# Patient Record
Sex: Male | Born: 1950
Health system: Southern US, Community
[De-identification: ages and names within clinical notes are randomized; demographics above are authoritative.]

## PROBLEM LIST (undated history)

## (undated) ENCOUNTER — Inpatient Hospital Stay: Payer: Self-pay | Admitting: Internal Medicine

## (undated) DIAGNOSIS — C61 Malignant neoplasm of prostate: Secondary | ICD-10-CM

## (undated) DIAGNOSIS — K5730 Diverticulosis of large intestine without perforation or abscess without bleeding: Secondary | ICD-10-CM

## (undated) DIAGNOSIS — C801 Malignant (primary) neoplasm, unspecified: Secondary | ICD-10-CM

## (undated) DIAGNOSIS — Z8719 Personal history of other diseases of the digestive system: Secondary | ICD-10-CM

## (undated) DIAGNOSIS — R600 Localized edema: Secondary | ICD-10-CM

## (undated) DIAGNOSIS — R1310 Dysphagia, unspecified: Secondary | ICD-10-CM

## (undated) DIAGNOSIS — Z87442 Personal history of urinary calculi: Secondary | ICD-10-CM

## (undated) DIAGNOSIS — R0600 Dyspnea, unspecified: Secondary | ICD-10-CM

## (undated) DIAGNOSIS — K829 Disease of gallbladder, unspecified: Secondary | ICD-10-CM

## (undated) DIAGNOSIS — Z86010 Personal history of colonic polyps: Secondary | ICD-10-CM

## (undated) DIAGNOSIS — G4730 Sleep apnea, unspecified: Secondary | ICD-10-CM

## (undated) DIAGNOSIS — K219 Gastro-esophageal reflux disease without esophagitis: Secondary | ICD-10-CM

## (undated) HISTORY — DX: Personal history of urinary calculi: Z87.442

## (undated) HISTORY — DX: Gastro-esophageal reflux disease without esophagitis: K21.9

## (undated) HISTORY — DX: Personal history of other diseases of the digestive system: Z87.19

## (undated) HISTORY — DX: Localized edema: R60.0

## (undated) HISTORY — DX: Malignant (primary) neoplasm, unspecified: C80.1

## (undated) HISTORY — DX: Diverticulosis of large intestine without perforation or abscess without bleeding: K57.30

## (undated) HISTORY — DX: Disease of gallbladder, unspecified: K82.9

## (undated) HISTORY — DX: Malignant neoplasm of prostate: C61

## (undated) HISTORY — DX: Sleep apnea, unspecified: G47.30

## (undated) HISTORY — DX: Dysphagia, unspecified: R13.10

## (undated) HISTORY — DX: Personal history of colonic polyps: Z86.010

---

## 1985-09-28 HISTORY — PX: APPENDECTOMY: SHX54

## 1985-09-28 HISTORY — PX: CHOLECYSTECTOMY: SHX55

## 2002-10-27 ENCOUNTER — Emergency Department (HOSPITAL_COMMUNITY): Admission: EM | Admit: 2002-10-27 | Discharge: 2002-10-27 | Payer: Self-pay | Admitting: Emergency Medicine

## 2002-10-27 ENCOUNTER — Encounter: Payer: Self-pay | Admitting: Emergency Medicine

## 2002-10-29 ENCOUNTER — Emergency Department (HOSPITAL_COMMUNITY): Admission: EM | Admit: 2002-10-29 | Discharge: 2002-10-29 | Payer: Self-pay | Admitting: Emergency Medicine

## 2002-11-03 ENCOUNTER — Emergency Department (HOSPITAL_COMMUNITY): Admission: EM | Admit: 2002-11-03 | Discharge: 2002-11-03 | Payer: Self-pay | Admitting: Emergency Medicine

## 2002-11-03 ENCOUNTER — Encounter: Payer: Self-pay | Admitting: Emergency Medicine

## 2004-07-28 ENCOUNTER — Ambulatory Visit (HOSPITAL_COMMUNITY): Admission: RE | Admit: 2004-07-28 | Discharge: 2004-07-28 | Payer: Self-pay | Admitting: Internal Medicine

## 2007-08-31 ENCOUNTER — Ambulatory Visit: Payer: Self-pay | Admitting: Internal Medicine

## 2007-08-31 DIAGNOSIS — Z860100 Personal history of colon polyps, unspecified: Secondary | ICD-10-CM | POA: Insufficient documentation

## 2007-08-31 DIAGNOSIS — K5730 Diverticulosis of large intestine without perforation or abscess without bleeding: Secondary | ICD-10-CM | POA: Insufficient documentation

## 2007-08-31 DIAGNOSIS — Z87442 Personal history of urinary calculi: Secondary | ICD-10-CM

## 2007-08-31 DIAGNOSIS — E7800 Pure hypercholesterolemia, unspecified: Secondary | ICD-10-CM | POA: Insufficient documentation

## 2007-08-31 DIAGNOSIS — D126 Benign neoplasm of colon, unspecified: Secondary | ICD-10-CM | POA: Insufficient documentation

## 2007-08-31 DIAGNOSIS — Z8719 Personal history of other diseases of the digestive system: Secondary | ICD-10-CM | POA: Insufficient documentation

## 2007-08-31 DIAGNOSIS — E785 Hyperlipidemia, unspecified: Secondary | ICD-10-CM

## 2007-08-31 DIAGNOSIS — G43909 Migraine, unspecified, not intractable, without status migrainosus: Secondary | ICD-10-CM

## 2007-08-31 HISTORY — DX: Personal history of urinary calculi: Z87.442

## 2007-08-31 HISTORY — DX: Personal history of other diseases of the digestive system: Z87.19

## 2007-08-31 HISTORY — DX: Personal history of colon polyps, unspecified: Z86.0100

## 2007-08-31 HISTORY — DX: Diverticulosis of large intestine without perforation or abscess without bleeding: K57.30

## 2007-08-31 HISTORY — DX: Personal history of colonic polyps: Z86.010

## 2008-11-08 ENCOUNTER — Telehealth: Payer: Self-pay | Admitting: Internal Medicine

## 2008-11-20 ENCOUNTER — Ambulatory Visit: Payer: Self-pay | Admitting: Family Medicine

## 2009-03-26 ENCOUNTER — Telehealth: Payer: Self-pay | Admitting: Family Medicine

## 2009-06-20 ENCOUNTER — Encounter (INDEPENDENT_AMBULATORY_CARE_PROVIDER_SITE_OTHER): Payer: Self-pay | Admitting: *Deleted

## 2010-02-18 ENCOUNTER — Telehealth (INDEPENDENT_AMBULATORY_CARE_PROVIDER_SITE_OTHER): Payer: Self-pay | Admitting: *Deleted

## 2010-10-28 NOTE — Progress Notes (Signed)
Summary: Schedule recall colonoscopy  Phone Note Outgoing Call Call back at cell # [phone]   Call placed by: Christie Nottingham CMA Duncan Dull),  Feb 18, 2010 11:13 AM Call placed to: Patient Summary of Call: left message for pt  to call back and schedule recall colonoscopy. Initial call taken by: Christie Nottingham CMA Duncan Dull),  Feb 18, 2010 11:13 AM  Follow-up for Phone Call        left message for pt  to call back  Follow-up by: Christie Nottingham CMA Duncan Dull),  February 26, 2010 10:40 AM

## 2012-04-12 ENCOUNTER — Encounter: Payer: Self-pay | Admitting: Internal Medicine

## 2016-09-28 LAB — HM COLONOSCOPY

## 2018-08-17 ENCOUNTER — Encounter (INDEPENDENT_AMBULATORY_CARE_PROVIDER_SITE_OTHER): Payer: Self-pay

## 2018-08-17 ENCOUNTER — Encounter: Payer: Self-pay | Admitting: Primary Care

## 2018-08-17 ENCOUNTER — Ambulatory Visit (INDEPENDENT_AMBULATORY_CARE_PROVIDER_SITE_OTHER): Payer: PPO | Admitting: Primary Care

## 2018-08-17 VITALS — BP 136/86 | HR 73 | Temp 98.2°F | Ht 59.25 in | Wt 202.2 lb

## 2018-08-17 DIAGNOSIS — E785 Hyperlipidemia, unspecified: Secondary | ICD-10-CM | POA: Diagnosis not present

## 2018-08-17 DIAGNOSIS — Z125 Encounter for screening for malignant neoplasm of prostate: Secondary | ICD-10-CM

## 2018-08-17 DIAGNOSIS — Z23 Encounter for immunization: Secondary | ICD-10-CM | POA: Diagnosis not present

## 2018-08-17 DIAGNOSIS — G43909 Migraine, unspecified, not intractable, without status migrainosus: Secondary | ICD-10-CM

## 2018-08-17 DIAGNOSIS — Z1159 Encounter for screening for other viral diseases: Secondary | ICD-10-CM

## 2018-08-17 DIAGNOSIS — Z0000 Encounter for general adult medical examination without abnormal findings: Secondary | ICD-10-CM | POA: Insufficient documentation

## 2018-08-17 LAB — LIPID PANEL
Cholesterol: 238 mg/dL — ABNORMAL HIGH (ref 0–200)
HDL: 38.9 mg/dL — ABNORMAL LOW (ref >39.00)
LDL Cholesterol: 159 mg/dL — ABNORMAL HIGH (ref 0–99)
NonHDL: 199.01
Total CHOL/HDL Ratio: 6
Triglycerides: 199 mg/dL — ABNORMAL HIGH (ref 0.0–149.0)
VLDL: 39.8 mg/dL (ref 0.0–40.0)

## 2018-08-17 LAB — COMPREHENSIVE METABOLIC PANEL
ALT: 38 U/L (ref 0–53)
AST: 20 U/L (ref 0–37)
Albumin: 4.4 g/dL (ref 3.5–5.2)
Alkaline Phosphatase: 59 U/L (ref 39–117)
BUN: 19 mg/dL (ref 6–23)
CO2: 31 mEq/L (ref 19–32)
Calcium: 9.6 mg/dL (ref 8.4–10.5)
Chloride: 102 mEq/L (ref 96–112)
Creatinine, Ser: 0.92 mg/dL (ref 0.40–1.50)
GFR: 87.76 mL/min (ref >60.00)
Glucose, Bld: 93 mg/dL (ref 70–99)
Potassium: 4.4 mEq/L (ref 3.5–5.1)
Sodium: 141 mEq/L (ref 135–145)
Total Bilirubin: 0.3 mg/dL (ref 0.2–1.2)
Total Protein: 7.1 g/dL (ref 6.0–8.3)

## 2018-08-17 LAB — TSH: TSH: 2.17 u[IU]/mL (ref 0.35–4.50)

## 2018-08-17 LAB — PSA: PSA: 11.54 ng/mL — ABNORMAL HIGH (ref 0.10–4.00)

## 2018-08-17 MED ORDER — ZOSTER VAC RECOMB ADJUVANTED 50 MCG/0.5ML IM SUSR: 0.5000 mL | Freq: Once | INTRAMUSCULAR | 1 refills | Status: AC

## 2018-08-17 MED ORDER — TETANUS-DIPHTH-ACELL PERTUSSIS 5-2-15.5 LF-MCG/0.5 IM SUSP: 0.5000 mL | Freq: Once | INTRAMUSCULAR | 0 refills | Status: AC

## 2018-08-17 NOTE — Addendum Note (Signed)
Addended by: Jacqualin Combes on: 08/17/2018 01:01 PM   Modules accepted: Orders

## 2018-08-17 NOTE — Patient Instructions (Signed)
Stop by the lab prior to leaving today. I will notify you of your results once received.   Start exercising. You should be getting 150 minutes of moderate intensity exercise weekly.  You were provided with influenza and pneumonia vaccinations today.   Take the prescription for the tetanus and shingles vaccinations to your pharmacy in one month.  It was a pleasure to meet you today! Please don't hesitate to call or message me with any questions. Welcome to [facility]!   Preventive Care 23 Years and Older, Male Preventive care refers to lifestyle choices and visits with your health care provider that can promote health and wellness. What does preventive care include?  A yearly physical exam. This is also called an annual well check.  Dental exams once or twice a year.  Routine eye exams. Ask your health care provider how often you should have your eyes checked.  Personal lifestyle choices, including: ? Daily care of your teeth and gums. ? Regular physical activity. ? Eating a healthy diet. ? Avoiding tobacco and drug use. ? Limiting alcohol use. ? Practicing safe sex. ? Taking low doses of aspirin every day. ? Taking vitamin and mineral supplements as recommended by your health care provider. What happens during an annual well check? The services and screenings done by your health care provider during your annual well check will depend on your age, overall health, lifestyle risk factors, and family history of disease. Counseling Your health care provider may ask you questions about your:  Alcohol use.  Tobacco use.  Drug use.  Emotional well-being.  Home and relationship well-being.  Sexual activity.  Eating habits.  History of falls.  Memory and ability to understand (cognition).  Work and work Statistician.  Screening You may have the following tests or measurements:  Height, weight, and BMI.  Blood pressure.  Lipid and cholesterol levels. These may be  checked every 5 years, or more frequently if you are over 65 years old.  Skin check.  Lung cancer screening. You may have this screening every year starting at age 65 if you have a 30-pack-year history of smoking and currently smoke or have quit within the past 15 years.  Fecal occult blood test (FOBT) of the stool. You may have this test every year starting at age 65.  Flexible sigmoidoscopy or colonoscopy. You may have a sigmoidoscopy every 5 years or a colonoscopy every 10 years starting at age 65.  Prostate cancer screening. Recommendations will vary depending on your family history and other risks.  Hepatitis C blood test.  Hepatitis B blood test.  Sexually transmitted disease (STD) testing.  Diabetes screening. This is done by checking your blood sugar (glucose) after you have not eaten for a while (fasting). You may have this done every 1-3 years.  Abdominal aortic aneurysm (AAA) screening. You may need this if you are a current or former smoker.  Osteoporosis. You may be screened starting at age 65 if you are at high risk.  Talk with your health care provider about your test results, treatment options, and if necessary, the need for more tests. Vaccines Your health care provider may recommend certain vaccines, such as:  Influenza vaccine. This is recommended every year.  Tetanus, diphtheria, and acellular pertussis (Tdap, Td) vaccine. You may need a Td booster every 10 years.  Varicella vaccine. You may need this if you have not been vaccinated.  Zoster vaccine. You may need this after age 65.  Measles, mumps, and rubella (MMR) vaccine.  You may need at least one dose of MMR if you were born in 1957 or later. You may also need a second dose.  Pneumococcal 13-valent conjugate (PCV13) vaccine. One dose is recommended after age 65.  Pneumococcal polysaccharide (PPSV23) vaccine. One dose is recommended after age 65.  Meningococcal vaccine. You may need this if you have  certain conditions.  Hepatitis A vaccine. You may need this if you have certain conditions or if you travel or work in places where you may be exposed to hepatitis A.  Hepatitis B vaccine. You may need this if you have certain conditions or if you travel or work in places where you may be exposed to hepatitis B.  Haemophilus influenzae type b (Hib) vaccine. You may need this if you have certain risk factors.  Talk to your health care provider about which screenings and vaccines you need and how often you need them. This information is not intended to replace advice given to you by your health care provider. Make sure you discuss any questions you have with your health care provider. Document Released: 10/11/2015 Document Revised: 06/03/2016 Document Reviewed: 07/16/2015 Elsevier Interactive Patient Education  [employer].

## 2018-08-17 NOTE — Assessment & Plan Note (Signed)
Tdap and Shingles vaccination due, Rx printed. Pneumovax provided today. Influenza vaccination provided today. PSA pending. Colonoscopy UTD. Recommended to start regular exercise, work on diet, drink water.  Exam unremarkable.  Labs pending. Follow up in 1 year for CPE.

## 2018-08-17 NOTE — Assessment & Plan Note (Signed)
Lipid panel pending today. Encouraged regular exercise, increase water intake, improve diet.

## 2018-08-17 NOTE — Assessment & Plan Note (Signed)
Intermittent, improved overall. Using Sumatriptan once every 2-3 months on average. Continue to monitor.

## 2018-08-17 NOTE — Progress Notes (Signed)
Subjective:    Patient ID: Preston Thomas, male    DOB: [birth year], 67 y.o.   MRN: [id number]  HPI  Preston Thomas is a 67 year old male who presents today to establish care and for complete physical. He's not seen a provider in years.   1) Migraines/Headaches: Diagnosed in teenage years. Currently managed on sumatriptan 100 mg for which he takes PRN. Migraines occur once every 2 months and are typically located to the left occipital and temporal regions. He will experience photophobia and phonophobia.   Immunizations: -Tetanus: Completed in 2004. -Influenza: Due today -Pneumonia: Never completed -Shingles: Never completed  Diet: He endorses a fair diet.  Breakfast: Egg/egg whites, sausage Lunch: Ensure Max Protein drinks x 2 Dinner: Meat, vegetables, little starch  Snacks: None Desserts: None Beverages: Diet soda, no water  Exercise: He is not exercising due to work.  Eye exam: Completed in 2019 Dental exam: No recent exam  Colonoscopy: Completed in 2018,  PSA: Due Hep C Screen: Due   Review of Systems  Constitutional: Negative for unexpected weight change.  HENT: Negative for rhinorrhea.   Respiratory: Negative for cough and shortness of breath.   Cardiovascular: Negative for chest pain.  Gastrointestinal: Negative for constipation and diarrhea.  Genitourinary: Negative for difficulty urinating.  Musculoskeletal: Positive for arthralgias.  Skin: Negative for rash.  Allergic/Immunologic: Negative for environmental allergies.  Neurological: Negative for dizziness, numbness and headaches.       Occasional migraines. Intermittent difficulty with memory.   Psychiatric/Behavioral: The patient is not nervous/anxious.        No past medical history on file.   Social History   Socioeconomic History  . Marital status: Married    Spouse name: Not on file  . Number of children: Not on file  . Years of education: Not on file  . Highest education level: Not on file    Occupational History  . Not on file  Social Needs  . Financial resource strain: Not on file  . Food insecurity:    Worry: Not on file    Inability: Not on file  . Transportation needs:    Medical: Not on file    Non-medical: Not on file  Tobacco Use  . Smoking status: Never Smoker  . Smokeless tobacco: Never Used  Substance and Sexual Activity  . Alcohol use: Not Currently  . Drug use: Not on file  . Sexual activity: Not on file  Lifestyle  . Physical activity:    Days per week: Not on file    Minutes per session: Not on file  . Stress: Not on file  Relationships  . Social connections:    Talks on phone: Not on file    Gets together: Not on file    Attends religious service: Not on file    Active member of club or organization: Not on file    Attends meetings of clubs or organizations: Not on file    Relationship status: Not on file  . Intimate partner violence:    Fear of current or ex partner: Not on file    Emotionally abused: Not on file    Physically abused: Not on file    Forced sexual activity: Not on file  Other Topics Concern  . Not on file  Social History Narrative  . Not on file     No family history on file.  Allergies  Allergen Reactions  . Penicillins     Current Outpatient Medications on File  Prior to Visit  Medication Sig Dispense Refill  . SUMAtriptan (IMITREX) 100 MG tablet Take 50 mg by mouth every 2 (two) hours as needed for migraine. May repeat in 2 hours if headache persists or recurs.     No current facility-administered medications on file prior to visit.     BP 136/86   Pulse 73   Temp 98.2 F (36.8 C) (Oral)   Ht 4' 11.25" (1.505 m)   Wt 202 lb 4 oz (91.7 kg)   SpO2 94%   BMI 40.51 kg/m    Objective:   Physical Exam  Constitutional: He is oriented to person, place, and time. He appears well-nourished.  HENT:  Mouth/Throat: No oropharyngeal exudate.  Eyes: Pupils are equal, round, and reactive to light. EOM are normal.   Neck: Neck supple. No thyromegaly present.  Cardiovascular: Normal rate and regular rhythm.  Respiratory: Effort normal and breath sounds normal.  GI: Soft. Bowel sounds are normal. There is no tenderness.  Musculoskeletal: Normal range of motion.  Neurological: He is alert and oriented to person, place, and time.  Skin: Skin is warm and dry.  Psychiatric: He has a normal mood and affect.           Assessment & Plan:

## 2018-08-18 LAB — HEPATITIS C ANTIBODY
Hepatitis C Ab: NONREACTIVE
SIGNAL TO CUT-OFF: 0.02 (ref <1.00)

## 2018-08-22 ENCOUNTER — Other Ambulatory Visit: Payer: Self-pay | Admitting: Primary Care

## 2018-08-22 DIAGNOSIS — E785 Hyperlipidemia, unspecified: Secondary | ICD-10-CM

## 2018-08-22 DIAGNOSIS — R972 Elevated prostate specific antigen [PSA]: Secondary | ICD-10-CM

## 2018-08-30 DIAGNOSIS — L400 Psoriasis vulgaris: Secondary | ICD-10-CM | POA: Diagnosis not present

## 2018-08-30 DIAGNOSIS — D2372 Other benign neoplasm of skin of left lower limb, including hip: Secondary | ICD-10-CM | POA: Diagnosis not present

## 2018-09-13 ENCOUNTER — Encounter: Payer: Self-pay | Admitting: Primary Care

## 2018-09-13 ENCOUNTER — Ambulatory Visit (INDEPENDENT_AMBULATORY_CARE_PROVIDER_SITE_OTHER): Payer: PPO | Admitting: Primary Care

## 2018-09-13 VITALS — BP 136/84 | HR 76 | Temp 98.6°F | Ht 59.25 in | Wt 206.5 lb

## 2018-09-13 DIAGNOSIS — K219 Gastro-esophageal reflux disease without esophagitis: Secondary | ICD-10-CM | POA: Diagnosis not present

## 2018-09-13 DIAGNOSIS — G43909 Migraine, unspecified, not intractable, without status migrainosus: Secondary | ICD-10-CM | POA: Diagnosis not present

## 2018-09-13 MED ORDER — SUMATRIPTAN SUCCINATE 100 MG PO TABS: 50.0000 mg | ORAL_TABLET | ORAL | 0 refills | Status: DC | PRN

## 2018-09-13 MED ORDER — SUMATRIPTAN SUCCINATE 100 MG PO TABS: ORAL_TABLET | ORAL | 0 refills | Status: DC

## 2018-09-13 NOTE — Patient Instructions (Signed)
Stop esomeprazole (Nexium), start omeprazole (Prilosec) 20 mg. Take once daily.  Please update me in 2 weeks.   It was a pleasure to see you today!   Food Choices for Gastroesophageal Reflux Disease, Adult When you have gastroesophageal reflux disease (GERD), the foods you eat and your eating habits are very important. Choosing the right foods can help ease your discomfort. What guidelines do I need to follow?  Choose fruits, vegetables, whole grains, and low-fat dairy products.  Choose low-fat meat, fish, and poultry.  Limit fats such as oils, salad dressings, butter, nuts, and avocado.  Keep a food diary. This helps you identify foods that cause symptoms.  Avoid foods that cause symptoms. These may be different for everyone.  Eat small meals often instead of 3 large meals a day.  Eat your meals slowly, in a place where you are relaxed.  Limit fried foods.  Cook foods using methods other than frying.  Avoid drinking alcohol.  Avoid drinking large amounts of liquids with your meals.  Avoid bending over or lying down until 2-3 hours after eating. What foods are not recommended? These are some foods and drinks that may make your symptoms worse: Vegetables Tomatoes. Tomato juice. Tomato and spaghetti sauce. Chili peppers. Onion and garlic. Horseradish. Fruits Oranges, grapefruit, and lemon (fruit and juice). Meats High-fat meats, fish, and poultry. This includes hot dogs, ribs, ham, sausage, salami, and bacon. Dairy Whole milk and chocolate milk. Sour cream. Cream. Butter. Ice cream. Cream cheese. Drinks Coffee and tea. Bubbly (carbonated) drinks or energy drinks. Condiments Hot sauce. Barbecue sauce. Sweets/Desserts Chocolate and cocoa. Donuts. Peppermint and spearmint. Fats and Oils High-fat foods. This includes [place] fries and potato chips. Other Vinegar. Strong spices. This includes black pepper, white pepper, red pepper, cayenne, curry powder, cloves, ginger,  and chili powder. The items listed above may not be a complete list of foods and drinks to avoid. Contact your dietitian for more information. This information is not intended to replace advice given to you by your health care provider. Make sure you discuss any questions you have with your health care provider. Document Released: 03/15/2012 Document Revised: 02/20/2016 Document Reviewed: 07/19/2013 Elsevier Interactive Patient Education  2017 [employer].

## 2018-09-13 NOTE — Assessment & Plan Note (Signed)
Refill provided for sumatriptan per patient request.

## 2018-09-13 NOTE — Progress Notes (Signed)
Subjective:    Patient ID: Preston Thomas, male    DOB: [birth year], 67 y.o.   MRN: [id number]  HPI  [name] is a 67 year old male who presents today with a chief complaint of feeling queasy.   He describes his symptoms as "sour, queasy", abdominal bloating, metallic taste in his mouth that began two weeks ago. His symptoms will occur 2-3 hours after every meal. He denies nausea, vomiting, diarrhea, fevers, rectal bleeding. Symptoms began 2-3 weeks ago.  Nearly two years ago he had to go to the hospital due to persistent vomiting as his "gag reflux" was triggered. He underwent endoscopy and was told that he had GERD. He underwent esophageal stretching at that time and started on omeprazole. Symptoms improved significantly.   He's been taking omeprazole 20 mg daily for the last two years. About 3-4 weeks ago his wife switched to Nexium 20 mg. He denies changes to his diet, increased stress.   Review of Systems  Constitutional: Negative for fever.  Respiratory: Negative for cough and shortness of breath.   Cardiovascular: Negative for chest pain.  Gastrointestinal: Negative for blood in stool, constipation, diarrhea and nausea.       Abdominal bloating, metallic taste in mouth  Neurological: Negative for dizziness.       Past Medical History:  Diagnosis Date  . COLONIC POLYPS, HX OF 08/31/2007   Qualifier: Diagnosis of  By: Silvio Pate MD, [facility]   . DIVERTICULITIS, HX OF 08/31/2007   Qualifier: Diagnosis of  By: Joesph Fillers CMA (AAMA), Ronny Bacon    . DIVERTICULOSIS, COLON 08/31/2007   Qualifier: Diagnosis of  By: Silvio Pate MD, [facility]   . RENAL CALCULUS, HX OF 08/31/2007   Qualifier: Diagnosis of  By: Joesph Fillers CMA (AAMA), Natasha       Social History   Socioeconomic History  . Marital status: Married    Spouse name: Not on file  . Number of children: Not on file  . Years of education: Not on file  . Highest education level: Not on file  Occupational History  . Not  on file  Social Needs  . Financial resource strain: Not on file  . Food insecurity:    Worry: Not on file    Inability: Not on file  . Transportation needs:    Medical: Not on file    Non-medical: Not on file  Tobacco Use  . Smoking status: Never Smoker  . Smokeless tobacco: Never Used  Substance and Sexual Activity  . Alcohol use: Not Currently  . Drug use: Not on file  . Sexual activity: Not on file  Lifestyle  . Physical activity:    Days per week: Not on file    Minutes per session: Not on file  . Stress: Not on file  Relationships  . Social connections:    Talks on phone: Not on file    Gets together: Not on file    Attends religious service: Not on file    Active member of club or organization: Not on file    Attends meetings of clubs or organizations: Not on file    Relationship status: Not on file  . Intimate partner violence:    Fear of current or ex partner: Not on file    Emotionally abused: Not on file    Physically abused: Not on file    Forced sexual activity: Not on file  Other Topics Concern  . Not on file  Social History Narrative  .  Not on file    Past Surgical History:  Procedure Laterality Date  . APPENDECTOMY  1987  . CHOLECYSTECTOMY  1987    Family History  Problem Relation Age of Onset  . Early death Father   . Kidney disease Father     Allergies  Allergen Reactions  . Penicillins     Current Outpatient Medications on File Prior to Visit  Medication Sig Dispense Refill  . SUMAtriptan (IMITREX) 100 MG tablet Take 50 mg by mouth every 2 (two) hours as needed for migraine. May repeat in 2 hours if headache persists or recurs.     No current facility-administered medications on file prior to visit.     BP 136/84   Pulse 76   Temp 98.6 F (37 C) (Oral)   Ht 4' 11.25" (1.505 m)   Wt 206 lb 8 oz (93.7 kg)   SpO2 96%   BMI 41.36 kg/m    Objective:   Physical Exam  Constitutional: He appears well-nourished.  Neck: Neck  supple.  Cardiovascular: Normal rate and regular rhythm.  Respiratory: Effort normal and breath sounds normal.  GI: Soft. Bowel sounds are normal. There is no abdominal tenderness.  Skin: Skin is warm and dry.           Assessment & Plan:

## 2018-09-13 NOTE — Assessment & Plan Note (Signed)
Symptoms today represent GERD. No evidence for ACS or infectious cause. Suspect his symptoms were aggravated given the switch from omeprazole to Nexium. Will have him switch back to omeprazole and update in 2 weeks. No alarm signs.

## 2018-09-16 ENCOUNTER — Ambulatory Visit: Payer: PPO | Admitting: Primary Care

## 2018-09-19 ENCOUNTER — Ambulatory Visit: Payer: Self-pay | Admitting: Urology

## 2018-10-10 ENCOUNTER — Ambulatory Visit: Payer: PPO | Admitting: Urology

## 2018-10-10 ENCOUNTER — Encounter: Payer: Self-pay | Admitting: Urology

## 2018-10-10 VITALS — BP 138/81 | HR 87 | Ht 59.0 in | Wt 209.2 lb

## 2018-10-10 DIAGNOSIS — R972 Elevated prostate specific antigen [PSA]: Secondary | ICD-10-CM

## 2018-10-10 MED ORDER — TAMSULOSIN HCL 0.4 MG PO CAPS: 0.4000 mg | ORAL_CAPSULE | Freq: Every day | ORAL | 0 refills | Status: DC

## 2018-10-10 NOTE — Progress Notes (Signed)
10/10/2018 4:05 PM   Preston Thomas Aug 16, 1951 [id number]  Referring provider: Pleas [name], NP [place], [place] [address]  Chief Complaint  Patient presents with  . Establish Care  . Elevated PSA    HPI: 68 year old male seen at the request of Alma Friendly for evaluation of an elevated PSA.  A PSA drawn on 08/17/2018 was elevated at 11.54.  There are no previous PSA results for comparison.  The patient states he had a PSA approximately 4-5 years ago which was 4.  He has no bothersome lower urinary tract symptoms.  IPSS was 2/1.  Denies dysuria, gross hematuria or flank/abdominal/pelvic/scrotal pain.  He denies previous history of urologic problems or family history of prostate cancer.   PMH: Past Medical History:  Diagnosis Date  . COLONIC POLYPS, HX OF 08/31/2007   Qualifier: Diagnosis of  By: Silvio Pate MD, [facility]   . DIVERTICULITIS, HX OF 08/31/2007   Qualifier: Diagnosis of  By: Joesph Fillers CMA (AAMA), Ronny Bacon    . DIVERTICULOSIS, COLON 08/31/2007   Qualifier: Diagnosis of  By: Silvio Pate MD, [facility]   . GERD (gastroesophageal reflux disease)   . RENAL CALCULUS, HX OF 08/31/2007   Qualifier: Diagnosis of  By: Joesph Fillers CMA (AAMA), Natasha      Surgical History: Past Surgical History:  Procedure Laterality Date  . APPENDECTOMY  1987  . CHOLECYSTECTOMY  1987    Home Medications:  Allergies as of 10/10/2018      Reactions   Penicillins       Medication List       Accurate as of October 10, 2018  4:05 PM. Always use your most recent med list.        PRILOSEC 20 MG capsule Generic drug:  omeprazole Take 20 mg by mouth daily.   SUMAtriptan 100 MG tablet Commonly known as:  IMITREX Take 1 tablet by mouth at migraine onset. May repeat in 2 hours if headache persists or recurs. Do not exceed 100 mg in 24 hours.       Allergies:  Allergies  Allergen Reactions  . Penicillins     Family History: Family History  Problem  Relation Age of Onset  . Early death Father   . Kidney disease Father     Social History:  reports that he quit smoking about 34 years ago. His smoking use included cigarettes. He quit after 10.00 years of use. He has never used smokeless tobacco. He reports previous alcohol use. He reports that he does not use drugs.  ROS: UROLOGY Frequent Urination?: No Hard to postpone urination?: No Burning/pain with urination?: No Get up at night to urinate?: No Leakage of urine?: No Urine stream starts and stops?: No Trouble starting stream?: No Do you have to strain to urinate?: No Blood in urine?: No Urinary tract infection?: No Sexually transmitted disease?: No Injury to kidneys or bladder?: No Painful intercourse?: No Weak stream?: No Erection problems?: No Penile pain?: No  Gastrointestinal Nausea?: No Vomiting?: No Indigestion/heartburn?: Yes Diarrhea?: No Constipation?: No  Constitutional Fever: No Night sweats?: No Weight loss?: No Fatigue?: No  Skin Skin rash/lesions?: No Itching?: No  Eyes Blurred vision?: No Double vision?: No  Ears/Nose/Throat Sore throat?: No Sinus problems?: No  Hematologic/Lymphatic Swollen glands?: No Easy bruising?: No  Cardiovascular Leg swelling?: No Chest pain?: No  Respiratory Cough?: No Shortness of breath?: No  Endocrine Excessive thirst?: No  Musculoskeletal Back pain?: No Joint pain?: No  Neurological Headaches?: No  Dizziness?: No  Psychologic Depression?: No Anxiety?: No  Physical Exam: BP 138/81 (BP Location: Left Arm, Patient Position: Sitting, Cuff Size: Large)   Pulse 87   Ht 4\' 11"  (1.499 m)   Wt 209 lb 3.2 oz (94.9 kg)   BMI 42.25 kg/m   Constitutional:  Alert and oriented, No acute distress. HEENT: [place] AT, moist mucus membranes.  Trachea midline, no masses. Cardiovascular: No clubbing, cyanosis, or edema. Respiratory: Normal respiratory effort, no increased work of breathing. GI: Abdomen is  soft, nontender, nondistended, no abdominal masses GU: No CVA tenderness.  Penis circumcised without lesions.  Testes descended bilaterally without masses or tenderness, cord/epididymis palpably normal bilaterally.  Prostate 30 g, smooth without nodules. Lymph: No cervical or inguinal lymphadenopathy. Skin: No rashes, bruises or suspicious lesions. Neurologic: Grossly intact, no focal deficits, moving all 4 extremities. Psychiatric: Normal mood and affect.   Assessment & Plan:   68 year old male with an elevated PSA.  Although PSA is a prostate cancer screening test he was informed that cancer is not the most common cause of an elevated PSA. Other potential causes including BPH and inflammation were discussed. He was informed that the only way to adequately diagnose prostate cancer would be a transrectal ultrasound and biopsy of the prostate. The procedure was discussed including potential risks of bleeding and infection/sepsis. He was also informed that a negative biopsy does not conclusively rule out the possibility that prostate cancer may be present and that continued monitoring is required. The use of newer adjunctive blood tests including PHI and 4kScore were discussed. The use of multiparametric prostate MRI was also discussed however is not typically used for initial evaluation of an elevated PSA. Continued periodic surveillance was also discussed.  I have initially recommended a 30-day alpha-blocker course with a repeat PSA and urinalysis in 1 month.  If PSA is persistently elevated would recommend scheduling TRUS/biopsy of the prostate.   Abbie Sons, [place] [address] [place], [place] [address] [phone]

## 2018-11-10 ENCOUNTER — Other Ambulatory Visit: Payer: PPO

## 2018-11-11 ENCOUNTER — Other Ambulatory Visit: Payer: PPO

## 2018-11-11 DIAGNOSIS — R972 Elevated prostate specific antigen [PSA]: Secondary | ICD-10-CM

## 2018-11-11 LAB — URINALYSIS, COMPLETE
Bilirubin, UA: NEGATIVE
Glucose, UA: NEGATIVE
Ketones, UA: NEGATIVE
Leukocytes, UA: NEGATIVE
Nitrite, UA: NEGATIVE
Protein, UA: NEGATIVE
RBC, UA: NEGATIVE
Specific Gravity, UA: 1.025 (ref 1.005–1.030)
Urobilinogen, Ur: 0.2 mg/dL (ref 0.2–1.0)
pH, UA: 5 (ref 5.0–7.5)

## 2018-11-12 LAB — PSA: Prostate Specific Ag, Serum: 12.7 ng/mL — ABNORMAL HIGH (ref 0.0–4.0)

## 2018-11-14 ENCOUNTER — Telehealth: Payer: Self-pay

## 2018-11-14 NOTE — Telephone Encounter (Signed)
-----   Message from Abbie Sons, MD sent at 11/14/2018  8:45 AM EST ----- Repeat PSA remains elevated at 12.7.  Recommend scheduling prostate biopsy.

## 2018-11-14 NOTE — Telephone Encounter (Signed)
Mychart sent.

## 2018-11-15 NOTE — Telephone Encounter (Signed)
Spoke with patient and apps were made  [facility] over instructions with the patient over the phone and sent via My chart   Sharyn Lull

## 2018-11-25 ENCOUNTER — Encounter: Payer: Self-pay | Admitting: Urology

## 2018-11-25 ENCOUNTER — Other Ambulatory Visit: Payer: Self-pay | Admitting: Urology

## 2018-11-25 ENCOUNTER — Ambulatory Visit: Payer: PPO | Admitting: Urology

## 2018-11-25 VITALS — BP 148/81 | HR 92 | Ht 59.0 in | Wt 209.0 lb

## 2018-11-25 DIAGNOSIS — R972 Elevated prostate specific antigen [PSA]: Secondary | ICD-10-CM | POA: Diagnosis not present

## 2018-11-25 DIAGNOSIS — C61 Malignant neoplasm of prostate: Secondary | ICD-10-CM | POA: Diagnosis not present

## 2018-11-25 MED ORDER — GENTAMICIN SULFATE 40 MG/ML IJ SOLN: 80.0000 mg | Freq: Once | INTRAMUSCULAR | Status: DC

## 2018-11-25 MED ORDER — LEVOFLOXACIN 500 MG PO TABS: 500.0000 mg | ORAL_TABLET | Freq: Once | ORAL | Status: DC

## 2018-11-25 NOTE — Patient Instructions (Signed)
  Transrectal Prostate Biopsy Patient Education and Post Procedure Instructions    -Definition A prostate biopsy is the removal of a small amount of tissue from the prostate gland. The tissue is examined to determine whether there is cancer.  -Reasons for Procedure A prostate biopsy is usually done after an abnormal finding by: Digital rectal exam Prostate specific antigen (PSA) blood test A prostate biopsy is the only way to find out if cancer cells are present.  -Possible Complications Problems from the procedure are rare, but all procedures have some risk including: Infection Bruising or lengthy bleeding from the rectum, or in urine or semen Difficulty urinating Reactions to anesthesia Factors that may increase the risk of complications include: Smoking History of bleeding disorders or easy bruising Use of any medications, over-the-counter medications, or herbal supplements Sensitivity or allergy to latex, medications, or anesthesia.  -Prior to Procedure Talk to your doctor about your medications. Blood thinning medications including aspirin should be stopped 1 week prior to procedure. If prescribed by your cardiologist we may need approval before stopping medications. Use a Fleets enema 2 hours before the procedure. Can be purchased at your pharmacy. Antibiotics will be administered in the clinic prior to procedure.  Please make sure you eat a light meal prior to coming in for your appointment. This can help prevent lightheadedness during the procedure and upset stomach from antibiotics. Please bring someone with you to the procedure to drive you home.  -Anesthesia Transrectal biopsy: Local anesthesia--Just the area that is being operated on is numbed using an injectable anesthetic.  -Description of the Procedure Transrectal biopsy--Your doctor will insert a small ultrasound device into the rectum. This device will produce sound waves to create an image of the prostate.  These images will help guide placement of the needle. Your doctor will then insert the needle through the wall of the rectum and into the prostate gland. The procedure should take approximately 15-30 minutes.  -Will It Hurt? You may have discomfort and soreness at the biopsy site. Pain and discomfort after the procedure can be managed with medications.  -Postoperative Care When you return home after the procedure, do the following to help ensure a smooth recovery: Stay hydrated. Drink plenty of fluids for the next few days. Avoid difficult physical activity the day and evening of the procedure. Keep in mind that you may see blood in your urine, stool, or semen for several days. Resume any medications that were stopped when you are advised to do so.  After the sample is taken, it will be sent to a pathologist for examination under a microscope. This doctor will analyze the sample for cancer. You will be scheduled for an appointment to discuss results. If cancer is present, your doctor will work with you to develop a treatment plan.   -Call Your Doctor or Seek Immediate Medical Attention It is important to monitor your recovery. Alert your doctor to any problems. If any of the following occur, call your doctor or go to the emergency room: Fever 100.5 or greater within 1 week post procedure go directly to ER Call the office for: Blood in the urine more than 1 week or in semen for more than 6 weeks post-biopsy Pain that you cannot control with the medications you have been given Pain, burning, urgency, or frequency of urination Cough, shortness of breath, or chest pain- if severe go to ER Heavy rectal bleeding or bleeding that lasts more than 1 week after the biopsy If you   have any questions or concerns please contact our office at ([facility] [address], [place], [place] [address] [phone]      Transrectal Ultrasound-Guided  Prostate Biopsy, Care After This sheet gives you information about how to care for yourself after your procedure. Your doctor may also give you more specific instructions. If you have problems or questions, contact your doctor. What can I expect after the procedure? After the procedure, it is common to have:  Pain and discomfort in your butt, especially while sitting.  Pink-colored pee (urine), due to small amounts of blood in the pee.  Burning while peeing (urinating).  Blood in your poop (stool).  Bleeding from your butt.  Blood in your semen. Follow these instructions at home: Medicines  Take over-the-counter and prescription medicines only as told by your doctor.  If you were prescribed antibiotic medicine, take it as told by your doctor. Do not stop taking the antibiotic even if you start to feel better. Activity   Do not drive for 24 hours if you were given a medicine to help you relax (sedative) during your procedure.  Return to your normal activities as told by your doctor. Ask your doctor what activities are safe for you.  Ask your doctor when it is okay for you to have sex.  Do not lift anything that is heavier than 10 lb (4.5 kg), or the limit that you are told, until your doctor says that it is safe. General instructions   Drink enough water to keep your pee pale yellow.  Watch your pee, poop, and semen for new bleeding or bleeding that gets worse.  Keep all follow-up visits as told by your doctor. This is important. Contact a doctor if you:  Have blood clots in your pee or poop.  Notice that your pee smells bad or unusual.  Have very bad belly pain.  Have trouble peeing.  Notice that your lower belly feels firm.  Have blood in your pee for more than 2 weeks after the procedure.  Have blood in your semen for more than 2 months after the procedure.  Have problems getting an erection.  Feel sick to your stomach (nauseous).  Throw up  (vomit).  Have new or worse bleeding in your pee, poop, or semen. Get help right away if you:  Have a fever or chills.  Have bright red pee.  Have very bad pain that does not get better with medicine.  Cannot pee. Summary  After this procedure, it is common to have pain and discomfort around your butt, especially while sitting.  You may have blood in your pee and poop.  It is common to have blood in your semen for 1-2 months.  If you were prescribed antibiotic medicine, take it as told by your doctor. Do not stop taking the antibiotic even if you start to feel better.  Get help right away if you have a fever or chills. This information is not intended to replace advice given to you by your health care provider. Make sure you discuss any questions you have with your health care provider. Document Released: 07/13/2017 Document Revised: 07/13/2017 Document Reviewed: 07/13/2017 Elsevier Interactive Patient Education  2019 [employer].

## 2018-11-25 NOTE — Progress Notes (Signed)
Prostate Biopsy Procedure   Informed consent was obtained after discussing risks/benefits of the procedure.  A time out was performed to ensure correct patient identity.  Pre-Procedure: - Last PSA Level:  Lab Results  Component Value Date   PSA 11.54 (H) 08/17/2018  Repeat PSA 2/14 was 12.7  - Gentamicin given prophylactically - Levaquin 500 mg administered PO -Transrectal Ultrasound performed revealing a 28 gm prostate -No significant hypoechoic or median lobe noted  Procedure: - Prostate block performed using 10 cc 1% lidocaine and biopsies taken from sextant areas, a total of 12 under ultrasound guidance.  Post-Procedure: - Patient tolerated the procedure well - He was counseled to seek immediate medical attention if experiences any severe pain, significant bleeding, or fevers - Return in one week to discuss biopsy results   Preston Giovanni, MD

## 2018-11-30 ENCOUNTER — Other Ambulatory Visit: Payer: Self-pay | Admitting: Urology

## 2018-11-30 LAB — PATHOLOGY REPORT

## 2018-12-06 ENCOUNTER — Telehealth: Payer: Self-pay | Admitting: Urology

## 2018-12-06 DIAGNOSIS — C61 Malignant neoplasm of prostate: Secondary | ICD-10-CM

## 2018-12-06 NOTE — Telephone Encounter (Signed)
[name] was contacted with his prostate biopsy results.  He had no post biopsy complaints.  He had multiple course 10/12+ for adenocarcinoma ranging from Gleason 3+3 to Gleason 4+5.  He has high risk disease.  I recommended staging evaluation to include CT abdomen/pelvis and bone scan.  Will schedule follow-up appointment for results and discussion of treatment options.  I have entered orders for bone scan and CT.  Please schedule follow-up appointment to discuss results and treatment options.

## 2018-12-09 ENCOUNTER — Ambulatory Visit: Payer: PPO | Admitting: Urology

## 2018-12-14 ENCOUNTER — Telehealth: Payer: Self-pay | Admitting: Urology

## 2018-12-15 NOTE — Telephone Encounter (Signed)
Unfortunately I do not know that we would be able to get it done any earlier.  I can go ahead and put in a referral to radiation oncology to get him plugged in there if you would like.

## 2018-12-15 NOTE — Telephone Encounter (Signed)
Pt called to make you aware that his imagining was pushed out til 4/24.  He wants to make sure this is O.K. to wait.  Pt is very nervous.

## 2018-12-16 ENCOUNTER — Ambulatory Visit: Payer: PPO

## 2018-12-16 ENCOUNTER — Other Ambulatory Visit: Payer: PPO

## 2018-12-16 NOTE — Telephone Encounter (Signed)
Spoke to patient and he is ok to wait.

## 2019-01-03 ENCOUNTER — Telehealth: Payer: Self-pay | Admitting: Urology

## 2019-01-03 NOTE — Telephone Encounter (Signed)
I can do a virtual visit

## 2019-01-03 NOTE — Telephone Encounter (Signed)
OK I spoke with him and he is good with that and will be available for the virtual app   Sharyn Lull

## 2019-01-03 NOTE — Telephone Encounter (Signed)
He has been scheduled for his bone and CT scan for 01-20-19 his results app is 01-24-19 unless you decided to call him. Just let me know.   Sharyn Lull

## 2019-01-20 ENCOUNTER — Encounter
Admission: RE | Admit: 2019-01-20 | Discharge: 2019-01-20 | Disposition: A | Discharge status: Home / Self Care | Payer: PPO | Source: Ambulatory Visit | Attending: Urology | Admitting: Urology

## 2019-01-20 ENCOUNTER — Other Ambulatory Visit: Payer: Self-pay

## 2019-01-20 ENCOUNTER — Ambulatory Visit
Admission: RE | Admit: 2019-01-20 | Discharge: 2019-01-20 | Disposition: A | Discharge status: Home / Self Care | Payer: PPO | Source: Ambulatory Visit | Attending: Urology | Admitting: Urology

## 2019-01-20 ENCOUNTER — Inpatient Hospital Stay: Admission: RE | Admit: 2019-01-20 | Payer: PPO | Source: Ambulatory Visit

## 2019-01-20 DIAGNOSIS — C61 Malignant neoplasm of prostate: Secondary | ICD-10-CM

## 2019-01-20 LAB — POCT I-STAT CREATININE: Creatinine, Ser: 1.1 mg/dL (ref 0.61–1.24)

## 2019-01-20 MED ORDER — IOHEXOL 300 MG/ML  SOLN
100.0000 mL | Freq: Once | INTRAMUSCULAR | Status: AC | PRN
  Administered 2019-01-20: 100 mL via INTRAVENOUS

## 2019-01-20 MED ORDER — TECHNETIUM TC 99M MEDRONATE IV KIT
20.0000 | PACK | Freq: Once | INTRAVENOUS | Status: AC | PRN
  Administered 2019-01-20: 09:00:00 23.09 via INTRAVENOUS

## 2019-01-24 ENCOUNTER — Ambulatory Visit: Payer: PPO | Admitting: Urology

## 2019-01-25 ENCOUNTER — Other Ambulatory Visit: Payer: Self-pay

## 2019-01-25 ENCOUNTER — Telehealth (INDEPENDENT_AMBULATORY_CARE_PROVIDER_SITE_OTHER): Payer: PPO | Admitting: Urology

## 2019-01-25 DIAGNOSIS — C61 Malignant neoplasm of prostate: Secondary | ICD-10-CM | POA: Diagnosis not present

## 2019-01-25 NOTE — Progress Notes (Signed)
Virtual Visit via Video Note  I connected with Preston Thomas on 01/25/19 at 10:30 AM EDT by a video enabled telemedicine application and verified that I am speaking with the correct person using two identifiers.   I discussed the limitations of evaluation and management by telemedicine and the availability of in person appointments. The patient expressed understanding and agreed to proceed.  History of Present Illness: 68 year old male recently diagnosed with high risk prostate cancer who recently completed a metastatic evaluation.  A telehealth visit is performed today secondary to COVID-19 pandemic.  He has high-volume T1c high risk prostate cancer.  PSA was 12.7; prostate volume 28 g  Pathology: 10/12 cores positive for adenocarcinoma ranging Gleason 3+3 to Gleason 4+5.  CT of the abdomen and pelvis and bone scan were performed on 01/20/2019.  CT showed no findings suspicious for extracapsular extension and no pelvic adenopathy.  Bone scan shows no evidence of bony metastatic disease.   Observations/Objective: Alert, in no acute distress  Assessment and Plan: 68 year old male with high-volume/high risk prostate cancer.  CT and bone scan showed no area suspicious for metastatic disease.  Based on [facility] nomograms the probability of extracapsular extension is 88.4%  Radical prostatectomy was discussed as a curative treatment however he was informed of the high risk of extracapsular extension making the chance of radical prostatectomy less likely to be curative.  Radiation therapy plus ADT was also discussed both external beam and external beam plus brachytherapy.  He is more interested in pursuing radiation therapy and a referral to radiation oncology was placed.  Follow Up Instructions: Referral radiation oncology   I discussed the assessment and treatment plan with the patient. The patient was provided an opportunity to ask questions and all were answered. The patient agreed  with the plan and demonstrated an understanding of the instructions.   The patient was advised to call back or seek an in-person evaluation if the symptoms worsen or if the condition fails to improve as anticipated.  I provided 15 minutes of non-face-to-face time during this encounter.   Abbie Sons, MD

## 2019-02-02 ENCOUNTER — Other Ambulatory Visit: Payer: Self-pay

## 2019-02-02 ENCOUNTER — Ambulatory Visit
Admission: RE | Admit: 2019-02-02 | Discharge: 2019-02-02 | Disposition: A | Discharge status: Home / Self Care | Payer: PPO | Source: Ambulatory Visit | Attending: Radiation Oncology | Admitting: Radiation Oncology

## 2019-02-02 ENCOUNTER — Encounter: Payer: Self-pay | Admitting: Radiation Oncology

## 2019-02-02 VITALS — BP 145/90 | HR 86 | Temp 98.5°F | Resp 18 | Wt 217.3 lb

## 2019-02-02 DIAGNOSIS — Z86010 Personal history of colonic polyps: Secondary | ICD-10-CM | POA: Insufficient documentation

## 2019-02-02 DIAGNOSIS — K219 Gastro-esophageal reflux disease without esophagitis: Secondary | ICD-10-CM | POA: Insufficient documentation

## 2019-02-02 DIAGNOSIS — Z87891 Personal history of nicotine dependence: Secondary | ICD-10-CM | POA: Diagnosis not present

## 2019-02-02 DIAGNOSIS — C61 Malignant neoplasm of prostate: Secondary | ICD-10-CM | POA: Insufficient documentation

## 2019-02-02 DIAGNOSIS — Z8719 Personal history of other diseases of the digestive system: Secondary | ICD-10-CM | POA: Diagnosis not present

## 2019-02-02 DIAGNOSIS — Z87442 Personal history of urinary calculi: Secondary | ICD-10-CM | POA: Insufficient documentation

## 2019-02-02 NOTE — Consult Note (Signed)
NEW PATIENT EVALUATION  Name: Preston Thomas  MRN: [id number]  Date:   02/02/2019     DOB: 1950-12-27   This 68 y.o. male patient presents to the clinic for initial evaluation of stage IIb (T1 cN0 M0) Gleason 8 (3+5) adenocarcinoma the prostate presenting with a PSA of 12.7.  REFERRING PHYSICIAN: Pleas [name], NP  CHIEF COMPLAINT:  Chief Complaint  Patient presents with  . Prostate Cancer    Ref stoioff Prostate    DIAGNOSIS: The encounter diagnosis was Malignant neoplasm of prostate ([place]).   PREVIOUS INVESTIGATIONS:  CT scans and bone scan reviewed Pathology report reviewed Clinical notes reviewed  HPI: Patient is a 68 year old male who presented with a jump in his PSA to 12.7.  This prompted transrectal ultrasound-guided biopsy showing 10 of 12 cores positive for adenocarcinoma.  This was a mixture of Gleason 6 (3+3) Gleason 8 (3+5) and Gleason 9 (4+5).  There was perineural invasion involved.  Patient underwent a bone scan showing no evidence to suggest metastatic disease.  CT scan of the abdomen pelvis showed no lymphadenopathy or findings suggestive of metastatic disease.  There was a hyperenhancing right prostatic peripheral zone nodule compatible with primary malignancy.  Patient is fairly asymptomatic specifically denies nocturia urgency or frequency.  He has been seen by urology and recommendation for radiation therapy was made.  He is seen today for radiation oncology opinion.  PLANNED TREATMENT REGIMEN: Androgen deprivation therapy plus external beam treatment to prostate and pelvic nodes plus I-125 interstitial implant for boost  PAST MEDICAL HISTORY:  has a past medical history of COLONIC POLYPS, HX OF (08/31/2007), DIVERTICULITIS, HX OF (08/31/2007), DIVERTICULOSIS, COLON (08/31/2007), GERD (gastroesophageal reflux disease), and RENAL CALCULUS, HX OF (08/31/2007).    PAST SURGICAL HISTORY:  Past Surgical History:  Procedure Laterality Date  . APPENDECTOMY   1987  . CHOLECYSTECTOMY  1987    FAMILY HISTORY: family history includes Early death in his father; Kidney disease in his father.  SOCIAL HISTORY:  reports that he quit smoking about 34 years ago. His smoking use included cigarettes. He quit after 10.00 years of use. He has never used smokeless tobacco. He reports previous alcohol use. He reports that he does not use drugs.  ALLERGIES: Penicillins  MEDICATIONS:  Current Outpatient Medications  Medication Sig Dispense Refill  . omeprazole (PRILOSEC) 20 MG capsule Take 20 mg by mouth daily.    . SUMAtriptan (IMITREX) 100 MG tablet Take 1 tablet by mouth at migraine onset. May repeat in 2 hours if headache persists or recurs. Do not exceed 100 mg in 24 hours. 10 tablet 0   Current Facility-Administered Medications  Medication Dose Route Frequency Provider Last Rate Last Dose  . gentamicin (GARAMYCIN) injection 80 mg  80 mg Intramuscular Once Stoioff, Scott C, MD      . levofloxacin (LEVAQUIN) tablet 500 mg  500 mg Oral Once Stoioff, Ronda Fairly, MD        ECOG PERFORMANCE STATUS:  0 - Asymptomatic  REVIEW OF SYSTEMS: Patient denies any weight loss, fatigue, weakness, fever, chills or night sweats. Patient denies any loss of vision, blurred vision. Patient denies any ringing  of the ears or hearing loss. No irregular heartbeat. Patient denies heart murmur or history of fainting. Patient denies any chest pain or pain radiating to her upper extremities. Patient denies any shortness of breath, difficulty breathing at night, cough or hemoptysis. Patient denies any swelling in the lower legs. Patient denies any nausea vomiting, vomiting of  blood, or coffee ground material in the vomitus. Patient denies any stomach pain. Patient states has had normal bowel movements no significant constipation or diarrhea. Patient denies any dysuria, hematuria or significant nocturia. Patient denies any problems walking, swelling in the joints or loss of balance. Patient  denies any skin changes, loss of hair or loss of weight. Patient denies any excessive worrying or anxiety or significant depression. Patient denies any problems with insomnia. Patient denies excessive thirst, polyuria, polydipsia. Patient denies any swollen glands, patient denies easy bruising or easy bleeding. Patient denies any recent infections, allergies or URI. Patient "s visual fields have not changed significantly in recent time.   PHYSICAL EXAM: BP (!) 145/90 (BP Location: Left Arm, Patient Position: Sitting)   Pulse 86   Temp 98.5 F (36.9 C) (Tympanic)   Resp 18   Wt 217 lb 4.2 oz (98.5 kg)   BMI 43.88 kg/m  Well-developed well-nourished patient in NAD. HEENT reveals PERLA, EOMI, discs not visualized.  Oral cavity is clear. No oral mucosal lesions are identified. Neck is clear without evidence of cervical or supraclavicular adenopathy. Lungs are clear to A&P. Cardiac examination is essentially unremarkable with regular rate and rhythm without murmur rub or thrill. Abdomen is benign with no organomegaly or masses noted. Motor sensory and DTR levels are equal and symmetric in the upper and lower extremities. Cranial nerves II through XII are grossly intact. Proprioception is intact. No peripheral adenopathy or edema is identified. No motor or sensory levels are noted. Crude visual fields are within normal range.  LABORATORY DATA: Pathology reports reviewed    RADIOLOGY RESULTS: CT scans and bone scans reviewed and compatible with above-stated findings   IMPRESSION: Stage IIb adenocarcinoma the prostate in 68 year old male  PLAN: At this time I have recommended both androgen deprivation therapy as well as external beam radiation therapy to his prostate and pelvic nodes.  Would treat his prostate and pelvic nodes to 5000 cGy using IMRT treatment planning and delivery.  I would then boost his prostate with an I-125 interstitial implant.  Based on the COVID-19 pan epidemic if in 2 months we  are still not doing seed implantation would boost with external beam treatment.  Risks and benefits of treatment occluding increased lower urinary tract symptoms diarrhea fatigue alteration of blood counts all were discussed in detail with the patient.  I have asked for Dr. Dene Gentry office to start Lupron therapy.  I have personally set up and ordered CT simulation for next week.  Patient comprehends my treatment plan well.  I would like to take this opportunity to thank you for allowing me to participate in the care of your patient.Noreene Filbert, MD

## 2019-02-07 ENCOUNTER — Other Ambulatory Visit: Payer: Self-pay

## 2019-02-08 ENCOUNTER — Ambulatory Visit
Admission: RE | Admit: 2019-02-08 | Discharge: 2019-02-08 | Disposition: A | Discharge status: Home / Self Care | Payer: PPO | Source: Ambulatory Visit | Attending: Radiation Oncology | Admitting: Radiation Oncology

## 2019-02-08 ENCOUNTER — Other Ambulatory Visit: Payer: Self-pay

## 2019-02-08 DIAGNOSIS — K219 Gastro-esophageal reflux disease without esophagitis: Secondary | ICD-10-CM | POA: Diagnosis not present

## 2019-02-08 DIAGNOSIS — Z510 Encounter for antineoplastic radiation therapy: Secondary | ICD-10-CM | POA: Insufficient documentation

## 2019-02-08 DIAGNOSIS — C61 Malignant neoplasm of prostate: Secondary | ICD-10-CM | POA: Insufficient documentation

## 2019-02-08 DIAGNOSIS — Z87891 Personal history of nicotine dependence: Secondary | ICD-10-CM | POA: Diagnosis not present

## 2019-02-09 DIAGNOSIS — C61 Malignant neoplasm of prostate: Secondary | ICD-10-CM | POA: Diagnosis not present

## 2019-02-09 DIAGNOSIS — Z510 Encounter for antineoplastic radiation therapy: Secondary | ICD-10-CM | POA: Diagnosis not present

## 2019-02-14 ENCOUNTER — Telehealth: Payer: Self-pay | Admitting: Urology

## 2019-02-14 NOTE — Telephone Encounter (Signed)
-----   Message from Daiva Huge, RN sent at 02/06/2019  9:25 AM EDT ----- Regarding: RE: Lupron Thank you! ----- Message ----- From: Benard Halsted Sent: 02/06/2019   9:01 AM EDT To: Daiva Huge, RN Subject: RE: Lupron                                     I will work on the PA and call the patient ----- Message ----- From: Daiva Huge, RN Sent: 02/02/2019   9:50 AM EDT To: Lin Givens, RN Subject: Lupron                                         Preston Thomas is going to need begin Lupron injections.  Could you please get that set up with your office.    Thanks,   [facility]

## 2019-02-14 NOTE — Telephone Encounter (Signed)
LM FOR PT TO CB TO CONFIRM APP  NO PA REQUIRED FOR LUPRON

## 2019-02-17 ENCOUNTER — Other Ambulatory Visit: Payer: Self-pay | Admitting: *Deleted

## 2019-02-17 DIAGNOSIS — C61 Malignant neoplasm of prostate: Secondary | ICD-10-CM

## 2019-02-21 ENCOUNTER — Ambulatory Visit: Payer: PPO

## 2019-02-21 ENCOUNTER — Other Ambulatory Visit: Payer: PPO

## 2019-02-22 ENCOUNTER — Ambulatory Visit: Payer: PPO

## 2019-02-22 ENCOUNTER — Ambulatory Visit
Admission: RE | Admit: 2019-02-22 | Discharge: 2019-02-22 | Disposition: A | Discharge status: Home / Self Care | Payer: PPO | Source: Ambulatory Visit | Attending: Radiation Oncology | Admitting: Radiation Oncology

## 2019-02-22 ENCOUNTER — Other Ambulatory Visit: Payer: Self-pay

## 2019-02-23 ENCOUNTER — Ambulatory Visit
Admission: RE | Admit: 2019-02-23 | Discharge: 2019-02-23 | Disposition: A | Discharge status: Home / Self Care | Payer: PPO | Source: Ambulatory Visit | Attending: Radiation Oncology | Admitting: Radiation Oncology

## 2019-02-23 ENCOUNTER — Other Ambulatory Visit: Payer: Self-pay

## 2019-02-23 DIAGNOSIS — C61 Malignant neoplasm of prostate: Secondary | ICD-10-CM | POA: Diagnosis not present

## 2019-02-23 DIAGNOSIS — Z510 Encounter for antineoplastic radiation therapy: Secondary | ICD-10-CM | POA: Diagnosis not present

## 2019-02-24 ENCOUNTER — Ambulatory Visit
Admission: RE | Admit: 2019-02-24 | Discharge: 2019-02-24 | Disposition: A | Discharge status: Home / Self Care | Payer: PPO | Source: Ambulatory Visit | Attending: Radiation Oncology | Admitting: Radiation Oncology

## 2019-02-24 ENCOUNTER — Other Ambulatory Visit: Payer: Self-pay

## 2019-02-24 DIAGNOSIS — C61 Malignant neoplasm of prostate: Secondary | ICD-10-CM | POA: Diagnosis not present

## 2019-02-24 DIAGNOSIS — Z510 Encounter for antineoplastic radiation therapy: Secondary | ICD-10-CM | POA: Diagnosis not present

## 2019-02-27 ENCOUNTER — Ambulatory Visit: Payer: PPO | Admitting: Urology

## 2019-02-27 ENCOUNTER — Other Ambulatory Visit: Payer: Self-pay

## 2019-02-27 ENCOUNTER — Encounter: Payer: Self-pay | Admitting: Urology

## 2019-02-27 ENCOUNTER — Ambulatory Visit
Admission: RE | Admit: 2019-02-27 | Discharge: 2019-02-27 | Disposition: A | Discharge status: Home / Self Care | Payer: PPO | Source: Ambulatory Visit | Attending: Radiation Oncology | Admitting: Radiation Oncology

## 2019-02-27 VITALS — BP 143/79 | HR 88 | Ht 59.25 in | Wt 206.0 lb

## 2019-02-27 DIAGNOSIS — Z87891 Personal history of nicotine dependence: Secondary | ICD-10-CM | POA: Diagnosis not present

## 2019-02-27 DIAGNOSIS — Z510 Encounter for antineoplastic radiation therapy: Secondary | ICD-10-CM | POA: Insufficient documentation

## 2019-02-27 DIAGNOSIS — C61 Malignant neoplasm of prostate: Secondary | ICD-10-CM

## 2019-02-27 DIAGNOSIS — K219 Gastro-esophageal reflux disease without esophagitis: Secondary | ICD-10-CM | POA: Diagnosis not present

## 2019-02-27 MED ORDER — LEUPROLIDE ACETATE (6 MONTH) 45 MG IM KIT
45.0000 mg | PACK | Freq: Once | INTRAMUSCULAR | Status: AC
  Administered 2019-02-27: 45 mg via INTRAMUSCULAR

## 2019-02-27 NOTE — Patient Instructions (Signed)
Please begin taking Vitamin D 600iu daily as well as Calcium 1200iu daily while on Lupron therapy    Leuprolide injection What is this medicine? LEUPROLIDE (loo PROE lide) is a man-made hormone. It is used to treat the symptoms of prostate cancer. This medicine may also be used to treat children with early onset of puberty. It may be used for other hormonal conditions. This medicine may be used for other purposes; ask your health care provider or pharmacist if you have questions. COMMON BRAND NAME(S): Lupron What should I tell my health care provider before I take this medicine? They need to know if you have any of these conditions: -diabetes -heart disease or previous heart attack -high blood pressure -high cholesterol -pain or difficulty passing urine -spinal cord metastasis -stroke -tobacco smoker -an unusual or allergic reaction to leuprolide, benzyl alcohol, other medicines, foods, dyes, or preservatives -pregnant or trying to get pregnant -breast-feeding How should I use this medicine? This medicine is for injection under the skin or into a muscle. You will be taught how to prepare and give this medicine. Use exactly as directed. Take your medicine at regular intervals. Do not take your medicine more often than directed. It is important that you put your used needles and syringes in a special sharps container. Do not put them in a trash can. If you do not have a sharps container, call your pharmacist or healthcare provider to get one. A special MedGuide will be given to you by the pharmacist with each prescription and refill. Be sure to read this information carefully each time. Talk to your pediatrician regarding the use of this medicine in children. While this medicine may be prescribed for children as young as 8 years for selected conditions, precautions do apply. Overdosage: If you think you have taken too much of this medicine contact a poison control center or emergency room at  once. NOTE: This medicine is only for you. Do not share this medicine with others. What if I miss a dose? If you miss a dose, take it as soon as you can. If it is almost time for your next dose, take only that dose. Do not take double or extra doses. What may interact with this medicine? Do not take this medicine with any of the following medications: -chasteberry This medicine may also interact with the following medications: -herbal or dietary supplements, like black cohosh or DHEA -male hormones, like estrogens or progestins and birth control pills, patches, rings, or injections -male hormones, like testosterone This list may not describe all possible interactions. Give your health care provider a list of all the medicines, herbs, non-prescription drugs, or dietary supplements you use. Also tell them if you smoke, drink alcohol, or use illegal drugs. Some items may interact with your medicine. What should I watch for while using this medicine? Visit your doctor or health care professional for regular checks on your progress. During the first week, your symptoms may get worse, but then will improve as you continue your treatment. You may get hot flashes, increased bone pain, increased difficulty passing urine, or an aggravation of nerve symptoms. Discuss these effects with your doctor or health care professional, some of them may improve with continued use of this medicine. Male patients may experience a menstrual cycle or spotting during the first 2 months of therapy with this medicine. If this continues, contact your doctor or health care professional. What side effects may I notice from receiving this medicine? Side effects that you  should report to your doctor or health care professional as soon as possible: -allergic reactions like skin rash, itching or hives, swelling of the face, lips, or tongue -breathing problems -chest pain -depression or memory disorders -pain in your legs or  groin -pain at site where injected -severe headache -swelling of the feet and legs -visual changes -vomiting Side effects that usually do not require medical attention (report to your doctor or health care professional if they continue or are bothersome): -breast swelling or tenderness -decrease in sex drive or performance -diarrhea -hot flashes -loss of appetite -muscle, joint, or bone pains -nausea -redness or irritation at site where injected -skin problems or acne This list may not describe all possible side effects. Call your doctor for medical advice about side effects. You may report side effects to FDA at 1-800-FDA-1088. Where should I keep my medicine? Keep out of the reach of children. Store below 25 degrees C (77 degrees F). Do not freeze. Protect from light. Do not use if it is not clear or if there are particles present. Throw away any unused medicine after the expiration date. NOTE: This sheet is a summary. It may not cover all possible information. If you have questions about this medicine, talk to your doctor, pharmacist, or health care provider.  2019 Elsevier/Gold Standard (2016-05-04 10:54:35)

## 2019-02-27 NOTE — Progress Notes (Signed)
02/27/2019 10:32 AM   Preston Thomas Dec 01, 1950 [id number]  Referring provider: Pleas [name], NP [address] [place], [place] [address]  Chief Complaint  Patient presents with   Prostate Cancer   Lupron    HPI: 68 year old male with T1c high risk prostate cancer.  He has seen Dr. Baruch Gouty and has started radiation.  ADT was also recommended and he presents today for his first Lupron injection.   PMH: Past Medical History:  Diagnosis Date   COLONIC POLYPS, HX OF 08/31/2007   Qualifier: Diagnosis of  By: Silvio Pate MD, [facility]    DIVERTICULITIS, HX OF 08/31/2007   Qualifier: Diagnosis of  By: Joesph Fillers CMA (AAMA), Natasha     DIVERTICULOSIS, COLON 08/31/2007   Qualifier: Diagnosis of  By: Silvio Pate MD, [facility]    GERD (gastroesophageal reflux disease)    RENAL CALCULUS, HX OF 08/31/2007   Qualifier: Diagnosis of  By: Joesph Fillers CMA (AAMA), Natasha      Surgical History: Past Surgical History:  Procedure Laterality Date   [place] Medications:  Allergies as of 02/27/2019      Reactions   Penicillins       Medication List       Accurate as of February 27, 2019 10:32 AM. If you have any questions, ask your nurse or doctor.        PriLOSEC 20 MG capsule Generic drug:  omeprazole Take 20 mg by mouth daily.   SUMAtriptan 100 MG tablet Commonly known as:  IMITREX Take 1 tablet by mouth at migraine onset. May repeat in 2 hours if headache persists or recurs. Do not exceed 100 mg in 24 hours.       Allergies:  Allergies  Allergen Reactions   Penicillins     Family History: Family History  Problem Relation Age of Onset   Early death Father    Kidney disease Father     Social History:  reports that he quit smoking about 34 years ago. His smoking use included cigarettes. He quit after 10.00 years of use. He has never used smokeless tobacco. He reports previous alcohol use. He reports that he  does not use drugs.  ROS: UROLOGY Frequent Urination?: No Hard to postpone urination?: No Burning/pain with urination?: No Get up at night to urinate?: No Leakage of urine?: No Urine stream starts and stops?: No Trouble starting stream?: No Do you have to strain to urinate?: No Blood in urine?: No Urinary tract infection?: No Sexually transmitted disease?: No Injury to kidneys or bladder?: No Painful intercourse?: No Weak stream?: No Erection problems?: No Penile pain?: No  Gastrointestinal Nausea?: No Vomiting?: No Indigestion/heartburn?: No Diarrhea?: No Constipation?: No  Constitutional Fever: No Night sweats?: No Weight loss?: No Fatigue?: No  Skin Skin rash/lesions?: No Itching?: No  Eyes Blurred vision?: No Double vision?: No  Ears/Nose/Throat Sore throat?: No Sinus problems?: No  Hematologic/Lymphatic Swollen glands?: No Easy bruising?: No  Cardiovascular Leg swelling?: No Chest pain?: No  Respiratory Cough?: No Shortness of breath?: No  Endocrine Excessive thirst?: No  Musculoskeletal Back pain?: No Joint pain?: No  Neurological Headaches?: No Dizziness?: No  Psychologic Depression?: No Anxiety?: No  Physical Exam: BP (!) 143/79 (BP Location: Left Arm, Patient Position: Sitting, Cuff Size: Normal)    Pulse 88    Ht 4' 11.25" (1.505 m)    Wt 206 lb (93.4 kg)    BMI 41.26 kg/m  Constitutional:  Alert and oriented, No acute distress.   Assessment & Plan:    1. Prostate cancer [facility]) We discussed the rationale of ADT in combination with radiation therapy.  The most common side effect of hot flashes was discussed.  Potential side effects of tiredness, fatigue, decreased libido and ED were also discussed.  He has elected to proceed.   Return in about 6 months (around 08/29/2019) for Lupron .  Abbie Sons, [place] [address] [place], [place] [address] [phone]

## 2019-02-27 NOTE — Progress Notes (Signed)
Lupron IM Injection   Due to Prostate Cancer patient is present today for a Lupron Injection.  Medication: Lupron 6 month Dose: 45 mg  Location: right upper outer buttocks Lot: [id number] Exp: 02/27/2021  Patient tolerated well, no complications were noted  Performed by: Gordy Clement, CMA (AAMA)  Follow up: 6 mos as scheduled

## 2019-02-28 ENCOUNTER — Other Ambulatory Visit: Payer: Self-pay

## 2019-02-28 ENCOUNTER — Ambulatory Visit
Admission: RE | Admit: 2019-02-28 | Discharge: 2019-02-28 | Disposition: A | Discharge status: Home / Self Care | Payer: PPO | Source: Ambulatory Visit | Attending: Radiation Oncology | Admitting: Radiation Oncology

## 2019-02-28 DIAGNOSIS — C61 Malignant neoplasm of prostate: Secondary | ICD-10-CM | POA: Diagnosis not present

## 2019-02-28 DIAGNOSIS — Z510 Encounter for antineoplastic radiation therapy: Secondary | ICD-10-CM | POA: Diagnosis not present

## 2019-03-01 ENCOUNTER — Other Ambulatory Visit: Payer: Self-pay

## 2019-03-01 ENCOUNTER — Ambulatory Visit
Admission: RE | Admit: 2019-03-01 | Discharge: 2019-03-01 | Disposition: A | Discharge status: Home / Self Care | Payer: PPO | Source: Ambulatory Visit | Attending: Radiation Oncology | Admitting: Radiation Oncology

## 2019-03-01 ENCOUNTER — Telehealth: Payer: Self-pay | Admitting: Urology

## 2019-03-01 DIAGNOSIS — Z510 Encounter for antineoplastic radiation therapy: Secondary | ICD-10-CM | POA: Diagnosis not present

## 2019-03-01 DIAGNOSIS — C61 Malignant neoplasm of prostate: Secondary | ICD-10-CM | POA: Diagnosis not present

## 2019-03-01 NOTE — Telephone Encounter (Signed)
PA for Lupron [id number] Good for 6 patient had 1st one on 02-27-19 02-27-19 thru 05-28-19 [facility]

## 2019-03-02 ENCOUNTER — Ambulatory Visit: Payer: PPO

## 2019-03-03 ENCOUNTER — Other Ambulatory Visit: Payer: Self-pay

## 2019-03-03 ENCOUNTER — Ambulatory Visit
Admission: RE | Admit: 2019-03-03 | Discharge: 2019-03-03 | Disposition: A | Discharge status: Home / Self Care | Payer: PPO | Source: Ambulatory Visit | Attending: Radiation Oncology | Admitting: Radiation Oncology

## 2019-03-03 DIAGNOSIS — C61 Malignant neoplasm of prostate: Secondary | ICD-10-CM | POA: Diagnosis not present

## 2019-03-03 DIAGNOSIS — Z510 Encounter for antineoplastic radiation therapy: Secondary | ICD-10-CM | POA: Diagnosis not present

## 2019-03-06 ENCOUNTER — Ambulatory Visit
Admission: RE | Admit: 2019-03-06 | Discharge: 2019-03-06 | Disposition: A | Discharge status: Home / Self Care | Payer: PPO | Source: Ambulatory Visit | Attending: Radiation Oncology | Admitting: Radiation Oncology

## 2019-03-06 ENCOUNTER — Other Ambulatory Visit: Payer: Self-pay

## 2019-03-06 DIAGNOSIS — C61 Malignant neoplasm of prostate: Secondary | ICD-10-CM | POA: Diagnosis not present

## 2019-03-06 DIAGNOSIS — Z510 Encounter for antineoplastic radiation therapy: Secondary | ICD-10-CM | POA: Diagnosis not present

## 2019-03-07 ENCOUNTER — Other Ambulatory Visit: Payer: Self-pay

## 2019-03-07 ENCOUNTER — Inpatient Hospital Stay: Payer: PPO | Attending: Radiation Oncology

## 2019-03-07 ENCOUNTER — Ambulatory Visit
Admission: RE | Admit: 2019-03-07 | Discharge: 2019-03-07 | Disposition: A | Discharge status: Home / Self Care | Payer: PPO | Source: Ambulatory Visit | Attending: Radiation Oncology | Admitting: Radiation Oncology

## 2019-03-07 DIAGNOSIS — C61 Malignant neoplasm of prostate: Secondary | ICD-10-CM | POA: Insufficient documentation

## 2019-03-07 DIAGNOSIS — Z510 Encounter for antineoplastic radiation therapy: Secondary | ICD-10-CM | POA: Diagnosis not present

## 2019-03-07 LAB — CBC
HCT: 46.1 % (ref 39.0–52.0)
Hemoglobin: 14.8 g/dL (ref 13.0–17.0)
MCH: 29.8 pg (ref 26.0–34.0)
MCHC: 32.1 g/dL (ref 30.0–36.0)
MCV: 92.8 fL (ref 80.0–100.0)
Platelets: 276 10*3/uL (ref 150–400)
RBC: 4.97 MIL/uL (ref 4.22–5.81)
RDW: 13.2 % (ref 11.5–15.5)
WBC: 6.3 10*3/uL (ref 4.0–10.5)
nRBC: 0 % (ref 0.0–0.2)

## 2019-03-08 ENCOUNTER — Ambulatory Visit
Admission: RE | Admit: 2019-03-08 | Discharge: 2019-03-08 | Disposition: A | Discharge status: Home / Self Care | Payer: PPO | Source: Ambulatory Visit | Attending: Radiation Oncology | Admitting: Radiation Oncology

## 2019-03-08 ENCOUNTER — Ambulatory Visit: Payer: PPO

## 2019-03-08 ENCOUNTER — Other Ambulatory Visit: Payer: Self-pay

## 2019-03-08 DIAGNOSIS — Z510 Encounter for antineoplastic radiation therapy: Secondary | ICD-10-CM | POA: Diagnosis not present

## 2019-03-08 DIAGNOSIS — C61 Malignant neoplasm of prostate: Secondary | ICD-10-CM | POA: Diagnosis not present

## 2019-03-09 ENCOUNTER — Ambulatory Visit
Admission: RE | Admit: 2019-03-09 | Discharge: 2019-03-09 | Disposition: A | Discharge status: Home / Self Care | Payer: PPO | Source: Ambulatory Visit | Attending: Radiation Oncology | Admitting: Radiation Oncology

## 2019-03-09 ENCOUNTER — Other Ambulatory Visit: Payer: Self-pay

## 2019-03-09 DIAGNOSIS — Z510 Encounter for antineoplastic radiation therapy: Secondary | ICD-10-CM | POA: Diagnosis not present

## 2019-03-09 DIAGNOSIS — C61 Malignant neoplasm of prostate: Secondary | ICD-10-CM | POA: Diagnosis not present

## 2019-03-10 ENCOUNTER — Ambulatory Visit
Admission: RE | Admit: 2019-03-10 | Discharge: 2019-03-10 | Disposition: A | Discharge status: Home / Self Care | Payer: PPO | Source: Ambulatory Visit | Attending: Radiation Oncology | Admitting: Radiation Oncology

## 2019-03-10 ENCOUNTER — Other Ambulatory Visit: Payer: Self-pay

## 2019-03-10 DIAGNOSIS — C61 Malignant neoplasm of prostate: Secondary | ICD-10-CM | POA: Diagnosis not present

## 2019-03-10 DIAGNOSIS — Z510 Encounter for antineoplastic radiation therapy: Secondary | ICD-10-CM | POA: Diagnosis not present

## 2019-03-13 ENCOUNTER — Ambulatory Visit
Admission: RE | Admit: 2019-03-13 | Discharge: 2019-03-13 | Disposition: A | Discharge status: Home / Self Care | Payer: PPO | Source: Ambulatory Visit | Attending: Radiation Oncology | Admitting: Radiation Oncology

## 2019-03-13 ENCOUNTER — Other Ambulatory Visit: Payer: Self-pay

## 2019-03-13 DIAGNOSIS — Z510 Encounter for antineoplastic radiation therapy: Secondary | ICD-10-CM | POA: Diagnosis not present

## 2019-03-13 DIAGNOSIS — C61 Malignant neoplasm of prostate: Secondary | ICD-10-CM | POA: Diagnosis not present

## 2019-03-14 ENCOUNTER — Other Ambulatory Visit: Payer: Self-pay

## 2019-03-14 ENCOUNTER — Ambulatory Visit
Admission: RE | Admit: 2019-03-14 | Discharge: 2019-03-14 | Disposition: A | Discharge status: Home / Self Care | Payer: PPO | Source: Ambulatory Visit | Attending: Radiation Oncology | Admitting: Radiation Oncology

## 2019-03-14 DIAGNOSIS — Z510 Encounter for antineoplastic radiation therapy: Secondary | ICD-10-CM | POA: Diagnosis not present

## 2019-03-14 DIAGNOSIS — C61 Malignant neoplasm of prostate: Secondary | ICD-10-CM | POA: Diagnosis not present

## 2019-03-15 ENCOUNTER — Other Ambulatory Visit: Payer: Self-pay

## 2019-03-15 ENCOUNTER — Ambulatory Visit
Admission: RE | Admit: 2019-03-15 | Discharge: 2019-03-15 | Disposition: A | Discharge status: Home / Self Care | Payer: PPO | Source: Ambulatory Visit | Attending: Radiation Oncology | Admitting: Radiation Oncology

## 2019-03-15 DIAGNOSIS — Z510 Encounter for antineoplastic radiation therapy: Secondary | ICD-10-CM | POA: Diagnosis not present

## 2019-03-15 DIAGNOSIS — C61 Malignant neoplasm of prostate: Secondary | ICD-10-CM | POA: Diagnosis not present

## 2019-03-16 ENCOUNTER — Ambulatory Visit
Admission: RE | Admit: 2019-03-16 | Discharge: 2019-03-16 | Disposition: A | Discharge status: Home / Self Care | Payer: PPO | Source: Ambulatory Visit | Attending: Radiation Oncology | Admitting: Radiation Oncology

## 2019-03-16 ENCOUNTER — Other Ambulatory Visit: Payer: Self-pay

## 2019-03-16 DIAGNOSIS — Z510 Encounter for antineoplastic radiation therapy: Secondary | ICD-10-CM | POA: Diagnosis not present

## 2019-03-16 DIAGNOSIS — C61 Malignant neoplasm of prostate: Secondary | ICD-10-CM | POA: Diagnosis not present

## 2019-03-17 ENCOUNTER — Ambulatory Visit
Admission: RE | Admit: 2019-03-17 | Discharge: 2019-03-17 | Disposition: A | Discharge status: Home / Self Care | Payer: PPO | Source: Ambulatory Visit | Attending: Radiation Oncology | Admitting: Radiation Oncology

## 2019-03-17 ENCOUNTER — Other Ambulatory Visit: Payer: Self-pay

## 2019-03-17 DIAGNOSIS — C61 Malignant neoplasm of prostate: Secondary | ICD-10-CM | POA: Diagnosis not present

## 2019-03-17 DIAGNOSIS — Z510 Encounter for antineoplastic radiation therapy: Secondary | ICD-10-CM | POA: Diagnosis not present

## 2019-03-20 ENCOUNTER — Ambulatory Visit
Admission: RE | Admit: 2019-03-20 | Discharge: 2019-03-20 | Disposition: A | Discharge status: Home / Self Care | Payer: PPO | Source: Ambulatory Visit | Attending: Radiation Oncology | Admitting: Radiation Oncology

## 2019-03-20 ENCOUNTER — Other Ambulatory Visit: Payer: Self-pay

## 2019-03-20 DIAGNOSIS — C61 Malignant neoplasm of prostate: Secondary | ICD-10-CM | POA: Diagnosis not present

## 2019-03-20 DIAGNOSIS — Z510 Encounter for antineoplastic radiation therapy: Secondary | ICD-10-CM | POA: Diagnosis not present

## 2019-03-21 ENCOUNTER — Inpatient Hospital Stay: Payer: PPO

## 2019-03-21 ENCOUNTER — Other Ambulatory Visit: Payer: Self-pay

## 2019-03-21 ENCOUNTER — Ambulatory Visit
Admission: RE | Admit: 2019-03-21 | Discharge: 2019-03-21 | Disposition: A | Discharge status: Home / Self Care | Payer: PPO | Source: Ambulatory Visit | Attending: Radiation Oncology | Admitting: Radiation Oncology

## 2019-03-21 DIAGNOSIS — C61 Malignant neoplasm of prostate: Secondary | ICD-10-CM

## 2019-03-21 DIAGNOSIS — Z510 Encounter for antineoplastic radiation therapy: Secondary | ICD-10-CM | POA: Diagnosis not present

## 2019-03-21 LAB — CBC
HCT: 45.8 % (ref 39.0–52.0)
Hemoglobin: 14.9 g/dL (ref 13.0–17.0)
MCH: 30.1 pg (ref 26.0–34.0)
MCHC: 32.5 g/dL (ref 30.0–36.0)
MCV: 92.5 fL (ref 80.0–100.0)
Platelets: 206 10*3/uL (ref 150–400)
RBC: 4.95 MIL/uL (ref 4.22–5.81)
RDW: 13.5 % (ref 11.5–15.5)
WBC: 6.1 10*3/uL (ref 4.0–10.5)
nRBC: 0 % (ref 0.0–0.2)

## 2019-03-22 ENCOUNTER — Other Ambulatory Visit: Payer: Self-pay

## 2019-03-22 ENCOUNTER — Ambulatory Visit
Admission: RE | Admit: 2019-03-22 | Discharge: 2019-03-22 | Disposition: A | Discharge status: Home / Self Care | Payer: PPO | Source: Ambulatory Visit | Attending: Radiation Oncology | Admitting: Radiation Oncology

## 2019-03-22 DIAGNOSIS — Z510 Encounter for antineoplastic radiation therapy: Secondary | ICD-10-CM | POA: Diagnosis not present

## 2019-03-22 DIAGNOSIS — C61 Malignant neoplasm of prostate: Secondary | ICD-10-CM | POA: Diagnosis not present

## 2019-03-23 ENCOUNTER — Ambulatory Visit
Admission: RE | Admit: 2019-03-23 | Discharge: 2019-03-23 | Disposition: A | Discharge status: Home / Self Care | Payer: PPO | Source: Ambulatory Visit | Attending: Radiation Oncology | Admitting: Radiation Oncology

## 2019-03-23 ENCOUNTER — Other Ambulatory Visit: Payer: Self-pay | Admitting: Radiology

## 2019-03-23 ENCOUNTER — Other Ambulatory Visit: Payer: Self-pay

## 2019-03-23 DIAGNOSIS — C61 Malignant neoplasm of prostate: Secondary | ICD-10-CM

## 2019-03-23 DIAGNOSIS — Z510 Encounter for antineoplastic radiation therapy: Secondary | ICD-10-CM | POA: Diagnosis not present

## 2019-03-24 ENCOUNTER — Ambulatory Visit
Admission: RE | Admit: 2019-03-24 | Discharge: 2019-03-24 | Disposition: A | Discharge status: Home / Self Care | Payer: PPO | Source: Ambulatory Visit | Attending: Radiation Oncology | Admitting: Radiation Oncology

## 2019-03-24 ENCOUNTER — Other Ambulatory Visit: Payer: Self-pay

## 2019-03-24 DIAGNOSIS — Z510 Encounter for antineoplastic radiation therapy: Secondary | ICD-10-CM | POA: Diagnosis not present

## 2019-03-24 DIAGNOSIS — C61 Malignant neoplasm of prostate: Secondary | ICD-10-CM | POA: Diagnosis not present

## 2019-03-27 ENCOUNTER — Ambulatory Visit
Admission: RE | Admit: 2019-03-27 | Discharge: 2019-03-27 | Disposition: A | Discharge status: Home / Self Care | Payer: PPO | Source: Ambulatory Visit | Attending: Radiation Oncology | Admitting: Radiation Oncology

## 2019-03-27 ENCOUNTER — Other Ambulatory Visit: Payer: Self-pay

## 2019-03-27 DIAGNOSIS — C61 Malignant neoplasm of prostate: Secondary | ICD-10-CM | POA: Diagnosis not present

## 2019-03-27 DIAGNOSIS — Z510 Encounter for antineoplastic radiation therapy: Secondary | ICD-10-CM | POA: Diagnosis not present

## 2019-03-28 ENCOUNTER — Ambulatory Visit
Admission: RE | Admit: 2019-03-28 | Discharge: 2019-03-28 | Disposition: A | Discharge status: Home / Self Care | Payer: PPO | Source: Ambulatory Visit | Attending: Radiation Oncology | Admitting: Radiation Oncology

## 2019-03-28 ENCOUNTER — Other Ambulatory Visit: Payer: Self-pay

## 2019-03-28 DIAGNOSIS — C61 Malignant neoplasm of prostate: Secondary | ICD-10-CM | POA: Diagnosis not present

## 2019-03-28 DIAGNOSIS — Z510 Encounter for antineoplastic radiation therapy: Secondary | ICD-10-CM | POA: Diagnosis not present

## 2019-03-29 ENCOUNTER — Ambulatory Visit
Admission: RE | Admit: 2019-03-29 | Discharge: 2019-03-29 | Disposition: A | Discharge status: Home / Self Care | Payer: PPO | Source: Ambulatory Visit | Attending: Radiation Oncology | Admitting: Radiation Oncology

## 2019-03-29 ENCOUNTER — Other Ambulatory Visit: Payer: Self-pay

## 2019-03-29 ENCOUNTER — Ambulatory Visit: Payer: PPO

## 2019-03-29 DIAGNOSIS — Z87891 Personal history of nicotine dependence: Secondary | ICD-10-CM | POA: Insufficient documentation

## 2019-03-29 DIAGNOSIS — Z510 Encounter for antineoplastic radiation therapy: Secondary | ICD-10-CM | POA: Diagnosis not present

## 2019-03-29 DIAGNOSIS — K219 Gastro-esophageal reflux disease without esophagitis: Secondary | ICD-10-CM | POA: Diagnosis not present

## 2019-03-29 DIAGNOSIS — C61 Malignant neoplasm of prostate: Secondary | ICD-10-CM | POA: Insufficient documentation

## 2019-03-30 ENCOUNTER — Other Ambulatory Visit: Payer: Self-pay

## 2019-03-30 ENCOUNTER — Ambulatory Visit
Admission: RE | Admit: 2019-03-30 | Discharge: 2019-03-30 | Disposition: A | Discharge status: Home / Self Care | Payer: PPO | Source: Ambulatory Visit | Attending: Radiation Oncology | Admitting: Radiation Oncology

## 2019-03-30 DIAGNOSIS — Z510 Encounter for antineoplastic radiation therapy: Secondary | ICD-10-CM | POA: Diagnosis not present

## 2019-03-30 DIAGNOSIS — C61 Malignant neoplasm of prostate: Secondary | ICD-10-CM | POA: Diagnosis not present

## 2019-04-10 ENCOUNTER — Telehealth: Payer: Self-pay

## 2019-04-10 NOTE — Telephone Encounter (Signed)
Pt just finished radiation for prostate cancer. Last night runny nose started running heavily, prod cough with  Clear phlegm; this morning at 9:30 temp was 101 ; takinig Tylenol and 3:45 pm temp 99. Loss of energy this morning. Pt feels better this aternoon than did this mornng. pt has a lot of drainage at back of throat; No chills,S/T,SOB,muscle pain, diarrhea ,H/A or loss of taste/smell; no travel and no known exposure to covid. Pt scheduled virtual visit with Dr Einar Pheasant on 04/11/19 at 9:20. pts father in law lives with pt who is 24 and went to a dinner or reunion and had a cough for short period but no known covid exposure to pt or father in law. Pt wants to know if should be concerned after finishing radiation with temp of 101 this morning. Pt will discuss with Dr Einar Pheasant  At virtual appt. FYI to Dr Einar Pheasant.

## 2019-04-11 ENCOUNTER — Telehealth: Payer: Self-pay | Admitting: *Deleted

## 2019-04-11 ENCOUNTER — Ambulatory Visit (INDEPENDENT_AMBULATORY_CARE_PROVIDER_SITE_OTHER): Payer: PPO | Admitting: Family Medicine

## 2019-04-11 ENCOUNTER — Other Ambulatory Visit: Payer: PPO

## 2019-04-11 ENCOUNTER — Encounter: Payer: Self-pay | Admitting: Family Medicine

## 2019-04-11 VITALS — Temp 98.0°F | Wt 210.0 lb

## 2019-04-11 DIAGNOSIS — Z20822 Contact with and (suspected) exposure to covid-19: Secondary | ICD-10-CM

## 2019-04-11 DIAGNOSIS — R05 Cough: Secondary | ICD-10-CM

## 2019-04-11 DIAGNOSIS — Z8546 Personal history of malignant neoplasm of prostate: Secondary | ICD-10-CM | POA: Insufficient documentation

## 2019-04-11 DIAGNOSIS — C61 Malignant neoplasm of prostate: Secondary | ICD-10-CM

## 2019-04-11 DIAGNOSIS — Z923 Personal history of irradiation: Secondary | ICD-10-CM | POA: Insufficient documentation

## 2019-04-11 DIAGNOSIS — R6889 Other general symptoms and signs: Secondary | ICD-10-CM | POA: Diagnosis not present

## 2019-04-11 HISTORY — DX: Personal history of irradiation: Z92.3

## 2019-04-11 NOTE — Telephone Encounter (Signed)
Patient had appointment with PCP via phone today and was instructed to contact our office that he has fever, cough and runny nose. When checking chart the note from PCP :  Other Visit Diagnoses    Suspected Covid-19 Virus Infection    -  Primary   Discussed OTC treatment for viral illnessGiven symptoms possible covid-19 and would recommend being tested ER precautions given Advised staying out of work and isolating until results have come back Referral to [facility] for testing made  No follow-ups on file.  Lesleigh Noe, MD     Assessment & Plan Note by Lesleigh Noe, MD at 04/11/2019 9:32 AM Author: Lesleigh Noe, MD Author Type: Physician Filed: 04/11/2019 9:33 AM  Note Status: Written Cosign: Cosign Not Required Encounter Date: 04/11/2019  Problem: Hx of radiation therapy  Editor: Lesleigh Noe, MD (Physician)

## 2019-04-11 NOTE — Assessment & Plan Note (Signed)
Fever after 5 weeks of radiation therapy. Will test for Covid given cough, but also advised reaching out to radiation provider to see if additional work-up would be warranted.

## 2019-04-11 NOTE — Telephone Encounter (Signed)
See virtual visit note from today

## 2019-04-11 NOTE — Telephone Encounter (Signed)
Spoke with patient, scheduled him for COVID 19 test at [facility] building today at 3pm.  Testing protocol reviewed.

## 2019-04-11 NOTE — Progress Notes (Signed)
I connected with Preston Thomas on 04/11/19 at  9:20 AM EDT by video and verified that I am speaking with the correct person using two identifiers.   I discussed the limitations, risks, security and privacy concerns of performing an evaluation and management service by video and the availability of in person appointments. I also discussed with the patient that there may be a patient responsible charge related to this service. The patient expressed understanding and agreed to proceed.  Patient location: Home Provider Location: [place] Participants: Lesleigh Noe and Preston Thomas   Subjective:     Preston Thomas is a 68 y.o. male presenting for Cough (runny nose, productive cough-clear phlgem, drainage, temp yesterday 101. Symptoms started suddenly.)     Cough This is a new problem. The current episode started in the past 7 days. The cough is productive of sputum. Associated symptoms include a fever (101), nasal congestion, postnasal drip and rhinorrhea. Pertinent negatives include no chest pain, ear pain, headaches, myalgias, sore throat or shortness of breath. Nothing aggravates the symptoms. Treatments tried: tylenol. The treatment provided mild relief. recent radiation treatment   Started to feel a little better Monday afternoon. Taking natural over the counter medication for cough.  Has been strict with covid precautions - avoiding crowds indoors completely Lives with father-in-law who attended an outdoor birthday party and returned with a cough   Review of Systems  Constitutional: Positive for fatigue and fever (101).  HENT: Positive for postnasal drip and rhinorrhea. Negative for ear pain and sore throat.   Respiratory: Positive for cough. Negative for shortness of breath.   Cardiovascular: Negative for chest pain.  Musculoskeletal: Negative for myalgias.  Neurological: Negative for headaches.     Social History   Tobacco  Use  Smoking Status Former Smoker  . Years: 10.00  . Types: Cigarettes  . Quit date: 09/28/1984  . Years since quitting: 34.5  Smokeless Tobacco Never Used        Objective:   BP Readings from Last 3 Encounters:  02/27/19 (!) 143/79  02/02/19 (!) 145/90  11/25/18 (!) 148/81   Wt Readings from Last 3 Encounters:  04/11/19 210 lb (95.3 kg)  02/27/19 206 lb (93.4 kg)  02/02/19 217 lb 4.2 oz (98.5 kg)    Temp 98 F (36.7 C) Comment: per patient  Wt 210 lb (95.3 kg) Comment: per patient  BMI 42.06 kg/m    Physical Exam Constitutional:      Appearance: Normal appearance. He is not ill-appearing.  HENT:     Head: Normocephalic and atraumatic.     Right Ear: External ear normal.     Left Ear: External ear normal.  Eyes:     Conjunctiva/sclera: Conjunctivae normal.  Pulmonary:     Effort: Pulmonary effort is normal. No respiratory distress.  Neurological:     Mental Status: He is alert. Mental status is at baseline.  Psychiatric:        Mood and Affect: Mood normal.        Behavior: Behavior normal.        Thought Content: Thought content normal.        Judgment: Judgment normal.            Assessment & Plan:   Problem List Items Addressed This Visit      Genitourinary   Prostate cancer ([place])     Other   Hx of radiation therapy    Fever  after 5 weeks of radiation therapy. Will test for Covid given cough, but also advised reaching out to radiation provider to see if additional work-up would be warranted.        Other Visit Diagnoses    Suspected Covid-19 Virus Infection    -  Primary     Discussed OTC treatment for viral illness Given symptoms possible covid-19 and would recommend being tested ER precautions given Advised staying out of work and isolating until results have come back Referral to [facility] for testing made     No follow-ups on file.  Lesleigh Noe, MD

## 2019-04-11 NOTE — Telephone Encounter (Signed)
-----   Message from Lesleigh Noe, MD sent at 04/11/2019  9:28 AM EDT ----- Please test for covid  Recent radiation treatment Cough + fever

## 2019-04-12 ENCOUNTER — Ambulatory Visit: Payer: PPO | Admitting: Radiation Oncology

## 2019-04-13 ENCOUNTER — Telehealth: Payer: Self-pay

## 2019-04-13 NOTE — Telephone Encounter (Signed)
Left message for patient to call back  

## 2019-04-13 NOTE — Telephone Encounter (Signed)
Patient advised and verbalized understanding 

## 2019-04-13 NOTE — Telephone Encounter (Signed)
Spoke with patient. Patient states he is doing better. Cough is gone, no headaches, no congestion, temp yesterday was 99 but this morning was 97.8. He does have some runny nose and low energy still. Patient was tested for COVID on 04/11/2019 and awaiting results.  Patient states that he is scheduled for Prostate Volume study on 04/19/2019 with Dr. Bernardo Heater. He has pre admission drive up screening tomorrow at [facility]. He is was not sure if he should still go for that tomorrow since his COVID result is not back or what to do. Should he consult with Dr .Elenor Quinones office about this?

## 2019-04-13 NOTE — Telephone Encounter (Signed)
Left message for patient to call back. Want to follow up on how he is feeling since his virtual visit with Dr. Einar Pheasant on 04/11/2019

## 2019-04-13 NOTE — Telephone Encounter (Signed)
Would advise calling Dr. Elenor Quinones office to see what they would like to do. As he already has a test pending it may not be worth going, but would defer to their office policy.

## 2019-04-14 ENCOUNTER — Other Ambulatory Visit
Admission: RE | Admit: 2019-04-14 | Discharge: 2019-04-14 | Disposition: A | Discharge status: Home / Self Care | Payer: PPO | Source: Ambulatory Visit | Attending: Urology | Admitting: Urology

## 2019-04-15 LAB — NOVEL CORONAVIRUS, NAA: SARS-CoV-2, NAA: DETECTED — AB

## 2019-04-18 ENCOUNTER — Encounter: Payer: Self-pay | Admitting: Emergency Medicine

## 2019-04-18 ENCOUNTER — Telehealth: Payer: Self-pay

## 2019-04-18 ENCOUNTER — Emergency Department: Payer: PPO

## 2019-04-18 ENCOUNTER — Ambulatory Visit: Payer: PPO | Admitting: Family Medicine

## 2019-04-18 ENCOUNTER — Other Ambulatory Visit: Payer: Self-pay

## 2019-04-18 ENCOUNTER — Emergency Department
Admission: EM | Admit: 2019-04-18 | Discharge: 2019-04-18 | Disposition: A | Discharge status: Home / Self Care | Payer: PPO | Source: Home / Self Care | Attending: Emergency Medicine | Admitting: Emergency Medicine

## 2019-04-18 DIAGNOSIS — K219 Gastro-esophageal reflux disease without esophagitis: Secondary | ICD-10-CM | POA: Diagnosis not present

## 2019-04-18 DIAGNOSIS — R05 Cough: Secondary | ICD-10-CM | POA: Diagnosis not present

## 2019-04-18 DIAGNOSIS — G43909 Migraine, unspecified, not intractable, without status migrainosus: Secondary | ICD-10-CM | POA: Diagnosis not present

## 2019-04-18 DIAGNOSIS — Z923 Personal history of irradiation: Secondary | ICD-10-CM | POA: Diagnosis not present

## 2019-04-18 DIAGNOSIS — Z79899 Other long term (current) drug therapy: Secondary | ICD-10-CM | POA: Diagnosis not present

## 2019-04-18 DIAGNOSIS — U071 COVID-19: Secondary | ICD-10-CM | POA: Insufficient documentation

## 2019-04-18 DIAGNOSIS — Z87891 Personal history of nicotine dependence: Secondary | ICD-10-CM | POA: Insufficient documentation

## 2019-04-18 DIAGNOSIS — R000 Tachycardia, unspecified: Secondary | ICD-10-CM | POA: Diagnosis not present

## 2019-04-18 DIAGNOSIS — E785 Hyperlipidemia, unspecified: Secondary | ICD-10-CM | POA: Diagnosis not present

## 2019-04-18 DIAGNOSIS — Z880 Allergy status to penicillin: Secondary | ICD-10-CM | POA: Diagnosis not present

## 2019-04-18 DIAGNOSIS — J1289 Other viral pneumonia: Secondary | ICD-10-CM | POA: Diagnosis not present

## 2019-04-18 DIAGNOSIS — R0602 Shortness of breath: Secondary | ICD-10-CM | POA: Diagnosis not present

## 2019-04-18 DIAGNOSIS — Z8546 Personal history of malignant neoplasm of prostate: Secondary | ICD-10-CM | POA: Insufficient documentation

## 2019-04-18 DIAGNOSIS — R0902 Hypoxemia: Secondary | ICD-10-CM | POA: Diagnosis not present

## 2019-04-18 MED ORDER — GUAIFENESIN ER 600 MG PO TB12: 600.0000 mg | ORAL_TABLET | Freq: Two times a day (BID) | ORAL | 0 refills | Status: AC

## 2019-04-18 NOTE — ED Triage Notes (Signed)
Says he was sent here by pcp after evisit because they want him to be examined and they cannot do this in the office because he was positive for covid.  Says he is not worse, but not getting better.

## 2019-04-18 NOTE — Telephone Encounter (Signed)
[place] Night - Client Nonclinical Telephone Record AccessNurse Client [facility] Night - Client Client Site [place] Physician Waunita Schooner- MD Contact Type Call Who Is Calling Patient / Member / Family / Caregiver Caller Name [place] Phone Number [phone] Patient Name Preston Thomas Patient DOB [birth year] Call Type Message Only Information Provided Reason for Call Request to Schedule Office Appointment Initial Comment Caller has tested positive for Covid, has congestion and is very weak and not getting any better. Declined triage. Additional Comment Call Closed By: Silvano Rusk Transaction Date/Time: 04/18/2019 7:06:21 AM (ET)

## 2019-04-18 NOTE — Telephone Encounter (Signed)
Thank you :)

## 2019-04-18 NOTE — Telephone Encounter (Signed)
Pt notified as instructed and pt said he would go to [facility] walk in. Pt appreciative and pt will call ahead and let [facility] know he has positive covid test before going to clinic. Appt with Dr Einar Pheasant cancelled.FYI to Dr Einar Pheasant and Gentry Fitz NP.

## 2019-04-18 NOTE — Telephone Encounter (Signed)
Per Dr. Einar Pheasant patient needs to have in person visit since he is concerned and not doing better, patient needs to proceed to urgent care since we can not do in person visit for his symptoms.

## 2019-04-18 NOTE — Telephone Encounter (Signed)
Pt called back andk [facility] could not see pt due to + covid. I called [facility] ED traige and spoke with [facility] and made her aware pt was coming over for eval. Celeste voiced understanding and said that was fine for pt to come over. Pt will wear mask as well. FYI to Dr Einar Pheasant and Gentry Fitz NP.

## 2019-04-18 NOTE — ED Provider Notes (Signed)
[facility] Emergency Department Provider Note  Time seen: 10:58 AM  I have reviewed the triage vital signs and the nursing notes.   HISTORY  Chief Complaint Cough and Fever  HPI Preston Thomas is a 68 y.o. male with a past medical history of prostate cancer status post radiation, gastric reflux, hyperlipidemia, presents to the emergency department for continued cough and fever.  According to the patient he was diagnosed with coronavirus recently, this is his eighth day of symptoms.  Patient had an ED visit by his doctor today he was complaining of continued fatigue continued low-grade fevers with occasional cough.  Patient denies any significant shortness of breath.  Patient states his doctor wanted him to go to the ER to be evaluated in person.  Currently the patient appears well, sitting in a chair, speaking in full and complete sentences.  No acute distress.   Past Medical History:  Diagnosis Date  . COLONIC POLYPS, HX OF 08/31/2007   Qualifier: Diagnosis of  By: Silvio Pate MD, [facility]   . DIVERTICULITIS, HX OF 08/31/2007   Qualifier: Diagnosis of  By: Joesph Fillers CMA (AAMA), Ronny Bacon    . DIVERTICULOSIS, COLON 08/31/2007   Qualifier: Diagnosis of  By: Silvio Pate MD, [facility]   . GERD (gastroesophageal reflux disease)   . RENAL CALCULUS, HX OF 08/31/2007   Qualifier: Diagnosis of  By: Joesph Fillers CMA (AAMA), Natasha      Patient Active Problem List   Diagnosis Date Noted  . Prostate cancer ([place]) 04/11/2019  . Hx of radiation therapy 04/11/2019  . GERD (gastroesophageal reflux disease) 09/13/2018  . Preventative health care 08/17/2018  . Hyperlipidemia 08/31/2007  . Migraines 08/31/2007    Past Surgical History:  Procedure Laterality Date  . APPENDECTOMY  1987  . CHOLECYSTECTOMY  1987    Prior to Admission medications   Medication Sig Start Date End Date Taking? Authorizing Provider  acetaminophen (TYLENOL) 500 MG tablet Take 1,000 mg by mouth every  8 (eight) hours as needed for moderate pain.    [provider]  Cholecalciferol (DIALYVITE VITAMIN D 5000) 125 MCG (5000 UT) capsule Take 5,000 Units by mouth daily.    [provider]  fluocinonide (LIDEX) 0.05 % external solution Apply 1 application topically daily as needed (eczema).    [provider]  loratadine (CLARITIN) 10 MG tablet Take 10 mg by mouth daily as needed for allergies.    [provider]  omeprazole (PRILOSEC) 20 MG capsule Take 20 mg by mouth daily.    [provider]  SUMAtriptan (IMITREX) 100 MG tablet Take 1 tablet by mouth at migraine onset. May repeat in 2 hours if headache persists or recurs. Do not exceed 100 mg in 24 hours. 09/13/18   Pleas [name], NP    Allergies  Allergen Reactions  . Penicillins     Passed out Did it involve swelling of the face/tongue/throat, SOB, or low BP? Yes Did it involve sudden or severe rash/hives, skin peeling, or any reaction on the inside of your mouth or nose? No Did you need to seek medical attention at a hospital or doctor's office? No When did it last happen?30 + years If all above answers are "NO", may proceed with cephalosporin use.     Family History  Problem Relation Age of Onset  . Early death Father   . Kidney disease Father     Social History Social History   Tobacco Use  . Smoking status: Former Smoker  Years: 10.00    Types: Cigarettes    Quit date: 09/28/1984    Years since quitting: 34.5  . Smokeless tobacco: Never Used  Substance Use Topics  . Alcohol use: Not Currently  . Drug use: Never    Review of Systems Constitutional: Low-grade fevers ENT: Positive for recent congestion/sore throat Cardiovascular: Negative for chest pain. Respiratory: Negative for shortness of breath.  Positive for mild cough Gastrointestinal: Negative for abdominal pain Musculoskeletal: Negative for musculoskeletal complaints Neurological: Negative for  headache All other ROS negative  ____________________________________________   PHYSICAL EXAM:  VITAL SIGNS: ED Triage Vitals  Enc Vitals Group     BP 04/18/19 1024 121/79     Pulse Rate 04/18/19 1024 (!) 102     Resp 04/18/19 1024 18     Temp 04/18/19 1024 98.7 F (37.1 C)     Temp src --      SpO2 04/18/19 1024 92 %     Weight 04/18/19 1031 210 lb (95.3 kg)     Height --      Head Circumference --      Peak Flow --      Pain Score 04/18/19 1024 0     Pain Loc --      Pain Edu? --      Excl. in [place]? --    Constitutional: Alert and oriented. Well appearing and in no distress. Eyes: Normal exam ENT      Head: Normocephalic and atraumatic.      Mouth/Throat: Mucous membranes are moist. Cardiovascular: Normal rate, regular rhythm.  Respiratory: Normal respiratory effort without tachypnea nor retractions. Breath sounds are clear Gastrointestinal: Soft and nontender. No distention.   Musculoskeletal: Nontender with normal range of motion in all extremities Neurologic:  Normal speech and language. No gross focal neurologic deficits  Skin:  Skin is warm, dry and intact.  Psychiatric: Mood and affect are normal.   ____________________________________________    RADIOLOGY  Bibasilar atelectasis  ____________________________________________   INITIAL IMPRESSION / ASSESSMENT AND PLAN / ED COURSE  Pertinent labs & imaging results that were available during my care of the patient were reviewed by me and considered in my medical decision making (see chart for details).   Patient presents to the emergency department for continued low-grade fever occasional cough continued fatigue patient is on day 8 of symptoms recently diagnosed with coronavirus.  Overall the patient appears well, currently satting 93 to 97% on room air, stays around 95% throughout my evaluation.  Patient's chest x-ray appears clear.  Patient is speaking in full and complete sentences, no acute distress.   Denies any chest pain.  Denies any noticeable shortness of breath per patient.  As the patient does not require oxygen and otherwise appears well I believe the patient is safe for discharge with continued supportive care at home.  Patient agreeable to plan of care.  We will place the patient on guaifenesin.  X-ray appears well.  Patient appears well.  We will discharge home with PCP follow-up.  Preston Thomas was evaluated in Emergency Department on 04/18/2019 for the symptoms described in the history of present illness. He was evaluated in the context of the global COVID-19 pandemic, which necessitated consideration that the patient might be at risk for infection with the SARS-CoV-2 virus that causes COVID-19. Institutional protocols and algorithms that pertain to the evaluation of patients at risk for COVID-19 are in a state of rapid change based on information released by regulatory bodies including the  CDC and federal and [employer]. These policies and algorithms were followed during the patient's care in the ED.  ____________________________________________   FINAL CLINICAL IMPRESSION(S) / ED DIAGNOSES  COVID-19   Harvest Dark, MD 04/18/19 1133

## 2019-04-18 NOTE — Telephone Encounter (Signed)
I spoke with pt; the prod cough has thick grey phlegm; pt can eat and drink; no taste and no appetite; pt is very weak. Temp averages 100. Pt request virtual visit; pt can function but pt is not getting any better. Pt had a friend who died from covid and pt is concerned about himself. Pt scheduled virtual visit today at 9:40 with Dr Einar Pheasant.

## 2019-04-19 ENCOUNTER — Ambulatory Visit: Payer: PPO

## 2019-04-19 ENCOUNTER — Other Ambulatory Visit: Payer: Self-pay

## 2019-04-19 ENCOUNTER — Encounter: Payer: Self-pay | Admitting: Emergency Medicine

## 2019-04-19 ENCOUNTER — Inpatient Hospital Stay
Admission: EM | Admit: 2019-04-19 | Discharge: 2019-04-20 | DRG: 177 | Disposition: A | Discharge status: Other Acute Inpatient Hospital | Payer: PPO | Attending: Internal Medicine | Admitting: Internal Medicine

## 2019-04-19 ENCOUNTER — Ambulatory Visit: Payer: PPO | Admitting: Radiation Oncology

## 2019-04-19 ENCOUNTER — Emergency Department: Payer: PPO

## 2019-04-19 DIAGNOSIS — J9601 Acute respiratory failure with hypoxia: Secondary | ICD-10-CM

## 2019-04-19 DIAGNOSIS — Z79899 Other long term (current) drug therapy: Secondary | ICD-10-CM

## 2019-04-19 DIAGNOSIS — Z87891 Personal history of nicotine dependence: Secondary | ICD-10-CM | POA: Diagnosis not present

## 2019-04-19 DIAGNOSIS — K219 Gastro-esophageal reflux disease without esophagitis: Secondary | ICD-10-CM | POA: Diagnosis present

## 2019-04-19 DIAGNOSIS — Z8546 Personal history of malignant neoplasm of prostate: Secondary | ICD-10-CM | POA: Diagnosis not present

## 2019-04-19 DIAGNOSIS — R0902 Hypoxemia: Secondary | ICD-10-CM | POA: Diagnosis present

## 2019-04-19 DIAGNOSIS — E785 Hyperlipidemia, unspecified: Secondary | ICD-10-CM | POA: Diagnosis present

## 2019-04-19 DIAGNOSIS — U071 COVID-19: Principal | ICD-10-CM | POA: Diagnosis present

## 2019-04-19 DIAGNOSIS — J1282 Pneumonia due to coronavirus disease 2019: Secondary | ICD-10-CM | POA: Diagnosis present

## 2019-04-19 DIAGNOSIS — J1289 Other viral pneumonia: Secondary | ICD-10-CM | POA: Diagnosis present

## 2019-04-19 DIAGNOSIS — Z880 Allergy status to penicillin: Secondary | ICD-10-CM | POA: Diagnosis not present

## 2019-04-19 DIAGNOSIS — G43909 Migraine, unspecified, not intractable, without status migrainosus: Secondary | ICD-10-CM | POA: Diagnosis present

## 2019-04-19 DIAGNOSIS — Z923 Personal history of irradiation: Secondary | ICD-10-CM | POA: Diagnosis not present

## 2019-04-19 DIAGNOSIS — R0602 Shortness of breath: Secondary | ICD-10-CM

## 2019-04-19 HISTORY — DX: COVID-19: U07.1

## 2019-04-19 LAB — CBC WITH DIFFERENTIAL/PLATELET
Abs Immature Granulocytes: 0.25 10*3/uL — ABNORMAL HIGH (ref 0.00–0.07)
Basophils Absolute: 0 10*3/uL (ref 0.0–0.1)
Basophils Relative: 0 %
Eosinophils Absolute: 0 10*3/uL (ref 0.0–0.5)
Eosinophils Relative: 0 %
HCT: 46.2 % (ref 39.0–52.0)
Hemoglobin: 14.9 g/dL (ref 13.0–17.0)
Immature Granulocytes: 2 %
Lymphocytes Relative: 6 %
Lymphs Abs: 0.6 10*3/uL — ABNORMAL LOW (ref 0.7–4.0)
MCH: 30 pg (ref 26.0–34.0)
MCHC: 32.3 g/dL (ref 30.0–36.0)
MCV: 93.1 fL (ref 80.0–100.0)
Monocytes Absolute: 0.8 10*3/uL (ref 0.1–1.0)
Monocytes Relative: 7 %
Neutro Abs: 8.6 10*3/uL — ABNORMAL HIGH (ref 1.7–7.7)
Neutrophils Relative %: 85 %
Platelets: 331 10*3/uL (ref 150–400)
RBC: 4.96 MIL/uL (ref 4.22–5.81)
RDW: 14.4 % (ref 11.5–15.5)
WBC: 10.2 10*3/uL (ref 4.0–10.5)
nRBC: 0 % (ref 0.0–0.2)

## 2019-04-19 LAB — PROTIME-INR
INR: 1 (ref 0.8–1.2)
Prothrombin Time: 13.1 seconds (ref 11.4–15.2)

## 2019-04-19 LAB — COMPREHENSIVE METABOLIC PANEL
ALT: 59 U/L — ABNORMAL HIGH (ref 0–44)
AST: 37 U/L (ref 15–41)
Albumin: 3.6 g/dL (ref 3.5–5.0)
Alkaline Phosphatase: 78 U/L (ref 38–126)
Anion gap: 12 (ref 5–15)
BUN: 17 mg/dL (ref 8–23)
CO2: 24 mmol/L (ref 22–32)
Calcium: 8.7 mg/dL — ABNORMAL LOW (ref 8.9–10.3)
Chloride: 105 mmol/L (ref 98–111)
Creatinine, Ser: 1.02 mg/dL (ref 0.61–1.24)
GFR calc Af Amer: >60 mL/min (ref >60)
GFR calc non Af Amer: >60 mL/min (ref >60)
Glucose, Bld: 113 mg/dL — ABNORMAL HIGH (ref 70–99)
Potassium: 3.8 mmol/L (ref 3.5–5.1)
Sodium: 141 mmol/L (ref 135–145)
Total Bilirubin: 0.7 mg/dL (ref 0.3–1.2)
Total Protein: 8.1 g/dL (ref 6.5–8.1)

## 2019-04-19 LAB — LACTIC ACID, PLASMA: Lactic Acid, Venous: 1.5 mmol/L (ref 0.5–1.9)

## 2019-04-19 LAB — PROCALCITONIN: Procalcitonin: 0.37 ng/mL

## 2019-04-19 MED ORDER — SODIUM CHLORIDE 0.9 % IV BOLUS
1000.0000 mL | Freq: Once | INTRAVENOUS | Status: AC
  Administered 2019-04-19: 1000 mL via INTRAVENOUS

## 2019-04-19 MED ORDER — DEXAMETHASONE SODIUM PHOSPHATE 10 MG/ML IJ SOLN
10.0000 mg | Freq: Once | INTRAMUSCULAR | Status: AC
  Administered 2019-04-19: 10 mg via INTRAVENOUS
  Filled 2019-04-19: qty 1

## 2019-04-19 MED ORDER — ACETAMINOPHEN 500 MG PO TABS
1000.0000 mg | ORAL_TABLET | Freq: Once | ORAL | Status: AC
  Administered 2019-04-19: 1000 mg via ORAL
  Filled 2019-04-19: qty 2

## 2019-04-19 NOTE — ED Provider Notes (Signed)
[facility] Emergency Department Provider Note   ____________________________________________   I have reviewed the triage vital signs and the nursing notes.   HISTORY  Chief Complaint Shortness of Breath   History limited by: Not Limited   HPI Preston Thomas is a 68 y.o. male who presents to the emergency department today because of concern for shortness of breath. Patient is known covid positive. States that after being seen yesterday his shortness of breath got worse. He was not able to sleep because of the shortness of breath. Also having weakness. Also having fevers. Denies history of lung disease but does have distant history of cigarette use.    Records reviewed. Per medical record review patient has a history of COVID positive, ER visit yesterday.   Past Medical History:  Diagnosis Date  . COLONIC POLYPS, HX OF 08/31/2007   Qualifier: Diagnosis of  By: Silvio Pate MD, Baird Cancer   . DIVERTICULITIS, HX OF 08/31/2007   Qualifier: Diagnosis of  By: Joesph Fillers CMA (AAMA), Ronny Bacon    . DIVERTICULOSIS, COLON 08/31/2007   Qualifier: Diagnosis of  By: Silvio Pate MD, Baird Cancer   . GERD (gastroesophageal reflux disease)   . RENAL CALCULUS, HX OF 08/31/2007   Qualifier: Diagnosis of  By: Joesph Fillers CMA (AAMA), Natasha      Patient Active Problem List   Diagnosis Date Noted  . Prostate cancer ([place]) 04/11/2019  . Hx of radiation therapy 04/11/2019  . GERD (gastroesophageal reflux disease) 09/13/2018  . Preventative health care 08/17/2018  . Hyperlipidemia 08/31/2007  . Migraines 08/31/2007    Past Surgical History:  Procedure Laterality Date  . APPENDECTOMY  1987  . CHOLECYSTECTOMY  1987    Prior to Admission medications   Medication Sig Start Date End Date Taking? Authorizing Provider  acetaminophen (TYLENOL) 500 MG tablet Take 1,000 mg by mouth every 8 (eight) hours as needed for moderate pain.    [provider]  Cholecalciferol  (DIALYVITE VITAMIN D 5000) 125 MCG (5000 UT) capsule Take 5,000 Units by mouth daily.    [provider]  guaiFENesin (MUCINEX) 600 MG 12 hr tablet Take 1 tablet (600 mg total) by mouth 2 (two) times daily for 10 days. 04/18/19 04/28/19  Harvest Dark, MD  loratadine (CLARITIN) 10 MG tablet Take 10 mg by mouth daily as needed for allergies.    [provider]  omeprazole (PRILOSEC) 20 MG capsule Take 20 mg by mouth daily.    [provider]  SUMAtriptan (IMITREX) 100 MG tablet Take 1 tablet by mouth at migraine onset. May repeat in 2 hours if headache persists or recurs. Do not exceed 100 mg in 24 hours. 09/13/18   Pleas [name], NP  vitamin C (ASCORBIC ACID) 250 MG tablet Take 250 mg by mouth daily.    [provider]    Allergies Penicillins  Family History  Problem Relation Age of Onset  . Early death Father   . Kidney disease Father     Social History Social History   Tobacco Use  . Smoking status: Former Smoker    Years: 10.00    Types: Cigarettes    Quit date: 09/28/1984    Years since quitting: 34.5  . Smokeless tobacco: Never Used  Substance Use Topics  . Alcohol use: Not Currently  . Drug use: Never    Review of Systems Constitutional: Positive for fevers.  Eyes: No visual changes. ENT: No sore throat. Cardiovascular: Denies chest pain. Respiratory: Positive for shortness of  breath. Gastrointestinal: No abdominal pain.  No nausea, no vomiting.  No diarrhea.   Genitourinary: Negative for dysuria. Musculoskeletal: Negative for back pain. Skin: Negative for rash. Neurological: Negative for headaches, focal weakness or numbness.  ____________________________________________   PHYSICAL EXAM:  VITAL SIGNS: ED Triage Vitals  Enc Vitals Group     BP 04/19/19 1721 (!) 156/89     Pulse Rate 04/19/19 1721 (!) 129     Resp 04/19/19 1721 (!) 26     Temp 04/19/19 1717 (!) 103.1 F (39.5 C)     Temp Source 04/19/19 1717 Oral      SpO2 04/19/19 1721 (!) 89 %     Weight 04/19/19 1722 205 lb (93 kg)     Height 04/19/19 1722 4\' 11"  (1.499 m)     Head Circumference --      Peak Flow --      Pain Score 04/19/19 1721 6   Constitutional: Alert and oriented.  Eyes: Conjunctivae are normal.  ENT      Head: Normocephalic and atraumatic.      Nose: No congestion/rhinnorhea.      Mouth/Throat: Mucous membranes are moist.      Neck: No stridor. Hematological/Lymphatic/Immunilogical: No cervical lymphadenopathy. Cardiovascular: Tachycardia, regular rhythm.  No murmurs, rubs, or gallops.  Respiratory: Tachypnea. Slightly increased effort of breathing. No crackles, rhonchi or rales appreciated.  Gastrointestinal: Soft and non tender. No rebound. No guarding.  Genitourinary: Deferred Musculoskeletal: Normal range of motion in all extremities. No lower extremity edema. Neurologic:  Normal speech and language. No gross focal neurologic deficits are appreciated.  Skin:  Skin is warm, dry and intact. No rash noted. Psychiatric: Mood and affect are normal. Speech and behavior are normal. Patient exhibits appropriate insight and judgment.  ____________________________________________    LABS (pertinent positives/negatives)  CBC wbc 10.2, hgb 14.9, plt 331 CMP wnl except glu 113, ca 8.7, alt 59 Lactic 1.5 ____________________________________________   EKG  I, Nance Pear, attending physician, personally viewed and interpreted this EKG  EKG Time: 1800 Rate: 122 Rhythm: sinus tachycardia Axis: left axis deviation Intervals: qtc 453 QRS: narrow, q waves v1 ST changes: no st elevation Impression: abnormal ekg  ____________________________________________    RADIOLOGY  CXR Scattered atelectasis, developing opacity   ____________________________________________   PROCEDURES  Procedures  ____________________________________________   INITIAL IMPRESSION / ASSESSMENT AND PLAN / ED COURSE  Pertinent  labs & imaging results that were available during my care of the patient were reviewed by me and considered in my medical decision making (see chart for details).   Patient presented to the emergency department with worsening shortness of breath in the setting of recent COVID positive test. Patient was hypoxic here to the mid/high 80s. Patient did well on [place] oxygen. Discussed with patient need for admission.   ____________________________________________   FINAL CLINICAL IMPRESSION(S) / ED DIAGNOSES  Final diagnoses:  Hypoxia  Shortness of breath  COVID-19     Note: This dictation was prepared with Dragon dictation. Any transcriptional errors that result from this process are unintentional     Nance Pear, MD 04/20/19 1520

## 2019-04-19 NOTE — ED Notes (Signed)
Per Dr. Archie Balboa pt taken off of O2 at this time.  Pt with O2 sats 89-90% on RA

## 2019-04-19 NOTE — ED Notes (Signed)
PT placed back on 2L  at this time.

## 2019-04-19 NOTE — ED Notes (Signed)
Warm blanket and beverage given to pt.

## 2019-04-19 NOTE — ED Notes (Addendum)
ED TO INPATIENT HANDOFF REPORT  ED Nurse Name and Phone #: Karena Addison [phone]  S Name/Age/Gender Preston Thomas 68 y.o. male Room/Bed: ED05A/ED05A  Code Status   Code Status: Not on file  Home/SNF/Other Home Patient oriented to: self, place, time and situation Is this baseline? Yes   Triage Complete: Triage complete  Chief Complaint fever/chest tightness  Triage Note Pt tested postive for Covid this past Tuesday with increasing SOB.   Allergies Allergies  Allergen Reactions  . Penicillins     Passed out Did it involve swelling of the face/tongue/throat, SOB, or low BP? Yes Did it involve sudden or severe rash/hives, skin peeling, or any reaction on the inside of your mouth or nose? No Did you need to seek medical attention at a hospital or doctor's office? No When did it last happen?30 + years If all above answers are "NO", may proceed with cephalosporin use.     Level of Care/Admitting Diagnosis ED Disposition    ED Disposition Condition Comment   Admit  The patient appears reasonably stabilized for admission considering the current resources, flow, and capabilities available in the ED at this time, and I doubt any other [facility] requiring further screening and/or treatment in the ED prior to admission is  present.       B Medical/Surgery History Past Medical History:  Diagnosis Date  . COLONIC POLYPS, HX OF 08/31/2007   Qualifier: Diagnosis of  By: Silvio Pate MD, Baird Cancer   . DIVERTICULITIS, HX OF 08/31/2007   Qualifier: Diagnosis of  By: Joesph Fillers CMA (AAMA), Ronny Bacon    . DIVERTICULOSIS, COLON 08/31/2007   Qualifier: Diagnosis of  By: Silvio Pate MD, Baird Cancer   . GERD (gastroesophageal reflux disease)   . RENAL CALCULUS, HX OF 08/31/2007   Qualifier: Diagnosis of  By: Joesph Fillers CMA (AAMA), Ronny Bacon     Past Surgical History:  Procedure Laterality Date  . APPENDECTOMY  1987  . CHOLECYSTECTOMY  1987     A IV Location/Drains/Wounds Patient  Lines/Drains/Airways Status   Active Line/Drains/Airways    Name:   Placement date:   Placement time:   Site:   Days:   Peripheral IV 04/19/19 Right Antecubital   04/19/19    1743    Antecubital   less than 1   Peripheral IV 04/19/19 Left Antecubital   04/19/19    1744    Antecubital   less than 1          Intake/Output Last 24 hours No intake or output data in the 24 hours ending 04/19/19 2050  Labs/Imaging Results for orders placed or performed during the hospital encounter of 04/19/19 (from the past 48 hour(s))  Comprehensive metabolic panel     Status: Abnormal   Collection Time: 04/19/19  5:46 PM  Result Value Ref Range   Sodium 141 135 - 145 mmol/L   Potassium 3.8 3.5 - 5.1 mmol/L   Chloride 105 98 - 111 mmol/L   CO2 24 22 - 32 mmol/L   Glucose, Bld 113 (H) 70 - 99 mg/dL   BUN 17 8 - 23 mg/dL   Creatinine, Ser 1.02 0.61 - 1.24 mg/dL   Calcium 8.7 (L) 8.9 - 10.3 mg/dL   Total Protein 8.1 6.5 - 8.1 g/dL   Albumin 3.6 3.5 - 5.0 g/dL   AST 37 15 - 41 U/L   ALT 59 (H) 0 - 44 U/L   Alkaline Phosphatase 78 38 - 126 U/L   Total Bilirubin 0.7 0.3 - 1.2  mg/dL   GFR calc non Af Amer >60 >60 mL/min   GFR calc Af Amer >60 >60 mL/min   Anion gap 12 5 - 15    Comment: Performed at [facility], [address] [place], [place] [address]  Lactic acid, plasma     Status: None   Collection Time: 04/19/19  5:46 PM  Result Value Ref Range   Lactic Acid, Venous 1.5 0.5 - 1.9 mmol/L    Comment: Performed at [facility], [address] [place], [place] [address]  CBC with Differential     Status: Abnormal   Collection Time: 04/19/19  5:46 PM  Result Value Ref Range   WBC 10.2 4.0 - 10.5 K/uL   RBC 4.96 4.22 - 5.81 MIL/uL   Hemoglobin 14.9 13.0 - 17.0 g/dL   HCT 46.2 39.0 - 52.0 %   MCV 93.1 80.0 - 100.0 fL   MCH 30.0 26.0 - 34.0 pg   MCHC 32.3 30.0 - 36.0 g/dL   RDW 14.4 11.5 - 15.5 %   Platelets 331 150 - 400 K/uL   nRBC 0.0 0.0 - 0.2 %   Neutrophils  Relative % 85 %   Neutro Abs 8.6 (H) 1.7 - 7.7 K/uL   Lymphocytes Relative 6 %   Lymphs Abs 0.6 (L) 0.7 - 4.0 K/uL   Monocytes Relative 7 %   Monocytes Absolute 0.8 0.1 - 1.0 K/uL   Eosinophils Relative 0 %   Eosinophils Absolute 0.0 0.0 - 0.5 K/uL   Basophils Relative 0 %   Basophils Absolute 0.0 0.0 - 0.1 K/uL   Immature Granulocytes 2 %   Abs Immature Granulocytes 0.25 (H) 0.00 - 0.07 K/uL    Comment: Performed at [facility], [address] [place], [place] [address]  Protime-INR     Status: None   Collection Time: 04/19/19  5:46 PM  Result Value Ref Range   Prothrombin Time 13.1 11.4 - 15.2 seconds   INR 1.0 0.8 - 1.2    Comment: (NOTE) INR goal varies based on device and disease states. Performed at [facility], [address] [place], [place] [address]    Dg Chest Portable 1 View  Result Date: 04/19/2019 CLINICAL DATA:  Continued cough and fever EXAM: PORTABLE CHEST 1 VIEW COMPARISON:  Portable exam 1732 hours compared to 04/18/2019 FINDINGS: Enlargement of cardiac silhouette. Mediastinal contours and pulmonary vascularity normal. Subsegmental atelectasis mid to lower lungs bilaterally. Increasing subtle opacity in RIGHT upper lobe suspicious for developing infiltrate. No pleural effusion or pneumothorax. Bones unremarkable. IMPRESSION: Scattered subsegmental atelectasis with question developing RIGHT upper lobe pneumonia. Electronically Signed   By: Lavonia Dana M.D.   On: 04/19/2019 18:01   Dg Chest Portable 1 View  Result Date: 04/18/2019 CLINICAL DATA:  Shortness of breath, cough.  COVID-19 positive. EXAM: PORTABLE CHEST 1 VIEW COMPARISON:  None. FINDINGS: Low lung volumes. Bibasilar atelectasis. Heart is mildly enlarged. No visible effusions or acute bony abnormality. IMPRESSION: Low lung volumes with bibasilar atelectasis. Mild cardiomegaly. Electronically Signed   By: Rolm Baptise M.D.   On: 04/18/2019 10:52    Pending Labs Unresulted Labs (From  admission, onward)    Start     Ordered   04/19/19 1737  Lactic acid, plasma  Now then every 2 hours,   STAT     04/19/19 1737   04/19/19 1737  Culture, blood (Routine x 2)  BLOOD CULTURE X 2,   STAT     04/19/19  1737   04/19/19 1737  Urinalysis, Complete w Microscopic  ONCE - STAT,   STAT     04/19/19 1737          Vitals/Pain Today's Vitals   04/19/19 1800 04/19/19 1813 04/19/19 1925 04/19/19 2000  BP: (!) 162/104 (!) 162/94 128/79 122/73  Pulse: (!) 122 (!) 125 (!) 109 (!) 105  Resp: (!) 23 (!) 22 19 (!) 25  Temp:      TempSrc:      SpO2: 95% (!) 88% 96% 96%  Weight:      Height:      PainSc:        Isolation Precautions No active isolations  Medications Medications  acetaminophen (TYLENOL) tablet 1,000 mg (1,000 mg Oral Given 04/19/19 1808)  sodium chloride 0.9 % bolus 1,000 mL (1,000 mLs Intravenous New Bag/Given 04/19/19 1809)  dexamethasone (DECADRON) injection 10 mg (10 mg Intravenous Given 04/19/19 1926)    Mobility walks Low fall risk   Focused Assessments    R Recommendations: See Admitting Provider Note  Report given to:

## 2019-04-20 ENCOUNTER — Inpatient Hospital Stay (HOSPITAL_COMMUNITY)
Admission: AD | Admit: 2019-04-20 | Discharge: 2019-04-26 | DRG: 177 | Disposition: A | Discharge status: Home / Self Care | Payer: PPO | Source: Other Acute Inpatient Hospital | Attending: Family Medicine | Admitting: Family Medicine

## 2019-04-20 ENCOUNTER — Encounter (HOSPITAL_COMMUNITY): Payer: Self-pay | Admitting: *Deleted

## 2019-04-20 DIAGNOSIS — G43909 Migraine, unspecified, not intractable, without status migrainosus: Secondary | ICD-10-CM | POA: Diagnosis not present

## 2019-04-20 DIAGNOSIS — E785 Hyperlipidemia, unspecified: Secondary | ICD-10-CM | POA: Diagnosis not present

## 2019-04-20 DIAGNOSIS — K219 Gastro-esophageal reflux disease without esophagitis: Secondary | ICD-10-CM | POA: Diagnosis present

## 2019-04-20 DIAGNOSIS — Z79899 Other long term (current) drug therapy: Secondary | ICD-10-CM | POA: Diagnosis not present

## 2019-04-20 DIAGNOSIS — Z8719 Personal history of other diseases of the digestive system: Secondary | ICD-10-CM | POA: Diagnosis not present

## 2019-04-20 DIAGNOSIS — Z87891 Personal history of nicotine dependence: Secondary | ICD-10-CM

## 2019-04-20 DIAGNOSIS — J1282 Pneumonia due to coronavirus disease 2019: Secondary | ICD-10-CM

## 2019-04-20 DIAGNOSIS — R030 Elevated blood-pressure reading, without diagnosis of hypertension: Secondary | ICD-10-CM | POA: Diagnosis present

## 2019-04-20 DIAGNOSIS — J9601 Acute respiratory failure with hypoxia: Secondary | ICD-10-CM | POA: Diagnosis present

## 2019-04-20 DIAGNOSIS — Z9981 Dependence on supplemental oxygen: Secondary | ICD-10-CM

## 2019-04-20 DIAGNOSIS — Z8546 Personal history of malignant neoplasm of prostate: Secondary | ICD-10-CM | POA: Diagnosis present

## 2019-04-20 DIAGNOSIS — E669 Obesity, unspecified: Secondary | ICD-10-CM | POA: Diagnosis not present

## 2019-04-20 DIAGNOSIS — E876 Hypokalemia: Secondary | ICD-10-CM | POA: Diagnosis not present

## 2019-04-20 DIAGNOSIS — U071 COVID-19: Secondary | ICD-10-CM | POA: Diagnosis not present

## 2019-04-20 DIAGNOSIS — C61 Malignant neoplasm of prostate: Secondary | ICD-10-CM | POA: Diagnosis not present

## 2019-04-20 DIAGNOSIS — J1289 Other viral pneumonia: Secondary | ICD-10-CM | POA: Diagnosis not present

## 2019-04-20 DIAGNOSIS — M7989 Other specified soft tissue disorders: Secondary | ICD-10-CM | POA: Diagnosis present

## 2019-04-20 DIAGNOSIS — R748 Abnormal levels of other serum enzymes: Secondary | ICD-10-CM | POA: Diagnosis not present

## 2019-04-20 DIAGNOSIS — Z923 Personal history of irradiation: Secondary | ICD-10-CM

## 2019-04-20 DIAGNOSIS — Z880 Allergy status to penicillin: Secondary | ICD-10-CM | POA: Diagnosis not present

## 2019-04-20 DIAGNOSIS — G4700 Insomnia, unspecified: Secondary | ICD-10-CM | POA: Diagnosis not present

## 2019-04-20 DIAGNOSIS — Z841 Family history of disorders of kidney and ureter: Secondary | ICD-10-CM

## 2019-04-20 DIAGNOSIS — Z6841 Body Mass Index (BMI) 40.0 and over, adult: Secondary | ICD-10-CM

## 2019-04-20 LAB — CBC WITH DIFFERENTIAL/PLATELET
Abs Immature Granulocytes: 0.35 10*3/uL — ABNORMAL HIGH (ref 0.00–0.07)
Basophils Absolute: 0 10*3/uL (ref 0.0–0.1)
Basophils Relative: 0 %
Eosinophils Absolute: 0 10*3/uL (ref 0.0–0.5)
Eosinophils Relative: 0 %
HCT: 44.3 % (ref 39.0–52.0)
Hemoglobin: 13.9 g/dL (ref 13.0–17.0)
Immature Granulocytes: 4 %
Lymphocytes Relative: 5 %
Lymphs Abs: 0.4 10*3/uL — ABNORMAL LOW (ref 0.7–4.0)
MCH: 29.7 pg (ref 26.0–34.0)
MCHC: 31.4 g/dL (ref 30.0–36.0)
MCV: 94.7 fL (ref 80.0–100.0)
Monocytes Absolute: 0.3 10*3/uL (ref 0.1–1.0)
Monocytes Relative: 3 %
Neutro Abs: 7.6 10*3/uL (ref 1.7–7.7)
Neutrophils Relative %: 88 %
Platelets: 328 10*3/uL (ref 150–400)
RBC: 4.68 MIL/uL (ref 4.22–5.81)
RDW: 14.4 % (ref 11.5–15.5)
WBC: 8.7 10*3/uL (ref 4.0–10.5)
nRBC: 0 % (ref 0.0–0.2)

## 2019-04-20 LAB — COMPREHENSIVE METABOLIC PANEL
ALT: 45 U/L — ABNORMAL HIGH (ref 0–44)
AST: 27 U/L (ref 15–41)
Albumin: 2.9 g/dL — ABNORMAL LOW (ref 3.5–5.0)
Alkaline Phosphatase: 66 U/L (ref 38–126)
Anion gap: 9 (ref 5–15)
BUN: 18 mg/dL (ref 8–23)
CO2: 28 mmol/L (ref 22–32)
Calcium: 8.2 mg/dL — ABNORMAL LOW (ref 8.9–10.3)
Chloride: 107 mmol/L (ref 98–111)
Creatinine, Ser: 0.84 mg/dL (ref 0.61–1.24)
GFR calc Af Amer: >60 mL/min (ref >60)
GFR calc non Af Amer: >60 mL/min (ref >60)
Glucose, Bld: 159 mg/dL — ABNORMAL HIGH (ref 70–99)
Potassium: 4.1 mmol/L (ref 3.5–5.1)
Sodium: 144 mmol/L (ref 135–145)
Total Bilirubin: 0.5 mg/dL (ref 0.3–1.2)
Total Protein: 7.2 g/dL (ref 6.5–8.1)

## 2019-04-20 LAB — ABO/RH: ABO/RH(D): A POS

## 2019-04-20 LAB — FERRITIN: Ferritin: 1069 ng/mL — ABNORMAL HIGH (ref 24–336)

## 2019-04-20 LAB — C-REACTIVE PROTEIN: CRP: 25.1 mg/dL — ABNORMAL HIGH (ref <1.0)

## 2019-04-20 LAB — D-DIMER, QUANTITATIVE: D-Dimer, Quant: 0.46 ug/mL-FEU (ref 0.00–0.50)

## 2019-04-20 MED ORDER — GUAIFENESIN-DM 100-10 MG/5ML PO SYRP
5.0000 mL | ORAL_SOLUTION | ORAL | Status: DC | PRN
  Filled 2019-04-20: qty 10

## 2019-04-20 MED ORDER — TRAMADOL HCL 50 MG PO TABS: 50.0000 mg | ORAL_TABLET | Freq: Four times a day (QID) | ORAL | Status: DC | PRN

## 2019-04-20 MED ORDER — ENOXAPARIN SODIUM 60 MG/0.6ML ~~LOC~~ SOLN
0.5000 mg/kg | Freq: Two times a day (BID) | SUBCUTANEOUS | Status: DC
  Administered 2019-04-20: 45 mg via SUBCUTANEOUS
  Filled 2019-04-20: qty 0.6

## 2019-04-20 MED ORDER — ONDANSETRON HCL 4 MG/2ML IJ SOLN: 4.0000 mg | Freq: Four times a day (QID) | INTRAMUSCULAR | Status: DC | PRN

## 2019-04-20 MED ORDER — ENOXAPARIN SODIUM 60 MG/0.6ML ~~LOC~~ SOLN
0.5000 mg/kg | SUBCUTANEOUS | Status: DC
  Administered 2019-04-21 – 2019-04-26 (×6): 45 mg via SUBCUTANEOUS
  Filled 2019-04-20 (×7): qty 0.6

## 2019-04-20 MED ORDER — SODIUM CHLORIDE 0.9 % IV SOLN
100.0000 mg | INTRAVENOUS | Status: DC
  Administered 2019-04-21: 100 mg via INTRAVENOUS
  Filled 2019-04-20 (×2): qty 20

## 2019-04-20 MED ORDER — SODIUM CHLORIDE 0.9 % IV SOLN
250.0000 mL | INTRAVENOUS | Status: DC | PRN
  Administered 2019-04-20: 04:00:00 250 mL via INTRAVENOUS

## 2019-04-20 MED ORDER — LEVOFLOXACIN IN D5W 750 MG/150ML IV SOLN: 750.0000 mg | INTRAVENOUS | Status: DC

## 2019-04-20 MED ORDER — DEXAMETHASONE SODIUM PHOSPHATE 10 MG/ML IJ SOLN: 10.0000 mg | INTRAMUSCULAR | Status: DC

## 2019-04-20 MED ORDER — ONDANSETRON HCL 4 MG PO TABS: 4.0000 mg | ORAL_TABLET | Freq: Four times a day (QID) | ORAL | Status: DC | PRN

## 2019-04-20 MED ORDER — SENNOSIDES-DOCUSATE SODIUM 8.6-50 MG PO TABS: 1.0000 | ORAL_TABLET | Freq: Every evening | ORAL | Status: DC | PRN

## 2019-04-20 MED ORDER — ACETAMINOPHEN 325 MG PO TABS
650.0000 mg | ORAL_TABLET | Freq: Four times a day (QID) | ORAL | Status: DC | PRN
  Administered 2019-04-21: 650 mg via ORAL
  Filled 2019-04-20: qty 2

## 2019-04-20 MED ORDER — SODIUM CHLORIDE 0.9% FLUSH
3.0000 mL | Freq: Two times a day (BID) | INTRAVENOUS | Status: DC
  Administered 2019-04-20 – 2019-04-26 (×10): 3 mL via INTRAVENOUS

## 2019-04-20 MED ORDER — SODIUM CHLORIDE 0.9% FLUSH: 3.0000 mL | INTRAVENOUS | Status: DC | PRN

## 2019-04-20 MED ORDER — DEXAMETHASONE 6 MG PO TABS
6.0000 mg | ORAL_TABLET | Freq: Every day | ORAL | Status: DC
  Administered 2019-04-20 – 2019-04-25 (×6): 6 mg via ORAL
  Filled 2019-04-20 (×6): qty 1

## 2019-04-20 MED ORDER — SODIUM CHLORIDE 0.9 % IV SOLN
200.0000 mg | Freq: Once | INTRAVENOUS | Status: AC
  Administered 2019-04-20: 200 mg via INTRAVENOUS
  Filled 2019-04-20: qty 40

## 2019-04-20 MED ORDER — IPRATROPIUM-ALBUTEROL 20-100 MCG/ACT IN AERS
1.0000 | INHALATION_SPRAY | Freq: Four times a day (QID) | RESPIRATORY_TRACT | Status: DC | PRN
  Filled 2019-04-20: qty 4

## 2019-04-20 NOTE — Assessment & Plan Note (Signed)
Stable

## 2019-04-20 NOTE — Consult Note (Addendum)
[facility] [place] NAME: Preston Thomas    MR#:  [id number]  DATE OF BIRTH:  Mar 02, 1951  DATE OF ADMISSION:  04/19/2019  PRIMARY CARE PHYSICIAN: Pleas [name], NP   REQUESTING/REFERRING PHYSICIAN: Archie Balboa, MD  CHIEF COMPLAINT:   Chief Complaint  Patient presents with  . Shortness of Breath    HISTORY OF PRESENT ILLNESS:  Preston Thomas  is a 68 y.o. male who presents with chief complaint as above.  Patient presents to the ED with known COVID-19 positive status.  He states that over the past several days he has been trying to quarantine and waiting to feel better.  He states that he simply was not feeling better.  And over the past few days began to feel worse.  He came to the ED for evaluation, and was found here to have possible superimposed pneumonia.  Hospitalist were called for consideration for admission, as [facility] originally reported being full.  Subsequently, [facility] facility stated they had a bed for him.  PAST MEDICAL HISTORY:   Past Medical History:  Diagnosis Date  . COLONIC POLYPS, HX OF 08/31/2007   Qualifier: Diagnosis of  By: Silvio Pate MD, [facility] Cancer   . DIVERTICULITIS, HX OF 08/31/2007   Qualifier: Diagnosis of  By: Joesph Fillers CMA (AAMA), Ronny Bacon    . DIVERTICULOSIS, COLON 08/31/2007   Qualifier: Diagnosis of  By: Silvio Pate MD, Baird Cancer   . GERD (gastroesophageal reflux disease)   . RENAL CALCULUS, HX OF 08/31/2007   Qualifier: Diagnosis of  By: Joesph Fillers CMA (AAMA), Ronny Bacon       PAST SURGICAL HISTORY:   Past Surgical History:  Procedure Laterality Date  . APPENDECTOMY  1987  . CHOLECYSTECTOMY  1987     SOCIAL HISTORY:   Social History   Tobacco Use  . Smoking status: Former Smoker    Years: 10.00    Types: Cigarettes    Quit date: 09/28/1984    Years since quitting: 34.5  . Smokeless tobacco: Never Used  Substance Use Topics  . Alcohol use: Not Currently     FAMILY HISTORY:    Family History  Problem Relation Age of Onset  . Early death Father   . Kidney disease Father      DRUG ALLERGIES:   Allergies  Allergen Reactions  . Penicillins     Passed out Did it involve swelling of the face/tongue/throat, SOB, or low BP? Yes Did it involve sudden or severe rash/hives, skin peeling, or any reaction on the inside of your mouth or nose? No Did you need to seek medical attention at a hospital or doctor's office? No When did it last happen?30 + years If all above answers are "NO", may proceed with cephalosporin use.     MEDICATIONS AT HOME:   Prior to Admission medications   Medication Sig Start Date End Date Taking? Authorizing Provider  acetaminophen (TYLENOL) 500 MG tablet Take 1,000 mg by mouth every 8 (eight) hours as needed for moderate pain.   Yes [provider]  Cholecalciferol (DIALYVITE VITAMIN D 5000) 125 MCG (5000 UT) capsule Take 5,000 Units by mouth daily.   Yes [provider]  guaiFENesin (MUCINEX) 600 MG 12 hr tablet Take 1 tablet (600 mg total) by mouth 2 (two) times daily for 10 days. 04/18/19 04/28/19 Yes Paduchowski, Lennette Bihari, MD  omeprazole (PRILOSEC) 20 MG capsule Take 20 mg by mouth daily.   Yes [provider]  vitamin C (  ASCORBIC ACID) 250 MG tablet Take 250 mg by mouth daily.   Yes [provider]  loratadine (CLARITIN) 10 MG tablet Take 10 mg by mouth daily as needed for allergies.    [provider]  SUMAtriptan (IMITREX) 100 MG tablet Take 1 tablet by mouth at migraine onset. May repeat in 2 hours if headache persists or recurs. Do not exceed 100 mg in 24 hours. 09/13/18   Pleas [name], NP    REVIEW OF SYSTEMS:  Review of Systems  Constitutional: Positive for fever and malaise/fatigue. Negative for chills and weight loss.  HENT: Negative for ear pain, hearing loss and tinnitus.   Eyes: Negative for blurred vision, double vision, pain and redness.  Respiratory: Positive for  shortness of breath. Negative for cough and hemoptysis.   Cardiovascular: Negative for chest pain, palpitations, orthopnea and leg swelling.  Gastrointestinal: Negative for abdominal pain, constipation, diarrhea, nausea and vomiting.  Genitourinary: Negative for dysuria, frequency and hematuria.  Musculoskeletal: Negative for back pain, joint pain and neck pain.  Skin:       No acne, rash, or lesions  Neurological: Negative for dizziness, tremors, focal weakness and weakness.  Endo/Heme/Allergies: Negative for polydipsia. Does not bruise/bleed easily.  Psychiatric/Behavioral: Negative for depression. The patient is not nervous/anxious and does not have insomnia.      VITAL SIGNS:   Vitals:   04/19/19 1800 04/19/19 1813 04/19/19 1925 04/19/19 2000  BP: (!) 162/104 (!) 162/94 128/79 122/73  Pulse: (!) 122 (!) 125 (!) 109 (!) 105  Resp: (!) 23 (!) 22 19 (!) 25  Temp:      TempSrc:      SpO2: 95% (!) 88% 96% 96%  Weight:      Height:       Wt Readings from Last 3 Encounters:  04/19/19 93 kg  04/18/19 95.3 kg  04/11/19 95.3 kg    PHYSICAL EXAMINATION:  Physical Exam  Vitals reviewed. Constitutional: He is oriented to person, place, and time. He appears well-developed and well-nourished. No distress.  HENT:  Head: Normocephalic and atraumatic.  Mouth/Throat: Oropharynx is clear and moist.  Eyes: Pupils are equal, round, and reactive to light. Conjunctivae and EOM are normal. No scleral icterus.  Neck: Normal range of motion. Neck supple. No JVD present. No thyromegaly present.  Cardiovascular: Regular rhythm and intact distal pulses. Exam reveals no gallop and no friction rub.  No murmur heard. Tachycardic  Respiratory: Effort normal. No respiratory distress. He has no wheezes. He has no rales.  Coarse breath sounds  GI: Soft. Bowel sounds are normal. He exhibits no distension. There is no abdominal tenderness.  Musculoskeletal: Normal range of motion.        General: No  edema.     Comments: No arthritis, no gout  Lymphadenopathy:    He has no cervical adenopathy.  Neurological: He is alert and oriented to person, place, and time. No cranial nerve deficit.  No dysarthria, no aphasia  Skin: Skin is warm and dry. No rash noted. No erythema.  Psychiatric: He has a normal mood and affect. His behavior is normal. Judgment and thought content normal.    LABORATORY PANEL:   CBC Recent Labs  Lab 04/19/19 1746  WBC 10.2  HGB 14.9  HCT 46.2  PLT 331   ------------------------------------------------------------------------------------------------------------------  Chemistries  Recent Labs  Lab 04/19/19 1746  NA 141  K 3.8  CL 105  CO2 24  GLUCOSE 113*  BUN 17  CREATININE 1.02  CALCIUM  8.7*  AST 37  ALT 59*  ALKPHOS 78  BILITOT 0.7   ------------------------------------------------------------------------------------------------------------------  Cardiac Enzymes No results for input(s): TROPONINI in the last 168 hours. ------------------------------------------------------------------------------------------------------------------  RADIOLOGY:  Dg Chest Portable 1 View  Result Date: 04/19/2019 CLINICAL DATA:  Continued cough and fever EXAM: PORTABLE CHEST 1 VIEW COMPARISON:  Portable exam 1732 hours compared to 04/18/2019 FINDINGS: Enlargement of cardiac silhouette. Mediastinal contours and pulmonary vascularity normal. Subsegmental atelectasis mid to lower lungs bilaterally. Increasing subtle opacity in RIGHT upper lobe suspicious for developing infiltrate. No pleural effusion or pneumothorax. Bones unremarkable. IMPRESSION: Scattered subsegmental atelectasis with question developing RIGHT upper lobe pneumonia. Electronically Signed   By: Lavonia Dana M.D.   On: 04/19/2019 18:01   Dg Chest Portable 1 View  Result Date: 04/18/2019 CLINICAL DATA:  Shortness of breath, cough.  COVID-19 positive. EXAM: PORTABLE CHEST 1 VIEW COMPARISON:  None.  FINDINGS: Low lung volumes. Bibasilar atelectasis. Heart is mildly enlarged. No visible effusions or acute bony abnormality. IMPRESSION: Low lung volumes with bibasilar atelectasis. Mild cardiomegaly. Electronically Signed   By: Rolm Baptise M.D.   On: 04/18/2019 10:52    EKG:   Orders placed or performed in visit on 04/19/19  . EKG 12-Lead    IMPRESSION AND PLAN:  Principal Problem:   Pneumonia due to COVID-19 virus -supportive treatment, O2 status is good on supplemental oxygen via nasal cannula.  Given likely superimposed bacterial pneumonia, with a moderately elevated procalcitonin, IV antibiotics were also initiated Active Problems:   Hyperlipidemia -home dose antilipid   GERD (gastroesophageal reflux disease) -home dose PPI  Patient being transferred to [facility] facility in [place]  Chart review performed and case discussed with ED provider. Labs, imaging and/or ECG reviewed by provider and discussed with patient/family. Management plans discussed with the patient and/or family.  COVID-19 status: Tested positive     DVT PROPHYLAXIS: SubQ lovenox   GI PROPHYLAXIS:  PPI   CODE STATUS: Full  TOTAL TIME TAKING CARE OF THIS PATIENT: 35 minutes.   This patient was evaluated in the context of the global COVID-19 pandemic, which necessitated consideration that the patient might be at risk for infection with the SARS-CoV-2 virus that causes COVID-19. Institutional protocols and algorithms that pertain to the evaluation of patients at risk for COVID-19 are in a state of rapid change based on information released by regulatory bodies including the CDC and federal and state organizations. These policies and algorithms were followed to the best of this provider's knowledge to date during the patient's care at this facility.  Ethlyn Daniels 04/19/2019, 9:11 PM  [facility]  Office  [phone]  CC: Primary care physician; Pleas [name], NP  Note:  This  document was prepared using Dragon voice recognition software and may include unintentional dictation errors.

## 2019-04-20 NOTE — Progress Notes (Signed)
Patient seen and examined this morning, admitted earlier today by Dr. Bridgett Larsson.  H&P reviewed, and agree with assessment and plan.  68 year old male with history of prostate cancer who just finished radiation therapy about 3 weeks ago and on hormonal therapy, GERD, obesity, admitted today 7/23 for hypoxic respiratory failure in the setting of COVID-19.  He presented to the ED on 7/20, was diagnosed with coronavirus however he was not hypoxic and sent home.  He progressively got worse and came back to the hospital and was admitted.  Chest x-ray showed developing right upper lobe pneumonia.   Acute hypoxic respiratory failure in the setting of COVID-19, viral pneumonia -Keep O2 sats above 88%, prone as able, patient started on steroids along with Remdesivir, continue -Monitor inflammatory markers  Preston Thomas M. Cruzita Lederer, MD, PhD [facility]  Contact via  [email or web address]  [place] P: [phone] F: [phone]

## 2019-04-20 NOTE — Progress Notes (Signed)
Pharmacy Brief Note   O:  ALT: 59 CXR: Increasing subtle opacity in RIGHT upper lobe suspicious for developing infiltrate. SpO2: 94% on nasal cannula. 88% on room air previously    A/P:  Patient meets requirements for remdesivir therapy.  Will start  remdesivir 200 mg IV x 1  followed by 100 mg IV daily x 4 days.  Monitor ALT  Royetta Asal, PharmD, BCPS 04/20/2019 1:40 AM

## 2019-04-20 NOTE — Progress Notes (Signed)
Pt arrived approx 0130 via [facility] from [place] came in to see pt , orders noted. Phone cal placed to pts wife- I updated her & he spoke with her.

## 2019-04-20 NOTE — Progress Notes (Signed)
Spoke with patient wife via phone, update given and questions answered.  Wife to bring cellphone, Games developer and boxers for patient.  1130: items mentioned above received from wife and placed in patient room.

## 2019-04-20 NOTE — ED Notes (Signed)
EMTALA and Medical Necessity documentation reviewed at this time and found to be compete per policy.

## 2019-04-20 NOTE — Assessment & Plan Note (Signed)
Chronic. 

## 2019-04-20 NOTE — Subjective & Objective (Addendum)
CC: SOB HPI: 68 yo WM with hx of prostate cancer s/p XRT completed 2-3 weeks ago, GERD, hyperlipidemia, presents as transfer from [facility] ED. Pt diagnosed with Covid-19 on April 11, 2019.  At that time, pt had been complaining to PCP of runny nose, productive cough and fever of 101.  At that time, he had denied any changes in taste or smell.  Over the next 7 days, symptoms did not resolve.  Pt contacted his PCP again and he was directed to [facility] ER for evaluation on 04-18-2019.  WBC noted to be 10.2 with lymphopenia. Normal BUN/SCr.  Pt had normal O2 sats on RA and he was discharged to home.  On 04-19-2019, pt's SOB became worse. pt had home pulse oximeter. Pt noted RA sats of 78% at home.  Pt noted fever of 103. Pt felt very weak and fatigued.  pt went back to [facility] ER for evaluation. This time, he was noted to be hypoxic with RA sats 88%. Pt started on 2 L/min O2. CXR showed developing RUL pneumonia.  Pt states he had niece that became ill after attending church youth camp.  Pt's father-in-law was in contact with pt's niece.  Father-in-law was also ill but only had a mild cough.  Pt's wife is asymptomatic at this time.    [place] contacted for transfer.  In ER, pt was given 10 mg IV Decadron and 1000 ml NS bolus. Pt states he feels better after receiving IV steroids.

## 2019-04-20 NOTE — Assessment & Plan Note (Signed)
Continue IV Decadron 1 mg/kg/day. His first dose was given on 04-19-2019 at [facility].  Will initiate remdesivir.  Check inflammatory markers. Hold on abx for now.

## 2019-04-20 NOTE — H&P (Signed)
History and Physical    Preston Thomas VXB:[id number] DOB: 10/04/1950 DOA: 04/20/2019  PCP: Pleas [name], NP   Patient coming from: [facility] ER  I have personally briefly reviewed patient's old medical records in [place]  CC: SOB HPI: 68 yo WM with hx of prostate cancer s/p XRT completed 2-3 weeks ago, GERD, hyperlipidemia, presents as transfer from [facility] ED. Pt diagnosed with Covid-19 on April 11, 2019.  At that time, pt had been complaining to PCP of runny nose, productive cough and fever of 101.  At that time, he had denied any changes in taste or smell.  Over the next 7 days, symptoms did not resolve.  Pt contacted his PCP again and he was directed to [facility] ER for evaluation on 04-18-2019.  WBC noted to be 10.2 with lymphopenia. Normal BUN/SCr.  Pt had normal O2 sats on RA and he was discharged to home.  On 04-19-2019, pt's SOB became worse. pt had home pulse oximeter. Pt noted RA sats of 78% at home.  Pt noted fever of 103. Pt felt very weak and fatigued.  pt went back to [facility] ER for evaluation. This time, he was noted to be hypoxic with RA sats 88%. Pt started on 2 L/min O2. CXR showed developing RUL pneumonia.  Pt states he had niece that became ill after attending church youth camp.  Pt's father-in-law was in contact with pt's niece.  Father-in-law was also ill but only had a mild cough.  Pt's wife is asymptomatic at this time.    [place] contacted for transfer.  In ER, pt was given 10 mg IV Decadron and 1000 ml NS bolus. Pt states he feels better after receiving IV steroids.    Review of Systems:  Review of Systems  Constitutional: Positive for chills, diaphoresis, fever and malaise/fatigue.  HENT:       Loss of taste and smell  Eyes: Negative.   Respiratory: Positive for cough, sputum production and shortness of breath.   Cardiovascular: Negative.   Gastrointestinal: Negative.   Genitourinary: Negative.   Musculoskeletal: Positive for myalgias.  Skin: Negative.    Neurological: Negative.   Endo/Heme/Allergies: Negative.   Psychiatric/Behavioral: Negative.   All other systems reviewed and are negative.   Past Medical History:  Diagnosis Date  . COLONIC POLYPS, HX OF 08/31/2007   Qualifier: Diagnosis of  By: Silvio Pate MD, [facility]   . DIVERTICULITIS, HX OF 08/31/2007   Qualifier: Diagnosis of  By: Joesph Fillers CMA (AAMA), Ronny Bacon    . DIVERTICULOSIS, COLON 08/31/2007   Qualifier: Diagnosis of  By: Silvio Pate MD, [facility]   . GERD (gastroesophageal reflux disease)   . RENAL CALCULUS, HX OF 08/31/2007   Qualifier: Diagnosis of  By: Joesph Fillers CMA (AAMA), Ronny Bacon      Past Surgical History:  Procedure Laterality Date  . APPENDECTOMY  1987  . CHOLECYSTECTOMY  1987     reports that he quit smoking about 34 years ago. His smoking use included cigarettes. He quit after 10.00 years of use. He has never used smokeless tobacco. He reports previous alcohol use. He reports that he does not use drugs.  Allergies  Allergen Reactions  . Penicillins     Passed out Did it involve swelling of the face/tongue/throat, SOB, or low BP? Yes Did it involve sudden or severe rash/hives, skin peeling, or any reaction on the inside of your mouth or nose? No Did you need to seek medical attention at a hospital or doctor's office?  No When did it last happen?30 + years If all above answers are "NO", may proceed with cephalosporin use.     Family History  Problem Relation Age of Onset  . Early death Father   . Kidney disease Father     Prior to Admission medications   Medication Sig Start Date End Date Taking? Authorizing Provider  acetaminophen (TYLENOL) 500 MG tablet Take 1,000 mg by mouth every 8 (eight) hours as needed for moderate pain.    [provider]  Cholecalciferol (DIALYVITE VITAMIN D 5000) 125 MCG (5000 UT) capsule Take 5,000 Units by mouth daily.    [provider]  guaiFENesin (MUCINEX) 600 MG 12 hr tablet Take 1 tablet (600 mg  total) by mouth 2 (two) times daily for 10 days. 04/18/19 04/28/19  Harvest Dark, MD  loratadine (CLARITIN) 10 MG tablet Take 10 mg by mouth daily as needed for allergies.    [provider]  omeprazole (PRILOSEC) 20 MG capsule Take 20 mg by mouth daily.    [provider]  SUMAtriptan (IMITREX) 100 MG tablet Take 1 tablet by mouth at migraine onset. May repeat in 2 hours if headache persists or recurs. Do not exceed 100 mg in 24 hours. 09/13/18   Pleas [name], NP  vitamin C (ASCORBIC ACID) 250 MG tablet Take 250 mg by mouth daily.    [provider]    Physical Exam: Vitals:   04/20/19 0127  BP: 113/80  Pulse: 88  Resp: 19  Temp: 98.9 F (37.2 C)  TempSrc: Oral  SpO2: 94%    Physical Exam  Nursing note and vitals reviewed. Constitutional: He is oriented to person, place, and time. He appears well-nourished. No distress.  HENT:  Head: Normocephalic and atraumatic.  Nose: Nose normal.  Eyes: Right eye exhibits no discharge. Left eye exhibits no discharge. No scleral icterus.  Neck: No JVD present.  Cardiovascular: Normal rate and regular rhythm.  Respiratory: No respiratory distress. He has no wheezes. He has rales in the right upper field, the right middle field, the right lower field, the left upper field, the left middle field and the left lower field.  GI: Soft. Bowel sounds are normal. He exhibits no distension. There is no abdominal tenderness. There is no rebound.  Musculoskeletal:        General: No deformity or edema.  Neurological: He is alert and oriented to person, place, and time.  Skin: Skin is warm and dry. No rash noted. He is not diaphoretic.  Psychiatric: He has a normal mood and affect. His behavior is normal. Judgment and thought content normal.     Labs on Admission: I have personally reviewed following labs and imaging studies  CBC: Recent Labs  Lab 04/19/19 1746  WBC 10.2  NEUTROABS 8.6*  HGB 14.9  HCT 46.2  MCV  93.1  PLT 601   Basic Metabolic Panel: Recent Labs  Lab 04/19/19 1746  NA 141  K 3.8  CL 105  CO2 24  GLUCOSE 113*  BUN 17  CREATININE 1.02  CALCIUM 8.7*   GFR: Estimated Creatinine Clearance: 67.2 mL/min (by C-G formula based on SCr of 1.02 mg/dL). Liver Function Tests: Recent Labs  Lab 04/19/19 1746  AST 37  ALT 59*  ALKPHOS 78  BILITOT 0.7  PROT 8.1  ALBUMIN 3.6   No results for input(s): LIPASE, AMYLASE in the last 168 hours. No results for input(s): AMMONIA in the last 168 hours. Coagulation Profile: Recent Labs  Lab  04/19/19 1746  INR 1.0   Cardiac Enzymes: No results for input(s): CKTOTAL, CKMB, CKMBINDEX, TROPONINI in the last 168 hours. BNP (last 3 results) No results for input(s): PROBNP in the last 8760 hours. HbA1C: No results for input(s): HGBA1C in the last 72 hours. CBG: No results for input(s): GLUCAP in the last 168 hours. Lipid Profile: No results for input(s): CHOL, HDL, LDLCALC, TRIG, CHOLHDL, LDLDIRECT in the last 72 hours. Thyroid Function Tests: No results for input(s): TSH, T4TOTAL, FREET4, T3FREE, THYROIDAB in the last 72 hours. Anemia Panel: No results for input(s): VITAMINB12, FOLATE, FERRITIN, TIBC, IRON, RETICCTPCT in the last 72 hours. Urine analysis:    Component Value Date/Time   APPEARANCEUR Clear 11/11/2018 1005   GLUCOSEU Negative 11/11/2018 1005   BILIRUBINUR Negative 11/11/2018 1005   PROTEINUR Negative 11/11/2018 1005   NITRITE Negative 11/11/2018 1005   LEUKOCYTESUR Negative 11/11/2018 1005    COVID-19 Labs  No results for input(s): DDIMER, FERRITIN, LDH, CRP in the last 72 hours.  Lab Results  Component Value Date   SARSCOV2NAA Detected (A) 04/11/2019     Radiological Exams on Admission: I have personally reviewed images Dg Chest Portable 1 View  Result Date: 04/19/2019 CLINICAL DATA:  Continued cough and fever EXAM: PORTABLE CHEST 1 VIEW COMPARISON:  Portable exam 1732 hours compared to 04/18/2019  FINDINGS: Enlargement of cardiac silhouette. Mediastinal contours and pulmonary vascularity normal. Subsegmental atelectasis mid to lower lungs bilaterally. Increasing subtle opacity in RIGHT upper lobe suspicious for developing infiltrate. No pleural effusion or pneumothorax. Bones unremarkable. IMPRESSION: Scattered subsegmental atelectasis with question developing RIGHT upper lobe pneumonia. Electronically Signed   By: Lavonia Dana M.D.   On: 04/19/2019 18:01   Dg Chest Portable 1 View  Result Date: 04/18/2019 CLINICAL DATA:  Shortness of breath, cough.  COVID-19 positive. EXAM: PORTABLE CHEST 1 VIEW COMPARISON:  None. FINDINGS: Low lung volumes. Bibasilar atelectasis. Heart is mildly enlarged. No visible effusions or acute bony abnormality. IMPRESSION: Low lung volumes with bibasilar atelectasis. Mild cardiomegaly. Electronically Signed   By: Rolm Baptise M.D.   On: 04/18/2019 10:52    EKG: I have personally reviewed EKG:  Sinus tachycardia  Assessment/Plan Principal Problem:   Acute respiratory failure with hypoxia (HCC) Active Problems:   Pneumonia due to COVID-19 virus   Hyperlipidemia   GERD (gastroesophageal reflux disease)   Prostate cancer (HCC)   Hx of radiation therapy    Acute respiratory failure with hypoxia ([place]) Admit to progressive care bed. Continue with supplemental O2 to keep O2 sats >88%. Respiratory therapy evaluation.  Pneumonia due to COVID-19 virus Continue IV Decadron 1 mg/kg/day. His first dose was given on 04-19-2019 at [facility].  Will initiate remdesivir.  Check inflammatory markers. Hold on abx for now.  Hyperlipidemia Chronic.  GERD (gastroesophageal reflux disease) Continue PPI.  Prostate cancer ([place]) Stable.  Hx of radiation therapy Stable.  DVT prophylaxis: Lovenox Code Status: Full Code Family Communication: RN has called pt's wife  Disposition Plan: plan for DC to home once hypoxia resolves and he completes Remdesivir.  Consults called: none   Admission status: Inpatient, Step Down Unit  Kristopher Oppenheim, DO [facility] 04/20/2019, 1:59 AM

## 2019-04-20 NOTE — Assessment & Plan Note (Signed)
Admit to progressive care bed. Continue with supplemental O2 to keep O2 sats >88%. Respiratory therapy evaluation.

## 2019-04-20 NOTE — Assessment & Plan Note (Signed)
Continue PPI ?

## 2019-04-20 NOTE — Progress Notes (Signed)
Pt has been sitting in the recliner now for several hours, ( I extended the foot rest) and pt wishes to stay here until ready to sleep. Pt is on Facetime with his wife. She verbalized no ?s or concerns.

## 2019-04-21 DIAGNOSIS — Z923 Personal history of irradiation: Secondary | ICD-10-CM

## 2019-04-21 LAB — CBC WITH DIFFERENTIAL/PLATELET
Abs Immature Granulocytes: 0.51 10*3/uL — ABNORMAL HIGH (ref 0.00–0.07)
Basophils Absolute: 0 10*3/uL (ref 0.0–0.1)
Basophils Relative: 0 %
Eosinophils Absolute: 0 10*3/uL (ref 0.0–0.5)
Eosinophils Relative: 0 %
HCT: 42.8 % (ref 39.0–52.0)
Hemoglobin: 13.7 g/dL (ref 13.0–17.0)
Immature Granulocytes: 5 %
Lymphocytes Relative: 4 %
Lymphs Abs: 0.4 10*3/uL — ABNORMAL LOW (ref 0.7–4.0)
MCH: 30.4 pg (ref 26.0–34.0)
MCHC: 32 g/dL (ref 30.0–36.0)
MCV: 94.9 fL (ref 80.0–100.0)
Monocytes Absolute: 0.6 10*3/uL (ref 0.1–1.0)
Monocytes Relative: 6 %
Neutro Abs: 8.4 10*3/uL — ABNORMAL HIGH (ref 1.7–7.7)
Neutrophils Relative %: 85 %
Platelets: 381 10*3/uL (ref 150–400)
RBC: 4.51 MIL/uL (ref 4.22–5.81)
RDW: 14.5 % (ref 11.5–15.5)
WBC: 9.9 10*3/uL (ref 4.0–10.5)
nRBC: 0.2 % (ref 0.0–0.2)

## 2019-04-21 LAB — D-DIMER, QUANTITATIVE: D-Dimer, Quant: 0.49 ug/mL-FEU (ref 0.00–0.50)

## 2019-04-21 LAB — COMPREHENSIVE METABOLIC PANEL
ALT: 48 U/L — ABNORMAL HIGH (ref 0–44)
AST: 31 U/L (ref 15–41)
Albumin: 3 g/dL — ABNORMAL LOW (ref 3.5–5.0)
Alkaline Phosphatase: 60 U/L (ref 38–126)
Anion gap: 12 (ref 5–15)
BUN: 26 mg/dL — ABNORMAL HIGH (ref 8–23)
CO2: 25 mmol/L (ref 22–32)
Calcium: 8.5 mg/dL — ABNORMAL LOW (ref 8.9–10.3)
Chloride: 105 mmol/L (ref 98–111)
Creatinine, Ser: 0.88 mg/dL (ref 0.61–1.24)
GFR calc Af Amer: >60 mL/min (ref >60)
GFR calc non Af Amer: >60 mL/min (ref >60)
Glucose, Bld: 143 mg/dL — ABNORMAL HIGH (ref 70–99)
Potassium: 3.9 mmol/L (ref 3.5–5.1)
Sodium: 142 mmol/L (ref 135–145)
Total Bilirubin: 0.5 mg/dL (ref 0.3–1.2)
Total Protein: 7 g/dL (ref 6.5–8.1)

## 2019-04-21 LAB — FERRITIN: Ferritin: 1005 ng/mL — ABNORMAL HIGH (ref 24–336)

## 2019-04-21 LAB — C-REACTIVE PROTEIN: CRP: 14.4 mg/dL — ABNORMAL HIGH (ref <1.0)

## 2019-04-21 MED ORDER — PANTOPRAZOLE SODIUM 40 MG PO TBEC
40.0000 mg | DELAYED_RELEASE_TABLET | Freq: Every day | ORAL | Status: DC
  Administered 2019-04-21 – 2019-04-26 (×7): 40 mg via ORAL
  Filled 2019-04-21 (×6): qty 1

## 2019-04-21 MED ORDER — VITAMIN C 500 MG PO TABS
250.0000 mg | ORAL_TABLET | Freq: Every day | ORAL | Status: DC
  Administered 2019-04-21 – 2019-04-26 (×6): 250 mg via ORAL
  Filled 2019-04-21 (×6): qty 1

## 2019-04-21 MED ORDER — VITAMIN D 25 MCG (1000 UNIT) PO TABS
5000.0000 [IU] | ORAL_TABLET | Freq: Every day | ORAL | Status: DC
  Administered 2019-04-21 – 2019-04-26 (×6): 5000 [IU] via ORAL
  Filled 2019-04-21 (×6): qty 5

## 2019-04-21 NOTE — Progress Notes (Signed)
PROGRESS NOTE  Preston Thomas QZR:[id number] DOB: Jan 08, 1951 DOA: 04/20/2019 PCP: Pleas [name], NP   LOS: 1 day   Brief Narrative / Interim history: 68 year old male with history of prostate cancer who just finished radiation therapy about 3 weeks ago and on hormonal therapy, GERD, obesity, admitted today 7/23 for hypoxic respiratory failure in the setting of COVID-19.  He presented to the ED on 7/20, was diagnosed with coronavirus however he was not hypoxic and sent home.  He progressively got worse and came back to the hospital and was admitted.  Chest x-ray showed developing right upper lobe pneumonia.  Subjective: Feels better, appreciates his respiratory status is improved.  Denies any chest pain, no abdominal pain, no nausea or vomiting.  Assessment & Plan: Principal Problem:   Acute respiratory failure with hypoxia (HCC) Active Problems:   Hyperlipidemia   GERD (gastroesophageal reflux disease)   Prostate cancer (HCC)   Hx of radiation therapy   Pneumonia due to COVID-19 virus   Principal Problem Acute Hypoxic Respiratory Failure due to Covid-19 Viral Illness / viral pneumonia -Continue supplemental oxygen to maintain O2 sats above 88%, currently on 2 L nasal cannula and stable since yesterday -Continue to encourage protein as able -Monitor inflammatory markers, still elevated but improving compared to yesterday -Patient was started on Remdesivir and steroids on 7/23, continue   COVID-19 Labs  Recent Labs    04/20/19 0725 04/21/19 0145  DDIMER 0.46 0.49  FERRITIN 1,069* 1,005*  CRP 25.1* 14.4*    Lab Results  Component Value Date   SARSCOV2NAA Detected (A) 04/11/2019    Active Problems Prostate cancer -Status post radiation therapy 3 weeks ago, on hormonal therapy as well.  Outpatient follow-up  GERD -Continue PPI  Obesity -Would benefit from weight loss  History of migraine headaches -Stable  Scheduled Meds: . dexamethasone  6 mg  Oral Daily  . enoxaparin (LOVENOX) injection  0.5 mg/kg Subcutaneous Q24H  . sodium chloride flush  3 mL Intravenous Q12H   Continuous Infusions: . sodium chloride 250 mL (04/20/19 0404)  . remdesivir 100 mg in NS 250 mL 100 mg (04/21/19 0823)   PRN Meds:.sodium chloride, acetaminophen, guaiFENesin-dextromethorphan, Ipratropium-Albuterol, ondansetron **OR** ondansetron (ZOFRAN) IV, senna-docusate, sodium chloride flush, traMADol  DVT prophylaxis: Lovenox Code Status: Full code Family Communication: d/w patient  Disposition Plan: home when ready   Consultants:   None   Procedures:   None   Antimicrobials:  None    Objective: Vitals:   04/21/19 0024 04/21/19 0433 04/21/19 0742 04/21/19 0852  BP:  121/83 121/76   Pulse:   93   Resp:  20 16   Temp: 98.1 F (36.7 C) 98.1 F (36.7 C) 98.2 F (36.8 C)   TempSrc: Oral Oral Oral   SpO2:   94% (!) 85%  Weight:      Height:        Intake/Output Summary (Last 24 hours) at 04/21/2019 1051 Last data filed at 04/21/2019 1022 Gross per 24 hour  Intake 627.11 ml  Output 525 ml  Net 102.11 ml   Filed Weights   04/20/19 1315  Weight: 93 kg    Examination:  Constitutional: NAD Eyes: PERRL, lids and conjunctivae normal ENMT: Mucous membranes are moist.  Respiratory: Diminished at the bases, faint rhonchi, no crackles, no wheezing Cardiovascular: Regular rate and rhythm, no murmurs / rubs / gallops. Trace edema Abdomen: no tenderness. Bowel sounds positive.  Musculoskeletal: no clubbing / cyanosis.  Skin: no rashes Neurologic: CN 2-12  grossly intact. Strength 5/5 in all 4.  Psychiatric: Normal judgment and insight. Alert and oriented x 3. Normal mood.    Data Reviewed: I have independently reviewed following labs and imaging studies   CBC: Recent Labs  Lab 04/19/19 1746 04/20/19 0725 04/21/19 0145  WBC 10.2 8.7 9.9  NEUTROABS 8.6* 7.6 8.4*  HGB 14.9 13.9 13.7  HCT 46.2 44.3 42.8  MCV 93.1 94.7 94.9  PLT 331  328 007   Basic Metabolic Panel: Recent Labs  Lab 04/19/19 1746 04/20/19 0725 04/21/19 0145  NA 141 144 142  K 3.8 4.1 3.9  CL 105 107 105  CO2 24 28 25   GLUCOSE 113* 159* 143*  BUN 17 18 26*  CREATININE 1.02 0.84 0.88  CALCIUM 8.7* 8.2* 8.5*   GFR: Estimated Creatinine Clearance: 77.9 mL/min (by C-G formula based on SCr of 0.88 mg/dL). Liver Function Tests: Recent Labs  Lab 04/19/19 1746 04/20/19 0725 04/21/19 0145  AST 37 27 31  ALT 59* 45* 48*  ALKPHOS 78 66 60  BILITOT 0.7 0.5 0.5  PROT 8.1 7.2 7.0  ALBUMIN 3.6 2.9* 3.0*   No results for input(s): LIPASE, AMYLASE in the last 168 hours. No results for input(s): AMMONIA in the last 168 hours. Coagulation Profile: Recent Labs  Lab 04/19/19 1746  INR 1.0   Cardiac Enzymes: No results for input(s): CKTOTAL, CKMB, CKMBINDEX, TROPONINI in the last 168 hours. BNP (last 3 results) No results for input(s): PROBNP in the last 8760 hours. HbA1C: No results for input(s): HGBA1C in the last 72 hours. CBG: No results for input(s): GLUCAP in the last 168 hours. Lipid Profile: No results for input(s): CHOL, HDL, LDLCALC, TRIG, CHOLHDL, LDLDIRECT in the last 72 hours. Thyroid Function Tests: No results for input(s): TSH, T4TOTAL, FREET4, T3FREE, THYROIDAB in the last 72 hours. Anemia Panel: Recent Labs    04/20/19 0725 04/21/19 0145  FERRITIN 1,069* 1,005*   Urine analysis:    Component Value Date/Time   APPEARANCEUR Clear 11/11/2018 1005   GLUCOSEU Negative 11/11/2018 1005   BILIRUBINUR Negative 11/11/2018 1005   PROTEINUR Negative 11/11/2018 1005   NITRITE Negative 11/11/2018 1005   LEUKOCYTESUR Negative 11/11/2018 1005   Sepsis Labs: Invalid input(s): PROCALCITONIN, LACTICIDVEN  Recent Results (from the past 240 hour(s))  Novel Coronavirus, NAA ([facility])     Status: Abnormal   Collection Time: 04/11/19 12:40 PM  Result Value Ref Range Status   SARS-CoV-2, NAA Detected (A) Not Detected Final     Comment: Testing was performed using the cobas(R) SARS-CoV-2 test. This test was developed and its performance characteristics determined by [employer]. This test has not been FDA cleared or approved. This test has been authorized by FDA under an Emergency Use Authorization (EUA). This test is only authorized for the duration of time the declaration that circumstances exist justifying the authorization of the emergency use of in vitro diagnostic tests for detection of SARS-CoV-2 virus and/or diagnosis of COVID-19 infection under section 564(b)(1) of the Act, 21 U.S.C. 622QJF-3(L)(4), unless the authorization is terminated or revoked sooner. When diagnostic testing is negative, the possibility of a false negative result should be considered in the context of a patient's recent exposures and the presence of clinical signs and symptoms consistent with COVID-19. An individual without symptoms of COVID-19 and who is not shedding SARS-CoV-2 virus would expect to have a negati ve (not detected) result in this assay.   Culture, blood (Routine x 2)     Status: None (Preliminary result)  Collection Time: 04/19/19  5:46 PM   Specimen: BLOOD  Result Value Ref Range Status   Specimen Description BLOOD RIGHT ANTECUBITAL  Final   Special Requests   Final    BOTTLES DRAWN AEROBIC AND ANAEROBIC Blood Culture adequate volume   Culture   Final    NO GROWTH 2 DAYS Performed at [facility], [address] [place], [place] [address]    Report Status PENDING  Incomplete  Culture, blood (Routine x 2)     Status: None (Preliminary result)   Collection Time: 04/19/19  6:05 PM   Specimen: BLOOD  Result Value Ref Range Status   Specimen Description BLOOD LEFT ANTECUBITAL  Final   Special Requests   Final    BOTTLES DRAWN AEROBIC AND ANAEROBIC Blood Culture adequate volume   Culture   Final    NO GROWTH 2 DAYS Performed at [facility], [address], [place], [place]  [address]    Report Status PENDING  Incomplete      Radiology Studies: Dg Chest Portable 1 View  Result Date: 04/19/2019 CLINICAL DATA:  Continued cough and fever EXAM: PORTABLE CHEST 1 VIEW COMPARISON:  Portable exam 1732 hours compared to 04/18/2019 FINDINGS: Enlargement of cardiac silhouette. Mediastinal contours and pulmonary vascularity normal. Subsegmental atelectasis mid to lower lungs bilaterally. Increasing subtle opacity in RIGHT upper lobe suspicious for developing infiltrate. No pleural effusion or pneumothorax. Bones unremarkable. IMPRESSION: Scattered subsegmental atelectasis with question developing RIGHT upper lobe pneumonia. Electronically Signed   By: Lavonia Dana M.D.   On: 04/19/2019 18:01    Marzetta Board, MD, PhD [facility]  Contact via  [email or web address]  [place] P: [phone] F: [phone]

## 2019-04-22 LAB — COMPREHENSIVE METABOLIC PANEL
ALT: 241 U/L — ABNORMAL HIGH (ref 0–44)
AST: 156 U/L — ABNORMAL HIGH (ref 15–41)
Albumin: 3 g/dL — ABNORMAL LOW (ref 3.5–5.0)
Alkaline Phosphatase: 74 U/L (ref 38–126)
Anion gap: 11 (ref 5–15)
BUN: 28 mg/dL — ABNORMAL HIGH (ref 8–23)
CO2: 27 mmol/L (ref 22–32)
Calcium: 8.7 mg/dL — ABNORMAL LOW (ref 8.9–10.3)
Chloride: 106 mmol/L (ref 98–111)
Creatinine, Ser: 0.94 mg/dL (ref 0.61–1.24)
GFR calc Af Amer: >60 mL/min (ref >60)
GFR calc non Af Amer: >60 mL/min (ref >60)
Glucose, Bld: 136 mg/dL — ABNORMAL HIGH (ref 70–99)
Potassium: 4.1 mmol/L (ref 3.5–5.1)
Sodium: 144 mmol/L (ref 135–145)
Total Bilirubin: 0.4 mg/dL (ref 0.3–1.2)
Total Protein: 6.9 g/dL (ref 6.5–8.1)

## 2019-04-22 LAB — CBC
HCT: 42.6 % (ref 39.0–52.0)
Hemoglobin: 13.4 g/dL (ref 13.0–17.0)
MCH: 29.6 pg (ref 26.0–34.0)
MCHC: 31.5 g/dL (ref 30.0–36.0)
MCV: 94.2 fL (ref 80.0–100.0)
Platelets: 441 10*3/uL — ABNORMAL HIGH (ref 150–400)
RBC: 4.52 MIL/uL (ref 4.22–5.81)
RDW: 14.2 % (ref 11.5–15.5)
WBC: 8.7 10*3/uL (ref 4.0–10.5)
nRBC: 0 % (ref 0.0–0.2)

## 2019-04-22 LAB — C-REACTIVE PROTEIN: CRP: 6.8 mg/dL — ABNORMAL HIGH (ref <1.0)

## 2019-04-22 LAB — D-DIMER, QUANTITATIVE: D-Dimer, Quant: 0.42 ug/mL-FEU (ref 0.00–0.50)

## 2019-04-22 LAB — LACTATE DEHYDROGENASE: LDH: 379 U/L — ABNORMAL HIGH (ref 98–192)

## 2019-04-22 LAB — FERRITIN: Ferritin: 1684 ng/mL — ABNORMAL HIGH (ref 24–336)

## 2019-04-22 MED ORDER — SENNOSIDES-DOCUSATE SODIUM 8.6-50 MG PO TABS
2.0000 | ORAL_TABLET | Freq: Two times a day (BID) | ORAL | Status: DC
  Administered 2019-04-22 – 2019-04-26 (×4): 2 via ORAL
  Filled 2019-04-22 (×7): qty 2

## 2019-04-22 MED ORDER — FUROSEMIDE 10 MG/ML IJ SOLN
20.0000 mg | Freq: Once | INTRAMUSCULAR | Status: AC
  Administered 2019-04-22: 20 mg via INTRAVENOUS
  Filled 2019-04-22: qty 2

## 2019-04-22 MED ORDER — AMLODIPINE BESYLATE 5 MG PO TABS: 5.0000 mg | ORAL_TABLET | Freq: Every day | ORAL | Status: DC

## 2019-04-22 NOTE — Progress Notes (Signed)
PROGRESS NOTE  Doctor Sheahan III HKV:[id number] DOB: [birth year] DOA: 04/20/2019 PCP: Pleas [name], NP   LOS: 2 days   Brief Narrative / Interim history: 68 year old male with history of prostate cancer who just finished radiation therapy about 3 weeks ago and on hormonal therapy, GERD, obesity, admitted today 7/23 for hypoxic respiratory failure in the setting of COVID-19.  He presented to the ED on 7/20, was diagnosed with coronavirus however he was not hypoxic and sent home.  He progressively got worse and came back to the hospital and was admitted.  Chest x-ray showed developing right upper lobe pneumonia.  Subjective: Feels better, breathing is better.  He denies any significant shortness of breath at rest but when he ambulates he gets winded and needs a minute or 2 to recover.  He denies any chest pain, no abdominal pain, no nausea or vomiting.  Assessment & Plan: Principal Problem:   Acute respiratory failure with hypoxia (HCC) Active Problems:   Hyperlipidemia   GERD (gastroesophageal reflux disease)   Prostate cancer (HCC)   Hx of radiation therapy   Pneumonia due to COVID-19 virus   Principal Problem Acute Hypoxic Respiratory Failure due to Covid-19 Viral Illness / viral pneumonia -Continue supplemental oxygen to maintain O2 sats above 88%, currently on 1 L.  Continue to encourage prone as able -Monitor inflammatory markers, CRP improving however ferritin increasing today -He was started on Remdesivir on 7/23, received 2 doses however given LFT elevation today this will be placed on hold.  I have discussed with pharmacy -Continue steroids   COVID-19 Labs  Recent Labs    04/20/19 0725 04/21/19 0145 04/22/19 0145  DDIMER 0.46 0.49 0.42  FERRITIN 1,069* 1,005* 1,684*  LDH  --   --  379*  CRP 25.1* 14.4* 6.8*    Lab Results  Component Value Date   SARSCOV2NAA Detected (A) 04/11/2019    Active Problems Mild bilateral lower extremity swelling  -Patient reports increasing swelling in his legs following radiation treatment for his prostate cancer.  We will give Lasix today  Elevated blood pressure -Patient without history of hypertension, on no medications at home.  Blood pressure slowly creeping up in the last couple of days, given lower extremity swelling will give Lasix x1 and monitor BP.    Elevated liver enzymes -Likely in the setting of viral illness/Remdesivir treatment.  Hold Remdesivir today after discussing with pharmacy  Prostate cancer -Status post radiation therapy 3 weeks ago, on hormonal therapy as well.  Outpatient follow-up  GERD -Continue PPI  Obesity -Would benefit from weight loss  History of migraine headaches -Stable  Scheduled Meds: . cholecalciferol  5,000 Units Oral Daily  . dexamethasone  6 mg Oral Daily  . enoxaparin (LOVENOX) injection  0.5 mg/kg Subcutaneous Q24H  . pantoprazole  40 mg Oral Daily  . senna-docusate  2 tablet Oral BID  . sodium chloride flush  3 mL Intravenous Q12H  . vitamin C  250 mg Oral Daily   Continuous Infusions: . sodium chloride 250 mL (04/20/19 0404)   PRN Meds:.sodium chloride, acetaminophen, guaiFENesin-dextromethorphan, Ipratropium-Albuterol, ondansetron **OR** ondansetron (ZOFRAN) IV, sodium chloride flush, traMADol  DVT prophylaxis: Lovenox Code Status: Full code Family Communication: d/w patient  Disposition Plan: home when ready   Consultants:   None   Procedures:   None   Antimicrobials:  None    Objective: Vitals:   04/21/19 2122 04/21/19 2123 04/22/19 0809 04/22/19 0830  BP: (!) 156/91  (!) 164/100   Pulse: 79  81 91   Resp:    17  Temp:    98 F (36.7 C)  TempSrc:    Oral  SpO2: 93% 95% 90%   Weight:      Height:        Intake/Output Summary (Last 24 hours) at 04/22/2019 1122 Last data filed at 04/22/2019 0839 Gross per 24 hour  Intake -  Output 325 ml  Net -325 ml   Filed Weights   04/20/19 1315  Weight: 93 kg     Examination:  Constitutional: No distress, sitting edge of the bed, appears comfortable Eyes: No scleral icterus ENMT: Moist mucous membranes Respiratory: Diminished at the bases, faint rhonchi, no crackles or wheezing heard.  Normal respiratory effort Cardiovascular: Regular rate and rhythm, no murmurs appreciated.  Trace edema Abdomen: Soft, nontender, nondistended, bowel sounds positive Musculoskeletal: no clubbing / cyanosis.  Skin: No new rashes Neurologic: Nonfocal, equal strength, ambulatory Psychiatric: Normal judgment and insight. Alert and oriented x 3. Normal mood.    Data Reviewed: I have independently reviewed following labs and imaging studies   CBC: Recent Labs  Lab 04/19/19 1746 04/20/19 0725 04/21/19 0145 04/22/19 0145  WBC 10.2 8.7 9.9 8.7  NEUTROABS 8.6* 7.6 8.4*  --   HGB 14.9 13.9 13.7 13.4  HCT 46.2 44.3 42.8 42.6  MCV 93.1 94.7 94.9 94.2  PLT 331 328 381 235*   Basic Metabolic Panel: Recent Labs  Lab 04/19/19 1746 04/20/19 0725 04/21/19 0145 04/22/19 0145  NA 141 144 142 144  K 3.8 4.1 3.9 4.1  CL 105 107 105 106  CO2 24 28 25 27   GLUCOSE 113* 159* 143* 136*  BUN 17 18 26* 28*  CREATININE 1.02 0.84 0.88 0.94  CALCIUM 8.7* 8.2* 8.5* 8.7*   GFR: Estimated Creatinine Clearance: 72.9 mL/min (by C-G formula based on SCr of 0.94 mg/dL). Liver Function Tests: Recent Labs  Lab 04/19/19 1746 04/20/19 0725 04/21/19 0145 04/22/19 0145  AST 37 27 31 156*  ALT 59* 45* 48* 241*  ALKPHOS 78 66 60 74  BILITOT 0.7 0.5 0.5 0.4  PROT 8.1 7.2 7.0 6.9  ALBUMIN 3.6 2.9* 3.0* 3.0*   No results for input(s): LIPASE, AMYLASE in the last 168 hours. No results for input(s): AMMONIA in the last 168 hours. Coagulation Profile: Recent Labs  Lab 04/19/19 1746  INR 1.0   Cardiac Enzymes: No results for input(s): CKTOTAL, CKMB, CKMBINDEX, TROPONINI in the last 168 hours. BNP (last 3 results) No results for input(s): PROBNP in the last 8760 hours.  HbA1C: No results for input(s): HGBA1C in the last 72 hours. CBG: No results for input(s): GLUCAP in the last 168 hours. Lipid Profile: No results for input(s): CHOL, HDL, LDLCALC, TRIG, CHOLHDL, LDLDIRECT in the last 72 hours. Thyroid Function Tests: No results for input(s): TSH, T4TOTAL, FREET4, T3FREE, THYROIDAB in the last 72 hours. Anemia Panel: Recent Labs    04/21/19 0145 04/22/19 0145  FERRITIN 1,005* 1,684*   Urine analysis:    Component Value Date/Time   APPEARANCEUR Clear 11/11/2018 1005   GLUCOSEU Negative 11/11/2018 1005   BILIRUBINUR Negative 11/11/2018 1005   PROTEINUR Negative 11/11/2018 1005   NITRITE Negative 11/11/2018 1005   LEUKOCYTESUR Negative 11/11/2018 1005   Sepsis Labs: Invalid input(s): PROCALCITONIN, LACTICIDVEN  Recent Results (from the past 240 hour(s))  Culture, blood (Routine x 2)     Status: None (Preliminary result)   Collection Time: 04/19/19  5:46 PM   Specimen: BLOOD  Result Value Ref  Range Status   Specimen Description BLOOD RIGHT ANTECUBITAL  Final   Special Requests   Final    BOTTLES DRAWN AEROBIC AND ANAEROBIC Blood Culture adequate volume   Culture   Final    NO GROWTH 3 DAYS Performed at [facility], [address], [place], [place] [address]    Report Status PENDING  Incomplete  Culture, blood (Routine x 2)     Status: None (Preliminary result)   Collection Time: 04/19/19  6:05 PM   Specimen: BLOOD  Result Value Ref Range Status   Specimen Description BLOOD LEFT ANTECUBITAL  Final   Special Requests   Final    BOTTLES DRAWN AEROBIC AND ANAEROBIC Blood Culture adequate volume   Culture   Final    NO GROWTH 3 DAYS Performed at [facility], [address], [place], [place] [address]    Report Status PENDING  Incomplete     Radiology Studies: No results found.  Marzetta Board, MD, PhD [facility]  Contact via  [email or web address]  [place] P: [phone] F: [phone]

## 2019-04-23 LAB — CBC
HCT: 43.2 % (ref 39.0–52.0)
Hemoglobin: 13.6 g/dL (ref 13.0–17.0)
MCH: 29.2 pg (ref 26.0–34.0)
MCHC: 31.5 g/dL (ref 30.0–36.0)
MCV: 92.9 fL (ref 80.0–100.0)
Platelets: 488 10*3/uL — ABNORMAL HIGH (ref 150–400)
RBC: 4.65 MIL/uL (ref 4.22–5.81)
RDW: 14 % (ref 11.5–15.5)
WBC: 10.1 10*3/uL (ref 4.0–10.5)
nRBC: 0.2 % (ref 0.0–0.2)

## 2019-04-23 LAB — C-REACTIVE PROTEIN: CRP: 3 mg/dL — ABNORMAL HIGH (ref <1.0)

## 2019-04-23 LAB — COMPREHENSIVE METABOLIC PANEL
ALT: 345 U/L — ABNORMAL HIGH (ref 0–44)
AST: 121 U/L — ABNORMAL HIGH (ref 15–41)
Albumin: 2.9 g/dL — ABNORMAL LOW (ref 3.5–5.0)
Alkaline Phosphatase: 73 U/L (ref 38–126)
Anion gap: 12 (ref 5–15)
BUN: 30 mg/dL — ABNORMAL HIGH (ref 8–23)
CO2: 26 mmol/L (ref 22–32)
Calcium: 8.5 mg/dL — ABNORMAL LOW (ref 8.9–10.3)
Chloride: 105 mmol/L (ref 98–111)
Creatinine, Ser: 0.91 mg/dL (ref 0.61–1.24)
GFR calc Af Amer: >60 mL/min (ref >60)
GFR calc non Af Amer: >60 mL/min (ref >60)
Glucose, Bld: 135 mg/dL — ABNORMAL HIGH (ref 70–99)
Potassium: 3.7 mmol/L (ref 3.5–5.1)
Sodium: 143 mmol/L (ref 135–145)
Total Bilirubin: 0.3 mg/dL (ref 0.3–1.2)
Total Protein: 6.6 g/dL (ref 6.5–8.1)

## 2019-04-23 LAB — D-DIMER, QUANTITATIVE: D-Dimer, Quant: 0.44 ug/mL-FEU (ref 0.00–0.50)

## 2019-04-23 LAB — LACTATE DEHYDROGENASE: LDH: 318 U/L — ABNORMAL HIGH (ref 98–192)

## 2019-04-23 LAB — FERRITIN: Ferritin: 1370 ng/mL — ABNORMAL HIGH (ref 24–336)

## 2019-04-23 LAB — MAGNESIUM: Magnesium: 2 mg/dL (ref 1.7–2.4)

## 2019-04-23 NOTE — Progress Notes (Signed)
PROGRESS NOTE  Preston Thomas  ZCH:[id number] DOB: 12-18-1950 DOA: 04/20/2019 PCP: Pleas [name], NP  Brief Narrative: Preston Thomas is a 68 y.o. male with a history of prostate CA s/p recent XRT and hormone therapy, obesity and GERD who was admitted 7/23 for hypoxic respiratory failure due to covid-19 infection which had been diagnosed on 7/20. He reports symptoms beginning on 7/13. Remdesivir given though LFTs increased, so it was stopped. Steroids continue and oxygen is being weaned.  Assessment & Plan: Principal Problem:   Acute respiratory failure with hypoxia (HCC) Active Problems:   Hyperlipidemia   GERD (gastroesophageal reflux disease)   Prostate cancer (HCC)   Hx of radiation therapy   Pneumonia due to COVID-19 virus  Acute hypoxic respiratory failure due to covid-19 pneumonia:  - Continue IS/proning and wean oxygen as tolerated - Trend inflammatory markers. Improving. - Continue steroids - Monitor LFTs, consider restarting remdesivir if ALT becomes <5x ULN (has received 2 doses)  LFT elevation: Due to viral illness and possibly remdesivir - Continue trending.  Elevated BP: No formal Dx HTN - Continue to monitor and treat as indicated  Symmetric leg swelling, acute on chronic, improving.  - Given lasix x1, will monitor.  Obesity:  - Weight loss ultimately recommended  GERD:  - Continue PPI  Prostate CA: s/p XRT.  - Continue outpatient management.  DVT prophylaxis: Lovenox Code Status: Full Family Communication: Patient to relay POC to family Disposition Plan: Home once improved.  Consultants:   None  Procedures:   None  Antimicrobials:  Remdesivir   Subjective: Shortness of breath is much improved from admission, able to walk around with moderate SOB. No chest pain, leg swelling improved.   Objective: Vitals:   04/23/19 0322 04/23/19 0500 04/23/19 0924 04/23/19 1200  BP:  105/64 (!) 141/94   Pulse:   96   Resp:  20     Temp: 97.9 F (36.6 C) 99 F (37.2 C)  97.6 F (36.4 C)  TempSrc: Oral Oral  Axillary  SpO2:   94%   Weight:      Height:        Intake/Output Summary (Last 24 hours) at 04/23/2019 1558 Last data filed at 04/23/2019 0900 Gross per 24 hour  Intake -  Output 150 ml  Net -150 ml   Filed Weights   04/20/19 1315  Weight: 93 kg    Gen: 68 y.o. male in no distress  Pulm: Non-labored breathing, tachypneic at rest. Crackles bilaterally.  CV: Regular rate and rhythm. No murmur, rub, or gallop. No JVD, no significant pedal edema. GI: Abdomen soft, non-tender, non-distended, with normoactive bowel sounds. No organomegaly or masses felt. Ext: Warm, no deformities Skin: No rashes, lesions or ulcers Neuro: Alert and oriented. No focal neurological deficits. Psych: Judgement and insight appear normal. Mood & affect appropriate.   Data Reviewed: I have personally reviewed following labs and imaging studies  CBC: Recent Labs  Lab 04/19/19 1746 04/20/19 0725 04/21/19 0145 04/22/19 0145 04/23/19 0259  WBC 10.2 8.7 9.9 8.7 10.1  NEUTROABS 8.6* 7.6 8.4*  --   --   HGB 14.9 13.9 13.7 13.4 13.6  HCT 46.2 44.3 42.8 42.6 43.2  MCV 93.1 94.7 94.9 94.2 92.9  PLT 331 328 381 441* 287*   Basic Metabolic Panel: Recent Labs  Lab 04/19/19 1746 04/20/19 0725 04/21/19 0145 04/22/19 0145 04/23/19 0259  NA 141 144 142 144 143  K 3.8 4.1 3.9 4.1 3.7  CL 105  107 105 106 105  CO2 24 28 25 27 26   GLUCOSE 113* 159* 143* 136* 135*  BUN 17 18 26* 28* 30*  CREATININE 1.02 0.84 0.88 0.94 0.91  CALCIUM 8.7* 8.2* 8.5* 8.7* 8.5*  MG  --   --   --   --  2.0   GFR: Estimated Creatinine Clearance: 75.3 mL/min (by C-G formula based on SCr of 0.91 mg/dL). Liver Function Tests: Recent Labs  Lab 04/19/19 1746 04/20/19 0725 04/21/19 0145 04/22/19 0145 04/23/19 0259  AST 37 27 31 156* 121*  ALT 59* 45* 48* 241* 345*  ALKPHOS 78 66 60 74 73  BILITOT 0.7 0.5 0.5 0.4 0.3  PROT 8.1 7.2 7.0 6.9 6.6   ALBUMIN 3.6 2.9* 3.0* 3.0* 2.9*   No results for input(s): LIPASE, AMYLASE in the last 168 hours. No results for input(s): AMMONIA in the last 168 hours. Coagulation Profile: Recent Labs  Lab 04/19/19 1746  INR 1.0   Cardiac Enzymes: No results for input(s): CKTOTAL, CKMB, CKMBINDEX, TROPONINI in the last 168 hours. BNP (last 3 results) No results for input(s): PROBNP in the last 8760 hours. HbA1C: No results for input(s): HGBA1C in the last 72 hours. CBG: No results for input(s): GLUCAP in the last 168 hours. Lipid Profile: No results for input(s): CHOL, HDL, LDLCALC, TRIG, CHOLHDL, LDLDIRECT in the last 72 hours. Thyroid Function Tests: No results for input(s): TSH, T4TOTAL, FREET4, T3FREE, THYROIDAB in the last 72 hours. Anemia Panel: Recent Labs    04/22/19 0145 04/23/19 0259  FERRITIN 1,684* 1,370*   Urine analysis:    Component Value Date/Time   APPEARANCEUR Clear 11/11/2018 1005   GLUCOSEU Negative 11/11/2018 1005   BILIRUBINUR Negative 11/11/2018 1005   PROTEINUR Negative 11/11/2018 1005   NITRITE Negative 11/11/2018 1005   LEUKOCYTESUR Negative 11/11/2018 1005   Recent Results (from the past 240 hour(s))  Culture, blood (Routine x 2)     Status: None (Preliminary result)   Collection Time: 04/19/19  5:46 PM   Specimen: BLOOD  Result Value Ref Range Status   Specimen Description BLOOD RIGHT ANTECUBITAL  Final   Special Requests   Final    BOTTLES DRAWN AEROBIC AND ANAEROBIC Blood Culture adequate volume   Culture   Final    NO GROWTH 4 DAYS Performed at [facility], [address], [place], [place] [address]    Report Status PENDING  Incomplete  Culture, blood (Routine x 2)     Status: None (Preliminary result)   Collection Time: 04/19/19  6:05 PM   Specimen: BLOOD  Result Value Ref Range Status   Specimen Description BLOOD LEFT ANTECUBITAL  Final   Special Requests   Final    BOTTLES DRAWN AEROBIC AND ANAEROBIC Blood Culture adequate  volume   Culture   Final    NO GROWTH 4 DAYS Performed at [facility], [address], [place], [place] [address]    Report Status PENDING  Incomplete      Radiology Studies: No results found.  Scheduled Meds: . cholecalciferol  5,000 Units Oral Daily  . dexamethasone  6 mg Oral Daily  . enoxaparin (LOVENOX) injection  0.5 mg/kg Subcutaneous Q24H  . pantoprazole  40 mg Oral Daily  . senna-docusate  2 tablet Oral BID  . sodium chloride flush  3 mL Intravenous Q12H  . vitamin C  250 mg Oral Daily   Continuous Infusions: . sodium chloride 250 mL (04/20/19 0404)     LOS: 3 days  Time spent: 25 minutes.  Patrecia Pour, MD [facility] [email or web address] Password [facility] 04/23/2019, 3:58 PM

## 2019-04-24 LAB — CBC WITH DIFFERENTIAL/PLATELET
Abs Immature Granulocytes: 1.22 10*3/uL — ABNORMAL HIGH (ref 0.00–0.07)
Basophils Absolute: 0 10*3/uL (ref 0.0–0.1)
Basophils Relative: 0 %
Eosinophils Absolute: 0 10*3/uL (ref 0.0–0.5)
Eosinophils Relative: 0 %
HCT: 42.4 % (ref 39.0–52.0)
Hemoglobin: 13.9 g/dL (ref 13.0–17.0)
Immature Granulocytes: 10 %
Lymphocytes Relative: 4 %
Lymphs Abs: 0.5 10*3/uL — ABNORMAL LOW (ref 0.7–4.0)
MCH: 30.4 pg (ref 26.0–34.0)
MCHC: 32.8 g/dL (ref 30.0–36.0)
MCV: 92.8 fL (ref 80.0–100.0)
Monocytes Absolute: 1.2 10*3/uL — ABNORMAL HIGH (ref 0.1–1.0)
Monocytes Relative: 10 %
Neutro Abs: 9.2 10*3/uL — ABNORMAL HIGH (ref 1.7–7.7)
Neutrophils Relative %: 76 %
Platelets: 461 10*3/uL — ABNORMAL HIGH (ref 150–400)
RBC: 4.57 MIL/uL (ref 4.22–5.81)
RDW: 13.9 % (ref 11.5–15.5)
WBC: 12.1 10*3/uL — ABNORMAL HIGH (ref 4.0–10.5)
nRBC: 0.2 % (ref 0.0–0.2)

## 2019-04-24 LAB — COMPREHENSIVE METABOLIC PANEL
ALT: 270 U/L — ABNORMAL HIGH (ref 0–44)
AST: 52 U/L — ABNORMAL HIGH (ref 15–41)
Albumin: 2.9 g/dL — ABNORMAL LOW (ref 3.5–5.0)
Alkaline Phosphatase: 64 U/L (ref 38–126)
Anion gap: 13 (ref 5–15)
BUN: 26 mg/dL — ABNORMAL HIGH (ref 8–23)
CO2: 26 mmol/L (ref 22–32)
Calcium: 8.4 mg/dL — ABNORMAL LOW (ref 8.9–10.3)
Chloride: 104 mmol/L (ref 98–111)
Creatinine, Ser: 0.87 mg/dL (ref 0.61–1.24)
GFR calc Af Amer: >60 mL/min (ref >60)
GFR calc non Af Amer: >60 mL/min (ref >60)
Glucose, Bld: 107 mg/dL — ABNORMAL HIGH (ref 70–99)
Potassium: 3.6 mmol/L (ref 3.5–5.1)
Sodium: 143 mmol/L (ref 135–145)
Total Bilirubin: 0.5 mg/dL (ref 0.3–1.2)
Total Protein: 6.3 g/dL — ABNORMAL LOW (ref 6.5–8.1)

## 2019-04-24 LAB — CULTURE, BLOOD (ROUTINE X 2)
Culture: NO GROWTH
Culture: NO GROWTH
Special Requests: ADEQUATE
Special Requests: ADEQUATE

## 2019-04-24 LAB — C-REACTIVE PROTEIN: CRP: 1.3 mg/dL — ABNORMAL HIGH (ref <1.0)

## 2019-04-24 LAB — FERRITIN: Ferritin: 1057 ng/mL — ABNORMAL HIGH (ref 24–336)

## 2019-04-24 NOTE — Progress Notes (Signed)
PROGRESS NOTE  Preston Thomas  IRJ:[id number] DOB: 1950-12-05 DOA: 04/20/2019 PCP: Pleas [name], NP  Brief Narrative: Preston Thomas is a 68 y.o. male with a history of prostate CA s/p recent XRT and hormone therapy, obesity and GERD who was admitted 7/23 for hypoxic respiratory failure due to covid-19 infection which had been diagnosed on 7/20. He reports symptoms beginning on 7/13. Remdesivir given though LFTs increased, so it was stopped. Steroids continue and oxygen is being weaned.  Assessment & Plan: Principal Problem:   Acute respiratory failure with hypoxia (HCC) Active Problems:   Hyperlipidemia   GERD (gastroesophageal reflux disease)   Prostate cancer (HCC)   Hx of radiation therapy   Pneumonia due to COVID-19 virus  Acute hypoxic respiratory failure due to covid-19 pneumonia:  - Continue IS/proning and wean oxygen as tolerated - Ambulatory pulse oximetry today. - Trend inflammatory markers. Improving. CRP has gone 25 > 1. D-dimer remains negative. - Continue steroids - Blood cultures negative. - Monitor LFTs which are improving, consider restarting remdesivir if ALT becomes <5x ULN (has received 2 doses).   LFT elevation: Due to viral illness and possibly remdesivir - Continue trending. AST improving more quickly than ALT as expected, ALT still ~6x ULN.  Elevated BP: No formal Dx HTN. Not in severe range.  - Continue to monitor and treat as indicated  Symmetric leg swelling, acute on chronic, improving.  - Given lasix x1, will monitor.  Obesity:  - Weight loss ultimately recommended  GERD:  - Continue PPI  Prostate CA: s/p XRT.  - Continue outpatient management.  DVT prophylaxis: Lovenox Code Status: Full Family Communication: Patient to relay POC to family. Disposition Plan: Home once improved.  Consultants:   None  Procedures:   None  Antimicrobials:  Remdesivir   Subjective: No chest pain, shortness of breath is  improved overall, still moderate with exertion.    Objective: Vitals:   04/24/19 0400 04/24/19 0428 04/24/19 0500 04/24/19 0723  BP:  (!) 150/92  (!) 142/94  Pulse: 68  73   Resp:    16  Temp:  97.7 F (36.5 C)  98.3 F (36.8 C)  TempSrc:  Oral  Oral  SpO2: 94%  92%   Weight:      Height:       Gen: 68 y.o. male in no distress Pulm: Nonlabored breathing room air. better aeration at bases. CV: Regular rate and rhythm. No murmur, rub, or gallop. No JVD, no dependent edema. GI: Abdomen soft, non-tender, non-distended, with normoactive bowel sounds.  Ext: Warm, no deformities Skin: No rashes, lesions or ulcers on visualized skin. Neuro: Alert and oriented. No focal neurological deficits. Psych: Judgement and insight appear fair. Mood euthymic & affect congruent. Behavior is appropriate.    Data Reviewed: I have personally reviewed following labs and imaging studies  CBC: Recent Labs  Lab 04/19/19 1746 04/20/19 0725 04/21/19 0145 04/22/19 0145 04/23/19 0259 04/24/19 0400  WBC 10.2 8.7 9.9 8.7 10.1 12.1*  NEUTROABS 8.6* 7.6 8.4*  --   --  9.2*  HGB 14.9 13.9 13.7 13.4 13.6 13.9  HCT 46.2 44.3 42.8 42.6 43.2 42.4  MCV 93.1 94.7 94.9 94.2 92.9 92.8  PLT 331 328 381 441* 488* 301*   Basic Metabolic Panel: Recent Labs  Lab 04/20/19 0725 04/21/19 0145 04/22/19 0145 04/23/19 0259 04/24/19 0400  NA 144 142 144 143 143  K 4.1 3.9 4.1 3.7 3.6  CL 107 105 106 105 104  CO2 28 25 27 26 26   GLUCOSE 159* 143* 136* 135* 107*  BUN 18 26* 28* 30* 26*  CREATININE 0.84 0.88 0.94 0.91 0.87  CALCIUM 8.2* 8.5* 8.7* 8.5* 8.4*  MG  --   --   --  2.0  --    GFR: Estimated Creatinine Clearance: 78.8 mL/min (by C-G formula based on SCr of 0.87 mg/dL). Liver Function Tests: Recent Labs  Lab 04/20/19 0725 04/21/19 0145 04/22/19 0145 04/23/19 0259 04/24/19 0400  AST 27 31 156* 121* 52*  ALT 45* 48* 241* 345* 270*  ALKPHOS 66 60 74 73 64  BILITOT 0.5 0.5 0.4 0.3 0.5  PROT 7.2  7.0 6.9 6.6 6.3*  ALBUMIN 2.9* 3.0* 3.0* 2.9* 2.9*   Coagulation Profile: Recent Labs  Lab 04/19/19 1746  INR 1.0   Anemia Panel: Recent Labs    04/23/19 0259 04/24/19 0400  FERRITIN 1,370* 1,057*   Urine analysis:    Component Value Date/Time   APPEARANCEUR Clear 11/11/2018 1005   GLUCOSEU Negative 11/11/2018 1005   BILIRUBINUR Negative 11/11/2018 1005   PROTEINUR Negative 11/11/2018 1005   NITRITE Negative 11/11/2018 1005   LEUKOCYTESUR Negative 11/11/2018 1005   Recent Results (from the past 240 hour(s))  Culture, blood (Routine x 2)     Status: None   Collection Time: 04/19/19  5:46 PM   Specimen: BLOOD  Result Value Ref Range Status   Specimen Description BLOOD RIGHT ANTECUBITAL  Final   Special Requests   Final    BOTTLES DRAWN AEROBIC AND ANAEROBIC Blood Culture adequate volume   Culture   Final    NO GROWTH 5 DAYS Performed at [facility], [address] [place], [place] [address]    Report Status 04/24/2019 FINAL  Final  Culture, blood (Routine x 2)     Status: None   Collection Time: 04/19/19  6:05 PM   Specimen: BLOOD  Result Value Ref Range Status   Specimen Description BLOOD LEFT ANTECUBITAL  Final   Special Requests   Final    BOTTLES DRAWN AEROBIC AND ANAEROBIC Blood Culture adequate volume   Culture   Final    NO GROWTH 5 DAYS Performed at [facility], [address] [place], [place] [address]    Report Status 04/24/2019 FINAL  Final      Scheduled Meds: . cholecalciferol  5,000 Units Oral Daily  . dexamethasone  6 mg Oral Daily  . enoxaparin (LOVENOX) injection  0.5 mg/kg Subcutaneous Q24H  . pantoprazole  40 mg Oral Daily  . senna-docusate  2 tablet Oral BID  . sodium chloride flush  3 mL Intravenous Q12H  . vitamin C  250 mg Oral Daily   Continuous Infusions: . sodium chloride 250 mL (04/20/19 0404)     LOS: 4 days   Time spent: 25 minutes.  Patrecia Pour, MD [facility] [email or web address] Password  [facility] 04/24/2019, 11:16 AM

## 2019-04-24 NOTE — Care Management Important Message (Signed)
Important Message  Patient Details  Name: Preston Thomas MRN: [id number] Date of Birth: Apr 23, 1951 .  Medicare Important Message Given:  Yes - Important Message mailed due to current National Emergency   Verbal consent obtained due to current National Emergency  Relationship to patient: Spouse/Significant Other Contact Name: CATARINO VOLD Call Date: 04/24/19  Time: 1613 Phone: [phone] Outcome: Spoke with contact Important Message mailed to: Patient address on file      Maribel Luis [address] 04/24/2019, 4:13 PM

## 2019-04-24 NOTE — Progress Notes (Signed)
O2 sat 84 % on room air when walking in hall.

## 2019-04-24 NOTE — Progress Notes (Signed)
Spoke with patients emergency contact [place] and updated her on patient plan of care, status and answered all questions.

## 2019-04-25 LAB — COMPREHENSIVE METABOLIC PANEL
ALT: 285 U/L — ABNORMAL HIGH (ref 0–44)
AST: 61 U/L — ABNORMAL HIGH (ref 15–41)
Albumin: 3.3 g/dL — ABNORMAL LOW (ref 3.5–5.0)
Alkaline Phosphatase: 69 U/L (ref 38–126)
Anion gap: 11 (ref 5–15)
BUN: 24 mg/dL — ABNORMAL HIGH (ref 8–23)
CO2: 25 mmol/L (ref 22–32)
Calcium: 8.6 mg/dL — ABNORMAL LOW (ref 8.9–10.3)
Chloride: 106 mmol/L (ref 98–111)
Creatinine, Ser: 0.96 mg/dL (ref 0.61–1.24)
GFR calc Af Amer: >60 mL/min (ref >60)
GFR calc non Af Amer: >60 mL/min (ref >60)
Glucose, Bld: 113 mg/dL — ABNORMAL HIGH (ref 70–99)
Potassium: 3.4 mmol/L — ABNORMAL LOW (ref 3.5–5.1)
Sodium: 142 mmol/L (ref 135–145)
Total Bilirubin: 0.8 mg/dL (ref 0.3–1.2)
Total Protein: 7.1 g/dL (ref 6.5–8.1)

## 2019-04-25 LAB — CBC WITH DIFFERENTIAL/PLATELET
Abs Immature Granulocytes: 1.51 10*3/uL — ABNORMAL HIGH (ref 0.00–0.07)
Basophils Absolute: 0 10*3/uL (ref 0.0–0.1)
Basophils Relative: 0 %
Eosinophils Absolute: 0.1 10*3/uL (ref 0.0–0.5)
Eosinophils Relative: 0 %
HCT: 46.6 % (ref 39.0–52.0)
Hemoglobin: 15.4 g/dL (ref 13.0–17.0)
Immature Granulocytes: 11 %
Lymphocytes Relative: 5 %
Lymphs Abs: 0.7 10*3/uL (ref 0.7–4.0)
MCH: 30.3 pg (ref 26.0–34.0)
MCHC: 33 g/dL (ref 30.0–36.0)
MCV: 91.7 fL (ref 80.0–100.0)
Monocytes Absolute: 0.9 10*3/uL (ref 0.1–1.0)
Monocytes Relative: 6 %
Neutro Abs: 11.1 10*3/uL — ABNORMAL HIGH (ref 1.7–7.7)
Neutrophils Relative %: 78 %
Platelets: 564 10*3/uL — ABNORMAL HIGH (ref 150–400)
RBC: 5.08 MIL/uL (ref 4.22–5.81)
RDW: 13.7 % (ref 11.5–15.5)
WBC: 14.3 10*3/uL — ABNORMAL HIGH (ref 4.0–10.5)
nRBC: 0.1 % (ref 0.0–0.2)

## 2019-04-25 LAB — FERRITIN: Ferritin: 1120 ng/mL — ABNORMAL HIGH (ref 24–336)

## 2019-04-25 LAB — C-REACTIVE PROTEIN: CRP: 0.9 mg/dL (ref <1.0)

## 2019-04-25 MED ORDER — METHYLPREDNISOLONE SODIUM SUCC 125 MG IJ SOLR
60.0000 mg | Freq: Two times a day (BID) | INTRAMUSCULAR | Status: DC
  Administered 2019-04-25 – 2019-04-26 (×2): 60 mg via INTRAVENOUS
  Filled 2019-04-25 (×2): qty 2

## 2019-04-25 MED ORDER — POTASSIUM CHLORIDE CRYS ER 20 MEQ PO TBCR
20.0000 meq | EXTENDED_RELEASE_TABLET | Freq: Once | ORAL | Status: AC
  Administered 2019-04-25: 20 meq via ORAL
  Filled 2019-04-25: qty 1

## 2019-04-25 MED ORDER — TRAZODONE HCL 50 MG PO TABS
50.0000 mg | ORAL_TABLET | Freq: Every evening | ORAL | Status: DC | PRN
  Filled 2019-04-25: qty 1

## 2019-04-25 MED ORDER — SALINE SPRAY 0.65 % NA SOLN
1.0000 | NASAL | Status: DC | PRN
  Filled 2019-04-25: qty 44

## 2019-04-25 NOTE — Progress Notes (Addendum)
PROGRESS NOTE  Preston Thomas  LTR:[id number] DOB: [birth year] DOA: 04/20/2019 PCP: Pleas [name], NP  Brief Narrative: Preston Thomas is a 68 y.o. male with a history of prostate CA s/p recent XRT and hormone therapy, obesity and GERD who was admitted 7/23 for hypoxic respiratory failure due to covid-19 infection which had been diagnosed on 7/20. He reports symptoms beginning on 7/13. Remdesivir given though LFTs increased, so it was stopped after 2 doses. Steroids continue with improvement in inflammatory markers, though hypoxia continues.   Assessment & Plan: Principal Problem:   Acute respiratory failure with hypoxia (HCC) Active Problems:   Hyperlipidemia   GERD (gastroesophageal reflux disease)   Prostate cancer (HCC)   Hx of radiation therapy   Pneumonia due to COVID-19 virus  Acute hypoxic respiratory failure due to covid-19 pneumonia:  - Continue IS/proning and wean oxygen as tolerated - Augment steroids to solumedrol IV - Trend inflammatory markers. Improving. CRP has gone from 25 to undetectable. - Continue steroids - Blood cultures negative. - Monitor LFTs, unable to restart remdesivir.   LFT elevation: Due to viral illness and possibly remdesivir - Continue trending. AST improving more quickly than ALT as expected, ALT still ~6x ULN.  Elevated BP: No formal Dx HTN. Not in severe range.  - Continue to monitor and treat as indicated  Symmetric leg swelling, acute on chronic, improved.  - Given lasix x1, will monitor.  Obesity:  - Weight loss ultimately recommended  GERD:  - Continue PPI  Prostate CA: s/p XRT.  - Continue outpatient management.  Hypokalemia:  - Supplement and monitor  Insomnia:  - Trazodone prn  DVT prophylaxis: Lovenox Code Status: Full Family Communication: Patient to relay POC to family. Disposition Plan: Home once improved. Wean oxygen as able.   Consultants:   None  Procedures:   None   Antimicrobials:  Remdesivir   Subjective: Unchanged shortness of breath on exertion improved with oxygen. Getting up and walking in room frequently. Was 84% on room air with ambulation yesterday. No abd pain, N/V/D or chest pain reported.  Objective: Vitals:   04/24/19 1945 04/24/19 2000 04/25/19 0410 04/25/19 0741  BP: 125/85  132/89 (!) 153/98  Pulse:  92    Resp:    18  Temp: 97.7 F (36.5 C)  98.2 F (36.8 C) 97.8 F (36.6 C)  TempSrc: Oral  Oral Oral  SpO2:  97%    Weight:      Height:       Gen: 68 y.o. male in no distress Pulm: Nonlabored breathing supplemental oxygen. Clear. CV: Regular rate and rhythm. No murmur, rub, or gallop. No JVD, no dependent edema. GI: Abdomen soft, non-tender, non-distended, with normoactive bowel sounds.  Ext: Warm, no deformities Skin: No rashes, lesions or ulcers on visualized skin. Neuro: Alert and oriented. No focal neurological deficits. Psych: Judgement and insight appear fair. Mood euthymic & affect congruent. Behavior is appropriate.    Data Reviewed: I have personally reviewed following labs and imaging studies  CBC: Recent Labs  Lab 04/19/19 1746 04/20/19 0725 04/21/19 0145 04/22/19 0145 04/23/19 0259 04/24/19 0400 04/25/19 0909  WBC 10.2 8.7 9.9 8.7 10.1 12.1* 14.3*  NEUTROABS 8.6* 7.6 8.4*  --   --  9.2* 11.1*  HGB 14.9 13.9 13.7 13.4 13.6 13.9 15.4  HCT 46.2 44.3 42.8 42.6 43.2 42.4 46.6  MCV 93.1 94.7 94.9 94.2 92.9 92.8 91.7  PLT 331 328 381 441* 488* 461* 686*   Basic Metabolic  Panel: Recent Labs  Lab 04/21/19 0145 04/22/19 0145 04/23/19 0259 04/24/19 0400 04/25/19 0909  NA 142 144 143 143 142  K 3.9 4.1 3.7 3.6 3.4*  CL 105 106 105 104 106  CO2 25 27 26 26 25   GLUCOSE 143* 136* 135* 107* 113*  BUN 26* 28* 30* 26* 24*  CREATININE 0.88 0.94 0.91 0.87 0.96  CALCIUM 8.5* 8.7* 8.5* 8.4* 8.6*  MG  --   --  2.0  --   --    GFR: Estimated Creatinine Clearance: 71.4 mL/min (by C-G formula based on SCr of  0.96 mg/dL). Liver Function Tests: Recent Labs  Lab 04/21/19 0145 04/22/19 0145 04/23/19 0259 04/24/19 0400 04/25/19 0909  AST 31 156* 121* 52* 61*  ALT 48* 241* 345* 270* 285*  ALKPHOS 60 74 73 64 69  BILITOT 0.5 0.4 0.3 0.5 0.8  PROT 7.0 6.9 6.6 6.3* 7.1  ALBUMIN 3.0* 3.0* 2.9* 2.9* 3.3*   Coagulation Profile: Recent Labs  Lab 04/19/19 1746  INR 1.0   Anemia Panel: Recent Labs    04/24/19 0400 04/25/19 0909  FERRITIN 1,057* 1,120*   Urine analysis:    Component Value Date/Time   APPEARANCEUR Clear 11/11/2018 1005   GLUCOSEU Negative 11/11/2018 1005   BILIRUBINUR Negative 11/11/2018 1005   PROTEINUR Negative 11/11/2018 1005   NITRITE Negative 11/11/2018 1005   LEUKOCYTESUR Negative 11/11/2018 1005   Recent Results (from the past 240 hour(s))  Culture, blood (Routine x 2)     Status: None   Collection Time: 04/19/19  5:46 PM   Specimen: BLOOD  Result Value Ref Range Status   Specimen Description BLOOD RIGHT ANTECUBITAL  Final   Special Requests   Final    BOTTLES DRAWN AEROBIC AND ANAEROBIC Blood Culture adequate volume   Culture   Final    NO GROWTH 5 DAYS Performed at [facility], [address], [place], [place] [address]    Report Status 04/24/2019 FINAL  Final  Culture, blood (Routine x 2)     Status: None   Collection Time: 04/19/19  6:05 PM   Specimen: BLOOD  Result Value Ref Range Status   Specimen Description BLOOD LEFT ANTECUBITAL  Final   Special Requests   Final    BOTTLES DRAWN AEROBIC AND ANAEROBIC Blood Culture adequate volume   Culture   Final    NO GROWTH 5 DAYS Performed at [facility], [address] [place], [place] [address]    Report Status 04/24/2019 FINAL  Final      Scheduled Meds: . cholecalciferol  5,000 Units Oral Daily  . dexamethasone  6 mg Oral Daily  . enoxaparin (LOVENOX) injection  0.5 mg/kg Subcutaneous Q24H  . pantoprazole  40 mg Oral Daily  . potassium chloride  20 mEq Oral Once  .  senna-docusate  2 tablet Oral BID  . sodium chloride flush  3 mL Intravenous Q12H  . vitamin C  250 mg Oral Daily   Continuous Infusions: . sodium chloride 250 mL (04/20/19 0404)     LOS: 5 days   Time spent: 25 minutes.  Patrecia Pour, MD [facility] [email or web address] Password [email or web address] 04/25/2019, 11:40 AM

## 2019-04-26 LAB — CBC WITH DIFFERENTIAL/PLATELET
Abs Immature Granulocytes: 1.14 10*3/uL — ABNORMAL HIGH (ref 0.00–0.07)
Basophils Absolute: 0.1 10*3/uL (ref 0.0–0.1)
Basophils Relative: 1 %
Eosinophils Absolute: 0 10*3/uL (ref 0.0–0.5)
Eosinophils Relative: 0 %
HCT: 43.1 % (ref 39.0–52.0)
Hemoglobin: 13.8 g/dL (ref 13.0–17.0)
Immature Granulocytes: 9 %
Lymphocytes Relative: 4 %
Lymphs Abs: 0.5 10*3/uL — ABNORMAL LOW (ref 0.7–4.0)
MCH: 29.8 pg (ref 26.0–34.0)
MCHC: 32 g/dL (ref 30.0–36.0)
MCV: 93.1 fL (ref 80.0–100.0)
Monocytes Absolute: 0.5 10*3/uL (ref 0.1–1.0)
Monocytes Relative: 4 %
Neutro Abs: 10.5 10*3/uL — ABNORMAL HIGH (ref 1.7–7.7)
Neutrophils Relative %: 82 %
Platelets: 509 10*3/uL — ABNORMAL HIGH (ref 150–400)
RBC: 4.63 MIL/uL (ref 4.22–5.81)
RDW: 13.7 % (ref 11.5–15.5)
WBC: 12.6 10*3/uL — ABNORMAL HIGH (ref 4.0–10.5)
nRBC: 0 % (ref 0.0–0.2)

## 2019-04-26 LAB — COMPREHENSIVE METABOLIC PANEL
ALT: 254 U/L — ABNORMAL HIGH (ref 0–44)
AST: 39 U/L (ref 15–41)
Albumin: 3 g/dL — ABNORMAL LOW (ref 3.5–5.0)
Alkaline Phosphatase: 64 U/L (ref 38–126)
Anion gap: 12 (ref 5–15)
BUN: 23 mg/dL (ref 8–23)
CO2: 25 mmol/L (ref 22–32)
Calcium: 8.2 mg/dL — ABNORMAL LOW (ref 8.9–10.3)
Chloride: 103 mmol/L (ref 98–111)
Creatinine, Ser: 0.82 mg/dL (ref 0.61–1.24)
GFR calc Af Amer: >60 mL/min (ref >60)
GFR calc non Af Amer: >60 mL/min (ref >60)
Glucose, Bld: 135 mg/dL — ABNORMAL HIGH (ref 70–99)
Potassium: 4.1 mmol/L (ref 3.5–5.1)
Sodium: 140 mmol/L (ref 135–145)
Total Bilirubin: 0.4 mg/dL (ref 0.3–1.2)
Total Protein: 6.3 g/dL — ABNORMAL LOW (ref 6.5–8.1)

## 2019-04-26 LAB — D-DIMER, QUANTITATIVE: D-Dimer, Quant: 0.38 ug/mL-FEU (ref 0.00–0.50)

## 2019-04-26 LAB — C-REACTIVE PROTEIN: CRP: <0.8 mg/dL (ref <1.0)

## 2019-04-26 LAB — FERRITIN: Ferritin: 986 ng/mL — ABNORMAL HIGH (ref 24–336)

## 2019-04-26 MED ORDER — DEXAMETHASONE 6 MG PO TABS: 6.0000 mg | ORAL_TABLET | Freq: Every day | ORAL | 0 refills | Status: AC

## 2019-04-26 NOTE — Discharge Summary (Signed)
Physician Discharge Summary  Preston Thomas HEN:[id number] DOB: September 09, 1951 DOA: 04/20/2019  PCP: Pleas [name], NP  Admit date: 04/20/2019 Discharge date: 04/26/2019  Admitted From: Home Disposition: Home   Recommendations for Outpatient Follow-up:  1. Follow up with PCP in 1-2 weeks 2. Please obtain CMP/CBC in one week. Consider further evaluation if ALT remains elevated. Currently modestly elevated likely due to viral infection.   Home Health: None Equipment/Devices: 2L O2 Discharge Condition: Stable CODE STATUS: Full Diet recommendation: Heart healthy  Brief/Interim Summary: Preston Thomas is a 68 y.o. male with a history of prostate CA s/p recent XRT and hormone therapy, obesity and GERD who was admitted 7/23 for hypoxic respiratory failure due to covid-19 infection which had been diagnosed on 7/20. He reports symptoms beginning on 7/13. Remdesivir given though LFTs increased, so it was stopped after 2 doses. Steroids were continued with improvement in inflammatory markers and decreased oxygen demand. He is functionally stable, clinically improved, and has maximized benefit of hospitalization.   Discharge Diagnoses:  Principal Problem:   Acute respiratory failure with hypoxia (HCC) Active Problems:   Hyperlipidemia   GERD (gastroesophageal reflux disease)   Prostate cancer (HCC)   Hx of radiation therapy   Pneumonia due to COVID-19 virus  Acute hypoxic respiratory failure due to covid-19 pneumonia: Now outside of 2nd week since symptoms, inflammatory markers improved, taking po well. Will discharge on po steroids. Will require supplemental oxygen at discharge. CRP has gone from 25 to undetectable. - Monitor LFTs, unable to restart remdesivir.   LFT elevation: Due to viral illness and possibly remdesivir. Has remained stable.  - Recheck at follow up.   Elevated BP: No formal Dx HTN. Not in severe range.  - Continue to monitor and treat as  indicated  Symmetric leg swelling, acute on chronic, improved.  - Given lasix x1.  Obesity: BMI is 41. - Weight loss ultimately recommended  GERD:  - Continue PPI  Prostate CA: s/p XRT.  - Continue outpatient management.  Discharge Instructions Discharge Instructions    Discharge instructions   Complete by: As directed    You are being discharged from the hospital after treatment for covid-19 infection. You are felt to be stable enough to no longer require inpatient monitoring, testing, and treatment, though you will need to follow the recommendations below: - Based on the CDC's non-test criteria for ending self-isolation, you are unlikely to be contagious 21 days after symptom onset. Out of on abundance of caution, self-isolation is recommended for another 2 weeks after discharge.  - continue taking steroids, decadron 6mg  daily for 3 more days has been sent to your pharmacy - You will use supplemental oxygen at home for a short time, this will be arranged prior to discharge.  - Do not take NSAID medications (including, but not limited to, ibuprofen, advil, motrin, naproxen, aleve, goody's powder, etc.) - Follow up with your doctor in the next week via telehealth or seek medical attention right away if your symptoms get WORSE.  - Consider donating plasma after you have recovered (either 14 days after a negative test or 28 days after symptoms have completely resolved) because your antibodies to this virus may be helpful to give to others with life-threatening infections. Please go to the website [email or web address] if you would like to consider volunteering for plasma donation.    Directions for you at home:  Wear a facemask You should wear a facemask that covers your nose and mouth when you are  in the same room with other people and when you visit a healthcare provider. People who live with or visit you should also wear a facemask while they are in the same room with you.  Separate  yourself from other people in your home As much as possible, you should stay in a different room from other people in your home. Also, you should use a separate bathroom, if available.  Avoid sharing household items You should not share dishes, drinking glasses, cups, eating utensils, towels, bedding, or other items with other people in your home. After using these items, you should wash them thoroughly with soap and water.  Cover your coughs and sneezes Cover your mouth and nose with a tissue when you cough or sneeze, or you can cough or sneeze into your sleeve. Throw used tissues in a lined trash can, and immediately wash your hands with soap and water for at least 20 seconds or use an alcohol-based hand rub.  Wash your [facility] your hands often and thoroughly with soap and water for at least 20 seconds. You can use an alcohol-based hand sanitizer if soap and water are not available and if your hands are not visibly dirty. Avoid touching your eyes, nose, and mouth with unwashed hands.  Directions for those who live with, or provide care at home for you:  Limit the number of people who have contact with the patient If possible, have only one caregiver for the patient. Other household members should stay in another home or place of residence. If this is not possible, they should stay in another room, or be separated from the patient as much as possible. Use a separate bathroom, if available. Restrict visitors who do not have an essential need to be in the home.  Ensure good ventilation Make sure that shared spaces in the home have good air flow, such as from an air conditioner or an opened window, weather permitting.  Wash your hands often Wash your hands often and thoroughly with soap and water for at least 20 seconds. You can use an alcohol based hand sanitizer if soap and water are not available and if your hands are not visibly dirty. Avoid touching your eyes, nose, and mouth  with unwashed hands. Use disposable paper towels to dry your hands. If not available, use dedicated cloth towels and replace them when they become wet.  Wear a facemask and gloves Wear a disposable facemask at all times in the room and gloves when you touch or have contact with the patient's blood, body fluids, and/or secretions or excretions, such as sweat, saliva, sputum, nasal mucus, vomit, urine, or feces.  Ensure the mask fits over your nose and mouth tightly, and do not touch it during use. Throw out disposable facemasks and gloves after using them. Do not reuse. Wash your hands immediately after removing your facemask and gloves. If your personal clothing becomes contaminated, carefully remove clothing and launder. Wash your hands after handling contaminated clothing. Place all used disposable facemasks, gloves, and other waste in a lined container before disposing them with other household waste. Remove gloves and wash your hands immediately after handling these items.  Do not share dishes, glasses, or other household items with the patient Avoid sharing household items. You should not share dishes, drinking glasses, cups, eating utensils, towels, bedding, or other items with a patient who is confirmed to have, or being evaluated for, COVID-19 infection. After the person uses these items, you should wash them thoroughly  with soap and water.  Wash laundry thoroughly Immediately remove and wash clothes or bedding that have blood, body fluids, and/or secretions or excretions, such as sweat, saliva, sputum, nasal mucus, vomit, urine, or feces, on them. Wear gloves when handling laundry from the patient. Read and follow directions on labels of laundry or clothing items and detergent. In general, wash and dry with the warmest temperatures recommended on the label.  Clean all areas the individual has used often Clean all touchable surfaces, such as counters, tabletops, doorknobs, bathroom  fixtures, toilets, phones, keyboards, tablets, and bedside tables, every day. Also, clean any surfaces that may have blood, body fluids, and/or secretions or excretions on them. Wear gloves when cleaning surfaces the patient has come in contact with. Use a diluted bleach solution (e.g., dilute bleach with 1 part bleach and 10 parts water) or a household disinfectant with a label that says EPA-registered for coronaviruses. To make a bleach solution at home, add 1 tablespoon of bleach to 1 quart (4 cups) of water. For a larger supply, add  cup of bleach to 1 gallon (16 cups) of water. Read labels of cleaning products and follow recommendations provided on product labels. Labels contain instructions for safe and effective use of the cleaning product including precautions you should take when applying the product, such as wearing gloves or eye protection and making sure you have good ventilation during use of the product. Remove gloves and wash hands immediately after cleaning.  Monitor yourself for signs and symptoms of illness Caregivers and household members are considered close contacts, should monitor their health, and will be asked to limit movement outside of the home to the extent possible. Follow the monitoring steps for close contacts listed on the symptom monitoring form.   If you have additional questions, contact your local health department or call the epidemiologist on call at [phone] (available 24/7). This guidance is subject to change. For the most up-to-date guidance from [facility], please refer to their website: [email or web address].pl   MyChart COVID-19 home monitoring program   Complete by: Apr 26, 2019    Is the patient willing to use the [place] for home monitoring?: Yes     Allergies as of 04/26/2019      Reactions   Penicillins    Passed out Did it involve swelling of the face/tongue/throat, SOB, or low BP?  Yes Did it involve sudden or severe rash/hives, skin peeling, or any reaction on the inside of your mouth or nose? No Did you need to seek medical attention at a hospital or doctor's office? No When did it last happen?30 + years If all above answers are "NO", may proceed with cephalosporin use.      Medication List    TAKE these medications   acetaminophen 500 MG tablet Commonly known as: TYLENOL Take 1,000 mg by mouth every 8 (eight) hours as needed for moderate pain.   dexamethasone 6 MG tablet Commonly known as: Decadron Take 1 tablet (6 mg total) by mouth daily for 3 days.   Dialyvite Vitamin D 5000 125 MCG (5000 UT) capsule Generic drug: Cholecalciferol Take 5,000 Units by mouth daily.   guaiFENesin 600 MG 12 hr tablet Commonly known as: Mucinex Take 1 tablet (600 mg total) by mouth 2 (two) times daily for 10 days.   loratadine 10 MG tablet Commonly known as: CLARITIN Take 10 mg by mouth daily as needed for allergies.   PriLOSEC 20 MG capsule Generic drug: omeprazole Take 20 mg by  mouth daily.   SUMAtriptan 100 MG tablet Commonly known as: IMITREX Take 1 tablet by mouth at migraine onset. May repeat in 2 hours if headache persists or recurs. Do not exceed 100 mg in 24 hours.   vitamin C 250 MG tablet Commonly known as: ASCORBIC ACID Take 250 mg by mouth daily.            Durable Medical Equipment  (From admission, onward)         Start     Ordered   04/26/19 1011  DME Oxygen  Once    Question Answer Comment  Length of Need 6 Months   Mode or (Route) Nasal cannula   Liters per Minute 2   Frequency Continuous (stationary and portable oxygen unit needed)   Oxygen delivery system Gas      04/26/19 1012          Allergies  Allergen Reactions  . Penicillins     Passed out Did it involve swelling of the face/tongue/throat, SOB, or low BP? Yes Did it involve sudden or severe rash/hives, skin peeling, or any reaction on the inside of your  mouth or nose? No Did you need to seek medical attention at a hospital or doctor's office? No When did it last happen?30 + years If all above answers are "NO", may proceed with cephalosporin use.     Consultations:  None  Procedures/Studies: Dg Chest Portable 1 View  Result Date: 04/19/2019 CLINICAL DATA:  Continued cough and fever EXAM: PORTABLE CHEST 1 VIEW COMPARISON:  Portable exam 1732 hours compared to 04/18/2019 FINDINGS: Enlargement of cardiac silhouette. Mediastinal contours and pulmonary vascularity normal. Subsegmental atelectasis mid to lower lungs bilaterally. Increasing subtle opacity in RIGHT upper lobe suspicious for developing infiltrate. No pleural effusion or pneumothorax. Bones unremarkable. IMPRESSION: Scattered subsegmental atelectasis with question developing RIGHT upper lobe pneumonia. Electronically Signed   By: Lavonia Dana M.D.   On: 04/19/2019 18:01   Dg Chest Portable 1 View  Result Date: 04/18/2019 CLINICAL DATA:  Shortness of breath, cough.  COVID-19 positive. EXAM: PORTABLE CHEST 1 VIEW COMPARISON:  None. FINDINGS: Low lung volumes. Bibasilar atelectasis. Heart is mildly enlarged. No visible effusions or acute bony abnormality. IMPRESSION: Low lung volumes with bibasilar atelectasis. Mild cardiomegaly. Electronically Signed   By: Rolm Baptise M.D.   On: 04/18/2019 10:52     Subjective: Feels shortness of breath is significantly improved, able to get around well but hypoxic with ambulation. No chest pain or leg swelling.   Discharge Exam: Vitals:   04/26/19 0600 04/26/19 0744  BP:    Pulse: 72   Resp:  16  Temp:  98.3 F (36.8 C)  SpO2: 90%    General: Pt is alert, awake, not in acute distress Cardiovascular: RRR, S1/S2 +, no rubs, no gallops Respiratory: Nonlabored, crackles bilaterally with good air movement, no wheezes. Abdominal: Soft, NT, ND, bowel sounds + Extremities: No edema, no cyanosis  Labs: BNP (last 3 results) No results for  input(s): BNP in the last 8760 hours. Basic Metabolic Panel: Recent Labs  Lab 04/22/19 0145 04/23/19 0259 04/24/19 0400 04/25/19 0909 04/26/19 0205  NA 144 143 143 142 140  K 4.1 3.7 3.6 3.4* 4.1  CL 106 105 104 106 103  CO2 27 26 26 25 25   GLUCOSE 136* 135* 107* 113* 135*  BUN 28* 30* 26* 24* 23  CREATININE 0.94 0.91 0.87 0.96 0.82  CALCIUM 8.7* 8.5* 8.4* 8.6* 8.2*  MG  --  2.0  --   --   --  Liver Function Tests: Recent Labs  Lab 04/22/19 0145 04/23/19 0259 04/24/19 0400 04/25/19 0909 04/26/19 0205  AST 156* 121* 52* 61* 39  ALT 241* 345* 270* 285* 254*  ALKPHOS 74 73 64 69 64  BILITOT 0.4 0.3 0.5 0.8 0.4  PROT 6.9 6.6 6.3* 7.1 6.3*  ALBUMIN 3.0* 2.9* 2.9* 3.3* 3.0*   No results for input(s): LIPASE, AMYLASE in the last 168 hours. No results for input(s): AMMONIA in the last 168 hours. CBC: Recent Labs  Lab 04/20/19 0725 04/21/19 0145 04/22/19 0145 04/23/19 0259 04/24/19 0400 04/25/19 0909 04/26/19 0205  WBC 8.7 9.9 8.7 10.1 12.1* 14.3* 12.6*  NEUTROABS 7.6 8.4*  --   --  9.2* 11.1* 10.5*  HGB 13.9 13.7 13.4 13.6 13.9 15.4 13.8  HCT 44.3 42.8 42.6 43.2 42.4 46.6 43.1  MCV 94.7 94.9 94.2 92.9 92.8 91.7 93.1  PLT 328 381 441* 488* 461* 564* 509*   Cardiac Enzymes: No results for input(s): CKTOTAL, CKMB, CKMBINDEX, TROPONINI in the last 168 hours. BNP: Invalid input(s): POCBNP CBG: No results for input(s): GLUCAP in the last 168 hours. D-Dimer Recent Labs    04/26/19 0205  DDIMER 0.38   Hgb A1c No results for input(s): HGBA1C in the last 72 hours. Lipid Profile No results for input(s): CHOL, HDL, LDLCALC, TRIG, CHOLHDL, LDLDIRECT in the last 72 hours. Thyroid function studies No results for input(s): TSH, T4TOTAL, T3FREE, THYROIDAB in the last 72 hours.  Invalid input(s): FREET3 Anemia work up Recent Labs    04/25/19 0909 04/26/19 0205  FERRITIN 1,120* 986*   Urinalysis    Component Value Date/Time   APPEARANCEUR Clear 11/11/2018  1005   GLUCOSEU Negative 11/11/2018 1005   BILIRUBINUR Negative 11/11/2018 1005   PROTEINUR Negative 11/11/2018 1005   NITRITE Negative 11/11/2018 1005   LEUKOCYTESUR Negative 11/11/2018 1005    Microbiology Recent Results (from the past 240 hour(s))  Culture, blood (Routine x 2)     Status: None   Collection Time: 04/19/19  5:46 PM   Specimen: BLOOD  Result Value Ref Range Status   Specimen Description BLOOD RIGHT ANTECUBITAL  Final   Special Requests   Final    BOTTLES DRAWN AEROBIC AND ANAEROBIC Blood Culture adequate volume   Culture   Final    NO GROWTH 5 DAYS Performed at [facility], [address] [place], [place] [address]    Report Status 04/24/2019 FINAL  Final  Culture, blood (Routine x 2)     Status: None   Collection Time: 04/19/19  6:05 PM   Specimen: BLOOD  Result Value Ref Range Status   Specimen Description BLOOD LEFT ANTECUBITAL  Final   Special Requests   Final    BOTTLES DRAWN AEROBIC AND ANAEROBIC Blood Culture adequate volume   Culture   Final    NO GROWTH 5 DAYS Performed at [facility], [address] [place], [place] [address]    Report Status 04/24/2019 FINAL  Final    Time coordinating discharge: Approximately 40 minutes  Patrecia Pour, MD  [facility] 04/26/2019, 10:12 AM

## 2019-04-26 NOTE — Discharge Instructions (Signed)
COVID-19: How to Protect Yourself and Others Know how it spreads  There is currently no vaccine to prevent coronavirus disease 2019 (COVID-19).  The best way to prevent illness is to avoid being exposed to this virus.  The virus is thought to spread mainly from person-to-person. ? Between people who are in close contact with one another (within about 6 feet). ? Through respiratory droplets produced when an infected person coughs, sneezes or talks. ? These droplets can land in the mouths or noses of people who are nearby or possibly be inhaled into the lungs. ? Some recent studies have suggested that COVID-19 may be spread by people who are not showing symptoms. Everyone should Clean your hands often  Wash your hands often with soap and water for at least 20 seconds especially after you have been in a public place, or after blowing your nose, coughing, or sneezing.  If soap and water are not readily available, use a hand sanitizer that contains at least 60% alcohol. Cover all surfaces of your hands and rub them together until they feel dry.  Avoid touching your eyes, nose, and mouth with unwashed hands. Avoid close contact  Stay home if you are sick.  Avoid close contact with people who are sick.  Put distance between yourself and other people. ? Remember that some people without symptoms may be able to spread virus. ? This is especially important for people who are at higher risk of getting very sick.www.cdc.gov/coronavirus/2019-ncov/need-extra-precautions/people-at-higher-risk.html Cover your mouth and nose with a cloth face cover when around others  You could spread COVID-19 to others even if you do not feel sick.  Everyone should wear a cloth face cover when they have to go out in public, for example to the grocery store or to pick up other necessities. ? Cloth face coverings should not be placed on young children under age 2, anyone who has trouble breathing, or is unconscious,  incapacitated or otherwise unable to remove the mask without assistance.  The cloth face cover is meant to protect other people in case you are infected.  Do NOT use a facemask meant for a healthcare worker.  Continue to keep about 6 feet between yourself and others. The cloth face cover is not a substitute for social distancing. Cover coughs and sneezes  If you are in a private setting and do not have on your cloth face covering, remember to always cover your mouth and nose with a tissue when you cough or sneeze or use the inside of your elbow.  Throw used tissues in the trash.  Immediately wash your hands with soap and water for at least 20 seconds. If soap and water are not readily available, clean your hands with a hand sanitizer that contains at least 60% alcohol. Clean and disinfect  Clean AND disinfect frequently touched surfaces daily. This includes tables, doorknobs, light switches, countertops, handles, desks, phones, keyboards, toilets, faucets, and sinks. www.cdc.gov/coronavirus/2019-ncov/prevent-getting-sick/disinfecting-your-home.html  If surfaces are dirty, clean them: Use detergent or soap and water prior to disinfection.  Then, use a household disinfectant. You can see a list of EPA-registered household disinfectants here. cdc.gov/coronavirus 01/31/2019 This information is not intended to replace advice given to you by your health care provider. Make sure you discuss any questions you have with your health care provider. Document Released: 01/10/2019 Document Revised: 02/08/2019 Document Reviewed: 01/10/2019 Elsevier Patient Education  2020 Elsevier Inc.  

## 2019-04-26 NOTE — Progress Notes (Signed)
SATURATION QUALIFICATIONS: (This note is used to comply with regulatory documentation for home oxygen)  Patient Saturations on Room Air at Rest = 90%  Patient Saturations on Room Air while Ambulating = 87%  Patient Saturations on 2 Liters of oxygen while Ambulating = 92%  Please briefly explain why patient needs home oxygen: increased O2 demands

## 2019-04-26 NOTE — TOC Transition Note (Signed)
Transition of Care [facility]) - CM/SW Discharge Note   Patient Details  Name: Wynter Grave MRN: [id number] Date of Birth: 1951/08/27  Transition of Care [facility]) CM/SW Contact:  Ninfa Meeker, RN Phone Number: [phone] remotely) 04/26/2019, 10:29 AM   Clinical Narrative:  68 yr old gentleman, admitted and treated for COVID. Thankfully he is improving and will discharge home. Case manager spoke with patient's wife, Cindee via telephone concerning patient's need for oxygen at discharge. Choice for Agency offered. Referral was called to Learta Codding, [employer] [facility]iaison. Concentrator and tanks will be delivered to their home. Case manager requested  Shearon Balo at [facility] to take a tank and pulse ox to patient's room. Wife will transport home after concentrator has been delivered.     Final next level of care: Home/Self Care Barriers to Discharge: No Barriers Identified   Patient Goals and CMS Choice Patient states their goals for this hospitalization and ongoing recovery are:: to get better   Choice offered to / list presented to : Spouse  Discharge Placement                       Discharge Plan and Services In-house Referral: NA Discharge Planning Services: CM Consult Post Acute Care Choice: Durable Medical Equipment          DME Arranged: Oxygen   Date DME Agency Contacted: 04/26/19 Time DME Agency Contacted: [id number] Representative spoke with at DME Agency: Learta Codding [facility] Arranged: NA          Social Determinants of Health (Novelty) Interventions     Readmission Risk Interventions No flowsheet data found.

## 2019-04-27 ENCOUNTER — Telehealth: Payer: Self-pay

## 2019-04-27 NOTE — Telephone Encounter (Signed)
Transition Care Management Follow-up Telephone Call   Date discharged?04/26/19  *Spoke with wife, okay per patient  How have you been since you were released from the hospital? He is doing much better!   Do you understand why you were in the hospital? Yes   Do you understand the discharge instructions? Yes   Where were you discharged to? Home   Items Reviewed:  Medications reviewed: Yes  Allergies reviewed: Yes  Dietary changes reviewed: no changes  Referrals reviewed: Yes, home with oxygen and [facility] has delivered and is in place.    Functional Questionnaire:   Activities of Daily Living (ADLs):   Patient currently independent with ADL's but wife is present for support and meal prep as needed.     Any transportation issues/concerns?: NO   Any patient concerns? None   Confirmed importance and date/time of follow-up visits scheduled Yes  Provider Appointment booked with  Alma Friendly, NP Virtual follow up for 05/03/19 at 1100am.   Confirmed with patient if condition begins to worsen call PCP or go to the ER.  Patient was given the office number and encouraged to call back with question or concerns.  : Yes

## 2019-04-27 NOTE — Telephone Encounter (Signed)
Noted  

## 2019-05-03 ENCOUNTER — Ambulatory Visit (INDEPENDENT_AMBULATORY_CARE_PROVIDER_SITE_OTHER): Payer: PPO | Admitting: Primary Care

## 2019-05-03 ENCOUNTER — Encounter: Payer: Self-pay | Admitting: Primary Care

## 2019-05-03 VITALS — Wt 205.0 lb

## 2019-05-03 DIAGNOSIS — J1289 Other viral pneumonia: Secondary | ICD-10-CM | POA: Diagnosis not present

## 2019-05-03 DIAGNOSIS — U071 COVID-19: Secondary | ICD-10-CM

## 2019-05-03 DIAGNOSIS — J9601 Acute respiratory failure with hypoxia: Secondary | ICD-10-CM

## 2019-05-03 DIAGNOSIS — Z09 Encounter for follow-up examination after completed treatment for conditions other than malignant neoplasm: Secondary | ICD-10-CM | POA: Diagnosis not present

## 2019-05-03 DIAGNOSIS — J1282 Pneumonia due to coronavirus disease 2019: Secondary | ICD-10-CM

## 2019-05-03 NOTE — Assessment & Plan Note (Signed)
Seems to be improving well. Could not take Remdesivir due to elevations in LFT's. Appears well today. Continue as needed supplemental oxygen.  Repeat labs including CBC and CMP pending. Hospital notes, labs, imaging reviewed.

## 2019-05-03 NOTE — Assessment & Plan Note (Signed)
Seems to be improving, now using supplemental oxygen at home as needed.  Room air resting saturation now of 92%.  Continue oxygen as needed. He does appear stable today, he will update if anything changes. Strict ED/hospital precautions provided.

## 2019-05-03 NOTE — Progress Notes (Signed)
Subjective:    Patient ID: Preston Thomas, male    DOB: [birth year], 68 y.o.   MRN: [id number]  HPI  Virtual Visit via Video Note  I connected with [name] on 05/03/19 at 11:00 AM EDT by a video enabled telemedicine application and verified that I am speaking with the correct person using two identifiers.  Location: Patient: Home Provider: Office   I discussed the limitations of evaluation and management by telemedicine and the availability of in person appointments. The patient expressed understanding and agreed to proceed.  History of Present Illness:  [name] is a 68 year old male who presents today for [facility] Follow up.  He was initially evaluated virtually in our office on April 11, 2019 with a chief complaint of 5-day history of cough and fever.  Given his symptoms he was referred for COVID-19 testing, positive COVID-19 test resulted on April 18, 2019.  His symptoms started to progress so he was directed to the emergency department for further evaluation.  He presented to [facility] ED on April 18, 2019 with reports of continued fatigue, low-grade fever, cough.  He was found to have lower oxygen saturation 92%, also tachycardia.  Chest x-ray without acute process.  He did not meet admission criteria for COVID so he was discharged home with strict return precautions.  He returned to [facility] ED on April 19, 2019 given increased dyspnea with weakness, home oxygen saturation level of 78% on room air.  Oxygen saturation was noted to be 89% upon arrival, heart rate of 129.  Chest x-ray revealed "developing opacity".  He was treated with supplemental oxygen and admitted to [facility] for inpatient treatment.  During his admission at the [facility] he was treated with supplemental oxygen.  He was suspected to have pneumonia given chest x-ray results and was prescribed IV steroids and IV fluids. Remdesivir was initiated and was only able to receive  2 doses given elevation in LFTs.  CRP initially elevated, became undetected by April 24, 2019.  He was provided with 1 dose of Lasix given lower extremity edema.  Oxygen saturation continued to improve off and on supplemental oxygen so he was discharged home on April 26, 2019 with as needed supplemental oxygen and recommendations for PCP follow-up.  Since his discharge home he's doing much better. His lower extremity edema has improved. He continues to experience symptoms of shortness of breath with walking moderate distances, lightheadedness with positional changes and during rest, brain fog; but symptoms are much better.   He is using supplemental oxygen about 30% of the time now, was using 60% of the time initially when discharged home. Initially his oxygen saturation was running in the high 80's. Now without oxygen and resting his saturation is 92%. He is ambulating more and is able to walk around the perimeter of his house (with resting after) which is an improvement from his hospital stay.  He is eating and drinking well, no cough.   Observations/Objective:  Alert and oriented. Appears well, not sickly. No distress. Speaking in complete sentences. No cough during visit.   Assessment and Plan:  See problem based charting.  Follow Up Instructions:  Call the main line to schedule a lab only appointment for next week as discussed.  Continue to use your oxygen as needed.  Continue to work on walking and building back up strength.  Please not hesitate to call or message me if you need anything! It was  a pleasure to see you today! [name], NP-C    I discussed the assessment and treatment plan with the patient. The patient was provided an opportunity to ask questions and all were answered. The patient agreed with the plan and demonstrated an understanding of the instructions.   The patient was advised to call back or seek an in-person evaluation if the symptoms worsen or if the  condition fails to improve as anticipated.     Pleas [name], NP    Review of Systems  Constitutional: Positive for fatigue. Negative for fever.  HENT: Negative for congestion.   Respiratory: Positive for shortness of breath. Negative for cough.   Cardiovascular: Negative for chest pain.  Neurological: Positive for light-headedness. Negative for headaches.       Past Medical History:  Diagnosis Date  . COLONIC POLYPS, HX OF 08/31/2007   Qualifier: Diagnosis of  By: Silvio Pate MD, [facility]   . DIVERTICULITIS, HX OF 08/31/2007   Qualifier: Diagnosis of  By: Joesph Fillers CMA (AAMA), Ronny Bacon    . DIVERTICULOSIS, COLON 08/31/2007   Qualifier: Diagnosis of  By: Silvio Pate MD, [facility]   . GERD (gastroesophageal reflux disease)   . RENAL CALCULUS, HX OF 08/31/2007   Qualifier: Diagnosis of  By: Joesph Fillers CMA (AAMA), Natasha       Social History   Socioeconomic History  . Marital status: Married    Spouse name: Not on file  . Number of children: Not on file  . Years of education: Not on file  . Highest education level: Not on file  Occupational History  . Not on file  Social Needs  . Financial resource strain: Not on file  . Food insecurity    Worry: Not on file    Inability: Not on file  . Transportation needs    Medical: Not on file    Non-medical: Not on file  Tobacco Use  . Smoking status: Former Smoker    Years: 10.00    Types: Cigarettes    Quit date: 09/28/1984    Years since quitting: 34.6  . Smokeless tobacco: Never Used  Substance and Sexual Activity  . Alcohol use: Not Currently  . Drug use: Never  . Sexual activity: Yes  Lifestyle  . Physical activity    Days per week: Not on file    Minutes per session: Not on file  . Stress: Not on file  Relationships  . Social Herbalist on phone: Not on file    Gets together: Not on file    Attends religious service: Not on file    Active member of club or organization: Not on file    Attends meetings of  clubs or organizations: Not on file    Relationship status: Not on file  . Intimate partner violence    Fear of current or ex partner: Not on file    Emotionally abused: Not on file    Physically abused: Not on file    Forced sexual activity: Not on file  Other Topics Concern  . Not on file  Social History Narrative  . Not on file    Past Surgical History:  Procedure Laterality Date  . APPENDECTOMY  1987  . CHOLECYSTECTOMY  1987    Family History  Problem Relation Age of Onset  . Early death Father   . Kidney disease Father     Allergies  Allergen Reactions  . Penicillins     Passed out Did it  involve swelling of the face/tongue/throat, SOB, or low BP? Yes Did it involve sudden or severe rash/hives, skin peeling, or any reaction on the inside of your mouth or nose? No Did you need to seek medical attention at a hospital or doctor's office? No When did it last happen?30 + years If all above answers are "NO", may proceed with cephalosporin use.     Current Outpatient Medications on File Prior to Visit  Medication Sig Dispense Refill  . acetaminophen (TYLENOL) 500 MG tablet Take 1,000 mg by mouth every 8 (eight) hours as needed for moderate pain.    . Cholecalciferol (DIALYVITE VITAMIN D 5000) 125 MCG (5000 UT) capsule Take 5,000 Units by mouth daily.    [name] loratadine (CLARITIN) 10 MG tablet Take 10 mg by mouth daily as needed for allergies.    [name] omeprazole (PRILOSEC) 20 MG capsule Take 20 mg by mouth daily.    . SUMAtriptan (IMITREX) 100 MG tablet Take 1 tablet by mouth at migraine onset. May repeat in 2 hours if headache persists or recurs. Do not exceed 100 mg in 24 hours. 10 tablet 0  . vitamin C (ASCORBIC ACID) 250 MG tablet Take 250 mg by mouth daily.     No current facility-administered medications on file prior to visit.     Wt 205 lb (93 kg)   BMI 41.40 kg/m    Objective:   Physical Exam  Constitutional: He appears well-nourished. He does not have a  sickly appearance. He does not appear ill.  Respiratory: Effort normal. No respiratory distress.  No cough during exam  Neurological: He is alert.  Psychiatric: He has a normal mood and affect.           Assessment & Plan:

## 2019-05-03 NOTE — Patient Instructions (Signed)
Call the main line to schedule a lab only appointment for next week as discussed.  Continue to use your oxygen as needed.  Continue to work on walking and building back up strength.  Please not hesitate to call or message me if you need anything! It was a pleasure to see you today! [name], NP-C

## 2019-05-05 ENCOUNTER — Telehealth: Payer: Self-pay

## 2019-05-05 ENCOUNTER — Other Ambulatory Visit: Admission: RE | Admit: 2019-05-05 | Payer: PPO | Source: Ambulatory Visit

## 2019-05-05 NOTE — Telephone Encounter (Signed)
Spoke with wife, she says patient continues to improve daily.  He is not "quite up to snuff" but is doing a lot better.    He was to be retested today in order to be cleared for a procedure next Wed at the [facility] and when they got there at their scheduled time, everything was closed. She notified the medical center and they will retest him on Monday.  They have no questions or concerns at this time and thank [place] for the ongoing checks.

## 2019-05-08 ENCOUNTER — Other Ambulatory Visit
Admission: RE | Admit: 2019-05-08 | Discharge: 2019-05-08 | Disposition: A | Discharge status: Home / Self Care | Payer: PPO | Source: Ambulatory Visit | Attending: Urology | Admitting: Urology

## 2019-05-08 ENCOUNTER — Other Ambulatory Visit: Payer: Self-pay

## 2019-05-08 DIAGNOSIS — Z01812 Encounter for preprocedural laboratory examination: Secondary | ICD-10-CM | POA: Diagnosis present

## 2019-05-08 DIAGNOSIS — U071 COVID-19: Secondary | ICD-10-CM | POA: Insufficient documentation

## 2019-05-08 LAB — SARS CORONAVIRUS 2 (TAT 6-24 HRS): SARS Coronavirus 2: POSITIVE — AB

## 2019-05-08 NOTE — Telephone Encounter (Signed)
noted 

## 2019-05-09 NOTE — Telephone Encounter (Signed)
Noted  

## 2019-05-10 ENCOUNTER — Ambulatory Visit: Payer: PPO

## 2019-05-10 ENCOUNTER — Telehealth: Payer: Self-pay

## 2019-05-10 ENCOUNTER — Ambulatory Visit: Payer: PPO | Admitting: Radiation Oncology

## 2019-05-10 NOTE — Telephone Encounter (Signed)
Noted and appreciate the follow up. 

## 2019-05-10 NOTE — Telephone Encounter (Signed)
Spoke with patients wife.  He is continuing to improve but slowly.  Patient is becoming frustrated with the ongoing weakness, fatigue and lightheadedness but knows that it will take some time.  She has no acute worries for him at this point and feels that he is making adequate progress.  His breathing is improving on the oxygen without any complications. He is also becoming more independent with his ADL's which is encouraging.   Wife does mention that patient has hx of herpes virus and currently has a mild blister breakout on his buttocks.  They are treating with vaseline and comfort measures and it appears to be improving.  She will call back if it worsens or causes any concerns.  Just wanted PCP to be aware of this breakout.   I will continue routine home monitoring checks on patient for now.   Thanks.

## 2019-05-15 ENCOUNTER — Telehealth: Payer: Self-pay | Admitting: Primary Care

## 2019-05-15 ENCOUNTER — Other Ambulatory Visit (INDEPENDENT_AMBULATORY_CARE_PROVIDER_SITE_OTHER): Payer: PPO

## 2019-05-15 DIAGNOSIS — J1289 Other viral pneumonia: Secondary | ICD-10-CM

## 2019-05-15 DIAGNOSIS — J1282 Pneumonia due to coronavirus disease 2019: Secondary | ICD-10-CM

## 2019-05-15 DIAGNOSIS — U071 COVID-19: Secondary | ICD-10-CM | POA: Diagnosis not present

## 2019-05-15 DIAGNOSIS — E785 Hyperlipidemia, unspecified: Secondary | ICD-10-CM

## 2019-05-15 LAB — COMPREHENSIVE METABOLIC PANEL
ALT: 55 U/L — ABNORMAL HIGH (ref 0–53)
AST: 27 U/L (ref 0–37)
Albumin: 3.9 g/dL (ref 3.5–5.2)
Alkaline Phosphatase: 64 U/L (ref 39–117)
BUN: 11 mg/dL (ref 6–23)
CO2: 30 mEq/L (ref 19–32)
Calcium: 9.4 mg/dL (ref 8.4–10.5)
Chloride: 103 mEq/L (ref 96–112)
Creatinine, Ser: 0.98 mg/dL (ref 0.40–1.50)
GFR: 76.58 mL/min (ref >60.00)
Glucose, Bld: 104 mg/dL — ABNORMAL HIGH (ref 70–99)
Potassium: 4.4 mEq/L (ref 3.5–5.1)
Sodium: 141 mEq/L (ref 135–145)
Total Bilirubin: 0.4 mg/dL (ref 0.2–1.2)
Total Protein: 7.3 g/dL (ref 6.0–8.3)

## 2019-05-15 LAB — CBC WITH DIFFERENTIAL/PLATELET
Basophils Absolute: 0.1 10*3/uL (ref 0.0–0.1)
Basophils Relative: 0.7 % (ref 0.0–3.0)
Eosinophils Absolute: 0.7 10*3/uL (ref 0.0–0.7)
Eosinophils Relative: 7.9 % — ABNORMAL HIGH (ref 0.0–5.0)
HCT: 40.7 % (ref 39.0–52.0)
Hemoglobin: 13.6 g/dL (ref 13.0–17.0)
Lymphocytes Relative: 14.5 % (ref 12.0–46.0)
Lymphs Abs: 1.2 10*3/uL (ref 0.7–4.0)
MCHC: 33.4 g/dL (ref 30.0–36.0)
MCV: 92.5 fl (ref 78.0–100.0)
Monocytes Absolute: 1 10*3/uL (ref 0.1–1.0)
Monocytes Relative: 11.5 % (ref 3.0–12.0)
Neutro Abs: 5.5 10*3/uL (ref 1.4–7.7)
Neutrophils Relative %: 65.4 % (ref 43.0–77.0)
Platelets: 327 10*3/uL (ref 150.0–400.0)
RBC: 4.4 Mil/uL (ref 4.22–5.81)
RDW: 14.7 % (ref 11.5–15.5)
WBC: 8.4 10*3/uL (ref 4.0–10.5)

## 2019-05-15 LAB — LIPID PANEL
Cholesterol: 228 mg/dL — ABNORMAL HIGH (ref 0–200)
HDL: 28.8 mg/dL — ABNORMAL LOW (ref >39.00)
NonHDL: 199.58
Total CHOL/HDL Ratio: 8
Triglycerides: 290 mg/dL — ABNORMAL HIGH (ref 0.0–149.0)
VLDL: 58 mg/dL — ABNORMAL HIGH (ref 0.0–40.0)

## 2019-05-15 LAB — LDL CHOLESTEROL, DIRECT: Direct LDL: 167 mg/dL

## 2019-05-15 NOTE — Telephone Encounter (Signed)
Patient was in the office for labs. Patient wanted to let [name] know that patient still have some cough and congestion due to Covid.

## 2019-05-15 NOTE — Telephone Encounter (Signed)
Noted. He has been followed by our nurse for routine Covid calls/follow up.

## 2019-05-16 MED ORDER — ATORVASTATIN CALCIUM 20 MG PO TABS: 20.0000 mg | ORAL_TABLET | Freq: Every day | ORAL | 3 refills | Status: DC

## 2019-05-17 ENCOUNTER — Encounter: Payer: Self-pay | Admitting: Emergency Medicine

## 2019-05-17 ENCOUNTER — Other Ambulatory Visit: Payer: Self-pay

## 2019-05-17 ENCOUNTER — Emergency Department
Admission: EM | Admit: 2019-05-17 | Discharge: 2019-05-17 | Disposition: A | Discharge status: Home / Self Care | Payer: PPO | Attending: Emergency Medicine | Admitting: Emergency Medicine

## 2019-05-17 ENCOUNTER — Emergency Department: Payer: PPO

## 2019-05-17 ENCOUNTER — Telehealth: Payer: Self-pay

## 2019-05-17 DIAGNOSIS — E785 Hyperlipidemia, unspecified: Secondary | ICD-10-CM | POA: Insufficient documentation

## 2019-05-17 DIAGNOSIS — R0602 Shortness of breath: Secondary | ICD-10-CM

## 2019-05-17 DIAGNOSIS — U071 COVID-19: Secondary | ICD-10-CM | POA: Insufficient documentation

## 2019-05-17 DIAGNOSIS — Z87891 Personal history of nicotine dependence: Secondary | ICD-10-CM | POA: Insufficient documentation

## 2019-05-17 DIAGNOSIS — R918 Other nonspecific abnormal finding of lung field: Secondary | ICD-10-CM | POA: Diagnosis not present

## 2019-05-17 DIAGNOSIS — R0902 Hypoxemia: Secondary | ICD-10-CM | POA: Diagnosis not present

## 2019-05-17 DIAGNOSIS — Z9981 Dependence on supplemental oxygen: Secondary | ICD-10-CM | POA: Diagnosis not present

## 2019-05-17 DIAGNOSIS — R0600 Dyspnea, unspecified: Secondary | ICD-10-CM

## 2019-05-17 LAB — BASIC METABOLIC PANEL
Anion gap: 10 (ref 5–15)
BUN: 13 mg/dL (ref 8–23)
CO2: 23 mmol/L (ref 22–32)
Calcium: 9 mg/dL (ref 8.9–10.3)
Chloride: 106 mmol/L (ref 98–111)
Creatinine, Ser: 0.83 mg/dL (ref 0.61–1.24)
GFR calc Af Amer: >60 mL/min (ref >60)
GFR calc non Af Amer: >60 mL/min (ref >60)
Glucose, Bld: 107 mg/dL — ABNORMAL HIGH (ref 70–99)
Potassium: 4.7 mmol/L (ref 3.5–5.1)
Sodium: 139 mmol/L (ref 135–145)

## 2019-05-17 LAB — CBC WITH DIFFERENTIAL/PLATELET
Abs Immature Granulocytes: 0.25 10*3/uL — ABNORMAL HIGH (ref 0.00–0.07)
Basophils Absolute: 0.1 10*3/uL (ref 0.0–0.1)
Basophils Relative: 1 %
Eosinophils Absolute: 0.6 10*3/uL — ABNORMAL HIGH (ref 0.0–0.5)
Eosinophils Relative: 8 %
HCT: 42.2 % (ref 39.0–52.0)
Hemoglobin: 13.9 g/dL (ref 13.0–17.0)
Immature Granulocytes: 3 %
Lymphocytes Relative: 12 %
Lymphs Abs: 1 10*3/uL (ref 0.7–4.0)
MCH: 30.5 pg (ref 26.0–34.0)
MCHC: 32.9 g/dL (ref 30.0–36.0)
MCV: 92.7 fL (ref 80.0–100.0)
Monocytes Absolute: 1.3 10*3/uL — ABNORMAL HIGH (ref 0.1–1.0)
Monocytes Relative: 15 %
Neutro Abs: 5.3 10*3/uL (ref 1.7–7.7)
Neutrophils Relative %: 61 %
Platelets: 427 10*3/uL — ABNORMAL HIGH (ref 150–400)
RBC: 4.55 MIL/uL (ref 4.22–5.81)
RDW: 14.2 % (ref 11.5–15.5)
WBC: 8.5 10*3/uL (ref 4.0–10.5)
nRBC: 0 % (ref 0.0–0.2)

## 2019-05-17 LAB — BRAIN NATRIURETIC PEPTIDE: B Natriuretic Peptide: 41 pg/mL (ref 0.0–100.0)

## 2019-05-17 MED ORDER — LEVOFLOXACIN 750 MG PO TABS: 750.0000 mg | ORAL_TABLET | Freq: Every day | ORAL | 0 refills | Status: DC

## 2019-05-17 MED ORDER — PREDNISONE 10 MG (21) PO TBPK: ORAL_TABLET | Freq: Every day | ORAL | 0 refills | Status: DC

## 2019-05-17 MED ORDER — IOHEXOL 350 MG/ML SOLN
75.0000 mL | Freq: Once | INTRAVENOUS | Status: AC | PRN
  Administered 2019-05-17: 75 mL via INTRAVENOUS

## 2019-05-17 MED ORDER — AZITHROMYCIN 500 MG PO TABS
500.0000 mg | ORAL_TABLET | Freq: Once | ORAL | Status: AC
  Administered 2019-05-17: 500 mg via ORAL
  Filled 2019-05-17: qty 1

## 2019-05-17 MED ORDER — PREDNISONE 20 MG PO TABS
60.0000 mg | ORAL_TABLET | Freq: Once | ORAL | Status: AC
  Administered 2019-05-17: 60 mg via ORAL
  Filled 2019-05-17: qty 3

## 2019-05-17 NOTE — ED Provider Notes (Signed)
[facility] Emergency Department Provider Note       Time seen: ----------------------------------------- 8:38 AM on 05/17/2019 -----------------------------------------   I have reviewed the triage vital signs and the nursing notes.  HISTORY   Chief Complaint Shortness of Breath and Fever    HPI Preston Thomas is a 68 y.o. male with a history of COVID-19, acute respiratory failure with hypoxia, prostate cancer, hyperlipidemia, migraines who presents to the ED for worsening shortness of breath and fever that he is noticed over the past 2 to 3 days.  Patient was improving after having COVID about a month ago, still wears home oxygen on occasion.  Again he feels like this is worsened despite the fact that he was improving for several weeks.  He was concerned he may have pneumonia.  Past Medical History:  Diagnosis Date  . COLONIC POLYPS, HX OF 08/31/2007   Qualifier: Diagnosis of  By: Silvio Pate MD, Baird Cancer   . DIVERTICULITIS, HX OF 08/31/2007   Qualifier: Diagnosis of  By: Joesph Fillers CMA (AAMA), Ronny Bacon    . DIVERTICULOSIS, COLON 08/31/2007   Qualifier: Diagnosis of  By: Silvio Pate MD, Baird Cancer   . GERD (gastroesophageal reflux disease)   . RENAL CALCULUS, HX OF 08/31/2007   Qualifier: Diagnosis of  By: Joesph Fillers CMA (AAMA), Natasha      Patient Active Problem List   Diagnosis Date Noted  . Pneumonia due to COVID-19 virus 04/19/2019  . Acute respiratory failure with hypoxia ([place]) 04/19/2019  . Prostate cancer ([place]) 04/11/2019  . Hx of radiation therapy 04/11/2019  . GERD (gastroesophageal reflux disease) 09/13/2018  . Preventative health care 08/17/2018  . Hyperlipidemia 08/31/2007  . Migraines 08/31/2007    Past Surgical History:  Procedure Laterality Date  . APPENDECTOMY  1987  . CHOLECYSTECTOMY  1987    Allergies Penicillins  Social History Social History   Tobacco Use  . Smoking status: Former Smoker    Years: 10.00    Types:  Cigarettes    Quit date: 09/28/1984    Years since quitting: 34.6  . Smokeless tobacco: Never Used  Substance Use Topics  . Alcohol use: Not Currently  . Drug use: Never    Review of Systems Constitutional: Positive for fever Cardiovascular: Negative for chest pain. Respiratory: Positive for shortness of breath Gastrointestinal: Negative for abdominal pain, vomiting and diarrhea. Musculoskeletal: Negative for back pain. Skin: Negative for rash. Neurological: Negative for headaches, focal weakness or numbness.  All systems negative/normal/unremarkable except as stated in the HPI  ____________________________________________   PHYSICAL EXAM:  VITAL SIGNS: ED Triage Vitals  Enc Vitals Group     BP      Pulse      Resp      Temp      Temp src      SpO2      Weight      Height      Head Circumference      Peak Flow      Pain Score      Pain Loc      Pain Edu?      Excl. in [place]?    Constitutional: Alert and oriented. Well appearing and in no distress. Eyes: Conjunctivae are normal. Normal extraocular movements. ENT      Head: Normocephalic and atraumatic.      Nose: No congestion/rhinnorhea.      Mouth/Throat: Mucous membranes are moist.      Neck: No stridor. Cardiovascular: Normal rate, regular rhythm.  No murmurs, rubs, or gallops. Respiratory: Normal respiratory effort without tachypnea nor retractions. Breath sounds are clear and equal bilaterally. No wheezes/rales/rhonchi. Gastrointestinal: Soft and nontender. Normal bowel sounds Musculoskeletal: Nontender with normal range of motion in extremities. No lower extremity tenderness nor edema. Neurologic:  Normal speech and language. No gross focal neurologic deficits are appreciated.  Skin:  Skin is warm, dry and intact. No rash noted. Psychiatric: Mood and affect are normal. Speech and behavior are normal.  ____________________________________________  EKG: Interpreted by me.  Sinus rhythm with rate of 94 bpm,  normal PR interval, normal QRS, normal QT  ____________________________________________  ED COURSE:  As part of my medical decision making, I reviewed the following data within the [place] History obtained from family if available, nursing notes, old chart and ekg, as well as notes from prior ED visits. Patient presented for dyspnea and fever, we will assess with labs and imaging as indicated at this time.   Procedures  Preston Thomas was evaluated in Emergency Department on 05/17/2019 for the symptoms described in the history of present illness. He was evaluated in the context of the global COVID-19 pandemic, which necessitated consideration that the patient might be at risk for infection with the SARS-CoV-2 virus that causes COVID-19. Institutional protocols and algorithms that pertain to the evaluation of patients at risk for COVID-19 are in a state of rapid change based on information released by regulatory bodies including the CDC and federal and state organizations. These policies and algorithms were followed during the patient's care in the ED.  ____________________________________________   LABS (pertinent positives/negatives)  Labs Reviewed  CBC WITH DIFFERENTIAL/PLATELET - Abnormal; Notable for the following components:      Result Value   Platelets 427 (*)    Monocytes Absolute 1.3 (*)    Eosinophils Absolute 0.6 (*)    Abs Immature Granulocytes 0.25 (*)    All other components within normal limits  BASIC METABOLIC PANEL - Abnormal; Notable for the following components:   Glucose, Bld 107 (*)    All other components within normal limits  BRAIN NATRIURETIC PEPTIDE  TROPONIN I (HIGH SENSITIVITY)    RADIOLOGY Images were viewed by me  Chest x-ray  IMPRESSION: Increase in patchy infiltrates bilaterally but predominately in the left lung consistent with the given clinical history of COVID-19 positivity. IMPRESSION: 1. No central,  segmental, or subsegmental pulmonary embolism. 2. Patchy peripheral airspace opacities throughout both lungs. There are a spectrum of findings in the lungs which can be seen with acute atypical infection (as well as other non-infectious etiologies). In particular, viral pneumonia (including COVID-19) should be considered in the appropriate clinical setting. ____________________________________________   DIFFERENTIAL DIAGNOSIS   Pneumonia, COVID-19, hypoxia, PE, pneumothorax  FINAL ASSESSMENT AND PLAN  Dyspnea, COVID-19   Plan: The patient had presented for worsening dyspnea and fever. Patient's labs did not reveal any acute process. Patient's imaging revealed increased patchy infiltrates bilaterally consistent with COVID-19.  It is unclear whether or not this represents worsening or persistence of his prior x-rays as there are none recently to compare to.  We did scan him for PEs which was negative for PE but certainly positive for these airspace opacities which again are not unexpected.  We will try antibiotics and a steroid taper for him.  He is on home oxygen.  I will advise close outpatient follow-up.   Laurence Aly, MD    Note: This note was generated in part or whole with voice  recognition software. Voice recognition is usually quite accurate but there are transcription errors that can and very often do occur. I apologize for any typographical errors that were not detected and corrected.     Earleen Newport, MD 05/17/19 [id number]

## 2019-05-17 NOTE — ED Notes (Signed)
Radiology to bedside. 

## 2019-05-17 NOTE — ED Notes (Signed)
Pt placed on 2L nasal cannula.

## 2019-05-17 NOTE — Telephone Encounter (Signed)
Pt has been scheduled for appt on 05/30/2019. Pt is aware of need for CXR prior to visit. Nothing further is needed at this time.

## 2019-05-17 NOTE — ED Notes (Signed)
Patient transported to CT 

## 2019-05-17 NOTE — ED Notes (Signed)
EDP to bedside; update provided. 

## 2019-05-17 NOTE — ED Triage Notes (Signed)
Pt in via POV, reports recent Covid admission to [facility]; has been home approximately 3 weeks, feeling better and then feeling worse again this past week, noticing increased shortness of breath and use of PRN oxygen, woke up this morning with fever 100.7, has taken Tylenol prior to arrival. NAD noted at this time.

## 2019-05-17 NOTE — Telephone Encounter (Signed)
-----   Message from Wilhelmina Mcardle, MD sent at 05/17/2019 12:54 PM EDT ----- Please schedule for new consult with me 09/01 for 30 mins with CXR day of or day prior. ED referral from Dr Lenise Arena  Thanks  Waunita Schooner  PS. he is in ED right now

## 2019-05-18 LAB — TROPONIN I (HIGH SENSITIVITY): Troponin I (High Sensitivity): 6 ng/L (ref <18)

## 2019-05-19 ENCOUNTER — Telehealth: Payer: Self-pay

## 2019-05-19 ENCOUNTER — Ambulatory Visit: Payer: PPO | Admitting: Primary Care

## 2019-05-19 NOTE — Telephone Encounter (Signed)
Called to check on status of patient.  He did go in to the ER a couple days ago and was discharged with antibiotics and steroids.  They were able to rule out any signs of a blood clot.    Wife states that with the antibiotics and steroids his condition is improving and his breathing is better.    They have no questions or concerns at this point.  It does appear patient has been scheduled for a follow up with PCP next Friday afternoon.   I will continue to make home monitoring calls on patient to track his progress.   Wife and patient are very thankful to [employer] [facility] [employer] for staying on top of his condition.   FYI to PCP.

## 2019-05-21 NOTE — Telephone Encounter (Signed)
Noted and appreciate the follow up. Glad to know he's doing better.

## 2019-05-26 ENCOUNTER — Ambulatory Visit (INDEPENDENT_AMBULATORY_CARE_PROVIDER_SITE_OTHER): Payer: PPO | Admitting: Primary Care

## 2019-05-26 ENCOUNTER — Encounter: Payer: Self-pay | Admitting: Primary Care

## 2019-05-26 DIAGNOSIS — J1289 Other viral pneumonia: Secondary | ICD-10-CM

## 2019-05-26 DIAGNOSIS — U071 COVID-19: Secondary | ICD-10-CM

## 2019-05-26 DIAGNOSIS — J1282 Pneumonia due to coronavirus disease 2019: Secondary | ICD-10-CM

## 2019-05-26 NOTE — Patient Instructions (Signed)
Follow up with pulmonology as scheduled.   Continue wearing oxygen as needed.   Continue pushing yourself as tolerated.  It was nice to see you! [name], NP-C

## 2019-05-26 NOTE — Assessment & Plan Note (Signed)
Evaluated at [facility] ED on 05/17/19 for new fevers and decrease in oxygen saturation. Diagnosed with pneumonia and treated. Doing well post treatment, completed courses of Levaquin and prednisone. Appears much better compared to our last video visit. Speaking in complete sentences via phone, no distress. Continue with PRN oxygen, continue to work on endurance.  Follow up with pulmonology as scheduled.

## 2019-05-26 NOTE — Progress Notes (Signed)
Subjective:    Patient ID: Preston Thomas, male    DOB: Nov 22, 1950, 68 y.o.   MRN: [id number]  HPI  Virtual Visit via Video Note  I connected with Preston Thomas on 05/26/19 at  3:20 PM EDT by a video enabled telemedicine application and verified that I am speaking with the correct person using two identifiers.  Location: Patient: Home Provider: Office   I discussed the limitations of evaluation and management by telemedicine and the availability of in person appointments. The patient expressed understanding and agreed to proceed. We attempted a video visit but the sound wouldn't work. We had to proceed with telephone visit for the majority of our visit which lasted 20 min and 20 sec.  History of Present Illness:  [name] is a 68 year old male with a history of Covid-19, pneumonia due to Covid-19, prostate cancer, acute respiratory failure who presents today for emergency department follow up.  He sent a message via my chart on 05/17/19 with reports of fevers, cough, and oxygen saturation at 87-88%. He endorsed feeling well overall but these symptoms were new. Given that we aren't seeing any Covid patients in the clinic he was encouraged to go to the ED for further evaluation.   He presented to [facility] ED with the same symptoms. Underwent chest xray which showed "patchy infiltrates bilaterally" so he underwent CT angio chest which revealed pneumonia and remnants of Covid-19. He underwent blood work which was grossly negative. Given the xray and CT findings he was treated with Levaquin 750 mg and prednisone and discharged home.    Since discharge home and is doing better. His resting oxygen saturation is 94-95% without oxygen. He continues to feel the "brain fog" and finding words at times, mostly in the morning. He is walking around his home and continues to feel some shortness of breath, oxygen saturation of high 80's on room air which will increase to mid 90's  within a few minutes after rest.   His overall strength has improved in terms of overall stamina and energy levels. Also feels stronger in his legs. Less discomfort with deep inspiration and is able to use his incentive spirometer without difficulty. He is using his oxygen on as needed and for brief periods of 10-15 minutes at at time. Saturation will improve to mid 90's after oxygen within minutes. Examples of oxygen use include after taking a shower, walking distances of 100 yards or more, etc. He has been afebrile since August 19th. He has an appointment scheduled with pulmonology for next week.    Observations/Objective:  Alert and oriented. Appears well, not sickly. No distress. Speaking in complete sentences.   Assessment and Plan:  Evaluated at [facility] ED on 05/17/19 for new fevers and decrease in oxygen saturation. Diagnosed with pneumonia and treated. Doing well post treatment, completed courses of Levaquin and prednisone. Appears much better compared to our last video visit. Speaking in complete sentences via phone, no distress. Continue with PRN oxygen, continue to work on endurance.  Follow up with pulmonology as scheduled.  Follow Up Instructions:  Follow up with pulmonology as scheduled.   Continue wearing oxygen as needed.   Continue pushing yourself as tolerated.  It was nice to see you! [name], NP-C    I discussed the assessment and treatment plan with the patient. The patient was provided an opportunity to ask questions and all were answered. The patient agreed with the plan and demonstrated an understanding of  the instructions.   The patient was advised to call back or seek an in-person evaluation if the symptoms worsen or if the condition fails to improve as anticipated.    Pleas [name], NP    Review of Systems  Constitutional: Negative for fatigue and fever.  HENT: Positive for rhinorrhea. Negative for congestion.   Respiratory: Positive for  shortness of breath. Negative for cough.   Cardiovascular: Negative for chest pain.       Past Medical History:  Diagnosis Date  . COLONIC POLYPS, HX OF 08/31/2007   Qualifier: Diagnosis of  By: Silvio Pate MD, Baird Cancer   . DIVERTICULITIS, HX OF 08/31/2007   Qualifier: Diagnosis of  By: Joesph Fillers CMA (AAMA), Ronny Bacon    . DIVERTICULOSIS, COLON 08/31/2007   Qualifier: Diagnosis of  By: Silvio Pate MD, Baird Cancer   . GERD (gastroesophageal reflux disease)   . RENAL CALCULUS, HX OF 08/31/2007   Qualifier: Diagnosis of  By: Joesph Fillers CMA (AAMA), Natasha       Social History   Socioeconomic History  . Marital status: Married    Spouse name: Not on file  . Number of children: Not on file  . Years of education: Not on file  . Highest education level: Not on file  Occupational History  . Not on file  Social Needs  . Financial resource strain: Not on file  . Food insecurity    Worry: Not on file    Inability: Not on file  . Transportation needs    Medical: Not on file    Non-medical: Not on file  Tobacco Use  . Smoking status: Former Smoker    Years: 10.00    Types: Cigarettes    Quit date: 09/28/1984    Years since quitting: 34.6  . Smokeless tobacco: Never Used  Substance and Sexual Activity  . Alcohol use: Not Currently  . Drug use: Never  . Sexual activity: Yes  Lifestyle  . Physical activity    Days per week: Not on file    Minutes per session: Not on file  . Stress: Not on file  Relationships  . Social Herbalist on phone: Not on file    Gets together: Not on file    Attends religious service: Not on file    Active member of club or organization: Not on file    Attends meetings of clubs or organizations: Not on file    Relationship status: Not on file  . Intimate partner violence    Fear of current or ex partner: Not on file    Emotionally abused: Not on file    Physically abused: Not on file    Forced sexual activity: Not on file  Other Topics Concern  . Not  on file  Social History Narrative  . Not on file    Past Surgical History:  Procedure Laterality Date  . APPENDECTOMY  1987  . CHOLECYSTECTOMY  1987    Family History  Problem Relation Age of Onset  . Early death Father   . Kidney disease Father     Allergies  Allergen Reactions  . Penicillins     Passed out Did it involve swelling of the face/tongue/throat, SOB, or low BP? Yes Did it involve sudden or severe rash/hives, skin peeling, or any reaction on the inside of your mouth or nose? No Did you need to seek medical attention at a hospital or doctor's office? No When did it last happen?30 + years If  all above answers are "NO", may proceed with cephalosporin use.     Current Outpatient Medications on File Prior to Visit  Medication Sig Dispense Refill  . acetaminophen (TYLENOL) 500 MG tablet Take 1,000 mg by mouth every 8 (eight) hours as needed for moderate pain.    [name] atorvastatin (LIPITOR) 20 MG tablet Take 1 tablet (20 mg total) by mouth daily. For cholesterol. 90 tablet 3  . Cholecalciferol (DIALYVITE VITAMIN D 5000) 125 MCG (5000 UT) capsule Take 5,000 Units by mouth daily.    [name] levofloxacin (LEVAQUIN) 750 MG tablet Take 1 tablet (750 mg total) by mouth daily. 5 tablet 0  . loratadine (CLARITIN) 10 MG tablet Take 10 mg by mouth daily as needed for allergies.    [name] omeprazole (PRILOSEC) 20 MG capsule Take 20 mg by mouth daily.    . predniSONE (STERAPRED UNI-PAK 21 TAB) 10 MG (21) TBPK tablet Take by mouth daily. Dispense steroid taper pack as directed 21 tablet 0  . SUMAtriptan (IMITREX) 100 MG tablet Take 1 tablet by mouth at migraine onset. May repeat in 2 hours if headache persists or recurs. Do not exceed 100 mg in 24 hours. 10 tablet 0  . vitamin C (ASCORBIC ACID) 250 MG tablet Take 250 mg by mouth daily.     No current facility-administered medications on file prior to visit.     Temp 97.7 F (36.5 C) (Temporal)   Wt 205 lb (93 kg)   BMI 40.04 kg/m     Objective:   Physical Exam  Constitutional: He is oriented to person, place, and time. He appears well-nourished.  Respiratory: Effort normal. No respiratory distress. He has no wheezes.  Neurological: He is alert and oriented to person, place, and time.  Psychiatric: He has a normal mood and affect.           Assessment & Plan:

## 2019-05-27 DIAGNOSIS — U071 COVID-19: Secondary | ICD-10-CM | POA: Diagnosis not present

## 2019-05-30 ENCOUNTER — Ambulatory Visit
Admission: RE | Admit: 2019-05-30 | Discharge: 2019-05-30 | Disposition: A | Discharge status: Home / Self Care | Payer: PPO | Source: Ambulatory Visit | Attending: Pulmonary Disease | Admitting: Pulmonary Disease

## 2019-05-30 ENCOUNTER — Other Ambulatory Visit: Payer: Self-pay

## 2019-05-30 ENCOUNTER — Encounter: Payer: Self-pay | Admitting: Pulmonary Disease

## 2019-05-30 ENCOUNTER — Ambulatory Visit: Payer: PPO | Admitting: Pulmonary Disease

## 2019-05-30 VITALS — BP 126/70 | HR 89 | Temp 97.2°F | Ht 60.0 in | Wt 195.6 lb

## 2019-05-30 DIAGNOSIS — U071 COVID-19: Secondary | ICD-10-CM

## 2019-05-30 DIAGNOSIS — J1282 Pneumonia due to coronavirus disease 2019: Secondary | ICD-10-CM

## 2019-05-30 DIAGNOSIS — J1289 Other viral pneumonia: Secondary | ICD-10-CM | POA: Insufficient documentation

## 2019-05-30 DIAGNOSIS — R5383 Other fatigue: Secondary | ICD-10-CM

## 2019-05-30 DIAGNOSIS — R0609 Other forms of dyspnea: Secondary | ICD-10-CM

## 2019-05-30 DIAGNOSIS — R918 Other nonspecific abnormal finding of lung field: Secondary | ICD-10-CM | POA: Diagnosis not present

## 2019-05-30 DIAGNOSIS — R0683 Snoring: Secondary | ICD-10-CM

## 2019-05-30 DIAGNOSIS — G4719 Other hypersomnia: Secondary | ICD-10-CM | POA: Diagnosis not present

## 2019-05-30 DIAGNOSIS — R0681 Apnea, not elsewhere classified: Secondary | ICD-10-CM | POA: Diagnosis not present

## 2019-05-30 NOTE — Progress Notes (Signed)
PULMONARY CONSULT NOTE  Requesting MD/Service: [facility] ED Date of initial consultation: 05/30/19 Reason for consultation: Recent COVID19 infection. Persistent dyspnea, hypoxemia  PT PROFILE: 68 y.o. male former remote smoker hospitalized 7/23-7/29/20 with COVID-19 respiratory infection.  He was never intubated.  He was treated with remdesivir initially which was stopped due to elevation of LFTs.  He was treated with steroids. Seen in [facility] ED 08/19 with persistent fever and dyspnea. CTA chest documented below. Referred to Pulmonary clinic for further eval and management  DATA: 05/17/19 CTA chest: No central, segmental, or subsegmental pulmonary embolism. Patchy peripheral airspace opacities throughout both lungs  INTERVAL:  HPI:  A please see summary above.  HE was hospitalized at [facility] with COVID-19 respiratory infection in July.  He did not require intubation.  He was discharged home with oxygen which he is using now only with exertion.  He continues to describe profound fatigue and exertional dyspnea.  He is able to ambulate approximately 1/4 mile before he is out of breath and out of energy.  His chief complaint is "brain fog".  He is not driving motor vehicle due to this.  He feels that he does not have the cognitive capacity to drive safely.  In general, he does feel that he is improving gradually since his very severe illness and hospitalization.  Of note, he has very poor sleep quality and awakens without feeling refreshed.  He has profound daytime hypersomnolence.  He has a long history of snoring and his wife has witnessed him having apneas during sleep.  This has been noted prior to his recent COVID-19 infection and continues without change since his hospitalization..   Past Medical History:  Diagnosis Date  . COLONIC POLYPS, HX OF 08/31/2007   Qualifier: Diagnosis of  By: Silvio Pate MD, [facility]   . DIVERTICULITIS, HX OF 08/31/2007   Qualifier: Diagnosis of  By: Joesph Fillers  CMA (AAMA), Ronny Bacon    . DIVERTICULOSIS, COLON 08/31/2007   Qualifier: Diagnosis of  By: Silvio Pate MD, [facility]   . GERD (gastroesophageal reflux disease)   . RENAL CALCULUS, HX OF 08/31/2007   Qualifier: Diagnosis of  By: Joesph Fillers CMA (AAMA), Natasha      Patient Active Problem List   Diagnosis Date Noted  . Pneumonia due to COVID-19 virus 04/19/2019  . Prostate cancer ([place]) 04/11/2019  . Hx of radiation therapy 04/11/2019  . GERD (gastroesophageal reflux disease) 09/13/2018  . Preventative health care 08/17/2018  . Hyperlipidemia 08/31/2007  . Migraines 08/31/2007     Past Surgical History:  Procedure Laterality Date  . APPENDECTOMY  1987  . CHOLECYSTECTOMY  1987    MEDICATIONS: I have reviewed all medications and confirmed regimen as documented  Social History   Socioeconomic History  . Marital status: Married    Spouse name: Not on file  . Number of children: Not on file  . Years of education: Not on file  . Highest education level: Not on file  Occupational History  . Not on file  Social Needs  . Financial resource strain: Not on file  . Food insecurity    Worry: Not on file    Inability: Not on file  . Transportation needs    Medical: Not on file    Non-medical: Not on file  Tobacco Use  . Smoking status: Former Smoker    Years: 10.00    Types: Cigarettes    Quit date: 09/28/1984    Years since quitting: 34.6  . Smokeless tobacco: Never  Used  Substance and Sexual Activity  . Alcohol use: Not Currently  . Drug use: Never  . Sexual activity: Yes  Lifestyle  . Physical activity    Days per week: Not on file    Minutes per session: Not on file  . Stress: Not on file  Relationships  . Social Herbalist on phone: Not on file    Gets together: Not on file    Attends religious service: Not on file    Active member of club or organization: Not on file    Attends meetings of clubs or organizations: Not on file    Relationship status: Not on file   . Intimate partner violence    Fear of current or ex partner: Not on file    Emotionally abused: Not on file    Physically abused: Not on file    Forced sexual activity: Not on file  Other Topics Concern  . Not on file  Social History Narrative  . Not on file    Family History  Problem Relation Age of Onset  . Early death Father   . Kidney disease Father     ROS: No fever, myalgias/arthralgias, unexplained weight loss or weight gain No new focal weakness or sensory deficits No otalgia, hearing loss, visual changes, nasal and sinus symptoms, mouth and throat problems No neck pain or adenopathy No abdominal pain, N/V/D, diarrhea, change in bowel pattern No dysuria, change in urinary pattern   Vitals:   05/30/19 0924  BP: 126/70  Pulse: 89  Temp: (!) 97.2 F (36.2 C)  TempSrc: Temporal  SpO2: 97%  Weight: 195 lb 9.6 oz (88.7 kg)  Height: 5' (1.524 m)  Room air  EXAM:  Gen: Obese, No overt respiratory distress HEENT: NCAT, sclera white, oropharynx normal Neck: Thick neck which is supple without LAN, thyromegaly, JVD Lungs: breath sounds full without wheezes or other adventitious sounds Cardiovascular: RRR, no murmurs noted Abdomen: Centripetal obesity, soft, nontender, normal BS Ext: without clubbing, cyanosis, edema Neuro: CNs grossly intact, motor and sensory intact Skin: Limited exam, no lesions noted  DATA:   BMP Latest Ref Rng & Units 05/17/2019 05/15/2019 04/26/2019  Glucose 70 - 99 mg/dL 107(H) 104(H) 135(H)  BUN 8 - 23 mg/dL 13 11 23   Creatinine 0.61 - 1.24 mg/dL 0.83 0.98 0.82  Sodium 135 - 145 mmol/L 139 141 140  Potassium 3.5 - 5.1 mmol/L 4.7 4.4 4.1  Chloride 98 - 111 mmol/L 106 103 103  CO2 22 - 32 mmol/L 23 30 25   Calcium 8.9 - 10.3 mg/dL 9.0 9.4 8.2(L)    CBC Latest Ref Rng & Units 05/17/2019 05/15/2019 04/26/2019  WBC 4.0 - 10.5 K/uL 8.5 8.4 12.6(H)  Hemoglobin 13.0 - 17.0 g/dL 13.9 13.6 13.8  Hematocrit 39.0 - 52.0 % 42.2 40.7 43.1  Platelets  150 - 400 K/uL 427(H) 327.0 509(H)    CXR 05/17/19: Patchy bilateral infiltrates CXR 05/30/19: Persistent but improved patchy bilateral infiltrates  I have personally reviewed all chest radiographs reported above including CXRs and CT chest unless otherwise indicated  IMPRESSION:     ICD-10-CM   1. Recent pneumonia due to COVID-19 virus  U07.1 DG Chest 2 View   J12.89   2. DOE (dyspnea on exertion)  R06.09   3. Fatigue, unspecified type  R53.83 Home sleep test  4. Daytime hypersomnolence  G47.19   5. Witnessed episode of apnea  R06.81   6. Snoring  R06.83    He  seems to be gradually improving from recent COVID-19 infection.  Symptoms of fatigue, daytime hypersomnolence with snoring and witnessed apneas during sleep strongly suggest obstructive sleep apnea.  PLAN:  Chest x-ray ordered for this visit and reviewed above.  I have contacted him regarding findings Home sleep study has been ordered He is instructed to continue to progress his activity level as tolerated He is instructed to continue oxygen therapy as needed with exertion Follow-up in 6-8 weeks with Dr. Ashby Dawes after sleep study results available Chest x-rays should be followed until resolution of infiltrates or new baseline is established Once, pulmonary function tests should be considered    Merton Border, MD PCCM service Mobile [phone] Pager [phone] 06/02/2019 10:18 AM

## 2019-05-30 NOTE — Patient Instructions (Signed)
Chest x-ray today.  We will contact you with any new or important findings  Home sleep study ordered  Continue to work on progressing your activity level  Follow-up in 6-8 weeks with Dr. Ashby Dawes  At some point in the near future, pulmonary function tests should be performed

## 2019-06-02 ENCOUNTER — Other Ambulatory Visit: Admission: RE | Admit: 2019-06-02 | Payer: PPO | Source: Ambulatory Visit

## 2019-06-02 ENCOUNTER — Encounter: Payer: Self-pay | Admitting: Pulmonary Disease

## 2019-06-06 ENCOUNTER — Ambulatory Visit: Payer: PPO | Admitting: Radiation Oncology

## 2019-06-06 MED ORDER — CIPROFLOXACIN IN D5W 400 MG/200ML IV SOLN: 400.0000 mg | INTRAVENOUS | Status: DC

## 2019-06-07 ENCOUNTER — Encounter: Admission: RE | Payer: Self-pay | Source: Home / Self Care

## 2019-06-07 ENCOUNTER — Other Ambulatory Visit: Payer: Self-pay

## 2019-06-07 ENCOUNTER — Ambulatory Visit: Payer: PPO | Admitting: Radiation Oncology

## 2019-06-07 ENCOUNTER — Ambulatory Visit: Admission: RE | Admit: 2019-06-07 | Payer: PPO | Source: Home / Self Care | Admitting: Urology

## 2019-06-07 ENCOUNTER — Ambulatory Visit
Admission: RE | Admit: 2019-06-07 | Discharge: 2019-06-07 | Disposition: A | Discharge status: Home / Self Care | Payer: PPO | Source: Ambulatory Visit | Attending: Radiation Oncology | Admitting: Radiation Oncology

## 2019-06-07 ENCOUNTER — Ambulatory Visit: Payer: PPO

## 2019-06-07 VITALS — BP 148/95 | HR 91 | Temp 96.5°F | Resp 18

## 2019-06-07 DIAGNOSIS — Z79899 Other long term (current) drug therapy: Secondary | ICD-10-CM | POA: Insufficient documentation

## 2019-06-07 DIAGNOSIS — C61 Malignant neoplasm of prostate: Secondary | ICD-10-CM | POA: Insufficient documentation

## 2019-06-07 DIAGNOSIS — K219 Gastro-esophageal reflux disease without esophagitis: Secondary | ICD-10-CM | POA: Diagnosis not present

## 2019-06-07 DIAGNOSIS — Z86010 Personal history of colonic polyps: Secondary | ICD-10-CM | POA: Diagnosis not present

## 2019-06-07 DIAGNOSIS — Z87891 Personal history of nicotine dependence: Secondary | ICD-10-CM | POA: Insufficient documentation

## 2019-06-07 DIAGNOSIS — Z87442 Personal history of urinary calculi: Secondary | ICD-10-CM | POA: Insufficient documentation

## 2019-06-07 SURGERY — PROSTATE VOLUME STUDIES
Anesthesia: Choice

## 2019-06-07 NOTE — Progress Notes (Signed)
Radiation Oncology Follow up Note  Name: Collie Bambrick III   Date:   06/07/2019 MRN:  [id number] DOB: 1950/10/21    This 68 y.o. male presents to the hospital today for volume study in anticipation of I-125 interstitial implant for stage IIb adenocarcinoma the prostate  REFERRING PROVIDER: Pleas [name], NP  HPI: Patient is a 68 year old obese male presented with a PSA of 12.7.  He was found to have stage T1c N0 M0) Gleason 8 (3+5) adenocarcinoma the prostate..  Bone scan and CT scan of abdomen pelvis showed no evidence to suggest metastatic disease.  Patient was started on androgen deprivation therapy receive external beam radiation therapy to his prostate and pelvic nodes and is now seen for volume study in anticipation of an I-125 interstitial implant for boost.  Patient is doing well although is recovering from COVID-19 which she developed in mid July.  This is left him with some slight cognitive decline also continues to have some mild respiratory symptoms.  He specifically Nuys cough or chest tightness.  COMPLICATIONS OF TREATMENT: none  FOLLOW UP COMPLIANCE: keeps appointments   PHYSICAL EXAM:  There were no vitals taken for this visit. Well-developed well-nourished patient in NAD. HEENT reveals PERLA, EOMI, discs not visualized.  Oral cavity is clear. No oral mucosal lesions are identified. Neck is clear without evidence of cervical or supraclavicular adenopathy. Lungs are clear to A&P. Cardiac examination is essentially unremarkable with regular rate and rhythm without murmur rub or thrill. Abdomen is benign with no organomegaly or masses noted. Motor sensory and DTR levels are equal and symmetric in the upper and lower extremities. Cranial nerves II through XII are grossly intact. Proprioception is intact. No peripheral adenopathy or edema is identified. No motor or sensory levels are noted. Crude visual fields are within normal range.  RADIOLOGY RESULTS: Ultrasound used for  volume study  PLAN: Patient was taken to the cystoscopy suite in the OR. Patient was placed in the low lithotomy position. Foley catheter was placed. Trans-rectal ultrasound probe was inserted into the rectum and prostate seminal vesicles were visualized as well as bladder base. stepping images were performed on a 5 mm increments. Images will be placed in BrachyVision treatment planning system to determine seed placement coordinates for eventual I-125 interstitial implant. Images will be reviewed with the physics and dosimetry staff for final quality approval. I personally was present for the volume study and assisted in delineation of contour volumes.  At the end of the procedure Foley catheter was removed, rectal ultrasound probe was removed. Patient tolerated his procedures extremely well with no side effects or complaints. Patient has given appointment for interstitial implant date. Consent was signed today as well as history and physical performed in preparation for his outpatient surgical implant.     Noreene Filbert, MD

## 2019-06-07 NOTE — H&P (View-Only) (Signed)
Radiation Oncology Follow up Note  Name: Preston Thomas   Date:   06/07/2019 MRN:  [id number] DOB: 03-24-1951    This 68 y.o. male presents to the hospital today for volume study in anticipation of I-125 interstitial implant for stage IIb adenocarcinoma the prostate  REFERRING PROVIDER: Pleas [name], NP  HPI: Patient is a 68 year old obese male presented with a PSA of 12.7.  He was found to have stage T1c N0 M0) Gleason 8 (3+5) adenocarcinoma the prostate..  Bone scan and CT scan of abdomen pelvis showed no evidence to suggest metastatic disease.  Patient was started on androgen deprivation therapy receive external beam radiation therapy to his prostate and pelvic nodes and is now seen for volume study in anticipation of an I-125 interstitial implant for boost.  Patient is doing well although is recovering from COVID-19 which she developed in mid July.  This is left him with some slight cognitive decline also continues to have some mild respiratory symptoms.  He specifically Nuys cough or chest tightness.  COMPLICATIONS OF TREATMENT: none  FOLLOW UP COMPLIANCE: keeps appointments   PHYSICAL EXAM:  There were no vitals taken for this visit. Well-developed well-nourished patient in NAD. HEENT reveals PERLA, EOMI, discs not visualized.  Oral cavity is clear. No oral mucosal lesions are identified. Neck is clear without evidence of cervical or supraclavicular adenopathy. Lungs are clear to A&P. Cardiac examination is essentially unremarkable with regular rate and rhythm without murmur rub or thrill. Abdomen is benign with no organomegaly or masses noted. Motor sensory and DTR levels are equal and symmetric in the upper and lower extremities. Cranial nerves II through XII are grossly intact. Proprioception is intact. No peripheral adenopathy or edema is identified. No motor or sensory levels are noted. Crude visual fields are within normal range.  RADIOLOGY RESULTS: Ultrasound used for  volume study  PLAN: Patient was taken to the cystoscopy suite in the OR. Patient was placed in the low lithotomy position. Foley catheter was placed. Trans-rectal ultrasound probe was inserted into the rectum and prostate seminal vesicles were visualized as well as bladder base. stepping images were performed on a 5 mm increments. Images will be placed in BrachyVision treatment planning system to determine seed placement coordinates for eventual I-125 interstitial implant. Images will be reviewed with the physics and dosimetry staff for final quality approval. I personally was present for the volume study and assisted in delineation of contour volumes.  At the end of the procedure Foley catheter was removed, rectal ultrasound probe was removed. Patient tolerated his procedures extremely well with no side effects or complaints. Patient has given appointment for interstitial implant date. Consent was signed today as well as history and physical performed in preparation for his outpatient surgical implant.     Noreene Filbert, MD

## 2019-06-07 NOTE — H&P (Signed)
NEW PATIENT EVALUATION  Name: Preston Thomas  MRN: [id number]  Date:   06/07/2019     DOB: April 27, 1951   This 68 y.o. male patient presents to the clinic for history and physical in anticipation of I-125 interstitial implant for boost for stage IIb adenocarcinoma the prostate  REFERRING PHYSICIAN: Pleas [name], NP  CHIEF COMPLAINT:  Chief Complaint  Patient presents with  . Prostate Cancer    follow up    DIAGNOSIS: The encounter diagnosis was Malignant neoplasm of prostate ([place]).   PREVIOUS INVESTIGATIONS:  Notes reviewed Pathology reviewed Volume study performed  HPI: Patient is a 68 year old male who presented with an elevated PSA of 12.7.  Biopsy of his prostate revealed a mixture of Gleason 6 and Gleason 8 and Gleason 9 adenocarcinoma.  There was perineural invasion involved.  Bone scan and CT scan of abdomen pelvis showed no evidence to suggest metastatic disease or lymphadenopathy.  He has been started on androgen deprivation therapy is undergone external beam radiation therapy to both his prostate and pelvic nodes seen today for volume study in anticipation of I-125 interstitial implant for boost.  His lower urinary tract symptoms have improved.  He is otherwise without complaint.  PLANNED TREATMENT REGIMEN: I-125 interstitial implant for boost  PAST MEDICAL HISTORY:  has a past medical history of COLONIC POLYPS, HX OF (08/31/2007), DIVERTICULITIS, HX OF (08/31/2007), DIVERTICULOSIS, COLON (08/31/2007), GERD (gastroesophageal reflux disease), and RENAL CALCULUS, HX OF (08/31/2007).    PAST SURGICAL HISTORY:  Past Surgical History:  Procedure Laterality Date  . APPENDECTOMY  1987  . CHOLECYSTECTOMY  1987    FAMILY HISTORY: family history includes Early death in his father; Kidney disease in his father.  SOCIAL HISTORY:  reports that he quit smoking about 34 years ago. His smoking use included cigarettes. He quit after 10.00 years of use. He has never used  smokeless tobacco. He reports previous alcohol use. He reports that he does not use drugs.  ALLERGIES: Penicillins  MEDICATIONS:  Current Outpatient Medications  Medication Preston Dispense Refill  . acetaminophen (TYLENOL) 500 MG tablet Take 1,000 mg by mouth every 8 (eight) hours as needed for moderate pain.    [name] atorvastatin (LIPITOR) 20 MG tablet Take 1 tablet (20 mg total) by mouth daily. For cholesterol. 90 tablet 3  . Cholecalciferol (DIALYVITE VITAMIN D 5000) 125 MCG (5000 UT) capsule Take 5,000 Units by mouth daily.    [name] loratadine (CLARITIN) 10 MG tablet Take 10 mg by mouth daily as needed for allergies.    [name] omeprazole (PRILOSEC) 20 MG capsule Take 20 mg by mouth daily.    . SUMAtriptan (IMITREX) 100 MG tablet Take 1 tablet by mouth at migraine onset. May repeat in 2 hours if headache persists or recurs. Do not exceed 100 mg in 24 hours. 10 tablet 0  . vitamin C (ASCORBIC ACID) 250 MG tablet Take 250 mg by mouth daily.     No current facility-administered medications for this encounter.    Facility-Administered Medications Ordered in Other Encounters  Medication Dose Route Frequency Provider Last Rate Last Dose  . ciprofloxacin (CIPRO) IVPB 400 mg  400 mg Intravenous 60 min Pre-Op Stoioff, Scott C, MD        ECOG PERFORMANCE STATUS:  0 - Asymptomatic  REVIEW OF SYSTEMS: Patient denies any weight loss, fatigue, weakness, fever, chills or night sweats. Patient denies any loss of vision, blurred vision. Patient denies any ringing  of the ears or hearing loss. No irregular  heartbeat. Patient denies heart murmur or history of fainting. Patient denies any chest pain or pain radiating to her upper extremities. Patient denies any shortness of breath, difficulty breathing at night, cough or hemoptysis. Patient denies any swelling in the lower legs. Patient denies any nausea vomiting, vomiting of blood, or coffee ground material in the vomitus. Patient denies any stomach pain. Patient states  has had normal bowel movements no significant constipation or diarrhea. Patient denies any dysuria, hematuria or significant nocturia. Patient denies any problems walking, swelling in the joints or loss of balance. Patient denies any skin changes, loss of hair or loss of weight. Patient denies any excessive worrying or anxiety or significant depression. Patient denies any problems with insomnia. Patient denies excessive thirst, polyuria, polydipsia. Patient denies any swollen glands, patient denies easy bruising or easy bleeding. Patient denies any recent infections, allergies or URI. Patient "s visual fields have not changed significantly in recent time.   PHYSICAL EXAM: BP (!) 148/95   Pulse 91   Temp (!) 96.5 F (35.8 C)   Resp 18  Mildly obese male in NAD.  Well-developed well-nourished patient in NAD. HEENT reveals PERLA, EOMI, discs not visualized.  Oral cavity is clear. No oral mucosal lesions are identified. Neck is clear without evidence of cervical or supraclavicular adenopathy. Lungs are clear to A&P. Cardiac examination is essentially unremarkable with regular rate and rhythm without murmur rub or thrill. Abdomen is benign with no organomegaly or masses noted. Motor sensory and DTR levels are equal and symmetric in the upper and lower extremities. Cranial nerves II through XII are grossly intact. Proprioception is intact. No peripheral adenopathy or edema is identified. No motor or sensory levels are noted. Crude visual fields are within normal range.  LABORATORY DATA: Pathology report reviewed    RADIOLOGY RESULTS: CT scans and bone scan reviewed   IMPRESSION: Stage IIb high-grade adenocarcinoma the prostate in 68 year old male already started on androgen deprivation therapy and has completed external beam radiation therapy to pelvic nodes as well as prostate now for I-125 interstitial implant.  PLAN: At this time risks and benefits of radiation therapy with I-125 interstitial implant  including radiation safety precautions were reviewed with the patient.  We performed volume study today for treatment planning purposes.  Risks and benefits of surgery including the risks of general anesthesia were all reviewed with the patient he comprehends her treatment plan well and is cleared to proceed with surgery.  Patient had COVID-19 with significant pulmonary symptoms back in July he does seem to have some residual slight cognitive decline as well as minor pulmonary complaints.  I would like to take this opportunity to thank you for allowing me to participate in the care of your patient.Noreene Filbert, MD

## 2019-06-08 DIAGNOSIS — C61 Malignant neoplasm of prostate: Secondary | ICD-10-CM | POA: Diagnosis not present

## 2019-06-15 DIAGNOSIS — K5730 Diverticulosis of large intestine without perforation or abscess without bleeding: Secondary | ICD-10-CM | POA: Diagnosis not present

## 2019-06-15 DIAGNOSIS — N200 Calculus of kidney: Secondary | ICD-10-CM | POA: Diagnosis not present

## 2019-06-15 DIAGNOSIS — N132 Hydronephrosis with renal and ureteral calculous obstruction: Secondary | ICD-10-CM | POA: Diagnosis not present

## 2019-06-15 DIAGNOSIS — Z87891 Personal history of nicotine dependence: Secondary | ICD-10-CM | POA: Diagnosis not present

## 2019-06-15 DIAGNOSIS — I10 Essential (primary) hypertension: Secondary | ICD-10-CM | POA: Diagnosis not present

## 2019-06-20 ENCOUNTER — Other Ambulatory Visit: Payer: Self-pay | Admitting: Radiology

## 2019-06-21 ENCOUNTER — Encounter
Admission: RE | Admit: 2019-06-21 | Discharge: 2019-06-21 | Disposition: A | Discharge status: Home / Self Care | Payer: PPO | Source: Ambulatory Visit | Attending: Urology | Admitting: Urology

## 2019-06-21 ENCOUNTER — Other Ambulatory Visit: Payer: Self-pay

## 2019-06-21 HISTORY — DX: Personal history of urinary calculi: Z87.442

## 2019-06-21 HISTORY — DX: Dyspnea, unspecified: R06.00

## 2019-06-21 NOTE — Progress Notes (Signed)
Pre-Admit Testing Provider Notification Note  Provider Notified: Dr. Bernardo Heater (Amy Heidel, RN)  Notification Mode: Secure Chat  Reason: CBC ordered, But patient had one on 05/17/19 with normal results.  Response: Instructed to cancel CBC ordered for today.  Additional Information: CBC canceled for PAT visit.  Signed: Beulah Gandy, RN

## 2019-06-21 NOTE — Patient Instructions (Signed)
Your procedure is scheduled on: Tuesday 06/27/19.  Report to DAY SURGERY DEPARTMENT LOCATED ON 2ND FLOOR MEDICAL MALL ENTRANCE. To find out your arrival time please call [phone] between 1PM - 3PM on Monday 06/26/19.   Remember: Instructions that are not followed completely may result in serious medical risk, up to and including death, or upon the discretion of your surgeon and anesthesiologist your surgery may need to be rescheduled.      _X__ 1. Do not eat food after midnight the night before your procedure.                 No gum chewing or hard candies. You may drink clear liquids up to 2 hours                 before you are scheduled to arrive for your surgery- DO NOT drink clear                 liquids within 2 hours of the start of your surgery.                 Clear Liquids include:  water, apple juice without pulp, clear carbohydrate                 drink such as Clearfast or Gatorade, Black Coffee or Tea (Do not add                 milk or creamer to coffee or tea).    __X__2.  On the morning of surgery brush your teeth with toothpaste and water, you may rinse your mouth with mouthwash if you wish.  Do not swallow any toothpaste or mouthwash.      __X__6.  Notify your doctor if there is any change in your medical condition      (cold, fever, infections).      Do not wear jewelry, make-up, hairpins, clips or nail polish. Do not wear lotions, powders, or perfumes.  Do not shave 48 hours prior to surgery. Men may shave face and neck. Do not bring valuables to the hospital.     [facility] is not responsible for any belongings or valuables.   Contacts, dentures/partials or body piercings may not be worn into surgery. Bring a case for your contacts, glasses or hearing aids, a denture cup will be supplied.    Patients discharged the day of surgery will not be allowed to drive home.    Please read over the following fact sheets that you were given:   MRSA  Information   __X__ Take these medicines the morning of surgery with A SIP OF WATER:     1. omeprazole (PRILOSEC) 20 MG capsule. Please add a dose the night before your procedure.  2. atorvastatin (LIPITOR) 20 MG tablet  3. SUMAtriptan (IMITREX) 100 MG tablet if needed.     __X__ Use CHG Soap/SAGE wipes as directed    __X__ Stop Anti-inflammatories 7 days before surgery such as Advil, Ibuprofen, Motrin, BC or Goodies Powder, Naprosyn, Naproxen, Aleve, Aspirin, Meloxicam. May take Tylenol if needed for pain or discomfort.     __X__ Do not begin taking any new herbal supplements before your procedure.

## 2019-06-27 ENCOUNTER — Ambulatory Visit
Admission: RE | Admit: 2019-06-27 | Discharge: 2019-06-27 | Disposition: A | Discharge status: Home / Self Care | Payer: PPO | Source: Ambulatory Visit | Attending: Radiation Oncology | Admitting: Radiation Oncology

## 2019-06-27 ENCOUNTER — Telehealth: Payer: Self-pay | Admitting: Pulmonary Disease

## 2019-06-27 ENCOUNTER — Encounter: Payer: Self-pay | Admitting: *Deleted

## 2019-06-27 ENCOUNTER — Ambulatory Visit: Payer: PPO | Admitting: Anesthesiology

## 2019-06-27 ENCOUNTER — Encounter
Admission: RE | Disposition: A | Discharge status: Home / Self Care | Payer: Self-pay | Source: Home / Self Care | Attending: Urology

## 2019-06-27 ENCOUNTER — Other Ambulatory Visit: Payer: Self-pay

## 2019-06-27 ENCOUNTER — Ambulatory Visit
Admission: RE | Admit: 2019-06-27 | Discharge: 2019-06-27 | Disposition: A | Discharge status: Home / Self Care | Payer: PPO | Attending: Urology | Admitting: Urology

## 2019-06-27 DIAGNOSIS — C61 Malignant neoplasm of prostate: Secondary | ICD-10-CM | POA: Diagnosis not present

## 2019-06-27 DIAGNOSIS — K219 Gastro-esophageal reflux disease without esophagitis: Secondary | ICD-10-CM | POA: Insufficient documentation

## 2019-06-27 DIAGNOSIS — Z6838 Body mass index (BMI) 38.0-38.9, adult: Secondary | ICD-10-CM | POA: Insufficient documentation

## 2019-06-27 DIAGNOSIS — Z79899 Other long term (current) drug therapy: Secondary | ICD-10-CM | POA: Insufficient documentation

## 2019-06-27 DIAGNOSIS — Z87891 Personal history of nicotine dependence: Secondary | ICD-10-CM | POA: Insufficient documentation

## 2019-06-27 DIAGNOSIS — Z8619 Personal history of other infectious and parasitic diseases: Secondary | ICD-10-CM | POA: Diagnosis not present

## 2019-06-27 DIAGNOSIS — U071 COVID-19: Secondary | ICD-10-CM | POA: Diagnosis not present

## 2019-06-27 DIAGNOSIS — Z923 Personal history of irradiation: Secondary | ICD-10-CM | POA: Insufficient documentation

## 2019-06-27 DIAGNOSIS — E669 Obesity, unspecified: Secondary | ICD-10-CM | POA: Diagnosis not present

## 2019-06-27 HISTORY — PX: RADIOACTIVE SEED IMPLANT: SHX5150

## 2019-06-27 SURGERY — INSERTION, RADIATION SOURCE, PROSTATE
Anesthesia: General | Site: Perineum

## 2019-06-27 MED ORDER — ONDANSETRON HCL 4 MG/2ML IJ SOLN: 4.0000 mg | Freq: Once | INTRAMUSCULAR | Status: DC | PRN

## 2019-06-27 MED ORDER — FLEET ENEMA 7-19 GM/118ML RE ENEM: 1.0000 | ENEMA | Freq: Once | RECTAL | Status: DC

## 2019-06-27 MED ORDER — SUGAMMADEX SODIUM 200 MG/2ML IV SOLN
INTRAVENOUS | Status: AC
  Filled 2019-06-27: qty 2

## 2019-06-27 MED ORDER — ONDANSETRON HCL 4 MG/2ML IJ SOLN
INTRAMUSCULAR | Status: AC
  Filled 2019-06-27: qty 2

## 2019-06-27 MED ORDER — LIDOCAINE HCL (PF) 2 % IJ SOLN
INTRAMUSCULAR | Status: AC
  Filled 2019-06-27: qty 10

## 2019-06-27 MED ORDER — BACITRACIN 500 UNIT/GM EX OINT
TOPICAL_OINTMENT | CUTANEOUS | Status: DC | PRN
  Administered 2019-06-27: 1 via TOPICAL

## 2019-06-27 MED ORDER — ROCURONIUM BROMIDE 50 MG/5ML IV SOLN
INTRAVENOUS | Status: AC
  Filled 2019-06-27: qty 1

## 2019-06-27 MED ORDER — ROCURONIUM BROMIDE 100 MG/10ML IV SOLN
INTRAVENOUS | Status: DC | PRN
  Administered 2019-06-27: 50 mg via INTRAVENOUS

## 2019-06-27 MED ORDER — LIDOCAINE HCL 4 % MT SOLN
OROMUCOSAL | Status: DC | PRN
  Administered 2019-06-27: 4 mL via TOPICAL

## 2019-06-27 MED ORDER — FENTANYL CITRATE (PF) 250 MCG/5ML IJ SOLN
INTRAMUSCULAR | Status: DC | PRN
  Administered 2019-06-27 (×2): 50 ug via INTRAVENOUS

## 2019-06-27 MED ORDER — ONDANSETRON HCL 4 MG/2ML IJ SOLN
INTRAMUSCULAR | Status: DC | PRN
  Administered 2019-06-27: 4 mg via INTRAVENOUS

## 2019-06-27 MED ORDER — LACTATED RINGERS IV SOLN
INTRAVENOUS | Status: DC
  Administered 2019-06-27: 07:00:00 via INTRAVENOUS

## 2019-06-27 MED ORDER — CIPROFLOXACIN IN D5W 400 MG/200ML IV SOLN
INTRAVENOUS | Status: DC | PRN
  Administered 2019-06-27: 400 mg via INTRAVENOUS

## 2019-06-27 MED ORDER — MIDAZOLAM HCL 2 MG/2ML IJ SOLN
INTRAMUSCULAR | Status: DC | PRN
  Administered 2019-06-27: 2 mg via INTRAVENOUS

## 2019-06-27 MED ORDER — PROPOFOL 10 MG/ML IV BOLUS
INTRAVENOUS | Status: AC
  Filled 2019-06-27: qty 20

## 2019-06-27 MED ORDER — HYDROCODONE-ACETAMINOPHEN 5-325 MG PO TABS: 1.0000 | ORAL_TABLET | ORAL | 0 refills | Status: DC | PRN

## 2019-06-27 MED ORDER — MIDAZOLAM HCL 2 MG/2ML IJ SOLN
INTRAMUSCULAR | Status: AC
  Filled 2019-06-27: qty 2

## 2019-06-27 MED ORDER — FENTANYL CITRATE (PF) 100 MCG/2ML IJ SOLN
INTRAMUSCULAR | Status: AC
  Filled 2019-06-27: qty 2

## 2019-06-27 MED ORDER — CIPROFLOXACIN IN D5W 400 MG/200ML IV SOLN
INTRAVENOUS | Status: AC
  Filled 2019-06-27: qty 200

## 2019-06-27 MED ORDER — DEXAMETHASONE SODIUM PHOSPHATE 10 MG/ML IJ SOLN
INTRAMUSCULAR | Status: AC
  Filled 2019-06-27: qty 1

## 2019-06-27 MED ORDER — PROPOFOL 10 MG/ML IV BOLUS
INTRAVENOUS | Status: DC | PRN
  Administered 2019-06-27: 180 mg via INTRAVENOUS

## 2019-06-27 MED ORDER — PHENYLEPHRINE HCL (PRESSORS) 10 MG/ML IV SOLN
INTRAVENOUS | Status: AC
  Filled 2019-06-27: qty 1

## 2019-06-27 MED ORDER — FENTANYL CITRATE (PF) 100 MCG/2ML IJ SOLN: 25.0000 ug | INTRAMUSCULAR | Status: DC | PRN

## 2019-06-27 MED ORDER — SUGAMMADEX SODIUM 200 MG/2ML IV SOLN
INTRAVENOUS | Status: DC | PRN
  Administered 2019-06-27 (×2): 200 mg via INTRAVENOUS

## 2019-06-27 MED ORDER — DEXAMETHASONE SODIUM PHOSPHATE 10 MG/ML IJ SOLN
INTRAMUSCULAR | Status: DC | PRN
  Administered 2019-06-27: 10 mg via INTRAVENOUS

## 2019-06-27 MED ORDER — LIDOCAINE HCL (CARDIAC) PF 100 MG/5ML IV SOSY
PREFILLED_SYRINGE | INTRAVENOUS | Status: DC | PRN
  Administered 2019-06-27: 100 mg via INTRATRACHEAL

## 2019-06-27 MED ORDER — PHENYLEPHRINE HCL (PRESSORS) 10 MG/ML IV SOLN
INTRAVENOUS | Status: DC | PRN
  Administered 2019-06-27 (×3): 100 ug via INTRAVENOUS

## 2019-06-27 SURGICAL SUPPLY — 25 items
BAG URINE DRAINAGE (UROLOGICAL SUPPLIES) ×3 IMPLANT
BLADE CLIPPER SURG (BLADE) ×3 IMPLANT
CATH FOL 2WAY LX 16X5 (CATHETERS) ×3 IMPLANT
COVER BACK TABLE REUSABLE LG (DRAPES) ×3 IMPLANT
DRAPE INCISE 23X17 IOBAN STRL (DRAPES) ×2
DRAPE INCISE 23X17 STRL (DRAPES) ×1 IMPLANT
DRAPE INCISE IOBAN 23X17 STRL (DRAPES) ×1 IMPLANT
DRAPE UNDER BUTTOCK W/FLU (DRAPES) ×3 IMPLANT
DRSG TELFA 3X8 NADH (GAUZE/BANDAGES/DRESSINGS) ×3 IMPLANT
GLOVE BIO SURGEON STRL SZ7.5 (GLOVE) ×6 IMPLANT
GLOVE BIO SURGEON STRL SZ8 (GLOVE) ×3 IMPLANT
GOWN STRL REUS W/ TWL LRG LVL3 (GOWN DISPOSABLE) ×2 IMPLANT
GOWN STRL REUS W/ TWL XL LVL3 (GOWN DISPOSABLE) ×2 IMPLANT
GOWN STRL REUS W/TWL LRG LVL3 (GOWN DISPOSABLE) ×6
GOWN STRL REUS W/TWL XL LVL3 (GOWN DISPOSABLE) ×6
KIT TURNOVER CYSTO (KITS) ×3 IMPLANT
PACK CYSTO AR (MISCELLANEOUS) ×3 IMPLANT
PAD DRESSING TELFA 3X8 NADH (GAUZE/BANDAGES/DRESSINGS) ×1 IMPLANT
SET CYSTO W/LG BORE CLAMP LF (SET/KITS/TRAYS/PACK) ×3 IMPLANT
SUPPORT SCROTAL LRG (SOFTGOODS) ×1
SUPPORT SCROTAL LRG NO STRP (SOFTGOODS) ×1 IMPLANT
SURGILUBE 2OZ TUBE FLIPTOP (MISCELLANEOUS) ×3 IMPLANT
SYR 10ML LL (SYRINGE) ×3 IMPLANT
SYRINGE IRR TOOMEY STRL 70CC (SYRINGE) IMPLANT
WATER STERILE IRR 1000ML POUR (IV SOLUTION) ×3 IMPLANT

## 2019-06-27 NOTE — Op Note (Signed)
Preoperative diagnosis: Adenocarcinoma of the prostate (T1c high risk)  Postoperative diagnosis: Adenocarcinoma the prostate (T1c high risk)  Procedure:  1. I-125 prostate interstitial implant  2. cystoscopy  Surgeon: John Giovanni, M.D.   Radiation oncologist: Lavena Stanford, M.D.   Anesthesia: General  Drains: none  Complications: none  Indications: 68 y.o. male with clinical T1c high risk adenocarcinoma the prostate.  Seen by radiation oncology and treatment plan consist of IMRT + brachytherapy+ ADT.  He presents today for I-125 implant.  Procedure: Patient was brought to operating suite and placement table in the supine position. At this time, a universal timeout protocol was performed, all team members were identified, Venodyne boots are placed, and he was administered IV Cipro in the preoperative period. He was placed in lithotomy position and prepped and draped in usual manner. Radiation oncology department placed a transrectal ultrasound probe anchoring stand/ grid and aligned with previous imaging from the volume study. Foley catheter was inserted without difficulty.  All needle passage was done with real-time transrectal ultrasound guidance in both the transverse and sagittal plains in order to achieve the desired preplanned position. A total of 20 needles were placed.  63 active seeds were implanted. The Foley catheter was removed and a rigid cystoscopy failed to show any seeds outside the prostate without evidence of trauma to the urethral, prostatic fossa, or bladder.  The bladder was drained.  A fluoroscopic image was then obtained showing excellent distrubution of the brachytherapy seeds.  Each seed was counted and counts were correct.    The patient was then repositioned in the supine position, reversed from anesthesia, and taken to the PACU in stable condition.   John Giovanni, MD

## 2019-06-27 NOTE — Transfer of Care (Signed)
Immediate Anesthesia Transfer of Care Note  Patient: Preston Thomas  Procedure(s) Performed: RADIOACTIVE SEED IMPLANT/BRACHYTHERAPY IMPLANT (N/A Perineum)  Patient Location: PACU  Anesthesia Type:General  Level of Consciousness: drowsy  Airway & Oxygen Therapy: Patient Spontanous Breathing and Patient connected to face mask oxygen  Post-op Assessment: Report given to RN and Post -op Vital signs reviewed and stable  Post vital signs: Reviewed and stable  Last Vitals:  Vitals Value Taken Time  BP 140/89 06/27/19 0840  Temp 36.6 C 06/27/19 0840  Pulse 93 06/27/19 0843  Resp 13 06/27/19 0843  SpO2 100 % 06/27/19 0843  Vitals shown include unvalidated device data.  Last Pain:  Vitals:   06/27/19 0840  TempSrc:   PainSc: 0-No pain      Patients Stated Pain Goal: 0 ([id number])  Complications: No apparent anesthesia complications

## 2019-06-27 NOTE — Interval H&P Note (Signed)
History and Physical Interval Note:  06/27/2019 7:21 AM  Preston Thomas  has presented today for surgery, with the diagnosis of prostate cancer.  The various methods of treatment have been discussed with the patient and family. After consideration of risks, benefits and other options for treatment, the patient has consented to  Procedure(s): RADIOACTIVE SEED IMPLANT/BRACHYTHERAPY IMPLANT (N/A) as a surgical intervention.  The patient's history has been reviewed, patient examined, no change in status, stable for surgery.  I have reviewed the patient's chart and labs.  Questions were answered to the patient's satisfaction.     [place]

## 2019-06-27 NOTE — Telephone Encounter (Signed)
Patient returned call and patient was advised of our situation at the [facility] and the [facility]. Pt stated that he lived close to [place] and that he would be willing to go there.  Advised patient that I would send his order to one of my co-workers at the [employer] and they will contact him to arrange set up. Rhonda J Cobb

## 2019-06-27 NOTE — Telephone Encounter (Signed)
Rhonda please advise 

## 2019-06-27 NOTE — Anesthesia Post-op Follow-up Note (Signed)
Anesthesia QCDR form completed.        

## 2019-06-27 NOTE — Discharge Instructions (Signed)
Brachytherapy for Prostate Cancer, Care After ° °This sheet gives you information about how to care for yourself after your procedure. Your health care provider may also give you more specific instructions. If you have problems or questions, contact your health care provider. °What can I expect after the procedure? °After the procedure, it is common to have: °· Trouble passing urine. °· Blood in the urine or semen. °· Constipation. °· Frequent feeling of an urgent need to urinate. °· Bruising, swelling, and tenderness of the area behind the scrotum (perineum). °· Bloating and gas. °· Fatigue. °· Burning or pain in the rectum. °· Problems getting or keeping an erection (erectile dysfunction). °· Nausea. °Follow these instructions at home: °Managing pain, stiffness, and swelling °· If directed, apply ice to the affected area: °? Put ice in a plastic bag. °? Place a towel between your skin and the bag. °? Leave the ice on for 20 minutes, 2-3 times a day. °· Try not to sit directly on the area behind the scrotum. A soft cushion can help with discomfort. °Activity °· Do not drive for 24 hours if you were given a medicine to help you relax (sedative). °· Do not drive or use heavy machinery while taking prescription pain medicine. °· Rest as told by your health care provider. °· Most people can return to normal activities a few days or weeks after the procedure. Ask your health care provider what activities are safe for you. °Eating and drinking °· Drink enough fluid to keep your urine clear or pale yellow. °· Eat a healthy, balanced diet. This includes lean proteins, whole grains, and plenty of fruits and vegetables. °General instructions °· Take over-the-counter and prescription medicines only as told by your health care provider. °· Keep all follow-up visits as told by your health care provider. This is important. You may still need additional treatment. °· Do not take baths, swim, or use a hot tub until your health  care provider approves. Shower and wash the area behind the scrotum gently. °· Do not have sex for one week after the treatment, or until your health care provider approves. °· If you have permanent, low-dose brachytherapy implants: °? Limit close contact with children and pregnant women for 2 months or as told by your health care provider. This is important because of the radiation that is still active in the prostate. °? You may set off radioactive sensors, such as airport screenings. Ask your health care provider for a document that explains your treatment. °? You may be instructed to use a condom during sex for the first 2 months after low-dose brachytherapy. °Contact a health care provider if: °· You have a fever or chills. °· You do not have a bowel movement for 3-4 days after the procedure. °· You have diarrhea for 3-4 days after the procedure. °· You develop any new symptoms, such as problems with urinating or erectile dysfunction. °· You have abdomen (abdominal) pain. °· You have more blood in your urine. °Get help right away if: °· You cannot urinate. °· There is excessive bleeding from your rectum. °· You have unusual drainage coming from your rectum. °· You have severe pain in the treated area that does not go away with pain medicine. °· You have severe nausea or vomiting. °Summary °· If you have permanent, low-dose brachytherapy implants, limit close contact with children and pregnant women for 2 months or as told by your health care provider. This is important because of the radiation   that is still active in the prostate.  Talk with your health care provider about your risk of brachytherapy side effects, such as erectile dysfunction or urinary problems. Your health care provider will be able to recommend possible treatment options.  Keep all follow-up visits as told by your health care provider. This is important. You may need additional treatment. This information is not intended to replace  advice given to you by your health care provider. Make sure you discuss any questions you have with your health care provider.  AMBULATORY SURGERY  DISCHARGE INSTRUCTIONS   1) The drugs that you were given will stay in your system until tomorrow so for the next 24 hours you should not:  A) Drive an automobile B) Make any legal decisions C) Drink any alcoholic beverage   2) You may resume regular meals tomorrow.  Today it is better to start with liquids and gradually work up to solid foods.  You may eat anything you prefer, but it is better to start with liquids, then soup and crackers, and gradually work up to solid foods.   3) Please notify your doctor immediately if you have any unusual bleeding, trouble breathing, redness and pain at the surgery site, drainage, fever, or pain not relieved by medication.    4) Additional Instructions:        Please contact your physician with any problems or Same Day Surgery at [phone], Monday through Friday 6 am to 4 pm, or  [facility] number at [phone]. Document Released: 10/17/2010 Document Revised: 08/27/2017 Document Reviewed: 10/16/2016 Elsevier Patient Education  2020 [employer].

## 2019-06-27 NOTE — Anesthesia Procedure Notes (Signed)
Procedure Name: Intubation Date/Time: 06/27/2019 7:43 AM Performed by: Eben Burow, CRNA Pre-anesthesia Checklist: Patient identified, Emergency Drugs available, Suction available and Patient being monitored Patient Re-evaluated:Patient Re-evaluated prior to induction Oxygen Delivery Method: Circle system utilized Preoxygenation: Pre-oxygenation with 100% oxygen Induction Type: IV induction Ventilation: Mask ventilation without difficulty and Oral airway inserted - appropriate to patient size Laryngoscope Size: McGraph and 3 Grade View: Grade I Tube type: Oral Tube size: 7.5 mm Number of attempts: 1 Airway Equipment and Method: Stylet,  Oral airway,  Video-laryngoscopy and LTA kit utilized Placement Confirmation: positive ETCO2 and breath sounds checked- equal and bilateral Secured at: 22 cm Tube secured with: Tape Dental Injury: Teeth and Oropharynx as per pre-operative assessment

## 2019-06-27 NOTE — Telephone Encounter (Signed)
LMOVM for pt to return call on HST.  Insurance has approved. Rhonda J Cobb

## 2019-06-27 NOTE — Anesthesia Postprocedure Evaluation (Signed)
Anesthesia Post Note  Patient: Preston Thomas  Procedure(s) Performed: RADIOACTIVE SEED IMPLANT/BRACHYTHERAPY IMPLANT (N/A Perineum)  Patient location during evaluation: PACU Anesthesia Type: General Level of consciousness: awake and alert Pain management: pain level controlled Vital Signs Assessment: post-procedure vital signs reviewed and stable Respiratory status: spontaneous breathing, nonlabored ventilation, respiratory function stable and patient connected to nasal cannula oxygen Cardiovascular status: blood pressure returned to baseline and stable Postop Assessment: no apparent nausea or vomiting Anesthetic complications: no     Last Vitals:  Vitals:   06/27/19 0931 06/27/19 1018  BP: 139/78 (!) 148/87  Pulse: 85 81  Resp: 18 18  Temp: (!) 35.9 C (!) 35.9 C  SpO2: 97% 98%    Last Pain:  Vitals:   06/27/19 1018  TempSrc:   PainSc: 0-No pain                 Martha Clan

## 2019-06-27 NOTE — Anesthesia Preprocedure Evaluation (Signed)
Anesthesia Evaluation  Patient identified by MRN, date of birth, ID band Patient awake    Reviewed: Allergy & Precautions, H&P , NPO status , Patient's Chart, lab work & pertinent test results, reviewed documented beta blocker date and time   History of Anesthesia Complications Negative for: history of anesthetic complications  Airway Mallampati: II  TM Distance: >3 FB Neck ROM: full    Dental  (+) Teeth Intact, Dental Advidsory Given   Pulmonary shortness of breath and with exertion, neg COPD, Recent URI  (Positive for COVID, now recovered), former smoker,    Pulmonary exam normal        Cardiovascular Exercise Tolerance: Good negative cardio ROS Normal cardiovascular exam     Neuro/Psych negative neurological ROS  negative psych ROS   GI/Hepatic Neg liver ROS, GERD  ,  Endo/Other  negative endocrine ROS  Renal/GU Renal disease (klidney stones)  negative genitourinary   Musculoskeletal   Abdominal   Peds  Hematology negative hematology ROS (+)   Anesthesia Other Findings Past Medical History: 08/31/2007: COLONIC POLYPS, HX OF     Comment:  Qualifier: Diagnosis of  By: Silvio Pate MD, [facility] Cancer  08/31/2007: DIVERTICULITIS, HX OF     Comment:  Qualifier: Diagnosis of  ByJoesph Fillers CMA (AAMA), Natasha 08/31/2007: DIVERTICULOSIS, COLON     Comment:  Qualifier: Diagnosis of  By: Silvio Pate MD, Baird Cancer  No date: Dyspnea     Comment:  Related to Covid No date: GERD (gastroesophageal reflux disease) No date: History of kidney stones 08/31/2007: RENAL CALCULUS, HX OF     Comment:  Qualifier: Diagnosis of  By: Chavers CMA (AAMA), Natasha   Reproductive/Obstetrics negative OB ROS                             Anesthesia Physical Anesthesia Plan  ASA: II  Anesthesia Plan: General   Post-op Pain Management:    Induction: Intravenous  PONV Risk Score and Plan: 2 and Ondansetron, Dexamethasone,  Midazolam and Treatment may vary due to age or medical condition  Airway Management Planned: Oral ETT  Additional Equipment:   Intra-op Plan:   Post-operative Plan: Extubation in OR  Informed Consent: I have reviewed the patients History and Physical, chart, labs and discussed the procedure including the risks, benefits and alternatives for the proposed anesthesia with the patient or authorized representative who has indicated his/her understanding and acceptance.     Dental Advisory Given  Plan Discussed with: Anesthesiologist, CRNA and Surgeon  Anesthesia Plan Comments:         Anesthesia Quick Evaluation

## 2019-06-27 NOTE — Progress Notes (Signed)
Radiation Oncology Follow up Note  Name: Preston Thomas   Date:   06/27/2019 MRN:  [id number] DOB: 1951/02/18    This 68 y.o. male presents to the hospital today for I-125 interstitial implant for a Gleason 8 (3+5) adenocarcinoma the prostate already receiving radiation therapy external beam to his prostate and pelvic nodes as well as androgen deprivation therapy.  REFERRING PROVIDER: Pleas [name], NP  HPI: Patient is a 68 year old male who presented with a PSA of 12.7 found to have a T1c N0 Gleason 8 (3+5) adenocarcinoma the prostate.  He was started on androgen deprivation therapy has completed external beam radiation therapy to his prostate and pelvic nodes seen today for I-125 interstitial implant for boost..  COMPLICATIONS OF TREATMENT: none  FOLLOW UP COMPLIANCE: keeps appointments   PHYSICAL EXAM:  There were no vitals taken for this visit. Well-developed well-nourished patient in NAD. HEENT reveals PERLA, EOMI, discs not visualized.  Oral cavity is clear. No oral mucosal lesions are identified. Neck is clear without evidence of cervical or supraclavicular adenopathy. Lungs are clear to A&P. Cardiac examination is essentially unremarkable with regular rate and rhythm without murmur rub or thrill. Abdomen is benign with no organomegaly or masses noted. Motor sensory and DTR levels are equal and symmetric in the upper and lower extremities. Cranial nerves II through XII are grossly intact. Proprioception is intact. No peripheral adenopathy or edema is identified. No motor or sensory levels are noted. Crude visual fields are within normal range.  RADIOLOGY RESULTS: Ultrasound used for source placement  PLAN: Patient was taken to the operating room and general anesthesia was administered. Legs were immobilized in stirrups and patient was positioned in the exact same proportions as original volume study. Patient was prepped and Foley catheter was placed. Ultrasound guidance  identified the prostate and recreated the original set up as per treatment planning volume study. 20 needles were placed under ultrasound guidance with PVCs delivered to the prostate volume.  A total of 63 seeds were deposited after completion of procedure cystoscopy was performed by urology and no evidence of seeds in the bladder were noted. Patient tolerated the procedure extremely well. Initial plain film as doublecheck identified 80 seeds in the prostate. Patient has followup appointment in one month for CT scan for quality assurance will be performed.    Noreene Filbert, MD

## 2019-06-27 NOTE — OR Nursing (Signed)
Pt moved from stretcher to chair and dressing came off as he was moving over. Moderate amount of bloody drainage noted coming from penis. Pressure applied with abd dressing bleeding subsided.

## 2019-06-28 ENCOUNTER — Ambulatory Visit: Payer: PPO

## 2019-07-04 NOTE — Telephone Encounter (Signed)
Need script for discontinued  the oxygen

## 2019-07-04 NOTE — Telephone Encounter (Signed)
Completed Rx and placed in [facility] in box. 

## 2019-07-05 ENCOUNTER — Ambulatory Visit: Payer: PPO

## 2019-07-05 ENCOUNTER — Other Ambulatory Visit: Payer: Self-pay

## 2019-07-05 ENCOUNTER — Ambulatory Visit: Payer: PPO | Admitting: Radiation Oncology

## 2019-07-05 DIAGNOSIS — R5383 Other fatigue: Secondary | ICD-10-CM

## 2019-07-05 DIAGNOSIS — G4733 Obstructive sleep apnea (adult) (pediatric): Secondary | ICD-10-CM

## 2019-07-12 ENCOUNTER — Ambulatory Visit: Payer: PPO

## 2019-07-13 ENCOUNTER — Telehealth: Payer: Self-pay | Admitting: Internal Medicine

## 2019-07-13 NOTE — Telephone Encounter (Signed)
atc patient, unable to reach, left message to call back  

## 2019-07-14 NOTE — Telephone Encounter (Signed)
This is an [facility] patient and I assume the sleep study was done there. I do not yet have access.  Please check with TD to see where that issue stands.

## 2019-07-14 NOTE — Telephone Encounter (Signed)
CY and TD advise me that the issue is being taken care of today. Will close out this message. Nothing further needed at this time.

## 2019-07-14 NOTE — Telephone Encounter (Signed)
Pt returning call.  [phone].

## 2019-07-14 NOTE — Telephone Encounter (Signed)
Left message for patient to call back.   Dr. Annamaria Boots, can you please advise on his sleep study results when they are available? Thanks!

## 2019-07-15 ENCOUNTER — Encounter: Payer: Self-pay | Admitting: Urology

## 2019-07-17 ENCOUNTER — Telehealth: Payer: Self-pay | Admitting: Internal Medicine

## 2019-07-17 DIAGNOSIS — G4733 Obstructive sleep apnea (adult) (pediatric): Secondary | ICD-10-CM

## 2019-07-17 NOTE — Telephone Encounter (Signed)
Does Patrice or Kathlee Nations have the HST report? It was given to Dr. Ander Slade to review on 07/06/19

## 2019-07-17 NOTE — Telephone Encounter (Signed)
PCCs, can you help with this? According to last telephone note, this pt is an [facility] pt and CY is having trouble accessing the results for their HST performed on 07/05/2019. Please advise if there is any new updates. Thank you.

## 2019-07-18 NOTE — Telephone Encounter (Signed)
Preston Thomas since you are working with Dr. Ander Slade today would you call this patient about his Sleep study results. It was in that stack between you and Dr. Ander Slade

## 2019-07-18 NOTE — Telephone Encounter (Signed)
I don't have the study, Dr. Jenetta Downer was reading studies earlier this afternoon, so he may have just read it.

## 2019-07-18 NOTE — Telephone Encounter (Signed)
Pt called back regarding HST results - I do not have this report - pt can be reached at [phone]

## 2019-07-18 NOTE — Telephone Encounter (Signed)
I am sorry I didn't get to call the patient. Can you please call patient. It is in the stack of his readings.

## 2019-07-19 NOTE — Telephone Encounter (Signed)
Called pt and advised message from the provider. Pt understood and verbalized understanding. Nothing further is needed.   CPAP ordered.   Patrice if you have sleep report, pt would like a copy sent to him in the mail. If not, please send back to triage. It is not scanned in yet.

## 2019-07-19 NOTE — Telephone Encounter (Signed)
I have sleep study.  Will make copy of sleep study and place in mail for Patient.  Nothing further at this time.

## 2019-07-19 NOTE — Telephone Encounter (Signed)
ATC Patient. LM to call back for home sleep results.  Dr. Ander Slade has reviewed the home sleep test this showed sever obstructive sleep apnea.   Recommendations   Treatment options are CPAP with the settings auto 5 to 20.    Weight loss measures .   Advise against driving while sleepy & against medication with sedative side effects.    Make appointment for 3 months for compliance with download with Dr. Ander Slade.

## 2019-07-26 ENCOUNTER — Encounter: Payer: Self-pay | Admitting: Radiation Oncology

## 2019-07-26 ENCOUNTER — Ambulatory Visit
Admission: RE | Admit: 2019-07-26 | Discharge: 2019-07-26 | Disposition: A | Discharge status: Home / Self Care | Payer: PPO | Source: Ambulatory Visit | Attending: Radiation Oncology | Admitting: Radiation Oncology

## 2019-07-26 ENCOUNTER — Other Ambulatory Visit: Payer: Self-pay | Admitting: *Deleted

## 2019-07-26 ENCOUNTER — Other Ambulatory Visit: Payer: Self-pay

## 2019-07-26 ENCOUNTER — Ambulatory Visit: Payer: PPO

## 2019-07-26 VITALS — BP 128/86 | HR 91 | Temp 96.8°F | Resp 18 | Wt 202.5 lb

## 2019-07-26 DIAGNOSIS — K219 Gastro-esophageal reflux disease without esophagitis: Secondary | ICD-10-CM | POA: Insufficient documentation

## 2019-07-26 DIAGNOSIS — C61 Malignant neoplasm of prostate: Secondary | ICD-10-CM | POA: Insufficient documentation

## 2019-07-26 DIAGNOSIS — Z87891 Personal history of nicotine dependence: Secondary | ICD-10-CM | POA: Insufficient documentation

## 2019-07-26 DIAGNOSIS — Z923 Personal history of irradiation: Secondary | ICD-10-CM | POA: Insufficient documentation

## 2019-07-26 DIAGNOSIS — K5900 Constipation, unspecified: Secondary | ICD-10-CM | POA: Diagnosis not present

## 2019-07-26 DIAGNOSIS — Z510 Encounter for antineoplastic radiation therapy: Secondary | ICD-10-CM | POA: Insufficient documentation

## 2019-07-26 NOTE — Progress Notes (Signed)
Radiation Oncology Follow up Note  Name: Preston Thomas   Date:   07/26/2019 MRN:  [id number] DOB: 01-02-1951    This 68 y.o. male presents to the clinic today for 1 month follow-up status post I-125 interstitial implant for boost in a patient prior treated with external beam treatment to his prostate and pelvic nodes for Gleason 8 (3+5) adenocarcinoma the prostate presenting with a PSA of 12.7.  REFERRING PROVIDER: Pleas [name], NP  HPI: Patient is a 68 year old male now out 1 month having completed I-125 interstitial implant for boost in patient with stage IIb Gleason 8 (3+5) adenocarcinoma presenting with a PSA of 12.7.  Seen today in routine follow-up he is doing well he states his bowel movements tends towards constipation.  He does have nocturia x3 no specific urgency or frequency.  He had CT scan for quality assurance on seed implant today..  COMPLICATIONS OF TREATMENT: none  FOLLOW UP COMPLIANCE: keeps appointments   PHYSICAL EXAM:  BP 128/86 (BP Location: Left Arm, Patient Position: Sitting)   Pulse 91   Temp (!) 96.8 F (36 C) (Tympanic)   Resp 18   Wt 202 lb 8 oz (91.9 kg)   BMI 39.55 kg/m  Well-developed well-nourished patient in NAD. HEENT reveals PERLA, EOMI, discs not visualized.  Oral cavity is clear. No oral mucosal lesions are identified. Neck is clear without evidence of cervical or supraclavicular adenopathy. Lungs are clear to A&P. Cardiac examination is essentially unremarkable with regular rate and rhythm without murmur rub or thrill. Abdomen is benign with no organomegaly or masses noted. Motor sensory and DTR levels are equal and symmetric in the upper and lower extremities. Cranial nerves II through XII are grossly intact. Proprioception is intact. No peripheral adenopathy or edema is identified. No motor or sensory levels are noted. Crude visual fields are within normal range.  RADIOLOGY RESULTS: CT scan for quality assurance performed showing  on initial review excellent placement of sources  PLAN: Present time patient is doing well low side effect profile from both external beam treatment as well as I-125 interstitial implant for boost.  I am pleased with his overall progress.  I have asked to see him back in 3 months with a PSA prior to that visit.  Patient knows to call sooner with any concerns at any time.  I would like to take this opportunity to thank you for allowing me to participate in the care of your patient.Noreene Filbert, MD

## 2019-07-28 DIAGNOSIS — Z87891 Personal history of nicotine dependence: Secondary | ICD-10-CM | POA: Diagnosis not present

## 2019-07-28 DIAGNOSIS — Z510 Encounter for antineoplastic radiation therapy: Secondary | ICD-10-CM | POA: Diagnosis not present

## 2019-07-28 DIAGNOSIS — C61 Malignant neoplasm of prostate: Secondary | ICD-10-CM | POA: Diagnosis not present

## 2019-07-28 DIAGNOSIS — K219 Gastro-esophageal reflux disease without esophagitis: Secondary | ICD-10-CM | POA: Diagnosis not present

## 2019-07-31 DIAGNOSIS — C61 Malignant neoplasm of prostate: Secondary | ICD-10-CM | POA: Diagnosis not present

## 2019-07-31 DIAGNOSIS — Z510 Encounter for antineoplastic radiation therapy: Secondary | ICD-10-CM | POA: Diagnosis not present

## 2019-08-02 ENCOUNTER — Ambulatory Visit: Payer: PPO

## 2019-08-04 DIAGNOSIS — G4733 Obstructive sleep apnea (adult) (pediatric): Secondary | ICD-10-CM | POA: Diagnosis not present

## 2019-08-16 ENCOUNTER — Telehealth: Payer: Self-pay | Admitting: Urology

## 2019-08-16 NOTE — Telephone Encounter (Signed)
NO PA REQUIRED FOR LUPRON/ELIGARD WITH [employer]

## 2019-08-21 DIAGNOSIS — G4733 Obstructive sleep apnea (adult) (pediatric): Secondary | ICD-10-CM | POA: Diagnosis not present

## 2019-08-29 ENCOUNTER — Ambulatory Visit: Payer: PPO | Admitting: Urology

## 2019-08-31 ENCOUNTER — Encounter: Payer: Self-pay | Admitting: Urology

## 2019-08-31 ENCOUNTER — Other Ambulatory Visit: Payer: Self-pay

## 2019-08-31 ENCOUNTER — Ambulatory Visit (INDEPENDENT_AMBULATORY_CARE_PROVIDER_SITE_OTHER): Payer: PPO | Admitting: Urology

## 2019-08-31 VITALS — BP 146/87 | HR 89 | Ht 60.0 in | Wt 195.0 lb

## 2019-08-31 DIAGNOSIS — C61 Malignant neoplasm of prostate: Secondary | ICD-10-CM | POA: Diagnosis not present

## 2019-08-31 MED ORDER — LEUPROLIDE ACETATE (6 MONTH) 45 MG ~~LOC~~ KIT
45.0000 mg | PACK | Freq: Once | SUBCUTANEOUS | Status: AC
  Administered 2019-08-31: 45 mg via SUBCUTANEOUS

## 2019-08-31 MED ORDER — TAMSULOSIN HCL 0.4 MG PO CAPS: 0.4000 mg | ORAL_CAPSULE | Freq: Every day | ORAL | 1 refills | Status: DC

## 2019-08-31 NOTE — Progress Notes (Signed)
Eligard SubQ Injection   Due to Prostate Cancer patient is present today for a Eligard Injection.  Medication: Eligard 6 month Dose: 45 mg  Location: right  Lot: [id number] Exp: 02/2021  Patient tolerated well, no complications were noted  Performed by: Gordy Clement, CMA   Patient's next follow up was scheduled for 6 months. This appointment was scheduled using wheel and given to patient today along with reminder continue on Vitamin D 800-1000iu and Calium 1000-1200mg  daily while on Androgen Deprivation Therapy.

## 2019-08-31 NOTE — Patient Instructions (Signed)

## 2019-08-31 NOTE — Progress Notes (Signed)
08/31/2019 1:53 PM   Preston Thomas [birth year] [id number]  Referring provider: Pleas [name], NP [place],  [place] [address]  Chief Complaint  Patient presents with  . Prostate Cancer    HPI: 68 y.o. male with high risk prostate cancer treated with combined IMRT/brachytherapy+ADT.  I-125 interstitial implant completed 06/27/2019.  He had no significant postoperative problems.  He has noted urinary hesitancy, frequency and urgency which he states is improving.  He saw Dr. Baruch Gouty in late October.   PMH: Past Medical History:  Diagnosis Date  . COLONIC POLYPS, HX OF 08/31/2007   Qualifier: Diagnosis of  By: Silvio Pate MD, [facility]   . DIVERTICULITIS, HX OF 08/31/2007   Qualifier: Diagnosis of  By: Joesph Fillers CMA (AAMA), Ronny Bacon    . DIVERTICULOSIS, COLON 08/31/2007   Qualifier: Diagnosis of  By: Silvio Pate MD, [facility]   . Dyspnea    Related to Covid  . GERD (gastroesophageal reflux disease)   . History of kidney stones   . RENAL CALCULUS, HX OF 08/31/2007   Qualifier: Diagnosis of  By: Joesph Fillers CMA (AAMA), Natasha      Surgical History: Past Surgical History:  Procedure Laterality Date  . APPENDECTOMY  1987  . CHOLECYSTECTOMY  1987  . RADIOACTIVE SEED IMPLANT N/A 06/27/2019   Procedure: RADIOACTIVE SEED IMPLANT/BRACHYTHERAPY IMPLANT;  Surgeon: Abbie Sons, MD;  Location: [facility] ORS;  Service: Urology;  Laterality: N/A;    Home Medications:  Allergies as of 08/31/2019      Reactions   Penicillins    Passed out Did it involve swelling of the face/tongue/throat, SOB, or low BP? Yes Did it involve sudden or severe rash/hives, skin peeling, or any reaction on the inside of your mouth or nose? No Did you need to seek medical attention at a hospital or doctor's office? No When did it last happen?30 + years If all above answers are "NO", may proceed with cephalosporin use.      Medication List       Accurate as of August 31, 2019  1:53  PM. If you have any questions, ask your nurse or doctor.        atorvastatin 20 MG tablet Commonly known as: LIPITOR Take 1 tablet (20 mg total) by mouth daily. For cholesterol.   Dialyvite Vitamin D 5000 125 MCG (5000 UT) capsule Generic drug: Cholecalciferol Take 5,000 Units by mouth daily.   PriLOSEC 20 MG capsule Generic drug: omeprazole Take 20 mg by mouth daily.   SUMAtriptan 100 MG tablet Commonly known as: IMITREX Take 1 tablet by mouth at migraine onset. May repeat in 2 hours if headache persists or recurs. Do not exceed 100 mg in 24 hours.   vitamin B-12 500 MCG tablet Commonly known as: CYANOCOBALAMIN Take 500 mcg by mouth daily.   vitamin C 250 MG tablet Commonly known as: ASCORBIC ACID Take 250 mg by mouth daily.   Zinc 100 MG Tabs Take by mouth.       Allergies:  Allergies  Allergen Reactions  . Penicillins     Passed out Did it involve swelling of the face/tongue/throat, SOB, or low BP? Yes Did it involve sudden or severe rash/hives, skin peeling, or any reaction on the inside of your mouth or nose? No Did you need to seek medical attention at a hospital or doctor's office? No When did it last happen?30 + years If all above answers are "NO", may proceed with cephalosporin use.  Family History: Family History  Problem Relation Age of Onset  . Early death Father   . Kidney disease Father     Social History:  reports that he quit smoking about 34 years ago. His smoking use included cigarettes. He quit after 10.00 years of use. He has never used smokeless tobacco. He reports previous alcohol use. He reports that he does not use drugs.  ROS: UROLOGY Frequent Urination?: Yes Hard to postpone urination?: No Burning/pain with urination?: Yes Get up at night to urinate?: Yes Leakage of urine?: No Urine stream starts and stops?: Yes Trouble starting stream?: Yes Do you have to strain to urinate?: No Blood in urine?: No Urinary tract  infection?: No Sexually transmitted disease?: No Injury to kidneys or bladder?: No Painful intercourse?: No Weak stream?: Yes Erection problems?: No Penile pain?: No  Gastrointestinal Nausea?: No Vomiting?: No Indigestion/heartburn?: No Diarrhea?: No Constipation?: No  Constitutional Fever: No Night sweats?: No Weight loss?: No Fatigue?: Yes  Skin Skin rash/lesions?: No Itching?: No  Eyes Blurred vision?: No Double vision?: No  Ears/Nose/Throat Sore throat?: No Sinus problems?: No  Hematologic/Lymphatic Swollen glands?: No Easy bruising?: No  Cardiovascular Leg swelling?: No Chest pain?: No  Respiratory Cough?: No Shortness of breath?: No  Endocrine Excessive thirst?: No  Musculoskeletal Back pain?: No Joint pain?: No  Neurological Headaches?: No Dizziness?: No  Psychologic Depression?: No Anxiety?: No  Physical Exam: BP (!) 146/87 (BP Location: Left Arm, Patient Position: Sitting, Cuff Size: Large)   Pulse 89   Ht 5' (1.524 m)   Wt 195 lb (88.5 kg)   BMI 38.08 kg/m   Constitutional:  Alert and oriented, No acute distress. HEENT: [place] AT, moist mucus membranes.  Trachea midline, no masses. Cardiovascular: No clubbing, cyanosis, or edema. Respiratory: Normal respiratory effort, no increased work of breathing.   Assessment & Plan:    - T1c high risk prostate cancer Doing well status post interstitial implant.  He has a follow-up in radiation oncology February 2021 and will see him back in May 2021 for PSA and Eligard injection.  He received an Eligard injection today.  Rx tamsulosin sent to pharmacy for his lower urinary tract symptoms.  Return in about 6 months (around 02/19/2020) for Recheck, PSA.  Abbie Sons, [place] [address], [place], [place] [address] [phone]

## 2019-09-03 DIAGNOSIS — G4733 Obstructive sleep apnea (adult) (pediatric): Secondary | ICD-10-CM | POA: Diagnosis not present

## 2019-09-04 ENCOUNTER — Encounter: Payer: Self-pay | Admitting: Urology

## 2019-09-06 ENCOUNTER — Encounter: Payer: Self-pay | Admitting: Primary Care

## 2019-09-06 ENCOUNTER — Ambulatory Visit (INDEPENDENT_AMBULATORY_CARE_PROVIDER_SITE_OTHER): Payer: PPO | Admitting: Primary Care

## 2019-09-06 ENCOUNTER — Other Ambulatory Visit: Payer: Self-pay

## 2019-09-06 VITALS — BP 136/82 | HR 90 | Temp 98.0°F | Ht 60.0 in | Wt 211.8 lb

## 2019-09-06 DIAGNOSIS — R600 Localized edema: Secondary | ICD-10-CM

## 2019-09-06 DIAGNOSIS — R609 Edema, unspecified: Secondary | ICD-10-CM

## 2019-09-06 DIAGNOSIS — Z23 Encounter for immunization: Secondary | ICD-10-CM

## 2019-09-06 NOTE — Patient Instructions (Signed)
Continue to elevate your lower extremities. Apply ice and compression stockings daily as needed.   Please update me in one week if no improvement in your symptoms.   Stop by the lab prior to leaving today. I will notify you of your results once received.   It was a pleasure to see you today!

## 2019-09-06 NOTE — Assessment & Plan Note (Addendum)
L>R, Chronic for 6 months following ankle injury and initiation of Eligard injections per Urology, worse in the past week following recent Eligard injection.   Suspect side effect of Eligard (peripheral edema listed in < 12% per Up to Date).   Labs pending to rule out gout, infectious process, or possible fluid overload (CBC, BMP, BNP, uric acid).   Pt to update in 1 week. If no improvement, consider further work-up with possible ultrasound/x-ray.   Agree with plan, Pleas [name], NP

## 2019-09-06 NOTE — Progress Notes (Signed)
   Subjective:    Patient ID: Preston Thomas, male    DOB: 08-31-1951, 68 y.o.   MRN: [id number]  HPI  Preston Thomas is a 68 year old male with a history of prostate cancer s/p radiation therapy and seed implant, currently receiving Eligard injections, hyperlipidemia, migraines, OSA c/ CPAP and GERD who presents today with a chief complaint of left ankle edema.   He sprained his L ankle 6 months ago when he rolled his ankle when standing from a chair. He did have bruising, redness and swelling at the time, which resolved in 2-3 days with rest and ice.  He has had intermittent dependent L ankle edema ever since, usually relieved with rest and elevation. He works at desk 8 hours a day, with hourly standing/walking breaks. He has noticed an increase in the swelling over the last week. He denies decreased ROM, recent trauma, gait instability, skin changes. He does report mild pain at the L lateral malleolus, however, this is positional and with increased pressure to the site.    He does report a decrease in activity tolerance since recovering from Covid-19. He denies chest pain, shortness of breath.   Recent medication changes include Flomax, started on 08/31/19. He also received an Eligard injection on 08/31/19 per urology.   BP Readings from Last 3 Encounters:  09/06/19 136/82  08/31/19 (!) 146/87  07/26/19 128/86      Review of Systems  Constitutional: Negative for activity change.  Respiratory: Negative for shortness of breath.   Cardiovascular: Positive for leg swelling. Negative for chest pain.  Musculoskeletal: Positive for joint swelling. Negative for arthralgias and gait problem.  Skin: Negative for color change.  Neurological: Negative for weakness.       Objective:   Physical Exam Cardiovascular:     Rate and Rhythm: Normal rate and regular rhythm.     Pulses:          Dorsalis pedis pulses are 1+ on the right side and 1+ on the left side.       Posterior tibial  pulses are 1+ on the right side and 1+ on the left side.     Heart sounds: Normal heart sounds.  Pulmonary:     Effort: Pulmonary effort is normal.     Breath sounds: Normal breath sounds.  Musculoskeletal: Normal range of motion.     Right lower leg: Edema present.     Left lower leg: Edema (trace-pitting, L>R) present.  Skin:    General: Skin is warm and dry.  Neurological:     Mental Status: He is alert and oriented to person, place, and time.           Assessment & Plan:

## 2019-09-06 NOTE — Progress Notes (Signed)
Subjective:    Patient ID: Preston Thomas, male    DOB: 1950-10-03, 68 y.o.   MRN: [id number]  HPI  Preston Thomas is a 68 year old male with a history of hyperlipidemia, migraines, Covid-19 infection, prostate cancer with history of radiation therapy who presents today with a chief complaint of ankle edema.  He accidentally rolled his left ankle six months ago after falling from a chair. Symptoms resolved within 2-3 days.   Since then he's noticed intermittent swelling to the left ankle with improvement with elevation/rest. 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   Over the last one week his ankle edema has increased. He denies skin changes, decrease in ROM, acute trauma, shortness of breath, changes in activity level. Mild discomfort to left lateral malleolus if he hits the ankle. He started tamsulosin one week ago, also underwent his injection of Eligard last week per Urology.   Review of Systems  Musculoskeletal: Negative for arthralgias and myalgias.  Skin: Negative for color change.  Neurological: Negative for weakness and numbness.       Past Medical History:  Diagnosis Date  . COLONIC POLYPS, HX OF 08/31/2007   Qualifier: Diagnosis of  By: Silvio Pate MD, [facility]   . DIVERTICULITIS, HX OF 08/31/2007   Qualifier: Diagnosis of  By: Joesph Fillers CMA (AAMA), Ronny Bacon    . DIVERTICULOSIS, COLON 08/31/2007   Qualifier: Diagnosis of  By: Silvio Pate MD, [facility]   . Dyspnea    Related to Covid  . GERD (gastroesophageal reflux disease)   . History of kidney stones   . RENAL CALCULUS, HX OF 08/31/2007   Qualifier: Diagnosis of  By: Joesph Fillers CMA (AAMA), Natasha       Social History   Socioeconomic History  . Marital status: Married    Spouse name: Not on file  . Number of children: Not on file  . Years of education: Not on file  . Highest education level: Not on file  Occupational History  . Not on file  Social Needs  .  Financial resource strain: Not on file  . Food insecurity    Worry: Not on file    Inability: Not on file  . Transportation needs    Medical: Not on file    Non-medical: Not on file  Tobacco Use  . Smoking status: Former Smoker    Years: 10.00    Types: Cigarettes    Quit date: 09/28/1984    Years since quitting: 34.9  . Smokeless tobacco: Never Used  Substance and Sexual Activity  . Alcohol use: Not Currently  . Drug use: Never  . Sexual activity: Yes  Lifestyle  . Physical activity    Days per week: Not on file    Minutes per session: Not on file  . Stress: Not on file  Relationships  . Social Herbalist on phone: Not on file    Gets together: Not on file    Attends religious service: Not on file    Active member of club or organization: Not on file    Attends meetings of clubs or organizations: Not on file    Relationship status: Not on file  . Intimate partner violence    Fear of current or ex partner: Not on file    Emotionally abused: Not on file    Physically abused: Not on file    Forced  sexual activity: Not on file  Other Topics Concern  . Not on file  Social History Narrative  . Not on file    Past Surgical History:  Procedure Laterality Date  . APPENDECTOMY  1987  . CHOLECYSTECTOMY  1987  . RADIOACTIVE SEED IMPLANT N/A 06/27/2019   Procedure: RADIOACTIVE SEED IMPLANT/BRACHYTHERAPY IMPLANT;  Surgeon: Abbie Sons, MD;  Location: [facility] ORS;  Service: Urology;  Laterality: N/A;    Family History  Problem Relation Age of Onset  . Early death Father   . Kidney disease Father     Allergies  Allergen Reactions  . Penicillins     Passed out Did it involve swelling of the face/tongue/throat, SOB, or low BP? Yes Did it involve sudden or severe rash/hives, skin peeling, or any reaction on the inside of your mouth or nose? No Did you need to seek medical attention at a hospital or doctor's office? No When did it last happen?30 + years If  all above answers are "NO", may proceed with cephalosporin use.     Current Outpatient Medications on File Prior to Visit  Medication Sig Dispense Refill  . atorvastatin (LIPITOR) 20 MG tablet Take 1 tablet (20 mg total) by mouth daily. For cholesterol. 90 tablet 3  . Cholecalciferol (DIALYVITE VITAMIN D 5000) 125 MCG (5000 UT) capsule Take 5,000 Units by mouth daily.    [name] omeprazole (PRILOSEC) 20 MG capsule Take 20 mg by mouth daily.    . SUMAtriptan (IMITREX) 100 MG tablet Take 1 tablet by mouth at migraine onset. May repeat in 2 hours if headache persists or recurs. Do not exceed 100 mg in 24 hours. 10 tablet 0  . tamsulosin (FLOMAX) 0.4 MG CAPS capsule Take 1 capsule (0.4 mg total) by mouth daily. 30 capsule 1  . vitamin B-12 (CYANOCOBALAMIN) 500 MCG tablet Take 500 mcg by mouth daily.    . vitamin C (ASCORBIC ACID) 250 MG tablet Take 250 mg by mouth daily.    . Zinc 100 MG TABS Take by mouth.     No current facility-administered medications on file prior to visit.     BP 136/82   Pulse 90   Temp 98 F (36.7 C) (Temporal)   Ht 5' (1.524 m)   Wt 211 lb 12 oz (96 kg)   SpO2 95%   BMI 41.35 kg/m    Objective:   Physical Exam  Constitutional: He appears well-nourished.  Cardiovascular: Normal rate.  Pulses:      Dorsalis pedis pulses are 1+ on the right side and 1+ on the left side.       Posterior tibial pulses are 1+ on the right side and 1+ on the left side.  Bilateral edema with trace pitting, left slightly greater than right. Pulses palpable but difficult given edema.   Respiratory: Effort normal.  Skin: Skin is warm and dry. No erythema.           Assessment & Plan:

## 2019-09-07 DIAGNOSIS — Z23 Encounter for immunization: Secondary | ICD-10-CM | POA: Diagnosis not present

## 2019-09-07 DIAGNOSIS — R609 Edema, unspecified: Secondary | ICD-10-CM | POA: Diagnosis not present

## 2019-09-07 LAB — CBC WITH DIFFERENTIAL/PLATELET
Basophils Absolute: 0.1 10*3/uL (ref 0.0–0.1)
Basophils Relative: 0.8 % (ref 0.0–3.0)
Eosinophils Absolute: 0.3 10*3/uL (ref 0.0–0.7)
Eosinophils Relative: 2.9 % (ref 0.0–5.0)
HCT: 42.8 % (ref 39.0–52.0)
Hemoglobin: 14 g/dL (ref 13.0–17.0)
Lymphocytes Relative: 10.3 % — ABNORMAL LOW (ref 12.0–46.0)
Lymphs Abs: 1 10*3/uL (ref 0.7–4.0)
MCHC: 32.7 g/dL (ref 30.0–36.0)
MCV: 91.4 fl (ref 78.0–100.0)
Monocytes Absolute: 1 10*3/uL (ref 0.1–1.0)
Monocytes Relative: 10.2 % (ref 3.0–12.0)
Neutro Abs: 7.3 10*3/uL (ref 1.4–7.7)
Neutrophils Relative %: 75.8 % (ref 43.0–77.0)
Platelets: 318 10*3/uL (ref 150.0–400.0)
RBC: 4.69 Mil/uL (ref 4.22–5.81)
RDW: 13.6 % (ref 11.5–15.5)
WBC: 9.6 10*3/uL (ref 4.0–10.5)

## 2019-09-07 LAB — BASIC METABOLIC PANEL
BUN: 18 mg/dL (ref 6–23)
CO2: 29 mEq/L (ref 19–32)
Calcium: 9.2 mg/dL (ref 8.4–10.5)
Chloride: 106 mEq/L (ref 96–112)
Creatinine, Ser: 1.19 mg/dL (ref 0.40–1.50)
GFR: 61.15 mL/min (ref >60.00)
Glucose, Bld: 99 mg/dL (ref 70–99)
Potassium: 4.2 mEq/L (ref 3.5–5.1)
Sodium: 143 mEq/L (ref 135–145)

## 2019-09-07 LAB — URIC ACID: Uric Acid, Serum: 6.6 mg/dL (ref 4.0–7.8)

## 2019-09-07 LAB — BRAIN NATRIURETIC PEPTIDE: Pro B Natriuretic peptide (BNP): 41 pg/mL (ref 0.0–100.0)

## 2019-09-07 NOTE — Addendum Note (Signed)
Addended by: Jacqualin Combes on: 09/07/2019 08:19 AM   Modules accepted: Orders

## 2019-09-14 ENCOUNTER — Ambulatory Visit: Payer: PPO

## 2019-09-23 ENCOUNTER — Other Ambulatory Visit: Payer: Self-pay | Admitting: Urology

## 2019-10-04 DIAGNOSIS — G4733 Obstructive sleep apnea (adult) (pediatric): Secondary | ICD-10-CM | POA: Diagnosis not present

## 2019-10-21 ENCOUNTER — Other Ambulatory Visit: Payer: Self-pay | Admitting: Urology

## 2019-10-23 ENCOUNTER — Inpatient Hospital Stay: Payer: PPO | Attending: Radiation Oncology

## 2019-10-23 ENCOUNTER — Other Ambulatory Visit: Payer: Self-pay

## 2019-10-23 DIAGNOSIS — C61 Malignant neoplasm of prostate: Secondary | ICD-10-CM | POA: Diagnosis not present

## 2019-10-23 LAB — PSA: Prostatic Specific Antigen: 0.29 ng/mL (ref 0.00–4.00)

## 2019-10-30 ENCOUNTER — Other Ambulatory Visit: Payer: Self-pay

## 2019-10-30 ENCOUNTER — Ambulatory Visit
Admission: RE | Admit: 2019-10-30 | Discharge: 2019-10-30 | Disposition: A | Discharge status: Home / Self Care | Payer: PPO | Source: Ambulatory Visit | Attending: Radiation Oncology | Admitting: Radiation Oncology

## 2019-10-30 VITALS — BP 133/78 | HR 96 | Temp 98.0°F | Resp 16 | Wt 217.5 lb

## 2019-10-30 DIAGNOSIS — Z923 Personal history of irradiation: Secondary | ICD-10-CM | POA: Insufficient documentation

## 2019-10-30 DIAGNOSIS — C61 Malignant neoplasm of prostate: Secondary | ICD-10-CM | POA: Diagnosis not present

## 2019-10-30 NOTE — Progress Notes (Signed)
Radiation Oncology Follow up Note  Name: Preston Thomas   Date:   10/30/2019 MRN:  [id number] DOB: August 14, 1951    This 69 y.o. male presents to the clinic today for 4 months out status post I-125 interstitial implant for boost and patient treated with both external beam treatment to his prostate and pelvic nodes for Gleason 8 (3+5) adenocarcinoma presenting with a PSA of 12.7..  REFERRING PROVIDER: Pleas [name], NP  HPI: Patient is a 69 year old male now out 4 months having completed I-125 interstitial implant for boost and patient is also on androgen deprivation therapy and received radiation therapy to his prostate and pelvic nodes for stage IIb disease.  He is seen today in routine follow-up is doing well he specifically denies diarrhea dysuria or any other GI/GU complaints.  He continues on androgen deprivation therapy.  His most recent PSA is 0.29.[name]  COMPLICATIONS OF TREATMENT: none  FOLLOW UP COMPLIANCE: keeps appointments   PHYSICAL EXAM:  BP 133/78   Pulse 96   Temp 98 F (36.7 C) (Tympanic)   Resp 16   Wt 217 lb 8 oz (98.7 kg)   BMI 42.48 kg/m  Well-developed well-nourished patient in NAD. HEENT reveals PERLA, EOMI, discs not visualized.  Oral cavity is clear. No oral mucosal lesions are identified. Neck is clear without evidence of cervical or supraclavicular adenopathy. Lungs are clear to A&P. Cardiac examination is essentially unremarkable with regular rate and rhythm without murmur rub or thrill. Abdomen is benign with no organomegaly or masses noted. Motor sensory and DTR levels are equal and symmetric in the upper and lower extremities. Cranial nerves II through XII are grossly intact. Proprioception is intact. No peripheral adenopathy or edema is identified. No motor or sensory levels are noted. Crude visual fields are within normal range.  RADIOLOGY RESULTS: No current films for review  PLAN: Present time patient is an excellent response biochemically from  his radiation therapy and androgen deprivation therapy.  He will continue androgen deprivation therapy through urology.  I am pleased with his overall progress.  I have asked to see him back in 6 months for follow-up with a PSA prior to that appointment if is not been already performed recently.  Patient comprehends my treatment plan well well and recommendations.  I would like to take this opportunity to thank you for allowing me to participate in the care of your patient.Noreene Filbert, MD

## 2019-10-30 NOTE — Progress Notes (Signed)
Patient here for  Follow up no complaints or concerns today

## 2019-11-19 ENCOUNTER — Other Ambulatory Visit: Payer: Self-pay | Admitting: Urology

## 2019-11-21 ENCOUNTER — Telehealth: Payer: Self-pay | Admitting: Pulmonary Disease

## 2019-11-21 NOTE — Telephone Encounter (Signed)
Called and spoke with Patient.  Patient is needing 3 month cpap new start follow up for insurance compliance. Patient has and uses my chart. Patient is scheduled my chart visit with Beth, NP, 11/24/19 at 1100. Nothing further at this time.

## 2019-11-24 ENCOUNTER — Telehealth: Payer: Self-pay | Admitting: Primary Care

## 2019-11-24 ENCOUNTER — Encounter: Payer: Self-pay | Admitting: Primary Care

## 2019-11-24 ENCOUNTER — Telehealth (INDEPENDENT_AMBULATORY_CARE_PROVIDER_SITE_OTHER): Payer: PPO | Admitting: Primary Care

## 2019-11-24 DIAGNOSIS — R0602 Shortness of breath: Secondary | ICD-10-CM | POA: Diagnosis not present

## 2019-11-24 DIAGNOSIS — U071 COVID-19: Secondary | ICD-10-CM

## 2019-11-24 NOTE — Patient Instructions (Addendum)
Recommendation: Continue to wear CPAP every night 4-6 hours or more Do not drive if experiencing excessive daytime fatigue or somnolence  Stay as active possible   Orders: CT chest wo contrast in 3 months re: post covid  PFTs in 3 months re: dyspnea   Follow: 3 months with Dr. Ander Slade - new patient (post covid 1 year fu and OSA)

## 2019-11-24 NOTE — Telephone Encounter (Signed)
LMTCB

## 2019-11-24 NOTE — Progress Notes (Signed)
Virtual Visit via Video Note  I connected with Preston Thomas on 11/24/19 at 11:00 AM EST by a video enabled telemedicine application and verified that I am speaking with the correct person using two identifiers.  Location: Patient: Home Provider: Office  I discussed the limitations of evaluation and management by telemedicine and the availability of in person appointments. The patient expressed understanding and agreed to proceed.  History of Present Illness: 69 year old male, former smoker quit in 1980. PMH significant for OSA, pneumonia d.t COVID-19, GERD, prostate cancer, HLD. Previous patient of Dr. Alva Garnet, originally seen on 05/30/19. HST showed severe OSA, results reviewed by Dr. Ander Slade and recommended auto CPAP 5-20cm h20.   11/24/2019 Patient contacted today for 3-4 month follow-up OSA/new CPAP start. He is doing well, no issues with mask. Sleeping well. Wakes up to use the restroom once. Previously he was getting up multiple times. He feels a lot better than he did before. He is getting 7 hours of sleep and feels refreshed in the morning. He gets winded on exertion since having COVID in September 2020. He used to walk 2 miles a day. No chest tightness, wheezing or cough.    Airview download: 30/30 days; 100% > 4 hours Average hours 7 hr 20 mins Pressure 5-20cm h20 (15.6 cm h20 - 95%) AHI 2.9   Observations/Objective:  - Appears well - No shortness of breath when speaking  Assessment and Plan:  OSA: - Patient compliant with CPAP and reports benefit from use - No issues with mask fit or pressure setting - Pressure 5-20cm h20; AHI 2.9 - no changes  - Continues wearing CPAP every night for 4-6 hours or more  Hx Covid-19 pneumonia: - Reports persistent dyspnea on exertion since covid-19 dx - CXR 05/30/19 showed persistent diffuse bilateral interstitial/patchy pulmonary opacities  - Recommending CT chest wo contrast and PFTs in 3 months  Follow Up Instructions:  -  3 months with Dr. Ander Slade- new patient (1 year post-covid fu; OSA)   I discussed the assessment and treatment plan with the patient. The patient was provided an opportunity to ask questions and all were answered. The patient agreed with the plan and demonstrated an understanding of the instructions.   The patient was advised to call back or seek an in-person evaluation if the symptoms worsen or if the condition fails to improve as anticipated.  I provided 20 minutes of non-face-to-face time during this encounter.   Martyn Ehrich, NP

## 2019-11-27 NOTE — Telephone Encounter (Signed)
Called and spoke to pt. Pt states he was calling Mandi back about a PFT and OV needing scheduled. Appt scheduled for PFT and COVID test for May. Pt aware to call back in April to schedule OV with Dr. Ander Slade. Pt verbalized understanding and denied any further questions or concerns at this time.

## 2019-12-05 ENCOUNTER — Telehealth: Payer: PPO | Admitting: Nurse Practitioner

## 2019-12-05 DIAGNOSIS — H60503 Unspecified acute noninfective otitis externa, bilateral: Secondary | ICD-10-CM | POA: Diagnosis not present

## 2019-12-05 MED ORDER — TOBRAMYCIN-DEXAMETHASONE 0.3-0.1 % OP SUSP: OPHTHALMIC | 0 refills | Status: DC

## 2019-12-05 NOTE — Progress Notes (Signed)
E Visit for Swimmer's Ear  We are sorry that you are not feeling well. Here is how we plan to help!  Based on what you have shared with me it looks like you have an ear canal infection. Your ear canal is a tube that goes from the opening of the ear to the eardrum.  When water stays in your ear canal, germs can grow.  This is a painful condition that often happens to children and swimmers of all ages.  It is not contagious and oral antibiotics are not required to treat uncomplicated swimmer's ear.  The usual symptoms include: Itching inside the ear, Redness or a sense of swelling in the ear, Pain when the ear is tugged on when pressure is placed on the ear, Pus draining from the infected ear. and I have prescribed: Tobramycin 0.3% and dexamethasone 0.1% opthmalmic suspension two drops in affected ears four times a day for 7 days  You may also benefit from using flonase Nasal spray OTC to help relieve any pressure you may have behind your ear drum.    Your symptoms should improve over the next 3 days and should resolve in about 7 days.  HOME CARE:   Wash your hands frequently.  Do not place the tip of the bottle on your ear or touch it with your fingers.  You can take Acetominophen 650 mg every 4-6 hours as needed for pain.  If pain is severe or moderate, you can apply a heating pad (set on low) or hot water bottle (wrapped in a towel) to outer ear for 20 minutes.  This will also increase drainage.  Avoid ear plugs  Do not use Q-tips  After showers, help the water run out by tilting your head to one side.  GET HELP RIGHT AWAY IF:   Fever is over 102.2 degrees.  You develop progressive ear pain or hearing loss.  Ear symptoms persist longer than 3 days after treatment.  MAKE SURE YOU:   Understand these instructions.  Will watch your condition.  Will get help right away if you are not doing well or get worse.  TO PREVENT SWIMMER'S EAR:  Use a bathing cap or custom fitted  swim molds to keep your ears dry.  Towel off after swimming to dry your ears.  Tilt your head or pull your earlobes to allow the water to escape your ear canal.  If there is still water in your ears, consider using a hairdryer on the lowest setting.  Thank you for choosing an e-visit. Your e-visit answers were reviewed by a board certified advanced clinical practitioner to complete your personal care plan. Depending upon the condition, your plan could have included both over the counter or prescription medications. Please review your pharmacy choice. Be sure that the pharmacy you have chosen is open so that you can pick up your prescription now.  If there is a problem you may message your provider in [place] to have the prescription routed to another pharmacy. Your safety is important to [place]. If you have drug allergies check your prescription carefully.  For the next 24 hours, you can use MyChart to ask questions about today's visit, request a non-urgent call back, or ask for a work or school excuse from your e-visit provider. You will get an email in the next two days asking about your experience. I hope that your e-visit has been valuable and will speed your recovery.    5-10 minutes spent reviewing and documenting in  chart.

## 2019-12-13 DIAGNOSIS — G4733 Obstructive sleep apnea (adult) (pediatric): Secondary | ICD-10-CM | POA: Diagnosis not present

## 2019-12-27 DIAGNOSIS — G4733 Obstructive sleep apnea (adult) (pediatric): Secondary | ICD-10-CM | POA: Diagnosis not present

## 2020-01-13 DIAGNOSIS — G4733 Obstructive sleep apnea (adult) (pediatric): Secondary | ICD-10-CM | POA: Diagnosis not present

## 2020-01-30 DIAGNOSIS — G4733 Obstructive sleep apnea (adult) (pediatric): Secondary | ICD-10-CM | POA: Diagnosis not present

## 2020-02-06 ENCOUNTER — Other Ambulatory Visit (HOSPITAL_COMMUNITY): Payer: PPO

## 2020-02-12 ENCOUNTER — Other Ambulatory Visit (HOSPITAL_COMMUNITY)
Admission: RE | Admit: 2020-02-12 | Discharge: 2020-02-12 | Disposition: A | Discharge status: Home / Self Care | Payer: PPO | Source: Ambulatory Visit | Attending: Primary Care | Admitting: Primary Care

## 2020-02-12 DIAGNOSIS — Z20822 Contact with and (suspected) exposure to covid-19: Secondary | ICD-10-CM | POA: Diagnosis not present

## 2020-02-12 DIAGNOSIS — Z01812 Encounter for preprocedural laboratory examination: Secondary | ICD-10-CM | POA: Insufficient documentation

## 2020-02-12 DIAGNOSIS — G4733 Obstructive sleep apnea (adult) (pediatric): Secondary | ICD-10-CM | POA: Diagnosis not present

## 2020-02-12 LAB — SARS CORONAVIRUS 2 (TAT 6-24 HRS): SARS Coronavirus 2: NEGATIVE

## 2020-02-13 ENCOUNTER — Other Ambulatory Visit: Payer: Self-pay | Admitting: Family Medicine

## 2020-02-13 DIAGNOSIS — C61 Malignant neoplasm of prostate: Secondary | ICD-10-CM

## 2020-02-14 ENCOUNTER — Ambulatory Visit
Admission: RE | Admit: 2020-02-14 | Discharge: 2020-02-14 | Disposition: A | Discharge status: Home / Self Care | Payer: PPO | Source: Ambulatory Visit | Attending: Primary Care | Admitting: Primary Care

## 2020-02-14 ENCOUNTER — Other Ambulatory Visit: Payer: Self-pay

## 2020-02-14 DIAGNOSIS — R0602 Shortness of breath: Secondary | ICD-10-CM

## 2020-02-15 ENCOUNTER — Ambulatory Visit (INDEPENDENT_AMBULATORY_CARE_PROVIDER_SITE_OTHER): Payer: PPO | Admitting: Pulmonary Disease

## 2020-02-15 DIAGNOSIS — R0602 Shortness of breath: Secondary | ICD-10-CM | POA: Diagnosis not present

## 2020-02-15 DIAGNOSIS — U071 COVID-19: Secondary | ICD-10-CM

## 2020-02-15 LAB — PULMONARY FUNCTION TEST
DL/VA % pred: 122 %
DL/VA: 5.3 ml/min/mmHg/L
DLCO cor % pred: 102 %
DLCO cor: 19.53 ml/min/mmHg
DLCO unc % pred: 102 %
DLCO unc: 19.53 ml/min/mmHg
FEF 25-75 Post: 2.42 L/sec
FEF 25-75 Pre: 3.2 L/sec
FEF2575-%Change-Post: -24 %
FEF2575-%Pred-Post: 134 %
FEF2575-%Pred-Pre: 176 %
FEV1-%Change-Post: -6 %
FEV1-%Pred-Post: 98 %
FEV1-%Pred-Pre: 104 %
FEV1-Post: 2.19 L
FEV1-Pre: 2.33 L
FEV1FVC-%Change-Post: 0 %
FEV1FVC-%Pred-Pre: 116 %
FEV6-%Change-Post: -6 %
FEV6-%Pred-Post: 89 %
FEV6-%Pred-Pre: 95 %
FEV6-Post: 2.51 L
FEV6-Pre: 2.69 L
FEV6FVC-%Pred-Post: 107 %
FEV6FVC-%Pred-Pre: 107 %
FVC-%Change-Post: -6 %
FVC-%Pred-Post: 83 %
FVC-%Pred-Pre: 89 %
FVC-Post: 2.51 L
FVC-Pre: 2.69 L
Post FEV1/FVC ratio: 87 %
Post FEV6/FVC ratio: 100 %
Pre FEV1/FVC ratio: 87 %
Pre FEV6/FVC Ratio: 100 %
RV % pred: 92 %
RV: 1.68 L
TLC % pred: 85 %
TLC: 4.27 L

## 2020-02-15 NOTE — Progress Notes (Signed)
Full PFT performed today. °

## 2020-02-19 ENCOUNTER — Other Ambulatory Visit: Payer: Self-pay

## 2020-02-19 ENCOUNTER — Other Ambulatory Visit: Payer: PPO

## 2020-02-19 DIAGNOSIS — C61 Malignant neoplasm of prostate: Secondary | ICD-10-CM | POA: Diagnosis not present

## 2020-02-20 ENCOUNTER — Other Ambulatory Visit: Payer: PPO

## 2020-02-20 ENCOUNTER — Encounter: Payer: Self-pay | Admitting: Urology

## 2020-02-20 ENCOUNTER — Ambulatory Visit: Payer: PPO | Admitting: Urology

## 2020-02-20 VITALS — BP 127/80 | HR 88 | Ht 59.0 in | Wt 221.0 lb

## 2020-02-20 DIAGNOSIS — C61 Malignant neoplasm of prostate: Secondary | ICD-10-CM | POA: Diagnosis not present

## 2020-02-20 LAB — PSA: Prostate Specific Ag, Serum: 0.3 ng/mL (ref 0.0–4.0)

## 2020-02-20 MED ORDER — LEUPROLIDE ACETATE (6 MONTH) 45 MG ~~LOC~~ KIT
45.0000 mg | PACK | Freq: Once | SUBCUTANEOUS | Status: AC
  Administered 2020-02-20: 45 mg via SUBCUTANEOUS

## 2020-02-20 NOTE — Progress Notes (Signed)
  NO PA REQUIRED FOR LUPRON/ELIGARD WITH [employer]  Eligard SubQ Injection   Due to Prostate Cancer patient is present today for a Eligard Injection.  Medication: Eligard 6 month Dose: 45mg   Location: right  Lot: [id number]   Exp: 09/2021   Patient tolerated well, no complications were noted  Performed by: Gaspar Cola CMA   Per Dr. Bernardo Heater patient is to continue therapy . Patient's next follow up was scheduled for 07/2020 This appointment was scheduled using wheel and given to patient today along with reminder continue on Vitamin D 800-1000iu and Calium 1000-1200mg  daily while on Androgen Deprivation Therapy.  PA approval dates:

## 2020-02-20 NOTE — Progress Notes (Signed)
02/20/2020 10:28 AM   Preston Thomas 11-Nov-1950 [id number]  Referring provider: Pleas [name], NP [place],  [place] [address]  Chief Complaint  Patient presents with  . Prostate Cancer  . Follow-up    Urologic history: 1. cT1c prostate cancer-high risk -Biopsy 10/2018, PSA 12.7, 28 g gland -10/12 cores positive Gleason 3+3 to 4+5 -CT/bone scan negative for metastatic disease -tx IMRT + brachytherapy + ADT; brachytherapy 05/2019  HPI: 69 y.o. male presents for prostate cancer follow-up  -Mild to moderate LUTS improving on tamsulosin -No dysuria, gross hematuria -No flank, abdominal, pelvic pain -Intermittent edema secondary to leuprolide -Last leuprolide 08/2019 -PSA 5/24 0.3    PMH: Past Medical History:  Diagnosis Date  . COLONIC POLYPS, HX OF 08/31/2007   Qualifier: Diagnosis of  By: Silvio Pate MD, [facility]   . DIVERTICULITIS, HX OF 08/31/2007   Qualifier: Diagnosis of  By: Joesph Fillers CMA (AAMA), Ronny Bacon    . DIVERTICULOSIS, COLON 08/31/2007   Qualifier: Diagnosis of  By: Silvio Pate MD, [facility]   . Dyspnea    Related to Covid  . GERD (gastroesophageal reflux disease)   . History of kidney stones   . RENAL CALCULUS, HX OF 08/31/2007   Qualifier: Diagnosis of  By: Joesph Fillers CMA (AAMA), Natasha      Surgical History: Past Surgical History:  Procedure Laterality Date  . APPENDECTOMY  1987  . CHOLECYSTECTOMY  1987  . RADIOACTIVE SEED IMPLANT N/A 06/27/2019   Procedure: RADIOACTIVE SEED IMPLANT/BRACHYTHERAPY IMPLANT;  Surgeon: Abbie Sons, MD;  Location: [facility] ORS;  Service: Urology;  Laterality: N/A;    Home Medications:  Allergies as of 02/20/2020      Reactions   Penicillins    Passed out Did it involve swelling of the face/tongue/throat, SOB, or low BP? Yes Did it involve sudden or severe rash/hives, skin peeling, or any reaction on the inside of your mouth or nose? No Did you need to seek medical attention at a hospital or  doctor's office? No When did it last happen?30 + years If all above answers are "NO", may proceed with cephalosporin use.      Medication List       Accurate as of Feb 20, 2020 10:28 AM. If you have any questions, ask your nurse or doctor.        atorvastatin 20 MG tablet Commonly known as: LIPITOR Take 1 tablet (20 mg total) by mouth daily. For cholesterol.   Dialyvite Vitamin D 5000 125 MCG (5000 UT) capsule Generic drug: Cholecalciferol Take 5,000 Units by mouth daily.   PriLOSEC 20 MG capsule Generic drug: omeprazole Take 20 mg by mouth daily.   SUMAtriptan 100 MG tablet Commonly known as: IMITREX Take 1 tablet by mouth at migraine onset. May repeat in 2 hours if headache persists or recurs. Do not exceed 100 mg in 24 hours.   tamsulosin 0.4 MG Caps capsule Commonly known as: FLOMAX TAKE 1 CAPSULE BY MOUTH EVERY DAY   tobramycin-dexamethasone ophthalmic solution Commonly known as: TobraDex 4 drops in affected ear BID   vitamin B-12 500 MCG tablet Commonly known as: CYANOCOBALAMIN Take 500 mcg by mouth daily.   vitamin C 250 MG tablet Commonly known as: ASCORBIC ACID Take 250 mg by mouth daily.   Zinc 100 MG Tabs Take by mouth.       Allergies:  Allergies  Allergen Reactions  . Penicillins     Passed out Did it involve swelling of the  face/tongue/throat, SOB, or low BP? Yes Did it involve sudden or severe rash/hives, skin peeling, or any reaction on the inside of your mouth or nose? No Did you need to seek medical attention at a hospital or doctor's office? No When did it last happen?30 + years If all above answers are "NO", may proceed with cephalosporin use.     Family History: Family History  Problem Relation Age of Onset  . Early death Father   . Kidney disease Father     Social History:  reports that he quit smoking about 35 years ago. His smoking use included cigarettes. He quit after 10.00 years of use. He has never used  smokeless tobacco. He reports previous alcohol use. He reports that he does not use drugs.   Physical Exam: BP 127/80   Pulse 88   Ht 4\' 11"  (1.499 m)   Wt 221 lb (100.2 kg)   BMI 44.64 kg/m   Constitutional:  Alert and oriented, No acute distress. HEENT: [place] AT, moist mucus membranes.  Trachea midline, no masses. Cardiovascular: No clubbing, cyanosis, or edema. Respiratory: Normal respiratory effort, no increased work of breathing. Skin: No rashes, bruises or suspicious lesions. Neurologic: Grossly intact, no focal deficits, moving all 4 extremities. Psychiatric: Normal mood and affect.   Assessment & Plan:    1. Prostate cancer -Doing well, PSA low and stable -Received leuprolide today -Radiation oncology follow-up scheduled 04/2020 -Follow-up with me 10/2020   Abbie Sons, [facility] [address], [place], [place] [address] [phone]

## 2020-02-21 ENCOUNTER — Ambulatory Visit: Payer: PPO | Admitting: Pulmonary Disease

## 2020-02-21 ENCOUNTER — Encounter: Payer: Self-pay | Admitting: Pulmonary Disease

## 2020-02-21 ENCOUNTER — Other Ambulatory Visit: Payer: Self-pay

## 2020-02-21 VITALS — BP 118/76 | HR 74 | Ht 59.0 in | Wt 220.4 lb

## 2020-02-21 DIAGNOSIS — G4733 Obstructive sleep apnea (adult) (pediatric): Secondary | ICD-10-CM | POA: Diagnosis not present

## 2020-02-21 DIAGNOSIS — J1282 Pneumonia due to coronavirus disease 2019: Secondary | ICD-10-CM

## 2020-02-21 DIAGNOSIS — R0602 Shortness of breath: Secondary | ICD-10-CM | POA: Diagnosis not present

## 2020-02-21 DIAGNOSIS — U071 COVID-19: Secondary | ICD-10-CM

## 2020-02-21 DIAGNOSIS — Z8616 Personal history of COVID-19: Secondary | ICD-10-CM

## 2020-02-21 DIAGNOSIS — C61 Malignant neoplasm of prostate: Secondary | ICD-10-CM

## 2020-02-21 HISTORY — DX: Shortness of breath: R06.02

## 2020-02-21 NOTE — Patient Instructions (Addendum)
You were seen today by Lauraine Rinne, NP  for:   1. Pneumonia due to COVID-19 virus 2. History of COVID-19  Pulmonary function test today is reassuring  Glad that you are slowly doing better  There is some scarring on your CT from the Covid pneumonia that you had last year  3. Shortness of breath  As discussed today I believe your breathing test is reassuring.  I believe his shortness of breath may likely be due to your oncology medications as well as just sedentary lifestyle.  Slowly and consistently lets increase physical activity as able  Notify our office if symptoms are worsening  4. Prostate cancer [facility])  Continue follow-up with oncology  5. OSA   We recommend that you continue using your CPAP daily >>>Keep up the hard work using your device >>> Goal should be wearing this for the entire night that you are sleeping, at least 4 to 6 hours  Remember:  . Do not drive or operate heavy machinery if tired or drowsy.  . Please notify the supply company and office if you are unable to use your device regularly due to missing supplies or machine being broken.  . Work on maintaining a healthy weight and following your recommended nutrition plan  . Maintain proper daily exercise and movement  . Maintaining proper use of your device can also help improve management of other chronic illnesses such as: Blood pressure, blood sugars, and weight management.   BiPAP/ CPAP Cleaning:  >>>Clean weekly, with Dawn soap, and bottle brush.  Set up to air dry. >>> Wipe mask out daily with wet wipe or towelette    Follow Up:    Return in about 6 months (around 08/23/2020), or if symptoms worsen or fail to improve, for Follow up with Dr. Ander Slade, [facility].   Please do your part to reduce the spread of COVID-19:      Reduce your risk of any infection  and COVID19 by using the similar precautions used for avoiding the common cold or flu:  [name] Wash your hands often with soap and warm  water for at least 20 seconds.  If soap and water are not readily available, use an alcohol-based hand sanitizer with at least 60% alcohol.  . If coughing or sneezing, cover your mouth and nose by coughing or sneezing into the elbow areas of your shirt or coat, into a tissue or into your sleeve (not your hands). Langley Gauss A MASK when in public  . Avoid shaking hands with others and consider head nods or verbal greetings only. . Avoid touching your eyes, nose, or mouth with unwashed hands.  . Avoid close contact with people who are sick. . Avoid places or events with large numbers of people in one location, like concerts or sporting events. . If you have some symptoms but not all symptoms, continue to monitor at home and seek medical attention if your symptoms worsen. . If you are having a medical emergency, call 911.   [place] / e-Visit: [email or web address]         [facility]: [facility]: [id number]                   [facility]: [id number]     It is flu season:   >>> Best ways to protect herself from the flu: Receive the yearly flu vaccine, practice good hand hygiene  washing with soap and also using hand sanitizer when available, eat a nutritious meals, get adequate rest, hydrate appropriately   Please contact the office if your symptoms worsen or you have concerns that you are not improving.   Thank you for choosing [facility] for your healthcare, and for allowing [place] to partner with you on your healthcare journey. I am thankful to be able to provide care to you today.   Wyn Quaker FNP-C

## 2020-02-21 NOTE — Assessment & Plan Note (Signed)
Continue CPAP therapy Follow-up with our office in 6 months to establish care with Dr. Jenetta Downer

## 2020-02-21 NOTE — Assessment & Plan Note (Addendum)
Plan: continue follow-up with oncology

## 2020-02-21 NOTE — Assessment & Plan Note (Signed)
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

## 2020-02-21 NOTE — Assessment & Plan Note (Signed)
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

## 2020-02-21 NOTE — Progress Notes (Signed)
@Patient  ID: Preston Thomas, male    DOB: 11-22-1950, 69 y.o.   MRN: [id number]  Chief Complaint  Patient presents with  . Follow-up    PFT     Referring provider: Pleas [name], NP  HPI:  69 year old male former smoker followed in our office for severe obstructive sleep apnea and history of COVID-19 infection  PMH: Hyperlipidemia, GERD Smoker/ Smoking History: Former smoker Maintenance:  None  Pt of: Former patient of Dr. Alva Garnet  02/21/2020  - Visit   69 year old male former smoker following up with our office today after completing pulmonary function test.  Preston Thomas is a patient of Dr. Alva Garnet from the [place] office.  Preston Thomas was last seen virtually in February/2021.  At that time Preston Thomas was recommended to remain on his CPAP for treatment of his severe obstructive sleep apnea.  Preston Thomas also status post COVID-19 pneumonia in September/2020.  Pulmonary function testing was scheduled.  Patient's pulmonary function testing results are listed below:  02/15/2020-pulmonary function test-FVC 2.69 (89% predicted), postbronchodilator ratio 87, FEV1 2.19 (98% predicted), no bronchodilator response, DLCO 19.53 (102% predicted)  Patient was initially referred to our [place] office after a Covid 19 infection in July/2020.  Preston Thomas required hospitalization.  Treated with remdesivir.  Never mechanically ventilated.  Was on home oxygen for 4 to 6 weeks status post discharge.  Patient feels that his breathing continues to slowly improve.  Overall Preston Thomas feels that Preston Thomas is getting closer to his baseline.  Patient does have prostate cancer and is currently on medications managed by oncology.  Preston Thomas feels that some of his shortness of breath and fatigue are directly related to this.  Patient continues to use his CPAP every night.  Preston Thomas reports no issues with its use.  Preston Thomas feels clinical benefit with it.  Preston Thomas has known severe obstructive sleep apnea.    Questionaires / Pulmonary Flowsheets:   ACT:  No flowsheet  data found.  MMRC: No flowsheet data found.  Epworth:  Results of the Epworth flowsheet 11/24/2019 05/30/2019  Sitting and reading 0 3  Watching TV 1 3  Sitting, inactive in a public place (e.g. 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, this certainly could be an explanation for her ongoing shortness of breath, aortic arthrosclerosis  05/17/2019-CTA chest-no central segmental or subsegmental PE, patchy peripheral airspace opacities throughout both lungs, there is spectrum of findings in the lungs which can be seen in acute atypical infection as well as other noninfectious etiologies in particular viral pneumonia including COVID-19 should be considered  07/05/2019-home sleep study-AHI 38.6, SaO2 low 61%  02/15/2020-pulmonary function test-FVC 2.69 (89% redo), postbronchodilator ratio 87, postbronchodilator FEV1 2.19 (98% predicted), DLCO 19.53 (102% predicted)  FENO:  No results found for: NITRICOXIDE  PFT: PFT Results Latest Ref Rng & Units 02/15/2020  FVC-Pre L 2.69  FVC-Predicted Pre % 89  FVC-Post L 2.51  FVC-Predicted Post % 83  Pre FEV1/FVC % % 87  Post FEV1/FCV % % 87  FEV1-Pre L 2.33  FEV1-Predicted  Pre % 104  FEV1-Post L 2.19  DLCO UNC% % 102  DLCO COR %Predicted % 122  TLC L 4.27  TLC % Predicted % 85  RV % Predicted % 92    WALK:  SIX MIN WALK 02/21/2020 04/24/2019  Supplimental Oxygen during Test? (L/min) No No  Tech Comments: moderate pace walk with mild shortness of breath on last lap -    Imaging: CT Chest Wo Contrast  Result Date:  02/14/2020 CLINICAL DATA:  Shortness of breath following coronavirus infection. Left-sided chest pain. EXAM: CT CHEST WITHOUT CONTRAST TECHNIQUE: Multidetector CT imaging of the chest was performed following the standard protocol without IV contrast. COMPARISON:  05/30/2019.  05/17/2019. FINDINGS: Cardiovascular: Heart size is normal. Coronary artery calcification is noted. Aortic atherosclerotic calcification is noted. Mediastinum/Nodes: No mediastinal or hilar mass or lymphadenopathy. Lungs/Pleura: Residual scarring in the lung periphery, typical of post coronavirus fibrosis. No evidence of ongoing active infiltrate, collapse or effusion. Upper Abdomen: Negative Musculoskeletal: Ordinary spinal degenerative changes. 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 This could certainly be an explanation for ongoing shortness of breath. Aortic Atherosclerosis (ICD10-I70.0). Coronary artery calcification. Electronically Signed   By: Nelson Chimes M.D.   On: 02/14/2020 14:48    Lab Results:  CBC    Component Value Date/Time   WBC 9.6 09/06/2019 1611   RBC 4.69 09/06/2019 1611   HGB 14.0 09/06/2019 1611   HCT 42.8 09/06/2019 1611   PLT 318.0 09/06/2019 1611   MCV 91.4 09/06/2019 1611   MCH 30.5 05/17/2019 0906   MCHC 32.7 09/06/2019 1611   RDW 13.6 09/06/2019 1611   LYMPHSABS 1.0 09/06/2019 1611   MONOABS 1.0 09/06/2019 1611   EOSABS 0.3 09/06/2019 1611   BASOSABS 0.1 09/06/2019 1611    BMET    Component Value Date/Time   NA 143 09/06/2019 1611   K 4.2 09/06/2019 1611   CL 106 09/06/2019 1611   CO2 29 09/06/2019 1611   GLUCOSE 99 09/06/2019 1611   BUN 18 09/06/2019 1611   CREATININE 1.19 09/06/2019 1611   CALCIUM 9.2 09/06/2019 1611   GFRNONAA >60 05/17/2019 0906   GFRAA >60 05/17/2019 0906    BNP    Component Value Date/Time   BNP 41.0 05/17/2019 0906    ProBNP    Component Value Date/Time   PROBNP 41.0 09/06/2019 1611    Specialty  Problems      Pulmonary Problems   Pneumonia due to COVID-19 virus   OSA (obstructive sleep apnea)   Shortness of breath      Allergies  Allergen Reactions  . Penicillins     Passed out Did it involve swelling of the face/tongue/throat, SOB, or low BP? Yes Did it involve sudden or severe rash/hives, skin peeling, or any reaction on the inside of your mouth or nose? No Did you need to seek medical attention at a hospital or doctor's office? No When did it last happen?30 + years If all above answers are "NO", may proceed with cephalosporin use.     Immunization History  Administered Date(s) Administered  . Fluad Quad(high Dose 65+) 09/07/2019  . Influenza,inj,Quad PF,6+ Mos 08/17/2018  . PFIZER SARS-COV-2 Vaccination 11/10/2019, 12/05/2019  . Pneumococcal Polysaccharide-23 08/17/2018  . Td 04/26/2003    Past Medical History:  Diagnosis Date  . COLONIC POLYPS, HX OF 08/31/2007   Qualifier: Diagnosis of  By: Silvio Pate MD, [facility] Cancer   . DIVERTICULITIS, HX OF 08/31/2007   Qualifier: Diagnosis of  By: Joesph Fillers CMA (  AAMA), Ronny Bacon    . DIVERTICULOSIS, COLON 08/31/2007   Qualifier: Diagnosis of  By: Silvio Pate MD, [facility] Cancer   . Dyspnea    Related to Covid  . GERD (gastroesophageal reflux disease)   . History of kidney stones   . RENAL CALCULUS, HX OF 08/31/2007   Qualifier: Diagnosis of  By: Joesph Fillers CMA (AAMA), Natasha      Tobacco History: Social History   Tobacco Use  Smoking Status Former Smoker  . Packs/day: 1.50  . Years: 10.00  . Pack years: 15.00  . Types: Cigarettes  . Quit date: 09/28/1984  . Years since quitting: 35.4  Smokeless Tobacco Never Used   Counseling given: Yes   Continue to not smoke  Outpatient Encounter Medications as of 02/21/2020  Medication Sig  . atorvastatin (LIPITOR) 20 MG tablet Take 1 tablet (20 mg total) by mouth daily. For cholesterol.  . Cholecalciferol (DIALYVITE VITAMIN D 5000) 125 MCG (5000 UT) capsule Take 5,000 Units by mouth  daily.  [name] omeprazole (PRILOSEC) 20 MG capsule Take 20 mg by mouth daily.  . SUMAtriptan (IMITREX) 100 MG tablet Take 1 tablet by mouth at migraine onset. May repeat in 2 hours if headache persists or recurs. Do not exceed 100 mg in 24 hours.  . tamsulosin (FLOMAX) 0.4 MG CAPS capsule TAKE 1 CAPSULE BY MOUTH EVERY DAY  . vitamin B-12 (CYANOCOBALAMIN) 500 MCG tablet Take 500 mcg by mouth daily.  . vitamin C (ASCORBIC ACID) 250 MG tablet Take 250 mg by mouth daily.  . Zinc 100 MG TABS Take by mouth.   No facility-administered encounter medications on file as of 02/21/2020.     Review of Systems  Review of Systems  Constitutional: Positive for fatigue. Negative for activity change, chills, fever and unexpected weight change.  HENT: Negative for postnasal drip, rhinorrhea, sinus pressure, sinus pain and sore throat.   Eyes: Negative.   Respiratory: Positive for shortness of breath. Negative for cough and wheezing.   Cardiovascular: Negative for chest pain and palpitations.  Gastrointestinal: Negative for constipation, diarrhea, nausea and vomiting.  Endocrine: Negative.   Genitourinary: Negative.   Musculoskeletal: Negative.   Skin: Negative.   Neurological: Negative for dizziness and headaches.  Psychiatric/Behavioral: Negative.  Negative for dysphoric mood. The patient is not nervous/anxious.   All other systems reviewed and are negative.    Physical Exam  BP 118/76 (BP Location: Left Arm, Cuff Size: Normal)   Pulse 74   Ht 4\' 11"  (1.499 m)   Wt 220 lb 6.4 oz (100 kg)   SpO2 98% Comment: RA  BMI 44.52 kg/m   Wt Readings from Last 5 Encounters:  02/21/20 220 lb 6.4 oz (100 kg)  02/20/20 221 lb (100.2 kg)  10/30/19 217 lb 8 oz (98.7 kg)  09/06/19 211 lb 12 oz (96 kg)  08/31/19 195 lb (88.5 kg)    BMI Readings from Last 5 Encounters:  02/21/20 44.52 kg/m  02/20/20 44.64 kg/m  10/30/19 42.48 kg/m  09/06/19 41.35 kg/m  08/31/19 38.08 kg/m     Physical Exam Vitals  and nursing note reviewed.  Constitutional:      General: Preston Thomas is not in acute distress.    Appearance: Normal appearance. Preston Thomas is obese.  HENT:     Head: Normocephalic and atraumatic.     Right Ear: Hearing, tympanic membrane, ear canal and external ear normal. There is no impacted cerumen.     Left Ear: Hearing, tympanic membrane, ear canal and external ear normal. There  is no impacted cerumen.     Nose: Nose normal. No mucosal edema or rhinorrhea.     Right Turbinates: Not enlarged.     Left Turbinates: Not enlarged.     Mouth/Throat:     Mouth: Mucous membranes are dry.     Pharynx: Oropharynx is clear. No oropharyngeal exudate.     Comments: Mallampati 3 Eyes:     Pupils: Pupils are equal, round, and reactive to light.  Cardiovascular:     Rate and Rhythm: Normal rate and regular rhythm.     Pulses: Normal pulses.     Heart sounds: Normal heart sounds. No murmur.  Pulmonary:     Effort: Pulmonary effort is normal.     Breath sounds: Normal breath sounds. No decreased breath sounds, wheezing or rales.  Musculoskeletal:     Cervical back: Normal range of motion.     Right lower leg: No edema.     Left lower leg: No edema.  Lymphadenopathy:     Cervical: No cervical adenopathy.  Skin:    General: Skin is warm and dry.     Capillary Refill: Capillary refill takes less than 2 seconds.     Findings: No erythema or rash.  Neurological:     General: No focal deficit present.     Mental Status: Preston Thomas is alert and oriented to person, place, and time.     Motor: No weakness.     Coordination: Coordination normal.     Gait: Gait is intact. Gait (Tolerated walk in office) normal.  Psychiatric:        Mood and Affect: Mood normal.        Behavior: Behavior normal. Behavior is cooperative.        Thought Content: Thought content normal.        Judgment: Judgment normal.       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  This is reassuring.  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 Jenetta Downer    Return in about 6 months (around 08/23/2020), or if symptoms worsen or fail to improve, for Follow up with Dr. Ander Slade, [place].   Lauraine Rinne, NP 02/21/2020   This appointment required 35 minutes of patient care (this includes precharting, chart review, review of results, face-to-face care, etc.).

## 2020-02-21 NOTE — Assessment & Plan Note (Signed)
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  This is reassuring.  Plan: We will continue to clinically monitor I have encouraged patient to increase overall physical activity consistently each day Walk today in office stable without any oxygen desaturations

## 2020-02-22 ENCOUNTER — Ambulatory Visit: Payer: PPO | Admitting: Urology

## 2020-03-14 DIAGNOSIS — G4733 Obstructive sleep apnea (adult) (pediatric): Secondary | ICD-10-CM | POA: Diagnosis not present

## 2020-03-22 DIAGNOSIS — H43813 Vitreous degeneration, bilateral: Secondary | ICD-10-CM | POA: Diagnosis not present

## 2020-03-26 DIAGNOSIS — G4733 Obstructive sleep apnea (adult) (pediatric): Secondary | ICD-10-CM | POA: Diagnosis not present

## 2020-03-27 DIAGNOSIS — H43813 Vitreous degeneration, bilateral: Secondary | ICD-10-CM | POA: Diagnosis not present

## 2020-03-27 DIAGNOSIS — H5319 Other subjective visual disturbances: Secondary | ICD-10-CM | POA: Diagnosis not present

## 2020-03-27 DIAGNOSIS — H5712 Ocular pain, left eye: Secondary | ICD-10-CM | POA: Diagnosis not present

## 2020-03-27 DIAGNOSIS — Z8546 Personal history of malignant neoplasm of prostate: Secondary | ICD-10-CM | POA: Diagnosis not present

## 2020-03-27 DIAGNOSIS — H35371 Puckering of macula, right eye: Secondary | ICD-10-CM | POA: Diagnosis not present

## 2020-03-27 DIAGNOSIS — H25813 Combined forms of age-related cataract, bilateral: Secondary | ICD-10-CM | POA: Diagnosis not present

## 2020-04-13 DIAGNOSIS — G4733 Obstructive sleep apnea (adult) (pediatric): Secondary | ICD-10-CM | POA: Diagnosis not present

## 2020-04-17 DIAGNOSIS — Z8546 Personal history of malignant neoplasm of prostate: Secondary | ICD-10-CM | POA: Diagnosis not present

## 2020-04-17 DIAGNOSIS — H5712 Ocular pain, left eye: Secondary | ICD-10-CM | POA: Diagnosis not present

## 2020-04-17 DIAGNOSIS — H43812 Vitreous degeneration, left eye: Secondary | ICD-10-CM | POA: Diagnosis not present

## 2020-04-17 DIAGNOSIS — H5319 Other subjective visual disturbances: Secondary | ICD-10-CM | POA: Diagnosis not present

## 2020-04-17 DIAGNOSIS — H35371 Puckering of macula, right eye: Secondary | ICD-10-CM | POA: Diagnosis not present

## 2020-04-22 ENCOUNTER — Other Ambulatory Visit: Payer: Self-pay

## 2020-04-22 ENCOUNTER — Inpatient Hospital Stay: Payer: PPO | Attending: Radiation Oncology

## 2020-04-22 DIAGNOSIS — C61 Malignant neoplasm of prostate: Secondary | ICD-10-CM | POA: Diagnosis not present

## 2020-04-22 LAB — PSA: Prostatic Specific Antigen: 0.15 ng/mL (ref 0.00–4.00)

## 2020-04-29 ENCOUNTER — Ambulatory Visit
Admission: RE | Admit: 2020-04-29 | Discharge: 2020-04-29 | Disposition: A | Discharge status: Home / Self Care | Payer: PPO | Source: Ambulatory Visit | Attending: Radiation Oncology | Admitting: Radiation Oncology

## 2020-04-29 ENCOUNTER — Encounter: Payer: Self-pay | Admitting: Radiation Oncology

## 2020-04-29 ENCOUNTER — Other Ambulatory Visit: Payer: Self-pay

## 2020-04-29 VITALS — BP 162/88 | HR 73 | Temp 95.8°F | Resp 18 | Wt 224.6 lb

## 2020-04-29 DIAGNOSIS — C61 Malignant neoplasm of prostate: Secondary | ICD-10-CM | POA: Diagnosis not present

## 2020-04-29 DIAGNOSIS — Z923 Personal history of irradiation: Secondary | ICD-10-CM | POA: Diagnosis not present

## 2020-04-29 NOTE — Progress Notes (Signed)
Radiation Oncology Follow up Note  Name: Preston Thomas   Date:   04/29/2020 MRN:  [id number] DOB: [birth year]    This 69 y.o. male presents to the clinic today for 42-month follow-up status post I-125 interstitial implant for boost in a patient treated with both external beam treatment as well as his pelvic lymph nodes for Gleason 8 (3+5) adenocarcinoma prostate presenting with a PSA of 12.7.  REFERRING PROVIDER: Pleas [name], NP  HPI: Patient is a 69 year old male now out 10 months having completed both external beam treatment to his prostate and pelvic nodes as well as an I-125 interstitial implant for boost for stage IIb adenocarcinoma the prostate.  Seen today in routine follow-up he is doing well.  He specifically denies any increased lower urinary tract symptoms diarrhea or fatigue.  His most recent PSA is 0.15.[name]  He is currently on leuprolide treatment last injection was in May.  COMPLICATIONS OF TREATMENT: none  FOLLOW UP COMPLIANCE: keeps appointments   PHYSICAL EXAM:  BP (!) 162/88 (Patient Position: Sitting)   Pulse 73   Temp (!) 95.8 F (35.4 C) (Tympanic)   Resp 18   Wt (!) 224 lb 9.6 oz (101.9 kg)   BMI 45.36 kg/m  Well-developed well-nourished patient in NAD. HEENT reveals PERLA, EOMI, discs not visualized.  Oral cavity is clear. No oral mucosal lesions are identified. Neck is clear without evidence of cervical or supraclavicular adenopathy. Lungs are clear to A&P. Cardiac examination is essentially unremarkable with regular rate and rhythm without murmur rub or thrill. Abdomen is benign with no organomegaly or masses noted. Motor sensory and DTR levels are equal and symmetric in the upper and lower extremities. Cranial nerves II through XII are grossly intact. Proprioception is intact. No peripheral adenopathy or edema is identified. No motor or sensory levels are noted. Crude visual fields are within normal range.  RADIOLOGY RESULTS: No current films to  review  PLAN: Present time patient is doing well under excellent biochemical control of his prostate cancer he remains on androgen deprivation therapy through urology.  I have asked to see him back in 6 months for follow-up.  We will obtain a PSA at that time if not already been performed.  Patient otherwise knows to call with any concerns.  I would like to take this opportunity to thank you for allowing me to participate in the care of your patient.Noreene Filbert, MD

## 2020-05-14 DIAGNOSIS — G4733 Obstructive sleep apnea (adult) (pediatric): Secondary | ICD-10-CM | POA: Diagnosis not present

## 2020-06-06 ENCOUNTER — Other Ambulatory Visit: Payer: Self-pay

## 2020-06-06 ENCOUNTER — Other Ambulatory Visit: Payer: Self-pay | Admitting: Primary Care

## 2020-06-06 DIAGNOSIS — E785 Hyperlipidemia, unspecified: Secondary | ICD-10-CM

## 2020-06-06 DIAGNOSIS — G43909 Migraine, unspecified, not intractable, without status migrainosus: Secondary | ICD-10-CM

## 2020-06-07 MED ORDER — SUMATRIPTAN SUCCINATE 100 MG PO TABS: ORAL_TABLET | ORAL | 0 refills | Status: DC

## 2020-06-11 DIAGNOSIS — H5319 Other subjective visual disturbances: Secondary | ICD-10-CM | POA: Diagnosis not present

## 2020-06-11 DIAGNOSIS — H43812 Vitreous degeneration, left eye: Secondary | ICD-10-CM | POA: Diagnosis not present

## 2020-06-11 DIAGNOSIS — H35371 Puckering of macula, right eye: Secondary | ICD-10-CM | POA: Diagnosis not present

## 2020-06-11 DIAGNOSIS — Z8546 Personal history of malignant neoplasm of prostate: Secondary | ICD-10-CM | POA: Diagnosis not present

## 2020-06-11 DIAGNOSIS — H43392 Other vitreous opacities, left eye: Secondary | ICD-10-CM | POA: Diagnosis not present

## 2020-06-14 DIAGNOSIS — G4733 Obstructive sleep apnea (adult) (pediatric): Secondary | ICD-10-CM | POA: Diagnosis not present

## 2020-06-17 IMAGING — DX PORTABLE CHEST - 1 VIEW
1 series · 1 of 1 positions shown · non-contrast
Comparison: 04/19/2019

CLINICAL DATA: History of 8DL4M-H2 positivity with dyspnea

EXAM:
PORTABLE CHEST 1 VIEW

[chest ap]
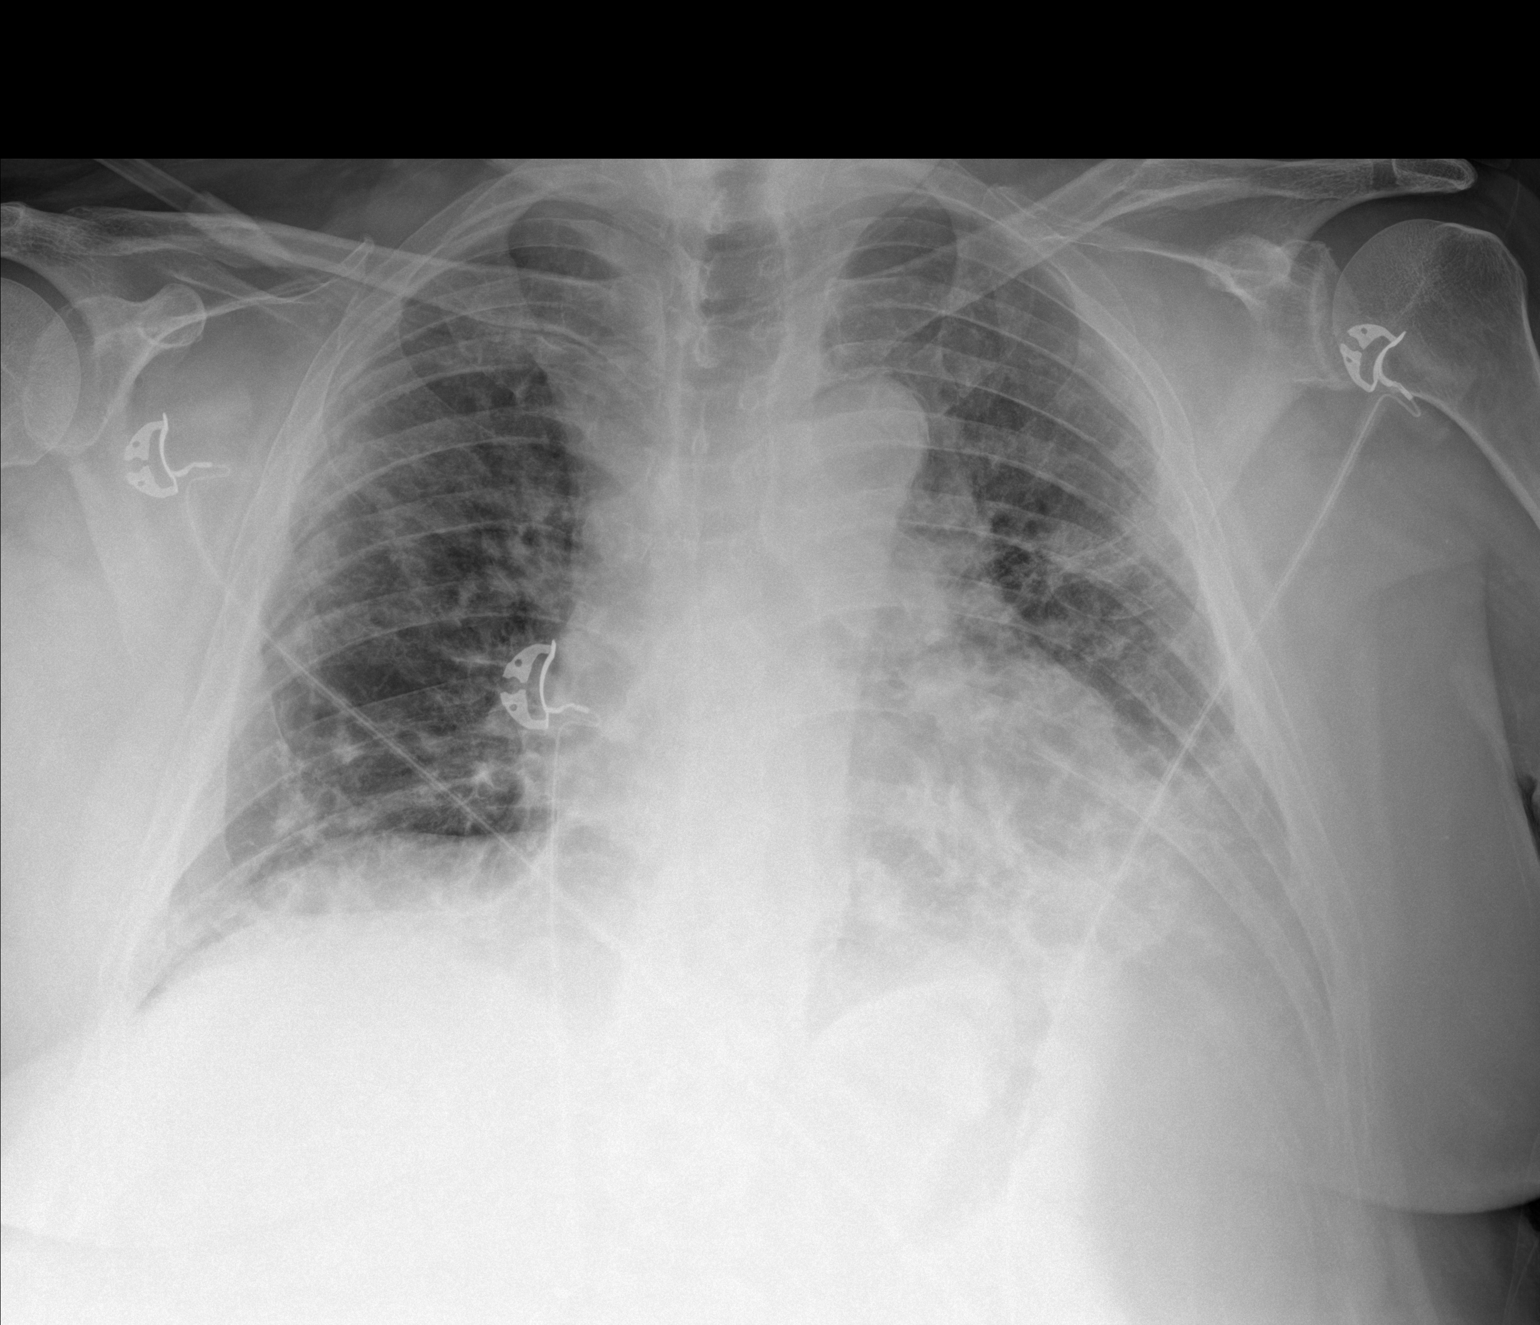

[1 of 1 positions shown; findings below may reference images not displayed]

FINDINGS: Cardiac shadow is mildly enlarged but stable. Patchy infiltrates are
seen bilaterally slightly increased when compared with the prior
exam consistent with the given clinical history. This is most
notable in the left mid and lower lung. No bony abnormality is seen.
IMPRESSION: Increase in patchy infiltrates bilaterally but predominately in the
left lung consistent with the given clinical history of 8DL4M-H2
positivity.

## 2020-06-27 ENCOUNTER — Telehealth: Payer: Self-pay

## 2020-06-27 NOTE — Telephone Encounter (Signed)
Benefit verification form faxed for Lupron/Eligard.

## 2020-07-05 DIAGNOSIS — G4733 Obstructive sleep apnea (adult) (pediatric): Secondary | ICD-10-CM | POA: Diagnosis not present

## 2020-07-06 ENCOUNTER — Other Ambulatory Visit: Payer: Self-pay | Admitting: Urology

## 2020-07-08 NOTE — Telephone Encounter (Signed)
Incoming fax, NO PA required for Lupron/Eligard.

## 2020-07-10 ENCOUNTER — Other Ambulatory Visit: Payer: Self-pay | Admitting: Primary Care

## 2020-07-10 DIAGNOSIS — G43909 Migraine, unspecified, not intractable, without status migrainosus: Secondary | ICD-10-CM

## 2020-07-14 DIAGNOSIS — G4733 Obstructive sleep apnea (adult) (pediatric): Secondary | ICD-10-CM | POA: Diagnosis not present

## 2020-07-16 ENCOUNTER — Ambulatory Visit (INDEPENDENT_AMBULATORY_CARE_PROVIDER_SITE_OTHER): Payer: PPO | Admitting: Internal Medicine

## 2020-07-16 ENCOUNTER — Ambulatory Visit (INDEPENDENT_AMBULATORY_CARE_PROVIDER_SITE_OTHER)
Admission: RE | Admit: 2020-07-16 | Discharge: 2020-07-16 | Disposition: A | Discharge status: Home / Self Care | Payer: PPO | Source: Ambulatory Visit | Attending: Internal Medicine | Admitting: Internal Medicine

## 2020-07-16 ENCOUNTER — Other Ambulatory Visit: Payer: Self-pay

## 2020-07-16 ENCOUNTER — Encounter: Payer: Self-pay | Admitting: Internal Medicine

## 2020-07-16 VITALS — BP 136/92 | HR 81 | Temp 97.8°F | Wt 224.0 lb

## 2020-07-16 DIAGNOSIS — M25476 Effusion, unspecified foot: Secondary | ICD-10-CM

## 2020-07-16 DIAGNOSIS — M19072 Primary osteoarthritis, left ankle and foot: Secondary | ICD-10-CM | POA: Diagnosis not present

## 2020-07-16 DIAGNOSIS — M25475 Effusion, left foot: Secondary | ICD-10-CM

## 2020-07-16 MED ORDER — PREDNISONE 20 MG PO TABS: 20.0000 mg | ORAL_TABLET | Freq: Every day | ORAL | 0 refills | Status: DC

## 2020-07-16 NOTE — Progress Notes (Signed)
Subjective:    Patient ID: Preston Thomas, male    DOB: 06/27/1951, 69 y.o.   MRN: [id number]  HPI  Pt presents to the clinic today with c/o pain in his left great toe. He noticed this 1 month ago. He describes the pain as sharp. The pain is worse when he bends the toe. He reports some associated swelling in the beginning but this has resolved. He denies any injury to the area. He has taken Ibuprofen OTC with minimal relief of symptoms.    Review of Systems      Past Medical History:  Diagnosis Date  . COLONIC POLYPS, HX OF 08/31/2007   Qualifier: Diagnosis of  By: Silvio Pate MD, [facility]   . DIVERTICULITIS, HX OF 08/31/2007   Qualifier: Diagnosis of  By: Joesph Fillers CMA (AAMA), Ronny Bacon    . DIVERTICULOSIS, COLON 08/31/2007   Qualifier: Diagnosis of  By: Silvio Pate MD, [facility]   . Dyspnea    Related to Covid  . GERD (gastroesophageal reflux disease)   . History of kidney stones   . RENAL CALCULUS, HX OF 08/31/2007   Qualifier: Diagnosis of  By: Joesph Fillers CMA (AAMA), Natasha      Current Outpatient Medications  Medication Sig Dispense Refill  . atorvastatin (LIPITOR) 20 MG tablet TAKE 1 TABLET (20 MG TOTAL) BY MOUTH DAILY. FOR CHOLESTEROL. 90 tablet 0  . Cholecalciferol (DIALYVITE VITAMIN D 5000) 125 MCG (5000 UT) capsule Take 5,000 Units by mouth daily.    [name] omeprazole (PRILOSEC) 20 MG capsule Take 20 mg by mouth daily.    . SUMAtriptan (IMITREX) 100 MG tablet TAKE 1 TABLET BY MOUTH AT MIGRAINE ONSET,MAY REPEAT IN 2 HOURS IF HEADACHE PERSISTS/RECURS *DO NOT EXCEED 100MG  IN 24 HOURS** 10 tablet 0  . tamsulosin (FLOMAX) 0.4 MG CAPS capsule TAKE 1 CAPSULE BY MOUTH EVERY DAY 90 capsule 2  . vitamin B-12 (CYANOCOBALAMIN) 500 MCG tablet Take 500 mcg by mouth daily.    . vitamin C (ASCORBIC ACID) 250 MG tablet Take 250 mg by mouth daily.    . Zinc 100 MG TABS Take by mouth.     No current facility-administered medications for this visit.    Allergies  Allergen Reactions  .  Penicillins     Passed out Did it involve swelling of the face/tongue/throat, SOB, or low BP? Yes Did it involve sudden or severe rash/hives, skin peeling, or any reaction on the inside of your mouth or nose? No Did you need to seek medical attention at a hospital or doctor's office? No When did it last happen?30 + years If all above answers are "NO", may proceed with cephalosporin use.     Family History  Problem Relation Age of Onset  . Early death Father   . Kidney disease Father     Social History   Socioeconomic History  . Marital status: Married    Spouse name: Not on file  . Number of children: Not on file  . Years of education: Not on file  . Highest education level: Not on file  Occupational History  . Not on file  Tobacco Use  . Smoking status: Former Smoker    Packs/day: 1.50    Years: 10.00    Pack years: 15.00    Types: Cigarettes    Quit date: 09/28/1984    Years since quitting: 35.8  . Smokeless tobacco: Never Used  Vaping Use  . Vaping Use: Never used  Substance and Sexual Activity  .  Alcohol use: Not Currently  . Drug use: Never  . Sexual activity: Yes  Other Topics Concern  . Not on file  Social History Narrative  . Not on file   Social Determinants of Health   Financial Resource Strain:   . Difficulty of Paying Living Expenses: Not on file  Food Insecurity:   . Worried About Charity fundraiser in the Last Year: Not on file  . Ran Out of Food in the Last Year: Not on file  Transportation Needs:   . Lack of Transportation (Medical): Not on file  . Lack of Transportation (Non-Medical): Not on file  Physical Activity:   . Days of Exercise per Week: Not on file  . Minutes of Exercise per Session: Not on file  Stress:   . Feeling of Stress : Not on file  Social Connections:   . Frequency of Communication with Friends and Family: Not on file  . Frequency of Social Gatherings with Friends and Family: Not on file  . Attends Religious  Services: Not on file  . Active Member of Clubs or Organizations: Not on file  . Attends Archivist Meetings: Not on file  . Marital Status: Not on file  Intimate Partner Violence:   . Fear of Current or Ex-Partner: Not on file  . Emotionally Abused: Not on file  . Physically Abused: Not on file  . Sexually Abused: Not on file     Constitutional: Denies fever, malaise, fatigue, headache or abrupt weight changes.  Respiratory: Denies difficulty breathing, shortness of breath, cough or sputum production.   Cardiovascular: Denies chest pain, chest tightness, palpitations or swelling in the hands or feet.  Musculoskeletal: Pt reports pain in his left great toe. Denies decrease in range of motion, difficulty with gait, muscle pain.  Skin: Denies redness, rashes, lesions or ulcercations.  Neurological: Denies numbness, tingling, weakness or problems with balance and coordination.    No other specific complaints in a complete review of systems (except as listed in HPI above).  Objective:   Physical Exam   BP (!) 136/92   Pulse 81   Temp 97.8 F (36.6 C) (Temporal)   Wt 224 lb (101.6 kg)   SpO2 98%   BMI 45.24 kg/m   Wt Readings from Last 3 Encounters:  04/29/20 (!) 224 lb 9.6 oz (101.9 kg)  02/21/20 220 lb 6.4 oz (100 kg)  02/20/20 221 lb (100.2 kg)    General: Appears his stated age, obese, in NAD. Skin: Warm, dry and intact. No redness or warmth noted. Cardiovascular: Normal rate Pulmonary/Chest: Normal effort. Musculoskeletal: Pain with flexion of the left great toe. Pain with palpation over the MTP great toe. No joint swelling noted. No difficulty with gait.  Neurological: Alert and oriented. Sensation intact to LLE.    BMET    Component Value Date/Time   NA 143 09/06/2019 1611   K 4.2 09/06/2019 1611   CL 106 09/06/2019 1611   CO2 29 09/06/2019 1611   GLUCOSE 99 09/06/2019 1611   BUN 18 09/06/2019 1611   CREATININE 1.19 09/06/2019 1611   CALCIUM 9.2  09/06/2019 1611   GFRNONAA >60 05/17/2019 0906   GFRAA >60 05/17/2019 0906    Lipid Panel     Component Value Date/Time   CHOL 228 (H) 05/15/2019 1100   TRIG 290.0 (H) 05/15/2019 1100   HDL 28.80 (L) 05/15/2019 1100   CHOLHDL 8 05/15/2019 1100   VLDL 58.0 (H) 05/15/2019 1100   LDLCALC 159 (  H) 08/17/2018 1148    CBC    Component Value Date/Time   WBC 9.6 09/06/2019 1611   RBC 4.69 09/06/2019 1611   HGB 14.0 09/06/2019 1611   HCT 42.8 09/06/2019 1611   PLT 318.0 09/06/2019 1611   MCV 91.4 09/06/2019 1611   MCH 30.5 05/17/2019 0906   MCHC 32.7 09/06/2019 1611   RDW 13.6 09/06/2019 1611   LYMPHSABS 1.0 09/06/2019 1611   MONOABS 1.0 09/06/2019 1611   EOSABS 0.3 09/06/2019 1611   BASOSABS 0.1 09/06/2019 1611    Hgb A1C No results found for: HGBA1C         Assessment & Plan:  Pain and Swelling of Left MTP:  Xray left great toe Uric acid level today RX for Prednisone 20 mg x 5 days Encouraged elevation  Return precautions discussed   Webb Silversmith, NP This visit occurred during the SARS-CoV-2 public health emergency.  Safety protocols were in place, including screening questions prior to the visit, additional usage of staff PPE, and extensive cleaning of exam room while observing appropriate contact time as indicated for disinfecting solutions.

## 2020-07-16 NOTE — Patient Instructions (Signed)
Joint Pain  Joint pain can be caused by many things. It is likely to go away if you follow instructions from your doctor for taking care of yourself at home. Sometimes, you may need more treatment. Follow these instructions at home: Managing pain, stiffness, and swelling   If told, put ice on the painful area. ? Put ice in a plastic bag. ? Place a towel between your skin and the bag. ? Leave the ice on for 20 minutes, 2-3 times a day.  If told, put heat on the painful area. Do this as often as told by your doctor. Use the heat source that your doctor recommends, such as a moist heat pack or a heating pad. ? Place a towel between your skin and the heat source. ? Leave the heat on for 20-30 minutes. ? Take off the heat if your skin gets bright red. This is especially important if you are unable to feel pain, heat, or cold. You may have a greater risk of getting burned.  Move your fingers or toes below the painful joint often. This helps with stiffness and swelling.  If possible, raise (elevate) the painful joint above the level of your heart while you are sitting or lying down. To do this, try putting a few pillows under the painful joint. Activity  Rest the painful joint for as long as told. Do not do things that cause pain or make your pain worse.  Begin exercising or stretching the affected area, as told by your doctor. Ask your doctor what types of exercise are safe for you. If you have an elastic bandage, sling, or splint:  Wear the device as told by your doctor. Take it off only as told by your doctor.  Loosen the device if your fingers or toes below the joint: ? Tingle. ? Lose feeling (get numb). ? Get cold and blue.  Keep the device clean.  Ask your doctor if you should take off the device before bathing. You may need to cover it with a watertight covering when you take a bath or a shower. General instructions  Take over-the-counter and prescription medicines only as told  by your doctor.  Do not use any products that contain nicotine or tobacco. These include cigarettes and e-cigarettes. If you need help quitting, ask your doctor.  Keep all follow-up visits as told by your doctor. This is important. Contact a doctor if:  You have pain that gets worse and does not get better with medicine.  Your joint pain does not get better in 3 days.  You have more bruising or swelling.  You have a fever.  You lose 10 lb (4.5 kg) or more without trying. Get help right away if:  You cannot move the joint.  Your fingers or toes tingle, lose feeling, or get cold and blue.  You have a fever along with a joint that is red, warm, and swollen. Summary  Joint pain can be caused by many things. It often goes away if you follow instructions from your doctor for taking care of yourself at home.  Rest the painful joint for as long as told. Do not do things that cause pain or make your pain worse.  Take over-the-counter and prescription medicines only as told by your doctor. This information is not intended to replace advice given to you by your health care provider. Make sure you discuss any questions you have with your health care provider. Document Revised: 08/27/2017 Document Reviewed: 06/30/2017 Elsevier  Patient Education  [employer].

## 2020-07-17 LAB — URIC ACID: Uric Acid, Serum: 6.2 mg/dL (ref 4.0–7.8)

## 2020-08-12 ENCOUNTER — Ambulatory Visit (INDEPENDENT_AMBULATORY_CARE_PROVIDER_SITE_OTHER): Payer: PPO

## 2020-08-12 ENCOUNTER — Other Ambulatory Visit: Payer: Self-pay

## 2020-08-12 DIAGNOSIS — C61 Malignant neoplasm of prostate: Secondary | ICD-10-CM

## 2020-08-12 MED ORDER — LEUPROLIDE ACETATE (6 MONTH) 45 MG ~~LOC~~ KIT
45.0000 mg | PACK | Freq: Once | SUBCUTANEOUS | Status: AC
  Administered 2020-08-12: 45 mg via SUBCUTANEOUS

## 2020-08-12 NOTE — Patient Instructions (Signed)

## 2020-08-12 NOTE — Progress Notes (Signed)
Eligard SubQ Injection   Due to Prostate Cancer patient is present today for a Eligard Injection.  Medication: Eligard 6 month Dose: 45 mg  Location: right upper outer hip  Lot: [id number] Exp: 11/2021  Patient tolerated well, no complications were noted.   Performed by: Gordy Clement, CMA   Patient's next follow up was scheduled for Feb 10, 2022. This appointment was scheduled using wheel and given to patient today along with reminder continue on Vitamin D 800-1000iu and Calium 1000-1200mg  daily while on Androgen Deprivation Therapy.  PA approval dates: NO PA REQUIRED.

## 2020-08-14 DIAGNOSIS — G4733 Obstructive sleep apnea (adult) (pediatric): Secondary | ICD-10-CM | POA: Diagnosis not present

## 2020-08-29 ENCOUNTER — Other Ambulatory Visit: Payer: Self-pay | Admitting: Primary Care

## 2020-08-29 DIAGNOSIS — E785 Hyperlipidemia, unspecified: Secondary | ICD-10-CM

## 2020-08-29 DIAGNOSIS — C61 Malignant neoplasm of prostate: Secondary | ICD-10-CM

## 2020-08-31 DIAGNOSIS — L409 Psoriasis, unspecified: Secondary | ICD-10-CM

## 2020-09-02 ENCOUNTER — Other Ambulatory Visit (INDEPENDENT_AMBULATORY_CARE_PROVIDER_SITE_OTHER): Payer: PPO

## 2020-09-02 ENCOUNTER — Other Ambulatory Visit: Payer: Self-pay

## 2020-09-02 DIAGNOSIS — E785 Hyperlipidemia, unspecified: Secondary | ICD-10-CM | POA: Diagnosis not present

## 2020-09-02 DIAGNOSIS — C61 Malignant neoplasm of prostate: Secondary | ICD-10-CM

## 2020-09-02 LAB — COMPREHENSIVE METABOLIC PANEL
ALT: 31 U/L (ref 0–53)
AST: 14 U/L (ref 0–37)
Albumin: 4.2 g/dL (ref 3.5–5.2)
Alkaline Phosphatase: 57 U/L (ref 39–117)
BUN: 15 mg/dL (ref 6–23)
CO2: 28 mEq/L (ref 19–32)
Calcium: 9.4 mg/dL (ref 8.4–10.5)
Chloride: 107 mEq/L (ref 96–112)
Creatinine, Ser: 0.99 mg/dL (ref 0.40–1.50)
GFR: 79.08 mL/min (ref >60.00)
Glucose, Bld: 98 mg/dL (ref 70–99)
Potassium: 4.8 mEq/L (ref 3.5–5.1)
Sodium: 144 mEq/L (ref 135–145)
Total Bilirubin: 0.3 mg/dL (ref 0.2–1.2)
Total Protein: 6.6 g/dL (ref 6.0–8.3)

## 2020-09-02 LAB — LIPID PANEL
Cholesterol: 157 mg/dL (ref 0–200)
HDL: 39.8 mg/dL (ref >39.00)
LDL Cholesterol: 86 mg/dL (ref 0–99)
NonHDL: 117.6
Total CHOL/HDL Ratio: 4
Triglycerides: 159 mg/dL — ABNORMAL HIGH (ref 0.0–149.0)
VLDL: 31.8 mg/dL (ref 0.0–40.0)

## 2020-09-02 LAB — CBC
HCT: 41.8 % (ref 39.0–52.0)
Hemoglobin: 14 g/dL (ref 13.0–17.0)
MCHC: 33.6 g/dL (ref 30.0–36.0)
MCV: 88.9 fl (ref 78.0–100.0)
Platelets: 299 10*3/uL (ref 150.0–400.0)
RBC: 4.7 Mil/uL (ref 4.22–5.81)
RDW: 14.5 % (ref 11.5–15.5)
WBC: 8.1 10*3/uL (ref 4.0–10.5)

## 2020-09-02 LAB — PSA: PSA: 0.11 ng/mL (ref 0.10–4.00)

## 2020-09-02 LAB — HEMOGLOBIN A1C: Hgb A1c MFr Bld: 6.1 % (ref 4.6–6.5)

## 2020-09-08 ENCOUNTER — Other Ambulatory Visit: Payer: Self-pay | Admitting: Primary Care

## 2020-09-08 DIAGNOSIS — E785 Hyperlipidemia, unspecified: Secondary | ICD-10-CM

## 2020-09-09 NOTE — Telephone Encounter (Signed)
Noted, refill sent to pharmacy. 

## 2020-09-09 NOTE — Telephone Encounter (Signed)
Pt has had labs and CPE app is scheduled for 12/16. Do you want me to call in 30 day or 90 day

## 2020-09-10 ENCOUNTER — Ambulatory Visit (INDEPENDENT_AMBULATORY_CARE_PROVIDER_SITE_OTHER): Payer: PPO

## 2020-09-10 ENCOUNTER — Other Ambulatory Visit: Payer: Self-pay

## 2020-09-10 DIAGNOSIS — Z0000 Encounter for general adult medical examination without abnormal findings: Secondary | ICD-10-CM

## 2020-09-10 NOTE — Patient Instructions (Signed)
Preston Thomas , Thank you for taking time to come for your Medicare Wellness Visit. I appreciate your ongoing commitment to your health goals. Please review the following plan we discussed and let me know if I can assist you in the future.   Screening recommendations/referrals: Colonoscopy: Up to date, completed 09/28/2016, due 09/2026 Recommended yearly ophthalmology/optometry visit for glaucoma screening and checkup Recommended yearly dental visit for hygiene and checkup  Vaccinations: Influenza vaccine: due, will get at upcoming physical  Pneumococcal vaccine: due, will get at upcoming physical  Tdap vaccine: decline-insurance Shingles vaccine: due, check with your insurance regarding coverage if interested    Covid-19: Completed series, booster declined  Advanced directives: Please bring a copy of your POA (Power of [place]) and/or Living Will to your next appointment.   Conditions/risks identified: hyperlipidemia  Next appointment: Follow up in one year for your annual wellness visit.   Preventive Care 67 Years and Older, Male Preventive care refers to lifestyle choices and visits with your health care provider that can promote health and wellness. What does preventive care include?  A yearly physical exam. This is also called an annual well check.  Dental exams once or twice a year.  Routine eye exams. Ask your health care provider how often you should have your eyes checked.  Personal lifestyle choices, including:  Daily care of your teeth and gums.  Regular physical activity.  Eating a healthy diet.  Avoiding tobacco and drug use.  Limiting alcohol use.  Practicing safe sex.  Taking low doses of aspirin every day.  Taking vitamin and mineral supplements as recommended by your health care provider. What happens during an annual well check? The services and screenings done by your health care provider during your annual well check will depend on your age, overall  health, lifestyle risk factors, and family history of disease. Counseling  Your health care provider may ask you questions about your:  Alcohol use.  Tobacco use.  Drug use.  Emotional well-being.  Home and relationship well-being.  Sexual activity.  Eating habits.  History of falls.  Memory and ability to understand (cognition).  Work and work Statistician. Screening  You may have the following tests or measurements:  Height, weight, and BMI.  Blood pressure.  Lipid and cholesterol levels. These may be checked every 5 years, or more frequently if you are over 60 years old.  Skin check.  Lung cancer screening. You may have this screening every year starting at age 67 if you have a 30-pack-year history of smoking and currently smoke or have quit within the past 15 years.  Fecal occult blood test (FOBT) of the stool. You may have this test every year starting at age 67.  Flexible sigmoidoscopy or colonoscopy. You may have a sigmoidoscopy every 5 years or a colonoscopy every 10 years starting at age 67.  Prostate cancer screening. Recommendations will vary depending on your family history and other risks.  Hepatitis C blood test.  Hepatitis B blood test.  Sexually transmitted disease (STD) testing.  Diabetes screening. This is done by checking your blood sugar (glucose) after you have not eaten for a while (fasting). You may have this done every 1-3 years.  Abdominal aortic aneurysm (AAA) screening. You may need this if you are a current or former smoker.  Osteoporosis. You may be screened starting at age 67 if you are at high risk. Talk with your health care provider about your test results, treatment options, and if necessary, the need  for more tests. Vaccines  Your health care provider may recommend certain vaccines, such as:  Influenza vaccine. This is recommended every year.  Tetanus, diphtheria, and acellular pertussis (Tdap, Td) vaccine. You may need a Td  booster every 10 years.  Zoster vaccine. You may need this after age 8.  Pneumococcal 13-valent conjugate (PCV13) vaccine. One dose is recommended after age 62.  Pneumococcal polysaccharide (PPSV23) vaccine. One dose is recommended after age 70. Talk to your health care provider about which screenings and vaccines you need and how often you need them. This information is not intended to replace advice given to you by your health care provider. Make sure you discuss any questions you have with your health care provider. Document Released: 10/11/2015 Document Revised: 06/03/2016 Document Reviewed: 07/16/2015 Elsevier Interactive Patient Education  2017 [employer] Prevention in the Home Falls can cause injuries. They can happen to people of all ages. There are many things you can do to make your home safe and to help prevent falls. What can I do on the outside of my home?  Regularly fix the edges of walkways and driveways and fix any cracks.  Remove anything that might make you trip as you walk through a door, such as a raised step or threshold.  Trim any bushes or trees on the path to your home.  Use bright outdoor lighting.  Clear any walking paths of anything that might make someone trip, such as rocks or tools.  Regularly check to see if handrails are loose or broken. Make sure that both sides of any steps have handrails.  Any raised decks and porches should have guardrails on the edges.  Have any leaves, snow, or ice cleared regularly.  Use sand or salt on walking paths during winter.  Clean up any spills in your garage right away. This includes oil or grease spills. What can I do in the bathroom?  Use night lights.  Install grab bars by the toilet and in the tub and shower. Do not use towel bars as grab bars.  Use non-skid mats or decals in the tub or shower.  If you need to sit down in the shower, use a plastic, non-slip stool.  Keep the floor dry. Clean up  any water that spills on the floor as soon as it happens.  Remove soap buildup in the tub or shower regularly.  Attach bath mats securely with double-sided non-slip rug tape.  Do not have throw rugs and other things on the floor that can make you trip. What can I do in the bedroom?  Use night lights.  Make sure that you have a light by your bed that is easy to reach.  Do not use any sheets or blankets that are too big for your bed. They should not hang down onto the floor.  Have a firm chair that has side arms. You can use this for support while you get dressed.  Do not have throw rugs and other things on the floor that can make you trip. What can I do in the kitchen?  Clean up any spills right away.  Avoid walking on wet floors.  Keep items that you use a lot in easy-to-reach places.  If you need to reach something above you, use a strong step stool that has a grab bar.  Keep electrical cords out of the way.  Do not use floor polish or wax that makes floors slippery. If you must use wax, use  non-skid floor wax.  Do not have throw rugs and other things on the floor that can make you trip. What can I do with my stairs?  Do not leave any items on the stairs.  Make sure that there are handrails on both sides of the stairs and use them. Fix handrails that are broken or loose. Make sure that handrails are as long as the stairways.  Check any carpeting to make sure that it is firmly attached to the stairs. Fix any carpet that is loose or worn.  Avoid having throw rugs at the top or bottom of the stairs. If you do have throw rugs, attach them to the floor with carpet tape.  Make sure that you have a light switch at the top of the stairs and the bottom of the stairs. If you do not have them, ask someone to add them for you. What else can I do to help prevent falls?  Wear shoes that:  Do not have high heels.  Have rubber bottoms.  Are comfortable and fit you well.  Are  closed at the toe. Do not wear sandals.  If you use a stepladder:  Make sure that it is fully opened. Do not climb a closed stepladder.  Make sure that both sides of the stepladder are locked into place.  Ask someone to hold it for you, if possible.  Clearly mark and make sure that you can see:  Any grab bars or handrails.  First and last steps.  Where the edge of each step is.  Use tools that help you move around (mobility aids) if they are needed. These include:  Canes.  Walkers.  Scooters.  Crutches.  Turn on the lights when you go into a dark area. Replace any light bulbs as soon as they burn out.  Set up your furniture so you have a clear path. Avoid moving your furniture around.  If any of your floors are uneven, fix them.  If there are any pets around you, be aware of where they are.  Review your medicines with your doctor. Some medicines can make you feel dizzy. This can increase your chance of falling. Ask your doctor what other things that you can do to help prevent falls. This information is not intended to replace advice given to you by your health care provider. Make sure you discuss any questions you have with your health care provider. Document Released: 07/11/2009 Document Revised: 02/20/2016 Document Reviewed: 10/19/2014 Elsevier Interactive Patient Education  2017 [employer].

## 2020-09-10 NOTE — Progress Notes (Signed)
Subjective:   Preston Thomas is a 69 y.o. male who presents for Medicare Annual/Subsequent preventive examination.  Review of Systems: N/A      I connected with the patient today by telephone and verified that I am speaking with the correct person using two identifiers. Location patient: home Location nurse: work Persons participating in the telephone visit: patient, nurse.   I discussed the limitations, risks, security and privacy concerns of performing an evaluation and management service by telephone and the availability of in person appointments. I also discussed with the patient that there may be a patient responsible charge related to this service. The patient expressed understanding and verbally consented to this telephonic visit.        Cardiac Risk Factors include: advanced age (>63men, >78 women);male gender;Other (see comment), Risk factor comments: hyperlipidemia     Objective:    Today's Vitals   09/10/20 0809  PainSc: 4    There is no height or weight on file to calculate BMI.  Advanced Directives 09/10/2020 04/29/2020 10/30/2019 07/26/2019 06/27/2019 06/07/2019 05/17/2019  Does Patient Have a Medical Advance Directive? Yes No Yes Yes Yes Yes Yes  Type of Paramedic of [place];Living will - [place];Living will [place];Living will Living will Living will;Healthcare Power of [place];Living will  Does patient want to make changes to medical advance directive? - - No - Patient declined No - Patient declined No - Patient declined - No - Patient declined  Copy of [place] in Chart? No - copy requested - No - copy requested No - copy requested - - Yes - validated most recent copy scanned in chart (See row information)  Would patient like information on creating a medical advance directive? - No - Patient declined - - - - No - Patient declined    Current  Medications (verified) Outpatient Encounter Medications as of 09/10/2020  Medication Sig  . atorvastatin (LIPITOR) 20 MG tablet TAKE 1 TABLET (20 MG TOTAL) BY MOUTH DAILY. FOR CHOLESTEROL.  . Cholecalciferol (DIALYVITE VITAMIN D 5000) 125 MCG (5000 UT) capsule Take 5,000 Units by mouth daily.  [name] omeprazole (PRILOSEC) 20 MG capsule Take 20 mg by mouth daily.  . predniSONE (DELTASONE) 20 MG tablet Take 1 tablet (20 mg total) by mouth daily with breakfast.  . SUMAtriptan (IMITREX) 100 MG tablet TAKE 1 TABLET BY MOUTH AT MIGRAINE ONSET,MAY REPEAT IN 2 HOURS IF HEADACHE PERSISTS/RECURS *DO NOT EXCEED 100MG  IN 24 HOURS**  . tamsulosin (FLOMAX) 0.4 MG CAPS capsule TAKE 1 CAPSULE BY MOUTH EVERY DAY  . vitamin B-12 (CYANOCOBALAMIN) 500 MCG tablet Take 500 mcg by mouth daily.  . vitamin C (ASCORBIC ACID) 250 MG tablet Take 250 mg by mouth daily.  . Zinc 100 MG TABS Take by mouth.   No facility-administered encounter medications on file as of 09/10/2020.    Allergies (verified) Penicillins   History: Past Medical History:  Diagnosis Date  . COLONIC POLYPS, HX OF 08/31/2007   Qualifier: Diagnosis of  By: Silvio Pate MD, [facility]   . DIVERTICULITIS, HX OF 08/31/2007   Qualifier: Diagnosis of  By: Joesph Fillers CMA (AAMA), Ronny Bacon    . DIVERTICULOSIS, COLON 08/31/2007   Qualifier: Diagnosis of  By: Silvio Pate MD, [facility]   . Dyspnea    Related to Covid  . GERD (gastroesophageal reflux disease)   . History of kidney stones   . RENAL CALCULUS, HX OF 08/31/2007  Qualifier: Diagnosis of  By: Joesph Fillers CMA (AAMA), Ronny Bacon     Past Surgical History:  Procedure Laterality Date  . APPENDECTOMY  1987  . CHOLECYSTECTOMY  1987  . RADIOACTIVE SEED IMPLANT N/A 06/27/2019   Procedure: RADIOACTIVE SEED IMPLANT/BRACHYTHERAPY IMPLANT;  Surgeon: Abbie Sons, MD;  Location: [facility] ORS;  Service: Urology;  Laterality: N/A;   Family History  Problem Relation Age of Onset  . Early death Father   . Kidney disease  Father    Social History   Socioeconomic History  . Marital status: Married    Spouse name: Not on file  . Number of children: Not on file  . Years of education: Not on file  . Highest education level: Not on file  Occupational History  . Not on file  Tobacco Use  . Smoking status: Former Smoker    Packs/day: 1.50    Years: 10.00    Pack years: 15.00    Types: Cigarettes    Quit date: 09/28/1984    Years since quitting: 35.9  . Smokeless tobacco: Never Used  Vaping Use  . Vaping Use: Never used  Substance and Sexual Activity  . Alcohol use: Not Currently  . Drug use: Never  . Sexual activity: Yes  Other Topics Concern  . Not on file  Social History Narrative  . Not on file   Social Determinants of Health   Financial Resource Strain: Low Risk   . Difficulty of Paying Living Expenses: Not hard at all  Food Insecurity: No Food Insecurity  . Worried About Charity fundraiser in the Last Year: Never true  . Ran Out of Food in the Last Year: Never true  Transportation Needs: No Transportation Needs  . Lack of Transportation (Medical): No  . Lack of Transportation (Non-Medical): No  Physical Activity: Inactive  . Days of Exercise per Week: 0 days  . Minutes of Exercise per Session: 0 min  Stress: No Stress Concern Present  . Feeling of Stress : Not at all  Social Connections: Not on file    Tobacco Counseling Counseling given: Not Answered   Clinical Intake:  Pre-visit preparation completed: Yes  Pain : 0-10 Pain Score: 4  Pain Type: Chronic pain Pain Location:  (joints) Pain Descriptors / Indicators: Aching Pain Onset: More than a month ago Pain Frequency: Intermittent     Nutritional Risks: None Diabetes: No  How often do you need to have someone help you when you read instructions, pamphlets, or other written materials from your doctor or pharmacy?: 1 - Never What is the last grade level you completed in school?: bachelors  Diabetic: No Nutrition  Risk Assessment:  Has the patient had any N/V/D within the last 2 months?  No  Does the patient have any non-healing wounds?  No  Has the patient had any unintentional weight loss or weight gain?  No   Diabetes:  Is the patient diabetic?  No  If diabetic, was a CBG obtained today?  N/A Did the patient bring in their glucometer from home?  N/A How often do you monitor your CBG's? N/A.   Financial Strains and Diabetes Management:  Are you having any financial strains with the device, your supplies or your medication? N/A.  Does the patient want to be seen by Chronic Care Management for management of their diabetes?  N/A Would the patient like to be referred to a Nutritionist or for Diabetic Management?  N/A   Interpreter Needed?: No  Information entered  by :: CJohnson, LPN   Activities of Daily Living In your present state of health, do you have any difficulty performing the following activities: 09/10/2020  Hearing? N  Vision? N  Difficulty concentrating or making decisions? N  Walking or climbing stairs? N  Dressing or bathing? N  Doing errands, shopping? N  Preparing Food and eating ? N  Using the Toilet? N  In the past six months, have you accidently leaked urine? Y  Comment has prostate cancer, medications affect this also  Do you have problems with loss of bowel control? N  Managing your Medications? N  Managing your Finances? N  Housekeeping or managing your Housekeeping? N  Some recent data might be hidden    Patient Care Team: Pleas [name], NP as PCP - General (Internal Medicine)  Indicate any recent Medical Services you may have received from other than [facility] providers in the past year (date may be approximate).     Assessment:   This is a routine wellness examination for [place].  Hearing/Vision screen  Hearing Screening   125Hz  250Hz  500Hz  1000Hz  2000Hz  3000Hz  4000Hz  6000Hz  8000Hz   Right ear:           Left ear:           Vision Screening  Comments: Patient gets annual eye exams   Dietary issues and exercise activities discussed: Current Exercise Habits: The patient does not participate in regular exercise at present, Exercise limited by: None identified  Goals    . Patient Stated     09/10/2020, I will maintain and continue medications as prescribed.       Depression Screen PHQ 2/9 Scores 09/10/2020  PHQ - 2 Score 0  PHQ- 9 Score 0    Fall Risk Fall Risk  09/10/2020  Falls in the past year? 0  Number falls in past yr: 0  Injury with Fall? 0  Risk for fall due to : No Fall Risks  Follow up Falls evaluation completed;Falls prevention discussed    FALL RISK PREVENTION PERTAINING TO THE HOME:  Any stairs in or around the home? Yes  If so, are there any without handrails? No  Home free of loose throw rugs in walkways, pet beds, electrical cords, etc? Yes  Adequate lighting in your home to reduce risk of falls? Yes   ASSISTIVE DEVICES UTILIZED TO PREVENT FALLS:  Life alert? No  Use of a cane, walker or w/c? No  Grab bars in the bathroom? No  Shower chair or bench in shower? No  Elevated toilet seat or a handicapped toilet? No   TIMED UP AND GO:  Was the test performed? N/A, telephonic visit .   Cognitive Function: MMSE - Mini Mental State Exam 09/10/2020  Orientation to time 5  Orientation to Place 5  Registration 3  Attention/ Calculation 5  Recall 3  Language- repeat 1       Mini Cog  Mini-Cog screen was completed. Maximum score is 22. A value of 0 denotes this part of the MMSE was not completed or the patient failed this part of the Mini-Cog screening.  Immunizations Immunization History  Administered Date(s) Administered  . Fluad Quad(high Dose 65+) 09/07/2019  . Influenza,inj,Quad PF,6+ Mos 08/17/2018  . PFIZER SARS-COV-2 Vaccination 11/10/2019, 12/05/2019  . Pneumococcal Polysaccharide-23 08/17/2018  . Td 04/26/2003    TDAP status: Due, Education has been provided regarding the  importance of this vaccine. Advised may receive this vaccine at local pharmacy or Health Dept. Aware  to provide a copy of the vaccination record if obtained from local pharmacy or Health Dept. Verbalized acceptance and understanding.  Flu Vaccine status: Due, Education has been provided regarding the importance of this vaccine. Advised may receive this vaccine at local pharmacy or Health Dept. Aware to provide a copy of the vaccination record if obtained from local pharmacy or Health Dept. Verbalized acceptance and understanding.  Pneumococcal vaccine status: Due, Education has been provided regarding the importance of this vaccine. Advised may receive this vaccine at local pharmacy or Health Dept. Aware to provide a copy of the vaccination record if obtained from local pharmacy or Health Dept. Verbalized acceptance and understanding.  Covid-19 vaccine status: Completed vaccines declined booster at this time  Qualifies for Shingles Vaccine? Yes   Zostavax completed No   Shingrix Completed?: No.    Education has been provided regarding the importance of this vaccine. Patient has been advised to call insurance company to determine out of pocket expense if they have not yet received this vaccine. Advised may also receive vaccine at local pharmacy or Health Dept. Verbalized acceptance and understanding.  Screening Tests Health Maintenance  Topic Date Due  . PNA vac Low Risk Adult (2 of 2 - PCV13) 08/18/2019  . COVID-19 Vaccine (3 - Pfizer risk 4-dose series) 01/02/2020  . INFLUENZA VACCINE  04/28/2020  . TETANUS/TDAP  09/10/2024 (Originally 04/25/2013)  . COLONOSCOPY  09/28/2026  . Hepatitis C Screening  Completed    Health Maintenance  Health Maintenance Due  Topic Date Due  . PNA vac Low Risk Adult (2 of 2 - PCV13) 08/18/2019  . COVID-19 Vaccine (3 - Pfizer risk 4-dose series) 01/02/2020  . INFLUENZA VACCINE  04/28/2020    Colorectal cancer screening: Type of screening: Colonoscopy.  Completed 09/28/2016. Repeat every 10 years  Lung Cancer Screening: (Low Dose CT Chest recommended if Age 22-80 years, 30 pack-year currently smoking OR have quit w/in 15years.) does not qualify.   Additional Screening:  Hepatitis C Screening: does qualify; Completed 08/17/2018  Vision Screening: Recommended annual ophthalmology exams for early detection of glaucoma and other disorders of the eye. Is the patient up to date with their annual eye exam?  Yes  Who is the provider or what is the name of the office in which the patient attends annual eye exams? Dr. Armanda Heritage If pt is not established with a provider, would they like to be referred to a provider to establish care? No .   Dental Screening: Recommended annual dental exams for proper oral hygiene  Community Resource Referral / Chronic Care Management: CRR required this visit?  No   CCM required this visit?  No      Plan:     I have personally reviewed and noted the following in the patient's chart:   . Medical and social history . Use of alcohol, tobacco or illicit drugs  . Current medications and supplements . Functional ability and status . Nutritional status . Physical activity . Advanced directives . List of other physicians . Hospitalizations, surgeries, and ER visits in previous 12 months . Vitals . Screenings to include cognitive, depression, and falls . Referrals and appointments  In addition, I have reviewed and discussed with patient certain preventive protocols, quality metrics, and best practice recommendations. A written personalized care plan for preventive services as well as general preventive health recommendations were provided to patient.   Due to this being a telephonic visit, the after visit summary with patients personalized plan was offered  to patient via office or my-chart. Patient preferred to pick up at office at next visit or via mychart.   Andrez Grime, LPN   23/76/[id number]

## 2020-09-10 NOTE — Progress Notes (Signed)
PCP notes:  Health Maintenance: Flu- due Prevnar 41- due Tdap- insurance Covid booster declined at this time   Abnormal Screenings: none   Patient concerns: none   Nurse concerns: none   Next PCP appt.: 09/12/2020 @ 9 am

## 2020-09-12 ENCOUNTER — Ambulatory Visit (INDEPENDENT_AMBULATORY_CARE_PROVIDER_SITE_OTHER): Payer: PPO | Admitting: Primary Care

## 2020-09-12 ENCOUNTER — Other Ambulatory Visit: Payer: Self-pay

## 2020-09-12 ENCOUNTER — Encounter: Payer: Self-pay | Admitting: Primary Care

## 2020-09-12 VITALS — BP 134/88 | HR 92 | Temp 97.7°F | Ht 59.0 in | Wt 227.0 lb

## 2020-09-12 DIAGNOSIS — R609 Edema, unspecified: Secondary | ICD-10-CM | POA: Diagnosis not present

## 2020-09-12 DIAGNOSIS — K219 Gastro-esophageal reflux disease without esophagitis: Secondary | ICD-10-CM | POA: Diagnosis not present

## 2020-09-12 DIAGNOSIS — R7303 Prediabetes: Secondary | ICD-10-CM | POA: Diagnosis not present

## 2020-09-12 DIAGNOSIS — Z23 Encounter for immunization: Secondary | ICD-10-CM | POA: Diagnosis not present

## 2020-09-12 DIAGNOSIS — G4733 Obstructive sleep apnea (adult) (pediatric): Secondary | ICD-10-CM

## 2020-09-12 DIAGNOSIS — Z0000 Encounter for general adult medical examination without abnormal findings: Secondary | ICD-10-CM | POA: Diagnosis not present

## 2020-09-12 DIAGNOSIS — C61 Malignant neoplasm of prostate: Secondary | ICD-10-CM

## 2020-09-12 DIAGNOSIS — E785 Hyperlipidemia, unspecified: Secondary | ICD-10-CM | POA: Diagnosis not present

## 2020-09-12 DIAGNOSIS — G43909 Migraine, unspecified, not intractable, without status migrainosus: Secondary | ICD-10-CM

## 2020-09-12 MED ORDER — ZOSTER VAC RECOMB ADJUVANTED 50 MCG/0.5ML IM SUSR: 0.5000 mL | Freq: Once | INTRAMUSCULAR | 1 refills | Status: AC

## 2020-09-12 NOTE — Assessment & Plan Note (Signed)
A1C today of 6.1. He eats a diet loaded with bread, potatoes, pasta, rice, etc. Discussed a healthy diet. Handout provided.  Repeat A1C in 6 months.

## 2020-09-12 NOTE — Patient Instructions (Signed)
Start exercising. You should be getting 150 minutes of exercise weekly.  It is important that you improve your diet. Please limit carbohydrates in the form of white bread, rice, pasta, sweets, fast food, fried food, sugary drinks, etc. Increase your consumption of fresh fruits and vegetables, whole grains, lean protein.  Ensure you are consuming 64 ounces of water daily.  Take the shingles vaccine to the pharmacy for administration.  Set up a lab appointment for 6 months to repeat your blood sugar test.  It was a pleasure to see you today!   Prediabetes Eating Plan Prediabetes is a condition that causes blood sugar (glucose) levels to be higher than normal. This increases the risk for developing diabetes. In order to prevent diabetes from developing, your health care provider may recommend a diet and other lifestyle changes to help you:  Control your blood glucose levels.  Improve your cholesterol levels.  Manage your blood pressure. Your health care provider may recommend working with a diet and nutrition specialist (dietitian) to make a meal plan that is best for you. What are tips for following this plan? Lifestyle  Set weight loss goals with the help of your health care team. It is recommended that most people with prediabetes lose 7% of their current body weight.  Exercise for at least 30 minutes at least 5 days a week.  Attend a support group or seek ongoing support from a mental health counselor.  Take over-the-counter and prescription medicines only as told by your health care provider. Reading food labels  Read food labels to check the amount of fat, salt (sodium), and sugar in prepackaged foods. Avoid foods that have: ? Saturated fats. ? Trans fats. ? Added sugars.  Avoid foods that have more than 300 milligrams (mg) of sodium per serving. Limit your daily sodium intake to less than 2,300 mg each day. Shopping  Avoid buying pre-made and processed  foods. Cooking  Cook with olive oil. Do not use butter, lard, or ghee.  Bake, broil, grill, or boil foods. Avoid frying. Meal planning   Work with your dietitian to develop an eating plan that is right for you. This may include: ? Tracking how many calories you take in. Use a food diary, notebook, or mobile application to track what you eat at each meal. ? Using the glycemic index (GI) to plan your meals. The index tells you how quickly a food will raise your blood glucose. Choose low-GI foods. These foods take a longer time to raise blood glucose.  Consider following a Mediterranean diet. This diet includes: ? Several servings each day of fresh fruits and vegetables. ? Eating fish at least twice a week. ? Several servings each day of whole grains, beans, nuts, and seeds. ? Using olive oil instead of other fats. ? Moderate alcohol consumption. ? Eating small amounts of red meat and whole-fat dairy.  If you have high blood pressure, you may need to limit your sodium intake or follow a diet such as the DASH eating plan. DASH is an eating plan that aims to lower high blood pressure. What foods are recommended? The items listed below may not be a complete list. Talk with your dietitian about what dietary choices are best for you. Grains Whole grains, such as whole-wheat or whole-grain breads, crackers, cereals, and pasta. Unsweetened oatmeal. Bulgur. Barley. Quinoa. Brown rice. Corn or whole-wheat flour tortillas or taco shells. Vegetables Lettuce. Spinach. Peas. Beets. Cauliflower. Cabbage. Broccoli. Carrots. Tomatoes. Squash. Eggplant. Herbs. Peppers. Onions. Cucumbers.  Brussels sprouts. Fruits Berries. Bananas. Apples. Oranges. Grapes. Papaya. Mango. Pomegranate. Kiwi. Grapefruit. Cherries. Meats and other protein foods Seafood. Poultry without skin. Lean cuts of pork and beef. Tofu. Eggs. Nuts. Beans. Dairy Low-fat or fat-free dairy products, such as yogurt, cottage cheese, and  cheese. Beverages Water. Tea. Coffee. Sugar-free or diet soda. Seltzer water. Lowfat or no-fat milk. Milk alternatives, such as soy or almond milk. Fats and oils Olive oil. Canola oil. Sunflower oil. Grapeseed oil. Avocado. Walnuts. Sweets and desserts Sugar-free or low-fat pudding. Sugar-free or low-fat ice cream and other frozen treats. Seasoning and other foods Herbs. Sodium-free spices. Mustard. Relish. Low-fat, low-sugar ketchup. Low-fat, low-sugar barbecue sauce. Low-fat or fat-free mayonnaise. What foods are not recommended? The items listed below may not be a complete list. Talk with your dietitian about what dietary choices are best for you. Grains Refined white flour and flour products, such as bread, pasta, snack foods, and cereals. Vegetables Canned vegetables. Frozen vegetables with butter or cream sauce. Fruits Fruits canned with syrup. Meats and other protein foods Fatty cuts of meat. Poultry with skin. Breaded or fried meat. Processed meats. Dairy Full-fat yogurt, cheese, or milk. Beverages Sweetened drinks, such as sweet iced tea and soda. Fats and oils Butter. Lard. Ghee. Sweets and desserts Baked goods, such as cake, cupcakes, pastries, cookies, and cheesecake. Seasoning and other foods Spice mixes with added salt. Ketchup. Barbecue sauce. Mayonnaise. Summary  To prevent diabetes from developing, you may need to make diet and other lifestyle changes to help control blood sugar, improve cholesterol levels, and manage your blood pressure.  Set weight loss goals with the help of your health care team. It is recommended that most people with prediabetes lose 7 percent of their current body weight.  Consider following a Mediterranean diet that includes plenty of fresh fruits and vegetables, whole grains, beans, nuts, seeds, fish, lean meat, low-fat dairy, and healthy oils. This information is not intended to replace advice given to you by your health care provider.  Make sure you discuss any questions you have with your health care provider. Document Revised: 01/06/2019 Document Reviewed: 11/18/2016 Elsevier Patient Education  2020 [employer].

## 2020-09-12 NOTE — Assessment & Plan Note (Signed)
Chronic, secondary to Eligard.  Continue to monitor.

## 2020-09-12 NOTE — Assessment & Plan Note (Signed)
Stable, infrequent symptoms on omeprazole 20 mg daily, continue same.

## 2020-09-12 NOTE — Progress Notes (Signed)
Subjective:    Patient ID: Preston Thomas, male    DOB: 1951/07/02, 69 y.o.   MRN: [id number]  HPI  This visit occurred during the SARS-CoV-2 public health emergency.  Safety protocols were in place, including screening questions prior to the visit, additional usage of staff PPE, and extensive cleaning of exam room while observing appropriate contact time as indicated for disinfecting solutions.   Preston Thomas is a 69 year old male who presents today for complete physical.  Immunizations: -Tetanus: Completed in 2004 -Influenza: Due, declines  -Shingles: Never completed -Pneumonia: Completed Pneumovax in 2019 -Covid-19: Completed series  Diet: He endorses a healthy diet. He eats a lot of rice, potatoes, pasta, bread. He does not eat sugary foods.  Exercise: He is trying to walk some.   Eye exam: Completes annually  Dental exam: Completes regularly   Colonoscopy: Completed in 2018, due in 2028 PSA: UTD. Hep C Screen: Negative  BP Readings from Last 3 Encounters:  09/12/20 134/88  07/16/20 (!) 136/92  04/29/20 (!) 162/88     Review of Systems  Constitutional: Negative for unexpected weight change.  HENT: Negative for rhinorrhea.   Eyes: Negative for visual disturbance.  Respiratory: Positive for shortness of breath. Negative for cough.   Cardiovascular: Positive for leg swelling. Negative for chest pain.  Gastrointestinal: Negative for constipation and diarrhea.  Genitourinary: Negative for difficulty urinating.  Musculoskeletal: Negative for arthralgias and myalgias.  Skin: Negative for rash.  Allergic/Immunologic: Negative for environmental allergies.  Neurological: Negative for dizziness and headaches.  Psychiatric/Behavioral: The patient is not nervous/anxious.        Past Medical History:  Diagnosis Date  . COLONIC POLYPS, HX OF 08/31/2007   Qualifier: Diagnosis of  By: Silvio Pate MD, [facility] Cancer   . DIVERTICULITIS, HX OF 08/31/2007   Qualifier:  Diagnosis of  By: Joesph Fillers CMA (AAMA), Ronny Bacon    . DIVERTICULOSIS, COLON 08/31/2007   Qualifier: Diagnosis of  By: Silvio Pate MD, [facility] Cancer   . Dyspnea    Related to Covid  . GERD (gastroesophageal reflux disease)   . History of kidney stones   . RENAL CALCULUS, HX OF 08/31/2007   Qualifier: Diagnosis of  By: Joesph Fillers CMA (AAMA), Natasha       Social History   Socioeconomic History  . Marital status: Married    Spouse name: Not on file  . Number of children: Not on file  . Years of education: Not on file  . Highest education level: Not on file  Occupational History  . Not on file  Tobacco Use  . Smoking status: Former Smoker    Packs/day: 1.50    Years: 10.00    Pack years: 15.00    Types: Cigarettes    Quit date: 09/28/1984    Years since quitting: 35.9  . Smokeless tobacco: Never Used  Vaping Use  . Vaping Use: Never used  Substance and Sexual Activity  . Alcohol use: Not Currently  . Drug use: Never  . Sexual activity: Yes  Other Topics Concern  . Not on file  Social History Narrative  . Not on file   Social Determinants of Health   Financial Resource Strain: Low Risk   . Difficulty of Paying Living Expenses: Not hard at all  Food Insecurity: No Food Insecurity  . Worried About Charity fundraiser in the Last Year: Never true  . Ran Out of Food in the Last Year: Never true  Transportation Needs: No Transportation Needs  .  Lack of Transportation (Medical): No  . Lack of Transportation (Non-Medical): No  Physical Activity: Inactive  . Days of Exercise per Week: 0 days  . Minutes of Exercise per Session: 0 min  Stress: No Stress Concern Present  . Feeling of Stress : Not at all  Social Connections: Not on file  Intimate Partner Violence: Not At Risk  . Fear of Current or Ex-Partner: No  . Emotionally Abused: No  . Physically Abused: No  . Sexually Abused: No    Past Surgical History:  Procedure Laterality Date  . APPENDECTOMY  1987  . CHOLECYSTECTOMY  1987   . RADIOACTIVE SEED IMPLANT N/A 06/27/2019   Procedure: RADIOACTIVE SEED IMPLANT/BRACHYTHERAPY IMPLANT;  Surgeon: Abbie Sons, MD;  Location: [facility] ORS;  Service: Urology;  Laterality: N/A;    Family History  Problem Relation Age of Onset  . Early death Father   . Kidney disease Father     Allergies  Allergen Reactions  . Penicillins     Passed out Did it involve swelling of the face/tongue/throat, SOB, or low BP? Yes Did it involve sudden or severe rash/hives, skin peeling, or any reaction on the inside of your mouth or nose? No Did you need to seek medical attention at a hospital or doctor's office? No When did it last happen?30 + years If all above answers are "NO", may proceed with cephalosporin use.     Current Outpatient Medications on File Prior to Visit  Medication Sig Dispense Refill  . atorvastatin (LIPITOR) 20 MG tablet TAKE 1 TABLET (20 MG TOTAL) BY MOUTH DAILY. FOR CHOLESTEROL. 90 tablet 0  . Cholecalciferol (DIALYVITE VITAMIN D 5000) 125 MCG (5000 UT) capsule Take 5,000 Units by mouth daily.    [name] leuprolide, 6 Month, (ELIGARD) 45 MG injection Inject 45 mg into the skin every 6 (six) months.    [name] omeprazole (PRILOSEC) 20 MG capsule Take 20 mg by mouth daily.    . SUMAtriptan (IMITREX) 100 MG tablet TAKE 1 TABLET BY MOUTH AT MIGRAINE ONSET,MAY REPEAT IN 2 HOURS IF HEADACHE PERSISTS/RECURS *DO NOT EXCEED 100MG  IN 24 HOURS** 10 tablet 0  . tamsulosin (FLOMAX) 0.4 MG CAPS capsule TAKE 1 CAPSULE BY MOUTH EVERY DAY 90 capsule 2  . vitamin B-12 (CYANOCOBALAMIN) 500 MCG tablet Take 500 mcg by mouth daily.    . vitamin C (ASCORBIC ACID) 250 MG tablet Take 250 mg by mouth daily.    . Zinc 100 MG TABS Take by mouth.     No current facility-administered medications on file prior to visit.    BP 134/88   Pulse 92   Temp 97.7 F (36.5 C) (Temporal)   Ht 4\' 11"  (1.499 m)   Wt 227 lb (103 kg)   SpO2 96%   BMI 45.85 kg/m    Objective:   Physical  Exam Constitutional:      Appearance: He is well-nourished.  HENT:     Right Ear: Tympanic membrane and ear canal normal.     Left Ear: Tympanic membrane and ear canal normal.     Mouth/Throat:     Mouth: Oropharynx is clear and moist.  Eyes:     Extraocular Movements: EOM normal.     Pupils: Pupils are equal, round, and reactive to light.  Cardiovascular:     Rate and Rhythm: Normal rate and regular rhythm.  Pulmonary:     Effort: Pulmonary effort is normal.     Breath sounds: Normal breath sounds.  Abdominal:  General: Bowel sounds are normal.     Palpations: Abdomen is soft.     Tenderness: There is no abdominal tenderness.  Musculoskeletal:     Cervical back: Neck supple.     Comments: Generalized decrease in ROM to hands and lower extremities. Chronic.   Skin:    General: Skin is warm and dry.  Neurological:     Mental Status: He is alert and oriented to person, place, and time.     Cranial Nerves: No cranial nerve deficit.     Deep Tendon Reflexes:     Reflex Scores:      Patellar reflexes are 2+ on the right side and 2+ on the left side. Psychiatric:        Mood and Affect: Mood and affect and mood normal.            Assessment & Plan:

## 2020-09-12 NOTE — Assessment & Plan Note (Signed)
Compliant to atorvastatin 20 mg, recent lipid panel stable. Continue same.

## 2020-09-12 NOTE — Assessment & Plan Note (Signed)
Compliant to CPAP machine, following with pulmonology. Continue same.

## 2020-09-12 NOTE — Assessment & Plan Note (Signed)
Infrequent overall, no headaches. Continue to monitor. Continue sumatriptan 100 mg PRN.

## 2020-09-12 NOTE — Assessment & Plan Note (Signed)
Shingrix due, Rx printed. Declines influenza vaccination.  PSA UTD. Colonoscopy UTD, due 2028.  Discussed the importance of a healthy diet and regular exercise in order for weight loss, and to reduce the risk of any potential medical problems.  Exam today stable. Labs reviewed.

## 2020-09-12 NOTE — Assessment & Plan Note (Addendum)
Follows with oncology, managed on Eligard every 6 months. Continue current regimen.  Continue tamsulosin 0.4 mg daily.

## 2020-09-13 DIAGNOSIS — G4733 Obstructive sleep apnea (adult) (pediatric): Secondary | ICD-10-CM | POA: Diagnosis not present

## 2020-09-16 ENCOUNTER — Other Ambulatory Visit: Payer: Self-pay | Admitting: Primary Care

## 2020-09-16 DIAGNOSIS — L409 Psoriasis, unspecified: Secondary | ICD-10-CM

## 2020-09-16 MED ORDER — FLUTICASONE PROPIONATE 0.05 % EX CREA: TOPICAL_CREAM | Freq: Two times a day (BID) | CUTANEOUS | 0 refills | Status: DC

## 2020-09-16 MED ORDER — FLUOCINONIDE 0.05 % EX SOLN: 1.0000 "application " | Freq: Two times a day (BID) | CUTANEOUS | 0 refills | Status: DC

## 2020-10-08 ENCOUNTER — Encounter: Payer: Self-pay | Admitting: *Deleted

## 2020-10-11 DIAGNOSIS — G4733 Obstructive sleep apnea (adult) (pediatric): Secondary | ICD-10-CM | POA: Diagnosis not present

## 2020-10-23 NOTE — Telephone Encounter (Signed)
Called patient to schedule OV. Scheduled with Dr.Copland 1/27.

## 2020-10-24 ENCOUNTER — Other Ambulatory Visit: Payer: Self-pay

## 2020-10-24 ENCOUNTER — Encounter: Payer: Self-pay | Admitting: Family Medicine

## 2020-10-24 ENCOUNTER — Ambulatory Visit (INDEPENDENT_AMBULATORY_CARE_PROVIDER_SITE_OTHER): Payer: PPO | Admitting: Family Medicine

## 2020-10-24 VITALS — BP 120/82 | HR 80 | Temp 97.5°F | Ht 59.0 in | Wt 229.2 lb

## 2020-10-24 DIAGNOSIS — M5416 Radiculopathy, lumbar region: Secondary | ICD-10-CM

## 2020-10-24 MED ORDER — PREDNISONE 20 MG PO TABS: ORAL_TABLET | ORAL | 0 refills | Status: DC

## 2020-10-24 MED ORDER — CYCLOBENZAPRINE HCL 10 MG PO TABS: 5.0000 mg | ORAL_TABLET | Freq: Every evening | ORAL | 0 refills | Status: DC | PRN

## 2020-10-24 NOTE — Progress Notes (Signed)
Preston Maharaj T. Giovonni Poirier, MD, [place]  Primary [facility] and [place] at [facility] [place], [address]  Phone: [phone]   FAX: [place] Thomas - 70 y.o. male   MRN [id number]   Date of Birth: Mar 03, 1951  Date: 10/24/2020   PCP: Pleas [name], NP   Referral: Pleas [name], NP  Chief Complaint  Patient presents with   Back Pain    Radiates down to legs x 2 weeks    This visit occurred during the SARS-CoV-2 public health emergency.  Safety protocols were in place, including screening questions prior to the visit, additional usage of staff PPE, and extensive cleaning of exam room while observing appropriate contact time as indicated for disinfecting solutions.   Subjective:   Preston Thomas is a 70 y.o. very pleasant male patient who presents with the following: Back Pain  ongoing for approximately: 2 to 3 weeks The patient has had back pain before, but it has been nothing like this. The back pain is localized into the lumbar spine area. They also describe radiculopathy on the left side in the posterior aspect of the thigh.  No numbness or tingling. No bowel or bladder incontinence. No focal weakness. Prior interventions: None Physical therapy: No Chiropractic manipulations: No Acupuncture: No Osteopathic manipulation: No Heat or cold: Minimal effect  Had something over the summer.  Has a lot of joint and bone issues from cancer drugs.  He does have generalized joint pain as well as weight gain while on Eligard.  Right on the L posterior pelvis Will hurt quite a bit.  Can hardly lift right now Rad to the knee  No numbness or focal weakness   Review of Systems is noted in the HPI, as appropriate  Objective:   Blood pressure 120/82, pulse 80, temperature (!) 97.5 F (36.4 C), temperature source Temporal, height 4\' 11"  (1.499 m), weight 229 lb 4 oz (104 kg), SpO2  97 %.  GEN: No acute distress; alert,appropriate. PULM: Breathing comfortably in no respiratory distress PSYCH: Normally interactive.   Range of motion at  the waist: Flexion, rotation and lateral bending: Forward flexion limited to 50 degrees.  Extension lateral bending and rotational movements are all intact  No echymosis or edema Rises to examination table with no difficulty Gait: minimally antalgic  Inspection/Deformity: No abnormality Paraspinus T: Predominantly at L4-5 left greater than right.  Globally to a minor extent L3-L4, L5-S1.  B Ankle Dorsiflexion (L5,4): 5/5 B Great Toe Dorsiflexion (L5,4): 5/5 Heel Walk (L5): WNL Toe Walk (S1): WNL Rise/Squat (L4): WNL, mild pain  SENSORY B Medial Foot (L4): WNL B Dorsum (L5): WNL B Lateral (S1): WNL Light Touch: WNL Pinprick: WNL  REFLEXES Knee (L4): 2+ Ankle (S1): 2+  B SLR, seated: neg B SLR, supine: neg B FABER: neg B Reverse FABER: neg B Greater Troch: NT B Log Roll: neg B Stork: NT B Sciatic Notch: NT  Radiology: No results found.  Assessment and Plan:     ICD-10-CM   1. Lumbar radiculopathy, acute  M54.16    Anatomy reviewed. Conservative algorithms for acute back pain generally begin with the following: NSAIDS, Muscle Relaxants, Mild pain medication.  In a case of some significant radiculopathy, I think that steroids are more appropriate.  For now start with gentle range of motion.  Social: Back pain is limiting his basic activities and motion in the home, as well as any  form of physical activity.  Follow-up: No follow-ups on file.  Meds ordered this encounter  Medications   predniSONE (DELTASONE) 20 MG tablet    Sig: 2 tabs po for 7 days, then 1 tab po for 7 days    Dispense:  21 tablet    Refill:  0   cyclobenzaprine (FLEXERIL) 10 MG tablet    Sig: Take 0.5-1 tablets (5-10 mg total) by mouth at bedtime as needed for muscle spasms.    Dispense:  30 tablet    Refill:  0   There are no  discontinued medications. No orders of the defined types were placed in this encounter.   Signed,  Maud Deed. Alexandro Line, MD   Outpatient Encounter Medications as of 10/24/2020  Medication Sig   atorvastatin (LIPITOR) 20 MG tablet TAKE 1 TABLET (20 MG TOTAL) BY MOUTH DAILY. FOR CHOLESTEROL.   Cholecalciferol (DIALYVITE VITAMIN D 5000) 125 MCG (5000 UT) capsule Take 5,000 Units by mouth daily.   cyclobenzaprine (FLEXERIL) 10 MG tablet Take 0.5-1 tablets (5-10 mg total) by mouth at bedtime as needed for muscle spasms.   fluocinonide (LIDEX) 0.05 % external solution Apply 1 application topically 2 (two) times daily.   fluticasone (CUTIVATE) 0.05 % cream Apply topically 2 (two) times daily.   leuprolide, 6 Month, (ELIGARD) 45 MG injection Inject 45 mg into the skin every 6 (six) months.   omeprazole (PRILOSEC) 20 MG capsule Take 20 mg by mouth daily.   predniSONE (DELTASONE) 20 MG tablet 2 tabs po for 7 days, then 1 tab po for 7 days   SUMAtriptan (IMITREX) 100 MG tablet TAKE 1 TABLET BY MOUTH AT MIGRAINE ONSET,MAY REPEAT IN 2 HOURS IF HEADACHE PERSISTS/RECURS *DO NOT EXCEED 100MG  IN 24 HOURS**   tamsulosin (FLOMAX) 0.4 MG CAPS capsule TAKE 1 CAPSULE BY MOUTH EVERY DAY   vitamin B-12 (CYANOCOBALAMIN) 500 MCG tablet Take 500 mcg by mouth daily.   vitamin C (ASCORBIC ACID) 250 MG tablet Take 250 mg by mouth daily.   Zinc 100 MG TABS Take by mouth.   No facility-administered encounter medications on file as of 10/24/2020.

## 2020-11-21 ENCOUNTER — Ambulatory Visit (INDEPENDENT_AMBULATORY_CARE_PROVIDER_SITE_OTHER): Payer: PPO | Admitting: Family Medicine

## 2020-11-21 ENCOUNTER — Encounter: Payer: Self-pay | Admitting: Family Medicine

## 2020-11-21 ENCOUNTER — Other Ambulatory Visit: Payer: Self-pay

## 2020-11-21 VITALS — BP 120/84 | HR 83 | Temp 97.3°F | Ht 59.0 in | Wt 235.5 lb

## 2020-11-21 DIAGNOSIS — M549 Dorsalgia, unspecified: Secondary | ICD-10-CM | POA: Diagnosis not present

## 2020-11-21 MED ORDER — DICLOFENAC SODIUM 75 MG PO TBEC: 75.0000 mg | DELAYED_RELEASE_TABLET | Freq: Two times a day (BID) | ORAL | 1 refills | Status: DC

## 2020-11-21 MED ORDER — CYCLOBENZAPRINE HCL 10 MG PO TABS: 5.0000 mg | ORAL_TABLET | Freq: Every evening | ORAL | 0 refills | Status: DC | PRN

## 2020-11-21 NOTE — Progress Notes (Signed)
Preston Tamez T. Alyzza Andringa, MD, [place]  Primary Care and Sports Medicine [facility] [place], [address]  Phone: [phone]  FAX: [name] - 70 y.o. male  MRN [id number]  Date of Birth: [birth year]  Date: 11/21/2020  PCP: Pleas [name], NP  Referral: Pleas [name], NP  Chief Complaint  Patient presents with  . Follow-up    Low back pain    This visit occurred during the SARS-CoV-2 public health emergency.  Safety protocols were in place, including screening questions prior to the visit, additional usage of staff PPE, and extensive cleaning of exam room while observing appropriate contact time as indicated for disinfecting solutions.   Subjective:   Preston Thomas is a 70 y.o. very pleasant male patient with Body mass index is 47.57 kg/m. who presents with the following:  When I saw him originally, the patient was in some severe back pain, and then give him a prolonged course of steroids and he has done better.  Did really well on his steroids.  Starrted back walking after ten.  Last Friday, gradudaugher came over.  Was walking, and then he was walking about three miles.  That night, started to hurt some.  Has not gotten that bad.    Put some heat on it last night.  At this point he is not having any radicular symptoms, and his pain is more focal in the musculature at this point.  Review of Systems is noted in the HPI, as appropriate   Objective:   BP 120/84   Pulse 83   Temp (!) 97.3 F (36.3 C) (Temporal)   Ht 4\' 11"  (1.499 m)   Wt 235 lb 8 oz (106.8 kg)   SpO2 95%   BMI 47.57 kg/m    Range of motion at  the waist: Flexion: normal Extension: normal Lateral bending: normal Rotation: all normal  No echymosis or edema Rises to examination table with no difficulty Gait: non antalgic  Inspection/Deformity: N Paraspinus Tenderness: L3-s1  B Ankle  Dorsiflexion (L5,4): 5/5 B Great Toe Dorsiflexion (L5,4): 5/5 Heel Walk (L5): WNL Toe Walk (S1): WNL Rise/Squat (L4): WNL  SENSORY B Medial Foot (L4): WNL B Dorsum (L5): WNL B Lateral (S1): WNL Light Touch: WNL  B SLR, seated: neg B SLR, supine: neg B Greater Troch: NT B Log Roll: neg B Sciatic Notch: NT  Radiology: No results found.  Assessment and Plan:     ICD-10-CM   1. Acute back pain, unspecified back location, unspecified back pain laterality  M54.9    Improved acute back pain.  I think this is all muscular at this point.  Work on basic ROM, advance activity as tolerated.   Below prn  Meds ordered this encounter  Medications  . cyclobenzaprine (FLEXERIL) 10 MG tablet    Sig: Take 0.5-1 tablets (5-10 mg total) by mouth at bedtime as needed for muscle spasms.    Dispense:  30 tablet    Refill:  0  . diclofenac (VOLTAREN) 75 MG EC tablet    Sig: Take 1 tablet (75 mg total) by mouth 2 (two) times daily.    Dispense:  60 tablet    Refill:  1   Medications Discontinued During This Encounter  Medication Reason  . predniSONE (DELTASONE) 20 MG tablet Completed Course  . cyclobenzaprine (FLEXERIL) 10 MG tablet Reorder   No orders of the defined types were placed  in this encounter.   Follow-up: No follow-ups on file.  Signed,  Maud Deed. Yamin Swingler, MD   Outpatient Encounter Medications as of 11/21/2020  Medication Sig  . atorvastatin (LIPITOR) 20 MG tablet TAKE 1 TABLET (20 MG TOTAL) BY MOUTH DAILY. FOR CHOLESTEROL.  . Cholecalciferol (DIALYVITE VITAMIN D 5000) 125 MCG (5000 UT) capsule Take 5,000 Units by mouth daily.  . diclofenac (VOLTAREN) 75 MG EC tablet Take 1 tablet (75 mg total) by mouth 2 (two) times daily.  . fluocinonide (LIDEX) 0.05 % external solution Apply 1 application topically 2 (two) times daily.  . fluticasone (CUTIVATE) 0.05 % cream Apply topically 2 (two) times daily.  [name] leuprolide, 6 Month, (ELIGARD) 45 MG injection Inject 45 mg into the  skin every 6 (six) months.  [name] omeprazole (PRILOSEC) 20 MG capsule Take 20 mg by mouth daily.  . SUMAtriptan (IMITREX) 100 MG tablet TAKE 1 TABLET BY MOUTH AT MIGRAINE ONSET,MAY REPEAT IN 2 HOURS IF HEADACHE PERSISTS/RECURS *DO NOT EXCEED 100MG  IN 24 HOURS**  . tamsulosin (FLOMAX) 0.4 MG CAPS capsule TAKE 1 CAPSULE BY MOUTH EVERY DAY  . vitamin B-12 (CYANOCOBALAMIN) 500 MCG tablet Take 500 mcg by mouth daily.  . vitamin C (ASCORBIC ACID) 250 MG tablet Take 250 mg by mouth daily.  . Zinc 100 MG TABS Take by mouth.  . [DISCONTINUED] cyclobenzaprine (FLEXERIL) 10 MG tablet Take 0.5-1 tablets (5-10 mg total) by mouth at bedtime as needed for muscle spasms.  . cyclobenzaprine (FLEXERIL) 10 MG tablet Take 0.5-1 tablets (5-10 mg total) by mouth at bedtime as needed for muscle spasms.  . [DISCONTINUED] predniSONE (DELTASONE) 20 MG tablet 2 tabs po for 7 days, then 1 tab po for 7 days   No facility-administered encounter medications on file as of 11/21/2020.

## 2020-11-25 ENCOUNTER — Ambulatory Visit: Payer: Self-pay | Admitting: Urology

## 2020-11-27 ENCOUNTER — Ambulatory Visit: Payer: Self-pay | Admitting: Urology

## 2020-11-28 ENCOUNTER — Other Ambulatory Visit: Payer: Self-pay

## 2020-11-28 ENCOUNTER — Ambulatory Visit: Payer: PPO | Admitting: Urology

## 2020-11-28 ENCOUNTER — Encounter: Payer: Self-pay | Admitting: Urology

## 2020-11-28 VITALS — BP 128/82 | HR 90 | Ht 59.0 in | Wt 235.0 lb

## 2020-11-28 DIAGNOSIS — C61 Malignant neoplasm of prostate: Secondary | ICD-10-CM | POA: Diagnosis not present

## 2020-11-28 NOTE — Progress Notes (Signed)
11/28/2020 11:08 AM   Preston Thomas [birth year] [id number]  Referring provider: Pleas [name], NP [place],  [place] [address]  Chief Complaint  Patient presents with  . Prostate Cancer     Urologic history: 1. cT1c prostate cancer-high risk -Biopsy 10/2018, PSA 12.7, 28 g gland -10/12 cores positive Gleason 3+3 to 4+5 -CT/bone scan negative for metastatic disease -tx IMRT + brachytherapy + ADT; brachytherapy 05/2019   HPI: 70 y.o. male presents for prostate cancer follow-up.   Last radiation oncology follow-up August 2021; PSA at that visit 0.15  PSA 09/02/2020 0.11  Last leuprolide injection was 08/12/2020 and scheduled for his final injection May 2022  Stable LUTS which are not bothersome  Denies dysuria, gross hematuria  No flank, abdominal, pelvic pain   PMH: Past Medical History:  Diagnosis Date  . COLONIC POLYPS, HX OF 08/31/2007   Qualifier: Diagnosis of  By: Silvio Pate MD, [facility] Cancer   . DIVERTICULITIS, HX OF 08/31/2007   Qualifier: Diagnosis of  By: Joesph Fillers CMA (AAMA), Ronny Bacon    . DIVERTICULOSIS, COLON 08/31/2007   Qualifier: Diagnosis of  By: Silvio Pate MD, [facility]aird Cancer   . Dyspnea    Related to Covid  . GERD (gastroesophageal reflux disease)   . History of kidney stones   . RENAL CALCULUS, HX OF 08/31/2007   Qualifier: Diagnosis of  By: Joesph Fillers CMA (AAMA), Natasha      Surgical History: Past Surgical History:  Procedure Laterality Date  . APPENDECTOMY  1987  . CHOLECYSTECTOMY  1987  . RADIOACTIVE SEED IMPLANT N/A 06/27/2019   Procedure: RADIOACTIVE SEED IMPLANT/BRACHYTHERAPY IMPLANT;  Surgeon: Abbie Sons, MD;  Location: [facility] ORS;  Service: Urology;  Laterality: N/A;    Home Medications:  Allergies as of 11/28/2020      Reactions   Penicillins    Passed out Did it involve swelling of the face/tongue/throat, SOB, or low BP? Yes Did it involve sudden or severe rash/hives, skin peeling, or any reaction on the inside  of your mouth or nose? No Did you need to seek medical attention at a hospital or doctor's office? No When did it last happen?30 + years If all above answers are "NO", may proceed with cephalosporin use.      Medication List       Accurate as of November 28, 2020 11:08 AM. If you have any questions, ask your nurse or doctor.        atorvastatin 20 MG tablet Commonly known as: LIPITOR TAKE 1 TABLET (20 MG TOTAL) BY MOUTH DAILY. FOR CHOLESTEROL.   cyclobenzaprine 10 MG tablet Commonly known as: FLEXERIL Take 0.5-1 tablets (5-10 mg total) by mouth at bedtime as needed for muscle spasms.   Dialyvite Vitamin D 5000 125 MCG (5000 UT) capsule Generic drug: Cholecalciferol Take 5,000 Units by mouth daily.   diclofenac 75 MG EC tablet Commonly known as: VOLTAREN Take 1 tablet (75 mg total) by mouth 2 (two) times daily.   fluocinonide 0.05 % external solution Commonly known as: LIDEX Apply 1 application topically 2 (two) times daily.   fluticasone 0.05 % cream Commonly known as: CUTIVATE Apply topically 2 (two) times daily.   leuprolide (6 Month) 45 MG injection Commonly known as: ELIGARD Inject 45 mg into the skin every 6 (six) months.   omeprazole 20 MG capsule Commonly known as: PRILOSEC Take 20 mg by mouth daily.   SUMAtriptan 100 MG tablet Commonly known as: IMITREX TAKE 1 TABLET BY MOUTH  AT MIGRAINE ONSET,MAY REPEAT IN 2 HOURS IF HEADACHE PERSISTS/RECURS *DO NOT EXCEED 100MG  IN 24 HOURS**   tamsulosin 0.4 MG Caps capsule Commonly known as: FLOMAX TAKE 1 CAPSULE BY MOUTH EVERY DAY   vitamin B-12 500 MCG tablet Commonly known as: CYANOCOBALAMIN Take 500 mcg by mouth daily.   vitamin C 250 MG tablet Commonly known as: ASCORBIC ACID Take 250 mg by mouth daily.   Zinc 100 MG Tabs Take by mouth.       Allergies:  Allergies  Allergen Reactions  . Penicillins     Passed out Did it involve swelling of the face/tongue/throat, SOB, or low BP? Yes Did it  involve sudden or severe rash/hives, skin peeling, or any reaction on the inside of your mouth or nose? No Did you need to seek medical attention at a hospital or doctor's office? No When did it last happen?30 + years If all above answers are "NO", may proceed with cephalosporin use.     Family History: Family History  Problem Relation Age of Onset  . Early death Father   . Kidney disease Father     Social History:  reports that he quit smoking about 36 years ago. His smoking use included cigarettes. He has a 15.00 pack-year smoking history. He has never used smokeless tobacco. He reports previous alcohol use. He reports that he does not use drugs.   Physical Exam: BP 128/82   Pulse 90   Ht 4\' 11"  (1.499 m)   Wt 235 lb (106.6 kg)   BMI 47.46 kg/m   Constitutional:  Alert and oriented, No acute distress. HEENT: [place] AT, moist mucus membranes.  Trachea midline, no masses. Cardiovascular: No clubbing, cyanosis, or edema. Respiratory: Normal respiratory effort, no increased work of breathing.  Assessment & Plan:    1.  T1c high risk prostate cancer  PSA remains low and stable  Follow-up May 2022 for last leuprolide injection  Scheduled radiation oncology follow-up August 2022  Follow-up with me 1 year   Abbie Sons, MD  [place] [address] [place], [place] [address] [phone]

## 2020-12-04 DIAGNOSIS — H25813 Combined forms of age-related cataract, bilateral: Secondary | ICD-10-CM | POA: Diagnosis not present

## 2020-12-04 DIAGNOSIS — H35371 Puckering of macula, right eye: Secondary | ICD-10-CM | POA: Diagnosis not present

## 2020-12-04 DIAGNOSIS — H53142 Visual discomfort, left eye: Secondary | ICD-10-CM | POA: Diagnosis not present

## 2020-12-04 DIAGNOSIS — H43812 Vitreous degeneration, left eye: Secondary | ICD-10-CM | POA: Diagnosis not present

## 2020-12-04 DIAGNOSIS — Z8546 Personal history of malignant neoplasm of prostate: Secondary | ICD-10-CM | POA: Diagnosis not present

## 2020-12-22 ENCOUNTER — Other Ambulatory Visit: Payer: Self-pay | Admitting: Primary Care

## 2020-12-22 DIAGNOSIS — E785 Hyperlipidemia, unspecified: Secondary | ICD-10-CM

## 2021-01-22 DIAGNOSIS — G4733 Obstructive sleep apnea (adult) (pediatric): Secondary | ICD-10-CM | POA: Diagnosis not present

## 2021-01-30 MED ORDER — CYCLOBENZAPRINE HCL 10 MG PO TABS: 5.0000 mg | ORAL_TABLET | Freq: Every evening | ORAL | 2 refills | Status: DC | PRN

## 2021-01-30 MED ORDER — PREDNISONE 20 MG PO TABS: ORAL_TABLET | ORAL | 0 refills | Status: DC

## 2021-01-30 MED ORDER — DICLOFENAC SODIUM 75 MG PO TBEC: 75.0000 mg | DELAYED_RELEASE_TABLET | Freq: Two times a day (BID) | ORAL | 2 refills | Status: DC

## 2021-02-10 ENCOUNTER — Ambulatory Visit (INDEPENDENT_AMBULATORY_CARE_PROVIDER_SITE_OTHER): Payer: PPO

## 2021-02-10 ENCOUNTER — Other Ambulatory Visit: Payer: Self-pay

## 2021-02-10 DIAGNOSIS — C61 Malignant neoplasm of prostate: Secondary | ICD-10-CM | POA: Diagnosis not present

## 2021-02-10 MED ORDER — LEUPROLIDE ACETATE (6 MONTH) 45 MG ~~LOC~~ KIT
45.0000 mg | PACK | Freq: Once | SUBCUTANEOUS | Status: AC
  Administered 2021-02-10: 45 mg via SUBCUTANEOUS

## 2021-02-10 NOTE — Progress Notes (Signed)
Eligard SubQ Injection   Due to Prostate Cancer patient is present today for a Eligard Injection.  Medication: Eligard 6 month Dose: 45 mg  Location: right  Lot: [id number] Exp: 05/2022  Patient tolerated well, no complications were noted.  Performed [id number] Cerrone Debold, [place]   Per Dr. Bernardo Heater today is patient's final injection. Patient scheduled for follow up with Dr. Bernardo Heater in 1 year per his last office note.

## 2021-03-04 ENCOUNTER — Other Ambulatory Visit: Payer: Self-pay | Admitting: Primary Care

## 2021-03-04 DIAGNOSIS — R7303 Prediabetes: Secondary | ICD-10-CM

## 2021-03-07 ENCOUNTER — Other Ambulatory Visit: Payer: Self-pay | Admitting: *Deleted

## 2021-03-17 ENCOUNTER — Other Ambulatory Visit: Payer: Self-pay

## 2021-03-17 ENCOUNTER — Other Ambulatory Visit (INDEPENDENT_AMBULATORY_CARE_PROVIDER_SITE_OTHER): Payer: PPO

## 2021-03-17 DIAGNOSIS — R7303 Prediabetes: Secondary | ICD-10-CM

## 2021-03-17 LAB — POCT GLYCOSYLATED HEMOGLOBIN (HGB A1C): Hemoglobin A1C: 6.1 % — AB (ref 4.0–5.6)

## 2021-03-26 ENCOUNTER — Other Ambulatory Visit: Payer: Self-pay | Admitting: Urology

## 2021-04-22 DIAGNOSIS — G4733 Obstructive sleep apnea (adult) (pediatric): Secondary | ICD-10-CM | POA: Diagnosis not present

## 2021-04-23 ENCOUNTER — Inpatient Hospital Stay: Payer: PPO | Attending: Radiation Oncology

## 2021-04-23 ENCOUNTER — Other Ambulatory Visit: Payer: Self-pay

## 2021-04-23 DIAGNOSIS — C61 Malignant neoplasm of prostate: Secondary | ICD-10-CM | POA: Diagnosis not present

## 2021-04-23 LAB — PSA: Prostatic Specific Antigen: 0.04 ng/mL (ref 0.00–4.00)

## 2021-04-25 ENCOUNTER — Ambulatory Visit
Admission: RE | Admit: 2021-04-25 | Discharge: 2021-04-25 | Disposition: A | Discharge status: Home / Self Care | Payer: PPO | Source: Ambulatory Visit | Attending: Radiation Oncology | Admitting: Radiation Oncology

## 2021-04-25 ENCOUNTER — Other Ambulatory Visit: Payer: Self-pay

## 2021-04-25 ENCOUNTER — Encounter: Payer: Self-pay | Admitting: Radiation Oncology

## 2021-04-25 VITALS — BP 150/87 | HR 76 | Temp 97.1°F | Wt 230.2 lb

## 2021-04-25 DIAGNOSIS — Z923 Personal history of irradiation: Secondary | ICD-10-CM | POA: Diagnosis not present

## 2021-04-25 DIAGNOSIS — C61 Malignant neoplasm of prostate: Secondary | ICD-10-CM | POA: Diagnosis not present

## 2021-04-25 NOTE — Progress Notes (Signed)
Radiation Oncology Follow up Note  Name: Preston Thomas   Date:   04/25/2021 MRN:  [id number] DOB: 26-Jan-1951    This 70 y.o. male presents to the clinic today for 2-year follow-up status post I-125 interstitial implant for boost in patient treated with both external beam as well as pelvic lymph nodes for Gleason 8 (3+5) adenocarcinoma the prostate presenting with a PSA of 12.7  REFERRING PROVIDER: Pleas [name], NP  HPI: Patient is a 70 year old male now out 2 years having completed both external beam radiation to his prostate and pelvic nodes as well as I-125 interstitial implant for boost for Gleason 8 adenocarcinoma the prostate presenting with a PSA of 12.7.  Seen today in routine follow-up he is doing well specifically denies any increased lower urinary tract symptoms diarrhea or fatigue.  His most recent PSA is 0.04.  He received his last ADT treatment back in May.  COMPLICATIONS OF TREATMENT: none  FOLLOW UP COMPLIANCE: keeps appointments   PHYSICAL EXAM:  BP (!) 150/87   Pulse 76   Temp (!) 97.1 F (36.2 C) (Tympanic)   Wt 230 lb 3.2 oz (104.4 kg)   BMI 46.49 kg/m  Well-developed well-nourished patient in NAD. HEENT reveals PERLA, EOMI, discs not visualized.  Oral cavity is clear. No oral mucosal lesions are identified. Neck is clear without evidence of cervical or supraclavicular adenopathy. Lungs are clear to A&P. Cardiac examination is essentially unremarkable with regular rate and rhythm without murmur rub or thrill. Abdomen is benign with no organomegaly or masses noted. Motor sensory and DTR levels are equal and symmetric in the upper and lower extremities. Cranial nerves II through XII are grossly intact. Proprioception is intact. No peripheral adenopathy or edema is identified. No motor or sensory levels are noted. Crude visual fields are within normal range.  RADIOLOGY RESULTS: No current films for review  PLAN: Present time patient is under excellent  biochemical control of his prostate cancer.  And pleased with his overall progress.  Of asked to see him back in 1 year for follow-up.  He continues close follow-up care with urology.  Patient knows to call with any concerns.  I would like to take this opportunity to thank you for allowing me to participate in the care of your patient.Noreene Filbert, MD

## 2021-04-30 ENCOUNTER — Ambulatory Visit: Payer: PPO | Admitting: Radiation Oncology

## 2021-05-19 ENCOUNTER — Ambulatory Visit
Admission: EM | Admit: 2021-05-19 | Discharge: 2021-05-19 | Disposition: A | Discharge status: Home / Self Care | Payer: PPO | Attending: Emergency Medicine | Admitting: Emergency Medicine

## 2021-05-19 ENCOUNTER — Other Ambulatory Visit: Payer: Self-pay

## 2021-05-19 DIAGNOSIS — H1031 Unspecified acute conjunctivitis, right eye: Secondary | ICD-10-CM | POA: Diagnosis not present

## 2021-05-19 MED ORDER — POLYMYXIN B-TRIMETHOPRIM 10000-0.1 UNIT/ML-% OP SOLN: 1.0000 [drp] | Freq: Four times a day (QID) | OPHTHALMIC | 0 refills | Status: AC

## 2021-05-19 NOTE — ED Triage Notes (Addendum)
Pt states it feels like something is in his eye and that he might have conjuctivitis to right eye. Started: 4 days ago

## 2021-05-19 NOTE — ED Provider Notes (Signed)
[facility]    CSN: [id number] Arrival date & time: 05/19/21  0850      History   Chief Complaint Chief Complaint  Patient presents with   Conjunctivitis    HPI Preston Thomas is a 70 y.o. male history of migraines, GERD, presenting today for evaluation of right eye redness and crusting.  Symptoms have been going on for the past 4 days, slightly worsened this morning.  Watery drainage throughout the day.  Occasional blurry vision, but otherwise no change in vision.  Denies contact use.  Denies injury or trauma.  Denies associated URI symptoms.  HPI  Past Medical History:  Diagnosis Date   COLONIC POLYPS, HX OF 08/31/2007   Qualifier: Diagnosis of  By: Silvio Pate MD, [facility]    DIVERTICULITIS, HX OF 08/31/2007   Qualifier: Diagnosis of  By: Joesph Fillers CMA (AAMA), Natasha     DIVERTICULOSIS, COLON 08/31/2007   Qualifier: Diagnosis of  By: Silvio Pate MD, [facility]    Dyspnea    Related to Covid   GERD (gastroesophageal reflux disease)    History of kidney stones    RENAL CALCULUS, HX OF 08/31/2007   Qualifier: Diagnosis of  By: Joesph Fillers CMA Deborra Medina), Natasha      Patient Active Problem List   Diagnosis Date Noted   Prediabetes 09/12/2020   History of COVID-19 02/21/2020   Shortness of breath 02/21/2020   OSA (obstructive sleep apnea) 02/21/2020   Peripheral edema 09/06/2019   Pneumonia due to COVID-19 virus 04/19/2019   Prostate cancer ([place]) 04/11/2019   Hx of radiation therapy 04/11/2019   GERD (gastroesophageal reflux disease) 09/13/2018   Preventative health care 08/17/2018   Hyperlipidemia 08/31/2007   Migraines 08/31/2007    Past Surgical History:  Procedure Laterality Date   [place] N/A 06/27/2019   Procedure: RADIOACTIVE SEED IMPLANT/BRACHYTHERAPY IMPLANT;  Surgeon: Abbie Sons, MD;  Location: [facility] ORS;  Service: Urology;  Laterality: N/A;       Home Medications    Prior to  Admission medications   Medication Sig Start Date End Date Taking? Authorizing Provider  trimethoprim-polymyxin b (POLYTRIM) ophthalmic solution Place 1-2 drops into the right eye every 6 (six) hours for 7 days. 05/19/21 05/26/21 Yes Zaivion Kundrat C, PA-C  atorvastatin (LIPITOR) 20 MG tablet TAKE 1 TABLET BY MOUTH DAILY. FOR CHOLESTEROL 12/22/20   Pleas [name], NP  Cholecalciferol (DIALYVITE VITAMIN D 5000) 125 MCG (5000 UT) capsule Take 5,000 Units by mouth daily.    [provider]  cyclobenzaprine (FLEXERIL) 10 MG tablet Take 0.5-1 tablets (5-10 mg total) by mouth at bedtime as needed for muscle spasms. 01/30/21 01/30/22  Copland, Frederico Hamman, MD  diclofenac (VOLTAREN) 75 MG EC tablet Take 1 tablet (75 mg total) by mouth 2 (two) times daily. 01/30/21   Copland, Frederico Hamman, MD  fluocinonide (LIDEX) 0.05 % external solution Apply 1 application topically 2 (two) times daily. 09/16/20   Pleas [name], NP  fluticasone (CUTIVATE) 0.05 % cream Apply topically 2 (two) times daily. 09/16/20   Pleas [name], NP  leuprolide, 6 Month, (ELIGARD) 45 MG injection Inject 45 mg into the skin every 6 (six) months.    [provider]  omeprazole (PRILOSEC) 20 MG capsule Take 20 mg by mouth daily.    [provider]  predniSONE (DELTASONE) 20 MG tablet 2 tabs po for 7 days, then 1 tab po for 7 days 01/30/21  Copland, Spencer, MD  SUMAtriptan (IMITREX) 100 MG tablet TAKE 1 TABLET BY MOUTH AT MIGRAINE ONSET,MAY REPEAT IN 2 HOURS IF HEADACHE PERSISTS/RECURS *DO NOT EXCEED '100MG'$  IN 24 HOURS** 07/10/20   Pleas [name], NP  tamsulosin (FLOMAX) 0.4 MG CAPS capsule TAKE 1 CAPSULE BY MOUTH ONCE DAILY 03/26/21   Stoioff, Ronda Fairly, MD  vitamin B-12 (CYANOCOBALAMIN) 500 MCG tablet Take 500 mcg by mouth daily.    [provider]  vitamin C (ASCORBIC ACID) 250 MG tablet Take 250 mg by mouth daily.    [provider]  Zinc 100 MG TABS Take by mouth.    [provider]     Family History Family History  Problem Relation Age of Onset   Early death Father    Kidney disease Father     Social History Social History   Tobacco Use   Smoking status: Former    Packs/day: 1.50    Years: 10.00    Pack years: 15.00    Types: Cigarettes    Quit date: 09/28/1984    Years since quitting: 36.6   Smokeless tobacco: Never  Vaping Use   Vaping Use: Never used  Substance Use Topics   Alcohol use: Not Currently   Drug use: Never     Allergies   Penicillins   Review of Systems Review of Systems  Constitutional:  Negative for activity change, appetite change, chills, fatigue and fever.  HENT:  Negative for congestion, ear pain, rhinorrhea, sinus pressure, sore throat and trouble swallowing.   Eyes:  Positive for discharge and redness.  Respiratory:  Negative for cough, chest tightness and shortness of breath.   Cardiovascular:  Negative for chest pain.  Gastrointestinal:  Negative for abdominal pain, diarrhea, nausea and vomiting.  Musculoskeletal:  Negative for myalgias.  Skin:  Negative for rash.  Neurological:  Negative for dizziness, light-headedness and headaches.    Physical Exam Triage Vital Signs ED Triage Vitals  Enc Vitals Group     BP      Pulse      Resp      Temp      Temp src      SpO2      Weight      Height      Head Circumference      Peak Flow      Pain Score      Pain Loc      Pain Edu?      Excl. in [place]?    No data found.  Updated Vital Signs BP (!) 146/96 (BP Location: Right Arm)   Pulse 86   Temp 98 F (36.7 C) (Oral)   Resp 18   SpO2 97%   Visual Acuity Right Eye Distance:   Left Eye Distance:   Bilateral Distance:    Right Eye Near:   Left Eye Near:    Bilateral Near:     Physical Exam Vitals and nursing note reviewed.  Constitutional:      Appearance: He is well-developed.     Comments: No acute distress  HENT:     Head: Normocephalic and atraumatic.     Nose: Nose normal.  Eyes:      Pupils: Pupils are equal, round, and reactive to light.     Comments: Right eye with conjunctival erythema, anterior chamber clear, no photophobia with exam, no fluorescein uptake  Cardiovascular:     Rate and Rhythm: Normal rate.  Pulmonary:     Effort: Pulmonary  effort is normal. No respiratory distress.  Abdominal:     General: There is no distension.  Musculoskeletal:        General: Normal range of motion.     Cervical back: Neck supple.  Skin:    General: Skin is warm and dry.  Neurological:     Mental Status: He is alert and oriented to person, place, and time.     UC Treatments / Results  Labs (all labs ordered are listed, but only abnormal results are displayed) Labs Reviewed - No data to display  EKG   Radiology No results found.  Procedures Procedures (including critical care time)  Medications Ordered in UC Medications - No data to display  Initial Impression / Assessment and Plan / UC Course  I have reviewed the triage vital signs and the nursing notes.  Pertinent labs & imaging results that were available during my care of the patient were reviewed by me and considered in my medical decision making (see chart for details).     Treating for bacterial conjunctivitis, no sign of abrasion or ulcer, no red flags, treating with Polytrim warm compresses and continued close monitoring.  Discussed strict return precautions. Patient verbalized understanding and is agreeable with plan.  Final Clinical Impressions(s) / UC Diagnoses   Final diagnoses:  Acute bacterial conjunctivitis of right eye     Discharge Instructions      Use Polytrim eyedrops 4 times daily x1 week Warm compresses Avoid touching eye Have good hand hygiene Wash linens Please follow-up if developing any worsening symptoms, vision changes, eye pain, light sensitivity, eye swelling      ED Prescriptions     Medication Sig Dispense Auth. Provider   trimethoprim-polymyxin b  (POLYTRIM) ophthalmic solution Place 1-2 drops into the right eye every 6 (six) hours for 7 days. 10 mL Azaylah Stailey, [place] C, PA-C      PDMP not reviewed this encounter.   Janith Lima, PA-C 05/19/21 1106

## 2021-05-19 NOTE — Discharge Instructions (Addendum)
Use Polytrim eyedrops 4 times daily x1 week Warm compresses Avoid touching eye Have good hand hygiene Wash linens Please follow-up if developing any worsening symptoms, vision changes, eye pain, light sensitivity, eye swelling

## 2021-06-18 DIAGNOSIS — H25813 Combined forms of age-related cataract, bilateral: Secondary | ICD-10-CM | POA: Diagnosis not present

## 2021-06-18 DIAGNOSIS — H53143 Visual discomfort, bilateral: Secondary | ICD-10-CM | POA: Diagnosis not present

## 2021-06-18 DIAGNOSIS — H5319 Other subjective visual disturbances: Secondary | ICD-10-CM | POA: Diagnosis not present

## 2021-06-18 DIAGNOSIS — H35371 Puckering of macula, right eye: Secondary | ICD-10-CM | POA: Diagnosis not present

## 2021-06-21 ENCOUNTER — Other Ambulatory Visit: Payer: Self-pay | Admitting: Primary Care

## 2021-06-21 DIAGNOSIS — E785 Hyperlipidemia, unspecified: Secondary | ICD-10-CM

## 2021-06-26 ENCOUNTER — Encounter: Payer: Self-pay | Admitting: Primary Care

## 2021-06-26 ENCOUNTER — Other Ambulatory Visit: Payer: Self-pay

## 2021-06-26 ENCOUNTER — Ambulatory Visit (INDEPENDENT_AMBULATORY_CARE_PROVIDER_SITE_OTHER): Payer: PPO | Admitting: Primary Care

## 2021-06-26 VITALS — BP 140/88 | HR 110 | Temp 98.6°F | Ht 59.0 in | Wt 226.0 lb

## 2021-06-26 DIAGNOSIS — L409 Psoriasis, unspecified: Secondary | ICD-10-CM

## 2021-06-26 MED ORDER — PREDNISONE 20 MG PO TABS: ORAL_TABLET | ORAL | 0 refills | Status: DC

## 2021-06-26 MED ORDER — CLOBETASOL PROPIONATE 0.05 % EX OINT: 1.0000 "application " | TOPICAL_OINTMENT | Freq: Two times a day (BID) | CUTANEOUS | 0 refills | Status: DC

## 2021-06-26 NOTE — Progress Notes (Signed)
Subjective:    Patient ID: Preston Thomas, male    DOB: 01/19/1951, 70 y.o.   MRN: [id number]  HPI  Preston Thomas is a very pleasant 70 y.o. male with a history of psoriasis, prostate cancer, prediabetes who presents today to discuss psoriasis flare.  Chronic psoriasis to the groin which is constant and overall controlled. About two weeks ago he developed a "break in the skin" to the left axilla, and a breakout of his typical psoriasis lesions to the anterior and posterior trunk, groin, and upper extremities. Over the last two weeks the rash to his left axilla has been progressing since. Symptoms include redness and burning.   He's tried applying Vaseline and triamcinolone 0.025% without improvement to the rash to his left axilla.  Currently prescribed fluocinonide 0.05% solution, fluticasone 0.05% and triamcinolone 0.025% cream for psoriasis.  He has not seen dermatology in over 10 years.     Review of Systems  Constitutional:  Negative for fever.  Skin:  Positive for color change and rash.        Past Medical History:  Diagnosis Date   COLONIC POLYPS, HX OF 08/31/2007   Qualifier: Diagnosis of  By: Silvio Pate MD, [facility]    DIVERTICULITIS, HX OF 08/31/2007   Qualifier: Diagnosis of  By: Joesph Fillers CMA (AAMA), Natasha     DIVERTICULOSIS, COLON 08/31/2007   Qualifier: Diagnosis of  By: Silvio Pate MD, [facility]    Dyspnea    Related to Covid   GERD (gastroesophageal reflux disease)    History of kidney stones    RENAL CALCULUS, HX OF 08/31/2007   Qualifier: Diagnosis of  By: Joesph Fillers CMA (AAMA), Natasha      Social History   Socioeconomic History   Marital status: Married    Spouse name: Not on file   Number of children: Not on file   Years of education: Not on file   Highest education level: Not on file  Occupational History   Not on file  Tobacco Use   Smoking status: Former    Packs/day: 1.50    Years: 10.00    Pack years: 15.00    Types:  Cigarettes    Quit date: 09/28/1984    Years since quitting: 36.7   Smokeless tobacco: Never  Vaping Use   Vaping Use: Never used  Substance and Sexual Activity   Alcohol use: Not Currently   Drug use: Never   Sexual activity: Yes  Other Topics Concern   Not on file  Social History Narrative   Not on file   Social Determinants of Health   Financial Resource Strain: Low Risk    Difficulty of Paying Living Expenses: Not hard at all  Food Insecurity: No Food Insecurity   Worried About Charity fundraiser in the Last Year: Never true   [place] in the Last Year: Never true  Transportation Needs: No Transportation Needs   Lack of Transportation (Medical): No   Lack of Transportation (Non-Medical): No  Physical Activity: Inactive   Days of Exercise per Week: 0 days   Minutes of Exercise per Session: 0 min  Stress: No Stress Concern Present   Feeling of Stress : Not at all  Social Connections: Not on file  Intimate Partner Violence: Not At Risk   Fear of Current or Ex-Partner: No   Emotionally Abused: No   Physically Abused: No   Sexually Abused: No    Past Surgical History:  Procedure  Laterality Date   [place]   RADIOACTIVE SEED IMPLANT N/A 06/27/2019   Procedure: RADIOACTIVE SEED IMPLANT/BRACHYTHERAPY IMPLANT;  Surgeon: Abbie Sons, MD;  Location: [facility] ORS;  Service: Urology;  Laterality: N/A;    Family History  Problem Relation Age of Onset   Early death Father    Kidney disease Father     Allergies  Allergen Reactions   Penicillins     Passed out Did it involve swelling of the face/tongue/throat, SOB, or low BP? Yes Did it involve sudden or severe rash/hives, skin peeling, or any reaction on the inside of your mouth or nose? No Did you need to seek medical attention at a hospital or doctor's office? No When did it last happen?      30 + years If all above answers are "NO", may proceed with cephalosporin use.      Current Outpatient Medications on File Prior to Visit  Medication Sig Dispense Refill   atorvastatin (LIPITOR) 20 MG tablet TAKE 1 TABLET BY MOUTH DAILY. FOR CHOLESTEROL 90 tablet 2   Cholecalciferol (DIALYVITE VITAMIN D 5000) 125 MCG (5000 UT) capsule Take 5,000 Units by mouth daily.     fluocinonide (LIDEX) 0.05 % external solution Apply 1 application topically 2 (two) times daily. 60 mL 0   fluticasone (CUTIVATE) 0.05 % cream Apply topically 2 (two) times daily. 30 g 0   leuprolide, 6 Month, (ELIGARD) 45 MG injection Inject 45 mg into the skin every 6 (six) months.     omeprazole (PRILOSEC) 20 MG capsule Take 20 mg by mouth daily.     SUMAtriptan (IMITREX) 100 MG tablet TAKE 1 TABLET BY MOUTH AT MIGRAINE ONSET,MAY REPEAT IN 2 HOURS IF HEADACHE PERSISTS/RECURS *DO NOT EXCEED 100MG  IN 24 HOURS** 10 tablet 0   tamsulosin (FLOMAX) 0.4 MG CAPS capsule TAKE 1 CAPSULE BY MOUTH ONCE DAILY 90 capsule 2   triamcinolone (KENALOG) 0.025 % cream Apply 1 application topically 2 (two) times daily.     vitamin B-12 (CYANOCOBALAMIN) 500 MCG tablet Take 500 mcg by mouth daily.     vitamin C (ASCORBIC ACID) 250 MG tablet Take 250 mg by mouth daily.     cyclobenzaprine (FLEXERIL) 10 MG tablet Take 0.5-1 tablets (5-10 mg total) by mouth at bedtime as needed for muscle spasms. (Patient not taking: Reported on 06/26/2021) 30 tablet 2   diclofenac (VOLTAREN) 75 MG EC tablet Take 1 tablet (75 mg total) by mouth 2 (two) times daily. (Patient not taking: Reported on 06/26/2021) 60 tablet 2   Zinc 100 MG TABS Take by mouth. (Patient not taking: Reported on 06/26/2021)     No current facility-administered medications on file prior to visit.    BP 140/88   Pulse (!) 110   Temp 98.6 F (37 C) (Temporal)   Ht 4\' 11"  (1.499 m)   Wt 226 lb (102.5 kg)   SpO2 96%   BMI 45.65 kg/m  Objective:   Physical Exam Skin:    General: Skin is warm.     Findings: Erythema and rash present.     Comments: Numerous red,  raised, bumps to anterior and posterior trunk, bilateral upper extremities, bilateral groin, buttocks.  Some of these lesions are plaque-like.  Significant rash to left axilla with skin breakdown.  See pictures  Neurological:     Mental Status: He is alert.             Assessment & Plan:  This visit occurred during the SARS-CoV-2 public health emergency.  Safety protocols were in place, including screening questions prior to the visit, additional usage of staff PPE, and extensive cleaning of exam room while observing appropriate contact time as indicated for disinfecting solutions.

## 2021-06-26 NOTE — Patient Instructions (Signed)
Start prednisone tablets. Take 2 tablets by mouth once daily for five days, then 1 tablet once daily for five days.  Use the clobetasol ointment after you complete the prednisone if the prednisone helps.  You will be contacted regarding your referral to dermatology.  Please let us know if you have not been contacted within two weeks.   It was a pleasure to see you today!

## 2021-06-26 NOTE — Assessment & Plan Note (Signed)
Chronic for years, significant flare noted today.  Prescription for prednisone taper sent to pharmacy.  Prescription for clobetasol ointment to use under left axilla sent to pharmacy.  Referral placed to dermatology.  He will update if no improvement.

## 2021-06-27 ENCOUNTER — Ambulatory Visit: Payer: PPO | Admitting: Primary Care

## 2021-07-14 DIAGNOSIS — L304 Erythema intertrigo: Secondary | ICD-10-CM | POA: Diagnosis not present

## 2021-07-14 DIAGNOSIS — L218 Other seborrheic dermatitis: Secondary | ICD-10-CM | POA: Diagnosis not present

## 2021-09-02 DIAGNOSIS — G4733 Obstructive sleep apnea (adult) (pediatric): Secondary | ICD-10-CM | POA: Diagnosis not present

## 2021-09-10 NOTE — Progress Notes (Signed)
Subjective:   Preston Thomas is a 70 y.o. male who presents for Medicare Annual/Subsequent preventive examination.  I connected with Preston Thomas today by telephone and verified that I am speaking with the correct person using two identifiers. Location patient: home Location provider: work Persons participating in the virtual visit: patient, [name].    I discussed the limitations, risks, security and privacy concerns of performing an evaluation and management service by telephone and the availability of in person appointments. I also discussed with the patient that there may be a patient responsible charge related to this service. The patient expressed understanding and verbally consented to this telephonic visit.    Interactive audio and video telecommunications were attempted between this provider and patient, however failed, due to patient having technical difficulties OR patient did not have access to video capability.  We continued and completed visit with audio only.  Some vital signs may be absent or patient reported.   Time Spent with patient on telephone encounter: 20 minutes  Review of Systems     Cardiac Risk Factors include: advanced age (>77men, >48 women);dyslipidemia     Objective:    Today's Vitals   09/12/21 0815  Weight: 226 lb (102.5 kg)  Height: 4\' 11"  (1.499 m)   Body mass index is 45.65 kg/m.  Advanced Directives 09/12/2021 09/10/2020 04/29/2020 10/30/2019 07/26/2019 06/27/2019 06/07/2019  Does Patient Have a Medical Advance Directive? Yes Yes No Yes Yes Yes Yes  Type of Paramedic of [place];Living will [place];Living will - [place];Living will [place];Living will Living will Living will;Healthcare Power of Attorney  Does patient want to make changes to medical advance directive? Yes (MAU/Ambulatory/Procedural Areas - Information given) - - No - Patient declined  No - Patient declined No - Patient declined -  Copy of [place] in Chart? - No - copy requested - No - copy requested No - copy requested - -  Would patient like information on creating a medical advance directive? - - No - Patient declined - - - -    Current Medications (verified) Outpatient Encounter Medications as of 09/12/2021  Medication Sig   atorvastatin (LIPITOR) 20 MG tablet TAKE 1 TABLET BY MOUTH DAILY. FOR CHOLESTEROL   Cholecalciferol (DIALYVITE VITAMIN D 5000) 125 MCG (5000 UT) capsule Take 5,000 Units by mouth daily.   clobetasol ointment (TEMOVATE) 6.57 % Apply 1 application topically 2 (two) times daily.   fluocinonide (LIDEX) 0.05 % external solution Apply 1 application topically 2 (two) times daily.   fluticasone (CUTIVATE) 0.05 % cream Apply topically 2 (two) times daily.   leuprolide, 6 Month, (ELIGARD) 45 MG injection Inject 45 mg into the skin every 6 (six) months.   omeprazole (PRILOSEC) 20 MG capsule Take 20 mg by mouth daily.   predniSONE (DELTASONE) 20 MG tablet Take 2 tablets by mouth once daily for five days, then 1 tablet once daily for five days.   SUMAtriptan (IMITREX) 100 MG tablet TAKE 1 TABLET BY MOUTH AT MIGRAINE ONSET,MAY REPEAT IN 2 HOURS IF HEADACHE PERSISTS/RECURS *DO NOT EXCEED 100MG  IN 24 HOURS**   tamsulosin (FLOMAX) 0.4 MG CAPS capsule TAKE 1 CAPSULE BY MOUTH ONCE DAILY   triamcinolone (KENALOG) 0.025 % cream Apply 1 application topically 2 (two) times daily.   vitamin B-12 (CYANOCOBALAMIN) 500 MCG tablet Take 500 mcg by mouth daily.   vitamin C (ASCORBIC ACID) 250 MG tablet Take 250 mg by mouth  daily.   cyclobenzaprine (FLEXERIL) 10 MG tablet Take 0.5-1 tablets (5-10 mg total) by mouth at bedtime as needed for muscle spasms. (Patient not taking: Reported on 06/26/2021)   diclofenac (VOLTAREN) 75 MG EC tablet Take 1 tablet (75 mg total) by mouth 2 (two) times daily. (Patient not taking: Reported on 06/26/2021)   Zinc 100 MG TABS Take  by mouth. (Patient not taking: Reported on 06/26/2021)   No facility-administered encounter medications on file as of 09/12/2021.    Allergies (verified) Penicillins   History: Past Medical History:  Diagnosis Date   COLONIC POLYPS, HX OF 08/31/2007   Qualifier: Diagnosis of  By: Silvio Pate MD, [facility]    DIVERTICULITIS, HX OF 08/31/2007   Qualifier: Diagnosis of  By: Joesph Fillers CMA (AAMA), Natasha     DIVERTICULOSIS, COLON 08/31/2007   Qualifier: Diagnosis of  By: Silvio Pate MD, [facility]    Dyspnea    Related to Covid   GERD (gastroesophageal reflux disease)    History of kidney stones    RENAL CALCULUS, HX OF 08/31/2007   Qualifier: Diagnosis of  By: Joesph Fillers CMA (AAMA), Ronny Bacon     Past Surgical History:  Procedure Laterality Date   [place]   RADIOACTIVE SEED IMPLANT N/A 06/27/2019   Procedure: RADIOACTIVE SEED IMPLANT/BRACHYTHERAPY IMPLANT;  Surgeon: Abbie Sons, MD;  Location: [facility] ORS;  Service: Urology;  Laterality: N/A;   Family History  Problem Relation Age of Onset   Early death Father    Kidney disease Father    Social History   Socioeconomic History   Marital status: Married    Spouse name: Not on file   Number of children: Not on file   Years of education: Not on file   Highest education level: Not on file  Occupational History   Not on file  Tobacco Use   Smoking status: Former    Packs/day: 1.50    Years: 10.00    Pack years: 15.00    Types: Cigarettes    Quit date: 09/28/1984    Years since quitting: 36.9   Smokeless tobacco: Never  Vaping Use   Vaping Use: Never used  Substance and Sexual Activity   Alcohol use: Not Currently   Drug use: Never   Sexual activity: Yes  Other Topics Concern   Not on file  Social History Narrative   Not on file   Social Determinants of Health   Financial Resource Strain: Low Risk    Difficulty of Paying Living Expenses: Not hard at all  Food Insecurity: No Food Insecurity    Worried About Charity fundraiser in the Last Year: Never true   [place] in the Last Year: Never true  Transportation Needs: No Transportation Needs   Lack of Transportation (Medical): No   Lack of Transportation (Non-Medical): No  Physical Activity: Sufficiently Active   Days of Exercise per Week: 4 days   Minutes of Exercise per Session: 50 min  Stress: No Stress Concern Present   Feeling of Stress : Not at all  Social Connections: Socially Integrated   Frequency of Communication with Friends and Family: More than three times a week   Frequency of Social Gatherings with Friends and Family: More than three times a week   Attends Religious Services: More than 4 times per year   Active Member of [employer] or Organizations: Yes   Attends Archivist Meetings: More than 4 times per year  Marital Status: Married    Tobacco Counseling Counseling given: Not Answered   Clinical Intake:  Pre-visit preparation completed: Yes  Pain : No/denies pain     BMI - recorded: 45.65 Nutritional Status: BMI > 30  Obese Nutritional Risks: Unintentional weight loss (water weight from medication for cancer tx) Diabetes: No  How often do you need to have someone help you when you read instructions, pamphlets, or other written materials from your doctor or pharmacy?: 1 - Never  Diabetic? No  Interpreter Needed?: No  Information entered by :: Orrin Brigham LPN   Activities of Daily Living In your present state of health, do you have any difficulty performing the following activities: 09/12/2021  Hearing? N  Vision? N  Difficulty concentrating or making decisions? N  Walking or climbing stairs? N  Dressing or bathing? N  Doing errands, shopping? N  Preparing Food and eating ? N  Using the Toilet? N  In the past six months, have you accidently leaked urine? N  Do you have problems with loss of bowel control? N  Managing your Medications? N  Managing your Finances? N   Housekeeping or managing your Housekeeping? N  Some recent data might be hidden    Patient Care Team: Pleas [name], NP as PCP - General (Internal Medicine)  Indicate any recent Medical Services you may have received from other than [facility] providers in the past year (date may be approximate).     Assessment:   This is a routine wellness examination for [place].  Hearing/Vision screen Hearing Screening - Comments:: No issues Vision Screening - Comments:: Last exam 08/2021, Dr. from [place] issues and exercise activities discussed: Current Exercise Habits: Home exercise routine, Type of exercise: walking, Time (Minutes): 45, Frequency (Times/Week): 5, Weekly Exercise (Minutes/Week): 225, Intensity: Moderate   Goals Addressed             This Visit's Progress    Patient Stated       Would like to drink more water, eat healthier and start walking       Depression Screen PHQ 2/9 Scores 09/12/2021 09/12/2020 09/10/2020  PHQ - 2 Score 0 0 0  PHQ- 9 Score - 0 0    Fall Risk Fall Risk  09/12/2021 09/12/2020 09/10/2020  Falls in the past year? 0 0 0  Number falls in past yr: 0 0 0  Injury with Fall? 0 0 0  Risk for fall due to : No Fall Risks - No Fall Risks  Follow up Falls prevention discussed - Falls evaluation completed;Falls prevention discussed    FALL RISK PREVENTION PERTAINING TO THE HOME:  Any stairs in or around the home? Yes  If so, are there any without handrails? Yes  Home free of loose throw rugs in walkways, pet beds, electrical cords, etc? Yes  Adequate lighting in your home to reduce risk of falls? Yes   ASSISTIVE DEVICES UTILIZED TO PREVENT FALLS:  Life alert? No  Use of a cane, walker or w/c? No  Grab bars in the bathroom? Yes  Shower chair or bench in shower? No  Elevated toilet seat or a handicapped toilet? No   TIMED UP AND GO:  Was the test performed? No , visit completed over the phone.    Cognitive  Function: Normal cognitive status assessed by this Nurse Health Advisor. No abnormalities found.   MMSE - Mini Mental State Exam 09/10/2020  Orientation to time 5  Orientation to Place 5  Registration 3  Attention/ Calculation 5  Recall 3  Language- repeat 1        Immunizations Immunization History  Administered Date(s) Administered   Fluad Quad(high Dose 65+) 09/07/2019   Influenza,inj,Quad PF,6+ Mos 08/17/2018   PFIZER(Purple Top)SARS-COV-2 Vaccination 11/10/2019, 12/05/2019   Pneumococcal Polysaccharide-23 08/17/2018   Td 04/26/2003    TDAP status: Due, Education has been provided regarding the importance of this vaccine. Advised may receive this vaccine at local pharmacy or Health Dept. Aware to provide a copy of the vaccination record if obtained from local pharmacy or Health Dept. Verbalized acceptance and understanding.  Flu Vaccine status: Declined, Education has been provided regarding the importance of this vaccine but patient still declined. Advised may receive this vaccine at local pharmacy or Health Dept. Aware to provide a copy of the vaccination record if obtained from local pharmacy or Health Dept. Verbalized acceptance and understanding.  Pneumococcal vaccine status: Up to date  Covid-19 vaccine status: Information provided on how to obtain vaccines.   Qualifies for Shingles Vaccine? Yes   Zostavax completed No   Shingrix Completed?: No.    Education has been provided regarding the importance of this vaccine. Patient has been advised to call insurance company to determine out of pocket expense if they have not yet received this vaccine. Advised may also receive vaccine at local pharmacy or Health Dept. Verbalized acceptance and understanding.  Screening Tests Health Maintenance  Topic Date Due   Zoster Vaccines- Shingrix (1 of 2) Never done   Pneumonia Vaccine 40+ Years old (2 - PCV) 08/18/2019   COVID-19 Vaccine (3 - Pfizer risk series) 01/02/2020    INFLUENZA VACCINE  12/26/2021 (Originally 04/28/2021)   TETANUS/TDAP  09/10/2024 (Originally 04/25/2013)   COLONOSCOPY (Pts 45-8yrs Insurance coverage will need to be confirmed)  09/28/2026   Hepatitis C Screening  Completed   HPV VACCINES  Aged Out    Health Maintenance  Health Maintenance Due  Topic Date Due   Zoster Vaccines- Shingrix (1 of 2) Never done   Pneumonia Vaccine 63+ Years old (2 - PCV) 08/18/2019   COVID-19 Vaccine (3 - Pfizer risk series) 01/02/2020    Colorectal cancer screening: Type of screening: Colonoscopy. Completed 09/28/16. Repeat every 10 years  Lung Cancer Screening: (Low Dose CT Chest recommended if Age 71-80 years, 30 pack-year currently smoking OR have quit w/in 15years.) does not qualify.     Additional Screening:  Hepatitis C Screening: does qualify; Completed 08/17/18  Vision Screening: Recommended annual ophthalmology exams for early detection of glaucoma and other disorders of the eye. Is the patient up to date with their annual eye exam?  Yes  Who is the provider or what is the name of the office in which the patient attends annual eye exams? Provider at [facility] Screening: Recommended annual dental exams for proper oral hygiene  Community Resource Referral / Chronic Care Management: CRR required this visit?  No   CCM required this visit?  No      Plan:     I have personally reviewed and noted the following in the patients chart:   Medical and social history Use of alcohol, tobacco or illicit drugs  Current medications and supplements including opioid prescriptions. Patient is not currently taking opioid prescriptions. Functional ability and status Nutritional status Physical activity Advanced directives List of other physicians Hospitalizations, surgeries, and ER visits in previous 12 months Vitals Screenings to include cognitive, depression, and falls Referrals and appointments  In addition,  I have reviewed and  discussed with patient certain preventive protocols, quality metrics, and best practice recommendations. A written personalized care plan for preventive services as well as general preventive health recommendations were provided to patient.   Due to this being a telephonic visit, the after visit summary with patients personalized plan was offered to patient via mail or my-chart. Patient would like to access on my-chart.    Loma Messing, LPN   [id number]    Nurse Health Advisor  Nurse Notes: none

## 2021-09-12 ENCOUNTER — Other Ambulatory Visit: Payer: Self-pay

## 2021-09-12 ENCOUNTER — Ambulatory Visit (INDEPENDENT_AMBULATORY_CARE_PROVIDER_SITE_OTHER): Payer: PPO

## 2021-09-12 VITALS — Ht 59.0 in | Wt 226.0 lb

## 2021-09-12 DIAGNOSIS — Z0000 Encounter for general adult medical examination without abnormal findings: Secondary | ICD-10-CM | POA: Diagnosis not present

## 2021-09-12 NOTE — Patient Instructions (Addendum)
Preston Thomas , Thank you for taking time to complete your Medicare Wellness Visit. I appreciate your ongoing commitment to your health goals. Please review the following plan we discussed and let me know if I can assist you in the future.   Screening recommendations/referrals: Colonoscopy: up to date, completed 09/28/16, due 09/28/26 Recommended yearly ophthalmology/optometry visit for glaucoma screening and checkup Recommended yearly dental visit for hygiene and checkup  Vaccinations: Influenza vaccine: Declined today, please call office or local pharmacy to schedule if you change your mind Pneumococcal vaccine: up to date , completed pneumovax 23, discuss prevnar 13 with PCP Tdap vaccine: Due-May obtain vaccine at your local pharmacy. Shingles vaccine: Discuss with your local pharmacy   Covid-19:  newest booster available at your local pharmacy  Advanced directives: Please bring a copy of Living Will and/or [place] for your chart.   Conditions/risks identified: see problem list  Next appointment: Follow up in one year for your annual wellness visit. 12/18//23 @ 9:45am, this will be a telephone visit.   Preventive Care 68 Years and Older, Male Preventive care refers to lifestyle choices and visits with your health care provider that can promote health and wellness. What does preventive care include? A yearly physical exam. This is also called an annual well check. Dental exams once or twice a year. Routine eye exams. Ask your health care provider how often you should have your eyes checked. Personal lifestyle choices, including: Daily care of your teeth and gums. Regular physical activity. Eating a healthy diet. Avoiding tobacco and drug use. Limiting alcohol use. Practicing safe sex. Taking low doses of aspirin every day. Taking vitamin and mineral supplements as recommended by your health care provider. What happens during an annual well check? The services and  screenings done by your health care provider during your annual well check will depend on your age, overall health, lifestyle risk factors, and family history of disease. Counseling  Your health care provider may ask you questions about your: Alcohol use. Tobacco use. Drug use. Emotional well-being. Home and relationship well-being. Sexual activity. Eating habits. History of falls. Memory and ability to understand (cognition). Work and work Statistician. Screening  You may have the following tests or measurements: Height, weight, and BMI. Blood pressure. Lipid and cholesterol levels. These may be checked every 5 years, or more frequently if you are over 68 years old. Skin check. Lung cancer screening. You may have this screening every year starting at age 68 if you have a 30-pack-year history of smoking and currently smoke or have quit within the past 15 years. Fecal occult blood test (FOBT) of the stool. You may have this test every year starting at age 68. Flexible sigmoidoscopy or colonoscopy. You may have a sigmoidoscopy every 5 years or a colonoscopy every 10 years starting at age 68. Prostate cancer screening. Recommendations will vary depending on your family history and other risks. Hepatitis C blood test. Hepatitis B blood test. Sexually transmitted disease (STD) testing. Diabetes screening. This is done by checking your blood sugar (glucose) after you have not eaten for a while (fasting). You may have this done every 1-3 years. Abdominal aortic aneurysm (AAA) screening. You may need this if you are a current or former smoker. Osteoporosis. You may be screened starting at age 75 if you are at high risk. Talk with your health care provider about your test results, treatment options, and if necessary, the need for more tests. Vaccines  Your health care provider may  recommend certain vaccines, such as: Influenza vaccine. This is recommended every year. Tetanus, diphtheria, and  acellular pertussis (Tdap, Td) vaccine. You may need a Td booster every 10 years. Zoster vaccine. You may need this after age 53. Pneumococcal 13-valent conjugate (PCV13) vaccine. One dose is recommended after age 5. Pneumococcal polysaccharide (PPSV23) vaccine. One dose is recommended after age 35. Talk to your health care provider about which screenings and vaccines you need and how often you need them. This information is not intended to replace advice given to you by your health care provider. Make sure you discuss any questions you have with your health care provider. Document Released: 10/11/2015 Document Revised: 06/03/2016 Document Reviewed: 07/16/2015 Elsevier Interactive Patient Education  2017 [place] Prevention in the Home Falls can cause injuries. They can happen to people of all ages. There are many things you can do to make your home safe and to help prevent falls. What can I do on the outside of my home? Regularly fix the edges of walkways and driveways and fix any cracks. Remove anything that might make you trip as you walk through a door, such as a raised step or threshold. Trim any bushes or trees on the path to your home. Use bright outdoor lighting. Clear any walking paths of anything that might make someone trip, such as rocks or tools. Regularly check to see if handrails are loose or broken. Make sure that both sides of any steps have handrails. Any raised decks and porches should have guardrails on the edges. Have any leaves, snow, or ice cleared regularly. Use sand or salt on walking paths during winter. Clean up any spills in your garage right away. This includes oil or grease spills. What can I do in the bathroom? Use night lights. Install grab bars by the toilet and in the tub and shower. Do not use towel bars as grab bars. Use non-skid mats or decals in the tub or shower. If you need to sit down in the shower, use a plastic, non-slip stool. Keep  the floor dry. Clean up any water that spills on the floor as soon as it happens. Remove soap buildup in the tub or shower regularly. Attach bath mats securely with double-sided non-slip rug tape. Do not have throw rugs and other things on the floor that can make you trip. What can I do in the bedroom? Use night lights. Make sure that you have a light by your bed that is easy to reach. Do not use any sheets or blankets that are too big for your bed. They should not hang down onto the floor. Have a firm chair that has side arms. You can use this for support while you get dressed. Do not have throw rugs and other things on the floor that can make you trip. What can I do in the kitchen? Clean up any spills right away. Avoid walking on wet floors. Keep items that you use a lot in easy-to-reach places. If you need to reach something above you, use a strong step stool that has a grab bar. Keep electrical cords out of the way. Do not use floor polish or wax that makes floors slippery. If you must use wax, use non-skid floor wax. Do not have throw rugs and other things on the floor that can make you trip. What can I do with my stairs? Do not leave any items on the stairs. Make sure that there are handrails on both sides of  the stairs and use them. Fix handrails that are broken or loose. Make sure that handrails are as long as the stairways. Check any carpeting to make sure that it is firmly attached to the stairs. Fix any carpet that is loose or worn. Avoid having throw rugs at the top or bottom of the stairs. If you do have throw rugs, attach them to the floor with carpet tape. Make sure that you have a light switch at the top of the stairs and the bottom of the stairs. If you do not have them, ask someone to add them for you. What else can I do to help prevent falls? Wear shoes that: Do not have high heels. Have rubber bottoms. Are comfortable and fit you well. Are closed at the toe. Do not  wear sandals. If you use a stepladder: Make sure that it is fully opened. Do not climb a closed stepladder. Make sure that both sides of the stepladder are locked into place. Ask someone to hold it for you, if possible. Clearly mark and make sure that you can see: Any grab bars or handrails. First and last steps. Where the edge of each step is. Use tools that help you move around (mobility aids) if they are needed. These include: Canes. Walkers. Scooters. Crutches. Turn on the lights when you go into a dark area. Replace any light bulbs as soon as they burn out. Set up your furniture so you have a clear path. Avoid moving your furniture around. If any of your floors are uneven, fix them. If there are any pets around you, be aware of where they are. Review your medicines with your doctor. Some medicines can make you feel dizzy. This can increase your chance of falling. Ask your doctor what other things that you can do to help prevent falls. This information is not intended to replace advice given to you by your health care provider. Make sure you discuss any questions you have with your health care provider. Document Released: 07/11/2009 Document Revised: 02/20/2016 Document Reviewed: 10/19/2014 Elsevier Interactive Patient Education  2017 [employer].

## 2021-09-16 NOTE — Progress Notes (Signed)
I reviewed health advisor's note, was available for consultation, and agree with documentation and plan.  

## 2021-09-23 ENCOUNTER — Other Ambulatory Visit: Payer: Self-pay | Admitting: Primary Care

## 2021-09-23 ENCOUNTER — Other Ambulatory Visit: Payer: Self-pay | Admitting: Urology

## 2021-09-23 DIAGNOSIS — E785 Hyperlipidemia, unspecified: Secondary | ICD-10-CM

## 2021-10-03 DIAGNOSIS — U071 COVID-19: Secondary | ICD-10-CM

## 2021-10-06 ENCOUNTER — Other Ambulatory Visit: Payer: Self-pay

## 2021-10-06 ENCOUNTER — Ambulatory Visit (INDEPENDENT_AMBULATORY_CARE_PROVIDER_SITE_OTHER): Payer: PPO | Admitting: Nurse Practitioner

## 2021-10-06 ENCOUNTER — Encounter: Payer: Self-pay | Admitting: Nurse Practitioner

## 2021-10-06 ENCOUNTER — Ambulatory Visit (INDEPENDENT_AMBULATORY_CARE_PROVIDER_SITE_OTHER)
Admission: RE | Admit: 2021-10-06 | Discharge: 2021-10-06 | Disposition: A | Discharge status: Home / Self Care | Payer: PPO | Source: Ambulatory Visit | Attending: Nurse Practitioner | Admitting: Nurse Practitioner

## 2021-10-06 VITALS — BP 136/88 | HR 94 | Temp 97.2°F | Resp 14 | Ht 59.0 in | Wt 232.5 lb

## 2021-10-06 DIAGNOSIS — R0989 Other specified symptoms and signs involving the circulatory and respiratory systems: Secondary | ICD-10-CM | POA: Diagnosis not present

## 2021-10-06 DIAGNOSIS — R0689 Other abnormalities of breathing: Secondary | ICD-10-CM | POA: Diagnosis not present

## 2021-10-06 DIAGNOSIS — J40 Bronchitis, not specified as acute or chronic: Secondary | ICD-10-CM | POA: Diagnosis not present

## 2021-10-06 DIAGNOSIS — R062 Wheezing: Secondary | ICD-10-CM | POA: Diagnosis not present

## 2021-10-06 DIAGNOSIS — R0609 Other forms of dyspnea: Secondary | ICD-10-CM

## 2021-10-06 MED ORDER — DOXYCYCLINE HYCLATE 100 MG PO TABS: 100.0000 mg | ORAL_TABLET | Freq: Two times a day (BID) | ORAL | 0 refills | Status: AC

## 2021-10-06 MED ORDER — PREDNISONE 20 MG PO TABS: ORAL_TABLET | ORAL | 0 refills | Status: AC

## 2021-10-06 NOTE — Assessment & Plan Note (Signed)
Patient is experiencing dyspnea on exertion.  Given history of lung scarring and prostate cancer treatment will start him on some prednisone.

## 2021-10-06 NOTE — Progress Notes (Signed)
Acute Office Visit  Subjective:    Patient ID: Preston Thomas, male    DOB: 1951-01-26, 71 y.o.   MRN: [id number]  Chief Complaint  Patient presents with   Cough    Started around 09/30/20, some chest tightness, coughing up clear phlegm, stuffy nose, post nasal drip, left ear stopped feeling, fatigue/weak, wheezing. No sore throat, fever or headache. Has taking Mucinex. Covid test negative 10/04/20.      Patient is in today for  Cough  Symptoms started on 09/30/2021. Symptoms started with cough, runny nose, cold symptoms. Has taken some cough and cold medications to help dry it up, with out much help Wed and Thursday coughing more States coughing up phlegm and his chest felt tight.  Has a history of covid with pna that required hospitalization in 2020. Has followed up with pulmonary and has some scarring to the lungs per CT chest on 02/14/2020  Past Medical History:  Diagnosis Date   COLONIC POLYPS, HX OF 08/31/2007   Qualifier: Diagnosis of  By: Silvio Pate MD, [facility]    DIVERTICULITIS, HX OF 08/31/2007   Qualifier: Diagnosis of  By: Joesph Fillers CMA (AAMA), Natasha     DIVERTICULOSIS, COLON 08/31/2007   Qualifier: Diagnosis of  By: Silvio Pate MD, [facility]    Dyspnea    Related to Covid   GERD (gastroesophageal reflux disease)    History of kidney stones    RENAL CALCULUS, HX OF 08/31/2007   Qualifier: Diagnosis of  By: Joesph Fillers CMA (AAMA), Ronny Bacon      Past Surgical History:  Procedure Laterality Date   [place]   RADIOACTIVE SEED IMPLANT N/A 06/27/2019   Procedure: RADIOACTIVE SEED IMPLANT/BRACHYTHERAPY IMPLANT;  Surgeon: Abbie Sons, MD;  Location: [facility] ORS;  Service: Urology;  Laterality: N/A;    Family History  Problem Relation Age of Onset   Early death Father    Kidney disease Father     Social History   Socioeconomic History   Marital status: Married    Spouse name: Not on file   Number of children: Not on file    Years of education: Not on file   Highest education level: Not on file  Occupational History   Not on file  Tobacco Use   Smoking status: Former    Packs/day: 1.50    Years: 10.00    Pack years: 15.00    Types: Cigarettes    Quit date: 09/28/1984    Years since quitting: 37.0   Smokeless tobacco: Never  Vaping Use   Vaping Use: Never used  Substance and Sexual Activity   Alcohol use: Not Currently   Drug use: Never   Sexual activity: Yes  Other Topics Concern   Not on file  Social History Narrative   Not on file   Social Determinants of Health   Financial Resource Strain: Low Risk    Difficulty of Paying Living Expenses: Not hard at all  Food Insecurity: No Food Insecurity   Worried About Charity fundraiser in the Last Year: Never true   Pooler in the Last Year: Never true  Transportation Needs: No Transportation Needs   Lack of Transportation (Medical): No   Lack of Transportation (Non-Medical): No  Physical Activity: Sufficiently Active   Days of Exercise per Week: 4 days   Minutes of Exercise per Session: 50 min  Stress: No Stress Concern Present   Feeling of Stress : Not  at all  Social Connections: Socially Integrated   Frequency of Communication with Friends and Family: More than three times a week   Frequency of Social Gatherings with Friends and Family: More than three times a week   Attends Religious Services: More than 4 times per year   Active Member of [employer] or Organizations: Yes   Attends Music therapist: More than 4 times per year   Marital Status: Married  Human resources officer Violence: Not At Risk   Fear of Current or Ex-Partner: No   Emotionally Abused: No   Physically Abused: No   Sexually Abused: No    Outpatient Medications Prior to Visit  Medication Sig Dispense Refill   atorvastatin (LIPITOR) 20 MG tablet Take 1 tablet (20 mg total) by mouth daily. for cholesterol. Office visit required for further refills. 30 tablet 0    clobetasol ointment (TEMOVATE) 2.35 % Apply 1 application topically 2 (two) times daily. 30 g 0   cyclobenzaprine (FLEXERIL) 10 MG tablet Take 0.5-1 tablets (5-10 mg total) by mouth at bedtime as needed for muscle spasms. 30 tablet 2   diclofenac (VOLTAREN) 75 MG EC tablet Take 1 tablet (75 mg total) by mouth 2 (two) times daily. 60 tablet 2   fluocinonide (LIDEX) 0.05 % external solution Apply 1 application topically 2 (two) times daily. 60 mL 0   fluticasone (CUTIVATE) 0.05 % cream Apply topically 2 (two) times daily. 30 g 0   omeprazole (PRILOSEC) 20 MG capsule Take 20 mg by mouth daily.     SUMAtriptan (IMITREX) 100 MG tablet TAKE 1 TABLET BY MOUTH AT MIGRAINE ONSET,MAY REPEAT IN 2 HOURS IF HEADACHE PERSISTS/RECURS *DO NOT EXCEED 100MG  IN 24 HOURS** 10 tablet 0   tamsulosin (FLOMAX) 0.4 MG CAPS capsule TAKE 1 CAPSULE BY MOUTH ONCE DAILY 90 capsule 2   triamcinolone (KENALOG) 0.025 % cream Apply 1 application topically 2 (two) times daily.     vitamin B-12 (CYANOCOBALAMIN) 500 MCG tablet Take 500 mcg by mouth daily.     vitamin C (ASCORBIC ACID) 250 MG tablet Take 250 mg by mouth daily.     Zinc 100 MG TABS Take by mouth.     Cholecalciferol (DIALYVITE VITAMIN D 5000) 125 MCG (5000 UT) capsule Take 5,000 Units by mouth daily.     leuprolide, 6 Month, (ELIGARD) 45 MG injection Inject 45 mg into the skin every 6 (six) months.     predniSONE (DELTASONE) 20 MG tablet Take 2 tablets by mouth once daily for five days, then 1 tablet once daily for five days. 15 tablet 0   No facility-administered medications prior to visit.    Allergies  Allergen Reactions   Penicillins     Passed out Did it involve swelling of the face/tongue/throat, SOB, or low BP? Yes Did it involve sudden or severe rash/hives, skin peeling, or any reaction on the inside of your mouth or nose? No Did you need to seek medical attention at a hospital or doctor's office? No When did it last happen?      30 + years If all above  answers are "NO", may proceed with cephalosporin use.     Review of Systems  Constitutional:  Positive for fatigue. Negative for chills and fever.  HENT:  Positive for congestion, ear pain (left feels full) and rhinorrhea. Negative for ear discharge, sinus pressure, sinus pain and sore throat.   Respiratory:  Positive for cough (clear phelgm) and shortness of breath (doe).   Cardiovascular:  Negative  for chest pain.  Gastrointestinal:  Positive for diarrhea. Negative for abdominal pain, nausea and vomiting.  Musculoskeletal:  Negative for arthralgias and myalgias.  Neurological:  Negative for dizziness, light-headedness and headaches.      Objective:    Physical Exam Constitutional:      Appearance: He is obese.  HENT:     Right Ear: Tympanic membrane, ear canal and external ear normal.     Left Ear: Tympanic membrane, ear canal and external ear normal.     Nose:     Right Sinus: No maxillary sinus tenderness or frontal sinus tenderness.     Left Sinus: No maxillary sinus tenderness or frontal sinus tenderness.     Mouth/Throat:     Mouth: Mucous membranes are moist.     Pharynx: Oropharynx is clear.  Cardiovascular:     Rate and Rhythm: Normal rate and regular rhythm.  Pulmonary:     Effort: Pulmonary effort is normal.     Breath sounds: Examination of the right-upper field reveals wheezing and rhonchi. Examination of the left-upper field reveals wheezing and rhonchi. Examination of the right-lower field reveals wheezing, rhonchi and rales. Examination of the left-lower field reveals wheezing and rhonchi. Wheezing, rhonchi and rales present.  Abdominal:     General: Bowel sounds are normal.  Lymphadenopathy:     Cervical: No cervical adenopathy.  Neurological:     Mental Status: He is alert.    BP 136/88    Pulse 94    Temp (!) 97.2 F (36.2 C)    Resp 14    Ht 4\' 11"  (1.499 m)    Wt 232 lb 8 oz (105.5 kg)    SpO2 94%    BMI 46.96 kg/m  Wt Readings from Last 3 Encounters:   10/06/21 232 lb 8 oz (105.5 kg)  09/12/21 226 lb (102.5 kg)  06/26/21 226 lb (102.5 kg)    Health Maintenance Due  Topic Date Due   Zoster Vaccines- Shingrix (1 of 2) Never done   Pneumonia Vaccine 60+ Years old (2 - PCV) 08/18/2019   COVID-19 Vaccine (3 - Pfizer risk series) 01/02/2020    There are no preventive care reminders to display for this patient.   Lab Results  Component Value Date   TSH 2.17 08/17/2018   Lab Results  Component Value Date   WBC 8.1 09/02/2020   HGB 14.0 09/02/2020   HCT 41.8 09/02/2020   MCV 88.9 09/02/2020   PLT 299.0 09/02/2020   Lab Results  Component Value Date   NA 144 09/02/2020   K 4.8 09/02/2020   CO2 28 09/02/2020   GLUCOSE 98 09/02/2020   BUN 15 09/02/2020   CREATININE 0.99 09/02/2020   BILITOT 0.3 09/02/2020   ALKPHOS 57 09/02/2020   AST 14 09/02/2020   ALT 31 09/02/2020   PROT 6.6 09/02/2020   ALBUMIN 4.2 09/02/2020   CALCIUM 9.4 09/02/2020   ANIONGAP 10 05/17/2019   GFR 79.08 09/02/2020   Lab Results  Component Value Date   CHOL 157 09/02/2020   Lab Results  Component Value Date   HDL 39.80 09/02/2020   Lab Results  Component Value Date   LDLCALC 86 09/02/2020   Lab Results  Component Value Date   TRIG 159.0 (H) 09/02/2020   Lab Results  Component Value Date   CHOLHDL 4 09/02/2020   Lab Results  Component Value Date   HGBA1C 6.1 (A) 03/17/2021       Assessment & Plan:  Problem List Items Addressed This Visit       Respiratory   Bronchitis - Primary    Given patient's course of illness physical exam and history will elect to treat patient with antibiotics.  We will choose doxycycline 100 mg twice daily for 7 days.  Was having Lupron injections for prostate cancer states he did not take the one in November.      Relevant Medications   doxycycline (VIBRA-TABS) 100 MG tablet     Other   DOE (dyspnea on exertion)    Patient is experiencing dyspnea on exertion.  Given history of lung scarring  and prostate cancer treatment will start him on some prednisone.      Relevant Medications   predniSONE (DELTASONE) 20 MG tablet   Other Relevant Orders   DG Chest 2 View (Completed)   [facility] breath sounds    Obtain chest x-ray.  Pending result      Relevant Orders   DG Chest 2 View (Completed)     Meds ordered this encounter  Medications   predniSONE (DELTASONE) 20 MG tablet    Sig: Take 1 tablet (20 mg total) by mouth 2 (two) times daily with a meal for 4 days, THEN 1 tablet (20 mg total) daily with breakfast for 4 days.    Dispense:  12 tablet    Refill:  0    Order Specific Question:   Supervising Provider    Answer:   Glori Bickers, MARNE A [[id number]]   doxycycline (VIBRA-TABS) 100 MG tablet    Sig: Take 1 tablet (100 mg total) by mouth 2 (two) times daily for 7 days.    Dispense:  14 tablet    Refill:  0    Order Specific Question:   Supervising Provider    Answer:   Loura Pardon A [[id number]]   This visit occurred during the SARS-CoV-2 public health emergency.  Safety protocols were in place, including screening questions prior to the visit, additional usage of staff PPE, and extensive cleaning of exam room while observing appropriate contact time as indicated for disinfecting solutions.    Romilda Garret, NP

## 2021-10-06 NOTE — Patient Instructions (Signed)
Nice to see you today I called in steroids and antibiotic to your local pharmacy I will be in touch with the chest xray results Follow up if symptoms fail to improve or worsen

## 2021-10-06 NOTE — Assessment & Plan Note (Signed)
Given patient's course of illness physical exam and history will elect to treat patient with antibiotics.  We will choose doxycycline 100 mg twice daily for 7 days.  Was having Lupron injections for prostate cancer states he did not take the one in November.

## 2021-10-06 NOTE — Assessment & Plan Note (Signed)
Obtain chest x-ray.  Pending result

## 2021-10-23 ENCOUNTER — Telehealth: Payer: Self-pay | Admitting: Primary Care

## 2021-10-23 NOTE — Telephone Encounter (Signed)
Pt says he is having a coughing relapse, wonders if he can get more of the medication that [employer] prescribed last time, he said it worked really well.

## 2021-10-23 NOTE — Telephone Encounter (Signed)
Left detailed message on the voicemail-ok per DPR On file. Will follow up with patient tomorrow if I do not hear back from him

## 2021-10-23 NOTE — Telephone Encounter (Signed)
Spoke with patient. Patient had VV visit with [facility] on 10/06/21 and was given Prednisone and Doxycycline. Patient states symptoms and cough all resolved until yesterday started to cough again, feels the same as it did last time when he had symptoms. Patient states the congestion/drainage is the upper part of the chest/throat- clear phlegm- has post nasal drainage. Nothing else at this time like fever or sore throat. Advised patient I would check with Matt on recommendations at this time and call him back.

## 2021-10-23 NOTE — Telephone Encounter (Signed)
Sounds like patient got better and is having new symptoms that started yesterday. This could be an entirely new illness. The first thing to worry about would be covid so I would recommend testing for that by tomorrow. If he tests today it could be soon for the rapid test to come back positive. He can use some flonase in the meantime to help with the post nasal drip along with rest and drinking plenty of fluids

## 2021-10-23 NOTE — Telephone Encounter (Signed)
Pt returning your call

## 2021-10-23 NOTE — Telephone Encounter (Signed)
Patient advised and will update [place] tomorrow with the test results.

## 2021-10-24 MED ORDER — NIRMATRELVIR/RITONAVIR (PAXLOVID)TABLET: 3.0000 | ORAL_TABLET | Freq: Two times a day (BID) | ORAL | 0 refills | Status: AC

## 2021-10-24 NOTE — Telephone Encounter (Signed)
Spoke with patient via phone, doing about the same, no worse. He is at high risk for hospitalization secondary to COVID-19 infection, so we will proceed with antiviral treatment as he is within the window treatment.  Today is day 2 of symptom onset.  Prescription for Paxlovid sent to pharmacy

## 2021-10-25 ENCOUNTER — Other Ambulatory Visit: Payer: Self-pay | Admitting: Primary Care

## 2021-10-25 DIAGNOSIS — E785 Hyperlipidemia, unspecified: Secondary | ICD-10-CM

## 2021-11-21 ENCOUNTER — Other Ambulatory Visit: Payer: Self-pay | Admitting: Primary Care

## 2021-11-21 DIAGNOSIS — E785 Hyperlipidemia, unspecified: Secondary | ICD-10-CM

## 2021-11-23 NOTE — Telephone Encounter (Signed)
Patient overdue for CPE with me. Needs to be scheduled ASAP. Any day, anytime, any slot.

## 2021-11-25 NOTE — Telephone Encounter (Signed)
LVM with pt to call back to schedule appt for physical.

## 2021-11-26 ENCOUNTER — Other Ambulatory Visit: Payer: Self-pay | Admitting: Primary Care

## 2021-11-26 DIAGNOSIS — E785 Hyperlipidemia, unspecified: Secondary | ICD-10-CM

## 2021-11-28 ENCOUNTER — Ambulatory Visit (INDEPENDENT_AMBULATORY_CARE_PROVIDER_SITE_OTHER): Payer: PPO | Admitting: Urology

## 2021-11-28 ENCOUNTER — Encounter: Payer: Self-pay | Admitting: Urology

## 2021-11-28 ENCOUNTER — Other Ambulatory Visit: Payer: Self-pay

## 2021-11-28 VITALS — BP 139/81 | HR 86 | Ht 59.0 in | Wt 229.0 lb

## 2021-11-28 DIAGNOSIS — C61 Malignant neoplasm of prostate: Secondary | ICD-10-CM

## 2021-11-28 NOTE — Progress Notes (Signed)
? ?11/28/2021 ?9:17 AM  ? ?Preston Thomas ?[birth year] ?[id number] ? ?Referring provider: Pleas [name], NP ?[address] ?[place],  [place] [address] ? ?Chief Complaint  ?Patient presents with  ? Prostate Cancer  ? ? ? ?Urologic history: ?1. cT1c prostate cancer-high risk ?-Biopsy 10/2018, PSA 12.7, 28 g gland ?-10/12 cores positive Gleason 3+3 to 4+5 ?-CT/bone scan negative for metastatic disease ?-tx IMRT + brachytherapy + ADT; brachytherapy 05/2019 ? ? ?HPI: ?71 y.o. male presents for prostate cancer follow-up. ? ?Doing well since last visit ?PSA 04/23/2021 0.04 (nadir) ?Received final leuprolide injection 02/10/2021 ?On tamsulosin and voiding with good stream; nocturia x1 ?Denies dysuria, gross hematuria ?No flank, abdominal, pelvic pain ?Has had mild right breast tenderness ? ? ?PMH: ?Past Medical History:  ?Diagnosis Date  ? COLONIC POLYPS, HX OF 08/31/2007  ? Qualifier: Diagnosis of  By: Silvio Pate MD, [facility]   ? DIVERTICULITIS, HX OF 08/31/2007  ? Qualifier: Diagnosis of  By: Joesph Fillers CMA ([place]), Natasha    ? DIVERTICULOSIS, COLON 08/31/2007  ? Qualifier: Diagnosis of  By: Silvio Pate MD, [facility]   ? Dyspnea   ? Related to Covid  ? GERD (gastroesophageal reflux disease)   ? History of kidney stones   ? RENAL CALCULUS, HX OF 08/31/2007  ? Qualifier: Diagnosis of  By: Joesph Fillers CMA (AAMA), Natasha    ? ? ?Surgical History: ?Past Surgical History:  ?Procedure Laterality Date  ? APPENDECTOMY  1987  ? CHOLECYSTECTOMY  1987  ? RADIOACTIVE SEED IMPLANT N/A 06/27/2019  ? Procedure: RADIOACTIVE SEED IMPLANT/BRACHYTHERAPY IMPLANT;  Surgeon: Abbie Sons, MD;  Location: [facility] ORS;  Service: Urology;  Laterality: N/A;  ? ? ?Home Medications:  ?Allergies as of 11/28/2021   ? ?   Reactions  ? Penicillins   ? Passed out ?Did it involve swelling of the face/tongue/throat, SOB, or low BP? Yes ?Did it involve sudden or severe rash/hives, skin peeling, or any reaction on the inside of your mouth or nose? No ?Did you need to  seek medical attention at a hospital or doctor's office? No ?When did it last happen?      30 + years ?If all above answers are "NO", may proceed with cephalosporin use.  ? ?  ? ?  ?Medication List  ?  ? ?  ? Accurate as of November 28, 2021  9:17 AM. If you have any questions, ask your nurse or doctor.  ?  ?  ? ?  ? ?atorvastatin 20 MG tablet ?Commonly known as: LIPITOR ?TAKE 1 TABLET BY MOUTH ONCE A DAY FOR CHOLESTEROL. NEED OFFICE VISIT FOR REFILLS. ?  ?clobetasol ointment 0.05 % ?Commonly known as: TEMOVATE ?Apply 1 application topically 2 (two) times daily. ?  ?cyclobenzaprine 10 MG tablet ?Commonly known as: FLEXERIL ?Take 0.5-1 tablets (5-10 mg total) by mouth at bedtime as needed for muscle spasms. ?  ?diclofenac 75 MG EC tablet ?Commonly known as: VOLTAREN ?Take 1 tablet (75 mg total) by mouth 2 (two) times daily. ?  ?fluocinonide 0.05 % external solution ?Commonly known as: LIDEX ?Apply 1 application topically 2 (two) times daily. ?  ?fluticasone 0.05 % cream ?Commonly known as: CUTIVATE ?Apply topically 2 (two) times daily. ?  ?omeprazole 20 MG capsule ?Commonly known as: PRILOSEC ?Take 20 mg by mouth daily. ?  ?SUMAtriptan 100 MG tablet ?Commonly known as: IMITREX ?TAKE 1 TABLET BY MOUTH AT MIGRAINE ONSET,MAY REPEAT IN 2 HOURS IF HEADACHE PERSISTS/RECURS *DO NOT EXCEED 100MG  IN 24 HOURS** ?  ?  tamsulosin 0.4 MG Caps capsule ?Commonly known as: FLOMAX ?TAKE 1 CAPSULE BY MOUTH ONCE DAILY ?  ?triamcinolone 0.025 % cream ?Commonly known as: KENALOG ?Apply 1 application topically 2 (two) times daily. ?  ?vitamin B-12 500 MCG tablet ?Commonly known as: CYANOCOBALAMIN ?Take 500 mcg by mouth daily. ?  ?vitamin C 250 MG tablet ?Commonly known as: ASCORBIC ACID ?Take 250 mg by mouth daily. ?  ?Zinc 100 MG Tabs ?Take by mouth. ?  ? ?  ? ? ?Allergies:  ?Allergies  ?Allergen Reactions  ? Penicillins   ?  Passed out ?Did it involve swelling of the face/tongue/throat, SOB, or low BP? Yes ?Did it involve sudden or severe  rash/hives, skin peeling, or any reaction on the inside of your mouth or nose? No ?Did you need to seek medical attention at a hospital or doctor's office? No ?When did it last happen?      30 + years ?If all above answers are "NO", may proceed with cephalosporin use. ?  ? ? ?Family History: ?Family History  ?Problem Relation Age of Onset  ? Early death Father   ? Kidney disease Father   ? ? ?Social History:  reports that he quit smoking about 37 years ago. His smoking use included cigarettes. He has a 15.00 pack-year smoking history. He has never used smokeless tobacco. He reports that he does not currently use alcohol. He reports that he does not use drugs. ? ? ?Physical Exam: ?BP 139/81   Pulse 86   Ht 4\' 11"  (1.499 m)   Wt 229 lb (103.9 kg)   BMI 46.25 kg/m?   ?Constitutional:  Alert and oriented, No acute distress. ?HEENT: [place] AT, moist mucus membranes.  Trachea midline, no masses. ?Cardiovascular: No clubbing, cyanosis, or edema. ?Respiratory: Normal respiratory effort, no increased work of breathing.  No breast mass ? ?Assessment & Plan:   ? ?1.  T1c high risk prostate cancer ?Stable ?PSA drawn today.  Follow-up 1 year with PSA ?Continue radiation oncology follow-up ?Mild breast tenderness most likely secondary to leuprolide ? ? ?Abbie Sons, MD ? ?[facility] ?[address], [address] ?[place], [place] [address] ?[phone] ?

## 2021-11-29 LAB — PSA: Prostate Specific Ag, Serum: <0.1 ng/mL (ref 0.0–4.0)

## 2021-11-30 ENCOUNTER — Encounter: Payer: Self-pay | Admitting: Urology

## 2021-12-01 DIAGNOSIS — G4733 Obstructive sleep apnea (adult) (pediatric): Secondary | ICD-10-CM | POA: Diagnosis not present

## 2021-12-19 ENCOUNTER — Encounter: Payer: Self-pay | Admitting: Primary Care

## 2021-12-19 ENCOUNTER — Other Ambulatory Visit: Payer: Self-pay

## 2021-12-19 ENCOUNTER — Ambulatory Visit (INDEPENDENT_AMBULATORY_CARE_PROVIDER_SITE_OTHER): Payer: PPO | Admitting: Primary Care

## 2021-12-19 ENCOUNTER — Other Ambulatory Visit: Payer: Self-pay | Admitting: Primary Care

## 2021-12-19 VITALS — BP 128/80 | HR 94 | Ht 59.0 in | Wt 231.4 lb

## 2021-12-19 DIAGNOSIS — G43909 Migraine, unspecified, not intractable, without status migrainosus: Secondary | ICD-10-CM

## 2021-12-19 DIAGNOSIS — Z0000 Encounter for general adult medical examination without abnormal findings: Secondary | ICD-10-CM | POA: Diagnosis not present

## 2021-12-19 DIAGNOSIS — K219 Gastro-esophageal reflux disease without esophagitis: Secondary | ICD-10-CM | POA: Diagnosis not present

## 2021-12-19 DIAGNOSIS — Z23 Encounter for immunization: Secondary | ICD-10-CM

## 2021-12-19 DIAGNOSIS — R7303 Prediabetes: Secondary | ICD-10-CM

## 2021-12-19 DIAGNOSIS — E785 Hyperlipidemia, unspecified: Secondary | ICD-10-CM

## 2021-12-19 DIAGNOSIS — R609 Edema, unspecified: Secondary | ICD-10-CM

## 2021-12-19 DIAGNOSIS — G4733 Obstructive sleep apnea (adult) (pediatric): Secondary | ICD-10-CM | POA: Diagnosis not present

## 2021-12-19 DIAGNOSIS — Z8546 Personal history of malignant neoplasm of prostate: Secondary | ICD-10-CM

## 2021-12-19 DIAGNOSIS — L409 Psoriasis, unspecified: Secondary | ICD-10-CM

## 2021-12-19 LAB — LIPID PANEL
Cholesterol: 139 mg/dL (ref 0–200)
HDL: 32.2 mg/dL — ABNORMAL LOW (ref >39.00)
LDL Cholesterol: 75 mg/dL (ref 0–99)
NonHDL: 106.54
Total CHOL/HDL Ratio: 4
Triglycerides: 159 mg/dL — ABNORMAL HIGH (ref 0.0–149.0)
VLDL: 31.8 mg/dL (ref 0.0–40.0)

## 2021-12-19 LAB — COMPREHENSIVE METABOLIC PANEL
ALT: 26 U/L (ref 0–53)
AST: 17 U/L (ref 0–37)
Albumin: 4.3 g/dL (ref 3.5–5.2)
Alkaline Phosphatase: 60 U/L (ref 39–117)
BUN: 15 mg/dL (ref 6–23)
CO2: 28 mEq/L (ref 19–32)
Calcium: 9.4 mg/dL (ref 8.4–10.5)
Chloride: 107 mEq/L (ref 96–112)
Creatinine, Ser: 1.01 mg/dL (ref 0.40–1.50)
GFR: 76.51 mL/min (ref >60.00)
Glucose, Bld: 104 mg/dL — ABNORMAL HIGH (ref 70–99)
Potassium: 4 mEq/L (ref 3.5–5.1)
Sodium: 141 mEq/L (ref 135–145)
Total Bilirubin: 0.4 mg/dL (ref 0.2–1.2)
Total Protein: 7 g/dL (ref 6.0–8.3)

## 2021-12-19 LAB — CBC
HCT: 43.6 % (ref 39.0–52.0)
Hemoglobin: 14.4 g/dL (ref 13.0–17.0)
MCHC: 33 g/dL (ref 30.0–36.0)
MCV: 90.2 fl (ref 78.0–100.0)
Platelets: 296 10*3/uL (ref 150.0–400.0)
RBC: 4.83 Mil/uL (ref 4.22–5.81)
RDW: 14.4 % (ref 11.5–15.5)
WBC: 10.8 10*3/uL — ABNORMAL HIGH (ref 4.0–10.5)

## 2021-12-19 LAB — POCT GLYCOSYLATED HEMOGLOBIN (HGB A1C): Hemoglobin A1C: 5.8 % — AB (ref 4.0–5.6)

## 2021-12-19 NOTE — Addendum Note (Signed)
Addended by: Tyrone Apple on: 12/19/2021 12:46 PM ? ? Modules accepted: Orders ? ?

## 2021-12-19 NOTE — Assessment & Plan Note (Addendum)
Following with urology. ?PSA undetectable per recent labs. ? ?Office notes reviewed from Urology in March 2023. ?

## 2021-12-19 NOTE — Assessment & Plan Note (Signed)
Determined to be secondary to leuprolide per Urology. ?

## 2021-12-19 NOTE — Assessment & Plan Note (Signed)
Controlled.  Infrequent use of sumatriptan.  Continue sumatriptan 100 mg PRN. 

## 2021-12-19 NOTE — Assessment & Plan Note (Signed)
Compliant to CPAP machine nightly. ?Tolerates well. ?

## 2021-12-19 NOTE — Patient Instructions (Signed)
Stop by the lab prior to leaving today. I will notify you of your results once received.  ? ?Continue to work on [employer], great job! ? ?Complete the shingles vaccines at the pharmacy. ? ?It was a pleasure to see you today! ? ?Preventive Care 15 Years and Older, Male ?Preventive care refers to lifestyle choices and visits with your health care provider that can promote health and wellness. Preventive care visits are also called wellness exams. ?What can I expect for my preventive care visit? ?Counseling ?During your preventive care visit, your health care provider may ask about your: ?Medical history, including: ?Past medical problems. ?Family medical history. ?History of falls. ?Current health, including: ?Emotional well-being. ?Home life and relationship well-being. ?Sexual activity. ?Memory and ability to understand (cognition). ?Lifestyle, including: ?Alcohol, nicotine or tobacco, and drug use. ?Access to firearms. ?Diet, exercise, and sleep habits. ?Work and work Statistician. ?Sunscreen use. ?Safety issues such as seatbelt and bike helmet use. ?Physical exam ?Your health care provider will check your: ?Height and weight. These may be used to calculate your BMI (body mass index). BMI is a measurement that tells if you are at a healthy weight. ?Waist circumference. This measures the distance around your waistline. This measurement also tells if you are at a healthy weight and may help predict your risk of certain diseases, such as type 2 diabetes and high blood pressure. ?Heart rate and blood pressure. ?Body temperature. ?Skin for abnormal spots. ?What immunizations do I need? ?Vaccines are usually given at various ages, according to a schedule. Your health care provider will recommend vaccines for you based on your age, medical history, and lifestyle or other factors, such as travel or where you work. ?What tests do I need? ?Screening ?Your health care provider may recommend screening tests for certain  conditions. This may include: ?Lipid and cholesterol levels. ?Diabetes screening. This is done by checking your blood sugar (glucose) after you have not eaten for a while (fasting). ?Hepatitis C test. ?Hepatitis B test. ?HIV (human immunodeficiency virus) test. ?STI (sexually transmitted infection) testing, if you are at risk. ?Lung cancer screening. ?Colorectal cancer screening. ?Prostate cancer screening. ?Abdominal aortic aneurysm (AAA) screening. You may need this if you are a current or former smoker. ?Talk with your health care provider about your test results, treatment options, and if necessary, the need for more tests. ?Follow these instructions at home: ?Eating and drinking ? ?Eat a diet that includes fresh fruits and vegetables, whole grains, lean protein, and low-fat dairy products. Limit your intake of foods with high amounts of sugar, saturated fats, and salt. ?Take vitamin and mineral supplements as recommended by your health care provider. ?Do not drink alcohol if your health care provider tells you not to drink. ?If you drink alcohol: ?Limit how much you have to 0-2 drinks a day. ?Know how much alcohol is in your drink. In the [place] one drink equals one 12 oz bottle of beer (355 mL), one 5 oz glass of wine (148 mL), or one 1? oz glass of hard liquor (44 mL). ?Lifestyle ?Brush your teeth every morning and night with fluoride toothpaste. Floss one time each day. ?Exercise for at least 30 minutes 5 or more days each week. ?Do not use any products that contain nicotine or tobacco. These products include cigarettes, chewing tobacco, and vaping devices, such as e-cigarettes. If you need help quitting, ask your health care provider. ?Do not use drugs. ?If you are sexually active, practice safe sex. Use a condom or  other form of protection to prevent STIs. ?Take aspirin only as told by your health care provider. Make sure that you understand how much to take and what form to take. Work with your health care  provider to find out whether it is safe and beneficial for you to take aspirin daily. ?Ask your health care provider if you need to take a cholesterol-lowering medicine (statin). ?Find healthy ways to manage stress, such as: ?Meditation, yoga, or listening to music. ?Journaling. ?Talking to a trusted person. ?Spending time with friends and family. ?Safety ?Always wear your seat belt while driving or riding in a vehicle. ?Do not drive: ?If you have been drinking alcohol. Do not ride with someone who has been drinking. ?When you are tired or distracted. ?While texting. ?If you have been using any mind-altering substances or drugs. ?Wear a helmet and other protective equipment during sports activities. ?If you have firearms in your house, make sure you follow all gun safety procedures. ?Minimize exposure to UV radiation to reduce your risk of skin cancer. ?What's next? ?Visit your health care provider once a year for an annual wellness visit. ?Ask your health care provider how often you should have your eyes and teeth checked. ?Stay up to date on all vaccines. ?This information is not intended to replace advice given to you by your health care provider. Make sure you discuss any questions you have with your health care provider. ?Document Revised: 03/12/2021 Document Reviewed: 03/12/2021 ?Elsevier Patient Education ? 2022 [place]. ? ? ?

## 2021-12-19 NOTE — Assessment & Plan Note (Signed)
Controlled.   Continue omeprazole 20 mg daily. 

## 2021-12-19 NOTE — Assessment & Plan Note (Signed)
Improved. ?Following with dermatology. ? ?Continue clobetasol 0.05% PRN, Lidex 0.05% solution, triamcinolone 0.25%, Cutivate 0.05% cream. ?

## 2021-12-19 NOTE — Assessment & Plan Note (Signed)
Prevnar 20 due and provided today. He will complete Shingrix from the pharmacy.  ? ?PSA UTD. ?Colonoscopy UTD, due 2028. ? ?Discussed the importance of a healthy diet and regular exercise in order for weight loss, and to reduce the risk of further co-morbidity. ? ?Exam today stable. ?Labs reviewed and pending. ?

## 2021-12-19 NOTE — Assessment & Plan Note (Addendum)
A1C today of 5.8 which is improved! ? ?Commended him on lifestyle changes! ?

## 2021-12-19 NOTE — Assessment & Plan Note (Signed)
Continue atorvastatin 20 mg daily, repeat lipid panel pending. 

## 2021-12-19 NOTE — Progress Notes (Signed)
? ?Subjective:  ? ? Patient ID: Preston Thomas, male    DOB: 07-06-1951, 71 y.o.   MRN: [id number] ? ?HPI ? ?Preston Thomas is a very pleasant 70 y.o. male who presents today for complete physical and follow up of chronic conditions. ? ?Immunizations: ?-Tetanus: 2004 ?-Influenza: Did not complete this season  ?-Covid-19: 2 vaccines  ?-Shingles: Never completed  ?-Pneumonia: Pneumovax in 2019 ? ?Diet: Inproved diet! Has been cutting out fried and fatty foods, increased intake of veggies ?Exercise: No regular exercise. ? ?Eye exam: Completes annually  ?Dental exam: Completed years ago.  ? ?Colonoscopy: Completed in 2018, due 2028 ?PSA: Due ? ?BP Readings from Last 3 Encounters:  ?12/19/21 128/80  ?11/28/21 139/81  ?10/06/21 136/88  ? ? ? ? ? ?Review of Systems  ?Constitutional:  Negative for unexpected weight change.  ?HENT:  Negative for rhinorrhea.   ?Respiratory:  Negative for shortness of breath.   ?Cardiovascular:  Negative for chest pain.  ?Gastrointestinal:  Negative for constipation and diarrhea.  ?Genitourinary:  Negative for difficulty urinating.  ?Musculoskeletal:  Positive for arthralgias.  ?Skin:  Negative for rash.  ?Allergic/Immunologic: Negative for environmental allergies.  ?Neurological:  Negative for dizziness and headaches.  ?Psychiatric/Behavioral:  The patient is not nervous/anxious.   ? ?   ? ? ?Past Medical History:  ?Diagnosis Date  ? COLONIC POLYPS, HX OF 08/31/2007  ? Qualifier: Diagnosis of  By: Silvio Pate MD, [facility]   ? DIVERTICULITIS, HX OF 08/31/2007  ? Qualifier: Diagnosis of  By: Joesph Fillers CMA (Lasana), Natasha    ? DIVERTICULOSIS, COLON 08/31/2007  ? Qualifier: Diagnosis of  By: Silvio Pate MD, [facility]   ? Dyspnea   ? Related to Covid  ? GERD (gastroesophageal reflux disease)   ? History of kidney stones   ? RENAL CALCULUS, HX OF 08/31/2007  ? Qualifier: Diagnosis of  By: Joesph Fillers CMA (AAMA), Natasha    ? ? ?Social History  ? ?Socioeconomic History  ? Marital status:  Married  ?  Spouse name: Not on file  ? Number of children: Not on file  ? Years of education: Not on file  ? Highest education level: Not on file  ?Occupational History  ? Not on file  ?Tobacco Use  ? Smoking status: Former  ?  Packs/day: 1.50  ?  Years: 10.00  ?  Pack years: 15.00  ?  Types: Cigarettes  ?  Quit date: 09/28/1984  ?  Years since quitting: 37.2  ? Smokeless tobacco: Never  ?Vaping Use  ? Vaping Use: Never used  ?Substance and Sexual Activity  ? Alcohol use: Not Currently  ? Drug use: Never  ? Sexual activity: Yes  ?Other Topics Concern  ? Not on file  ?Social History Narrative  ? Not on file  ? ?Social Determinants of Health  ? ?Financial Resource Strain: Low Risk   ? Difficulty of Paying Living Expenses: Not hard at all  ?Food Insecurity: No Food Insecurity  ? Worried About Charity fundraiser in the Last Year: Never true  ? Ran Out of Food in the Last Year: Never true  ?Transportation Needs: No Transportation Needs  ? Lack of Transportation (Medical): No  ? Lack of Transportation (Non-Medical): No  ?Physical Activity: Sufficiently Active  ? Days of Exercise per Week: 4 days  ? Minutes of Exercise per Session: 50 min  ?Stress: No Stress Concern Present  ? Feeling of Stress : Not at all  ?Social Connections: Socially Integrated  ?  Frequency of Communication with Friends and Family: More than three times a week  ? Frequency of Social Gatherings with Friends and Family: More than three times a week  ? Attends Religious Services: More than 4 times per year  ? Active Member of Clubs or Organizations: Yes  ? Attends Archivist Meetings: More than 4 times per year  ? Marital Status: Married  ?Intimate Partner Violence: Not At Risk  ? Fear of Current or Ex-Partner: No  ? Emotionally Abused: No  ? Physically Abused: No  ? Sexually Abused: No  ? ? ?Past Surgical History:  ?Procedure Laterality Date  ? APPENDECTOMY  1987  ? CHOLECYSTECTOMY  1987  ? RADIOACTIVE SEED IMPLANT N/A 06/27/2019  ? Procedure:  RADIOACTIVE SEED IMPLANT/BRACHYTHERAPY IMPLANT;  Surgeon: Abbie Sons, MD;  Location: [facility] ORS;  Service: Urology;  Laterality: N/A;  ? ? ?Family History  ?Problem Relation Age of Onset  ? Early death Father   ? Kidney disease Father   ? ? ?Allergies  ?Allergen Reactions  ? Penicillins   ?  Passed out ?Did it involve swelling of the face/tongue/throat, SOB, or low BP? Yes ?Did it involve sudden or severe rash/hives, skin peeling, or any reaction on the inside of your mouth or nose? No ?Did you need to seek medical attention at a hospital or doctor's office? No ?When did it last happen?      30 + years ?If all above answers are "NO", may proceed with cephalosporin use. ?  ? ? ?Current Outpatient Medications on File Prior to Visit  ?Medication Sig Dispense Refill  ? atorvastatin (LIPITOR) 20 MG tablet TAKE 1 TABLET BY MOUTH ONCE A DAY FOR CHOLESTEROL. NEED OFFICE VISIT FOR REFILLS. 30 tablet 0  ? clobetasol ointment (TEMOVATE) 1.59 % Apply 1 application topically 2 (two) times daily. 30 g 0  ? cyclobenzaprine (FLEXERIL) 10 MG tablet Take 0.5-1 tablets (5-10 mg total) by mouth at bedtime as needed for muscle spasms. 30 tablet 2  ? diclofenac (VOLTAREN) 75 MG EC tablet Take 1 tablet (75 mg total) by mouth 2 (two) times daily. 60 tablet 2  ? fluocinonide (LIDEX) 0.05 % external solution Apply 1 application topically 2 (two) times daily. 60 mL 0  ? fluticasone (CUTIVATE) 0.05 % cream Apply topically 2 (two) times daily. 30 g 0  ? omeprazole (PRILOSEC) 20 MG capsule Take 20 mg by mouth daily.    ? SUMAtriptan (IMITREX) 100 MG tablet TAKE 1 TABLET BY MOUTH AT MIGRAINE ONSET,MAY REPEAT IN 2 HOURS IF HEADACHE PERSISTS/RECURS *DO NOT EXCEED '100MG'$  IN 24 HOURS** 10 tablet 0  ? tamsulosin (FLOMAX) 0.4 MG CAPS capsule TAKE 1 CAPSULE BY MOUTH ONCE DAILY 90 capsule 2  ? triamcinolone (KENALOG) 0.025 % cream Apply 1 application topically 2 (two) times daily.    ? vitamin B-12 (CYANOCOBALAMIN) 500 MCG tablet Take 500 mcg by mouth  daily.    ? vitamin C (ASCORBIC ACID) 250 MG tablet Take 250 mg by mouth daily.    ? Zinc 100 MG TABS Take by mouth.    ? ?No current facility-administered medications on file prior to visit.  ? ? ?BP 128/80   Pulse 94   Ht '4\' 11"'$  (1.499 m)   Wt 231 lb 6.4 oz (105 kg)   SpO2 94%   BMI 46.74 kg/m?  ?Objective:  ? Physical Exam ?HENT:  ?   Right Ear: Tympanic membrane and ear canal normal.  ?   Left Ear: Tympanic membrane  and ear canal normal.  ?   Nose: Nose normal.  ?   Right Sinus: No maxillary sinus tenderness or frontal sinus tenderness.  ?   Left Sinus: No maxillary sinus tenderness or frontal sinus tenderness.  ?Eyes:  ?   Conjunctiva/sclera: Conjunctivae normal.  ?Neck:  ?   Thyroid: No thyromegaly.  ?   Vascular: No carotid bruit.  ?Cardiovascular:  ?   Rate and Rhythm: Normal rate and regular rhythm.  ?   Heart sounds: Normal heart sounds.  ?Pulmonary:  ?   Effort: Pulmonary effort is normal.  ?   Breath sounds: Normal breath sounds. No wheezing or rales.  ?Abdominal:  ?   General: Bowel sounds are normal.  ?   Palpations: Abdomen is soft.  ?   Tenderness: There is no abdominal tenderness.  ?Musculoskeletal:     ?   General: Normal range of motion.  ?   Cervical back: Neck supple.  ?Skin: ?   General: Skin is warm and dry.  ?Neurological:  ?   Mental Status: He is alert and oriented to person, place, and time.  ?   Cranial Nerves: No cranial nerve deficit.  ?   Deep Tendon Reflexes: Reflexes are normal and symmetric.  ?Psychiatric:     ?   Mood and Affect: Mood normal.  ? ? ? ? ? ?   ?Assessment & Plan:  ? ? ? ? ?This visit occurred during the SARS-CoV-2 public health emergency.  Safety protocols were in place, including screening questions prior to the visit, additional usage of staff PPE, and extensive cleaning of exam room while observing appropriate contact time as indicated for disinfecting solutions.  ?

## 2022-01-23 DIAGNOSIS — H43812 Vitreous degeneration, left eye: Secondary | ICD-10-CM | POA: Diagnosis not present

## 2022-01-23 DIAGNOSIS — H25813 Combined forms of age-related cataract, bilateral: Secondary | ICD-10-CM | POA: Diagnosis not present

## 2022-01-23 DIAGNOSIS — H53143 Visual discomfort, bilateral: Secondary | ICD-10-CM | POA: Diagnosis not present

## 2022-01-23 DIAGNOSIS — H35371 Puckering of macula, right eye: Secondary | ICD-10-CM | POA: Diagnosis not present

## 2022-02-20 ENCOUNTER — Telehealth (INDEPENDENT_AMBULATORY_CARE_PROVIDER_SITE_OTHER): Payer: PPO | Admitting: Primary Care

## 2022-02-20 ENCOUNTER — Encounter: Payer: Self-pay | Admitting: Primary Care

## 2022-02-20 VITALS — Ht 59.0 in | Wt 231.0 lb

## 2022-02-20 DIAGNOSIS — H1033 Unspecified acute conjunctivitis, bilateral: Secondary | ICD-10-CM

## 2022-02-20 HISTORY — DX: Unspecified acute conjunctivitis, bilateral: H10.33

## 2022-02-20 MED ORDER — ERYTHROMYCIN 5 MG/GM OP OINT: 1.0000 "application " | TOPICAL_OINTMENT | Freq: Four times a day (QID) | OPHTHALMIC | 0 refills | Status: DC

## 2022-02-20 NOTE — Progress Notes (Signed)
Patient ID: Preston Thomas, male    DOB: Jul 28, 1951, 71 y.o.   MRN: [id number]  Virtual visit completed through [place], a video enabled telemedicine application. Due to national recommendations of social distancing due to COVID-19, a virtual visit is felt to be most appropriate for this patient at this time. Reviewed limitations, risks, security and privacy concerns of performing a virtual visit and the availability of in person appointments. I also reviewed that there may be a patient responsible charge related to this service. The patient agreed to proceed.   Patient location: home Provider location: [place] at [facility], office Persons participating in this virtual visit: patient, provider   If any vitals were documented, they were collected by patient at home unless specified below.    Ht '4\' 11"'$  (1.499 m)   Wt 231 lb (104.8 kg)   BMI 46.66 kg/m    CC: Itchy Eye Subjective:   HPI: Preston Thomas is a 71 y.o. male with a history of OSA, migraines, psoriasis, prediabetes presenting on 02/20/2022 for Itchy Eye (With d/c and swelling x 2 weeks )  Symptom onset 2-3 weeks ago with redness and itching to the bilateral eyes. His symptoms nearly resolved after 3-4 days but then returned. He continues to notice discomfort to bilateral eyes. Each morning he wakes up with wet mucous to the corner of the eye, clear/whitish.   He's also noticed rhinorrhea, post nasal drip, nasal congestion.   He's mostly used wet rags to his eyes. No use of OTC products.       Relevant past medical, surgical, family and social history reviewed and updated as indicated. Interim medical history since our last visit reviewed. Allergies and medications reviewed and updated. Outpatient Medications Prior to Visit  Medication Sig Dispense Refill   atorvastatin (LIPITOR) 20 MG tablet Take 1 tablet (20 mg total) by mouth daily. for cholesterol. 90 tablet 3   clobetasol ointment (TEMOVATE)  3.61 % Apply 1 application topically 2 (two) times daily. 30 g 0   diclofenac (VOLTAREN) 75 MG EC tablet Take 1 tablet (75 mg total) by mouth 2 (two) times daily. 60 tablet 2   fluocinonide (LIDEX) 0.05 % external solution Apply 1 application topically 2 (two) times daily. 60 mL 0   fluticasone (CUTIVATE) 0.05 % cream Apply topically 2 (two) times daily. 30 g 0   omeprazole (PRILOSEC) 20 MG capsule Take 20 mg by mouth daily.     SUMAtriptan (IMITREX) 100 MG tablet TAKE 1 TABLET BY MOUTH AT MIGRAINE ONSET,MAY REPEAT IN 2 HOURS IF HEADACHE PERSISTS/RECURS *DO NOT EXCEED '100MG'$  IN 24 HOURS** 10 tablet 0   tamsulosin (FLOMAX) 0.4 MG CAPS capsule TAKE 1 CAPSULE BY MOUTH ONCE DAILY 90 capsule 2   triamcinolone (KENALOG) 0.025 % cream Apply 1 application topically 2 (two) times daily.     vitamin B-12 (CYANOCOBALAMIN) 500 MCG tablet Take 500 mcg by mouth daily.     vitamin C (ASCORBIC ACID) 250 MG tablet Take 250 mg by mouth daily.     Zinc 100 MG TABS Take by mouth.     No facility-administered medications prior to visit.     Per HPI unless specifically indicated in ROS section below Review of Systems  Constitutional:  Negative for fever.  HENT:  Positive for congestion, postnasal drip and rhinorrhea.   Eyes:  Positive for discharge, redness and itching. Negative for visual disturbance.  Objective:  Ht '4\' 11"'$  (1.499 m)   Wt 231 lb (  104.8 kg)   BMI 46.66 kg/m   Wt Readings from Last 3 Encounters:  02/20/22 231 lb (104.8 kg)  12/19/21 231 lb 6.4 oz (105 kg)  11/28/21 229 lb (103.9 kg)       Physical exam: General: Alert and oriented x 3, no distress, does not appear sickly  Pulmonary: Speaks in complete sentences without increased work of breathing, no cough during visit.  Psychiatric: Normal mood, thought content, and behavior.  Eyes: Injection noted to bilateral eyes.  Exam is limited given virtual platform.  No obvious drainage noted.  No obvious swelling to eyelids.     Results  for orders placed or performed in visit on 12/19/21  Lipid panel  Result Value Ref Range   Cholesterol 139 0 - 200 mg/dL   Triglycerides 159.0 (H) 0.0 - 149.0 mg/dL   HDL 32.20 (L) >39.00 mg/dL   VLDL 31.8 0.0 - 40.0 mg/dL   LDL Cholesterol 75 0 - 99 mg/dL   Total CHOL/HDL Ratio 4    NonHDL 106.54   Comprehensive metabolic panel  Result Value Ref Range   Sodium 141 135 - 145 mEq/L   Potassium 4.0 3.5 - 5.1 mEq/L   Chloride 107 96 - 112 mEq/L   CO2 28 19 - 32 mEq/L   Glucose, Bld 104 (H) 70 - 99 mg/dL   BUN 15 6 - 23 mg/dL   Creatinine, Ser 1.01 0.40 - 1.50 mg/dL   Total Bilirubin 0.4 0.2 - 1.2 mg/dL   Alkaline Phosphatase 60 39 - 117 U/L   AST 17 0 - 37 U/L   ALT 26 0 - 53 U/L   Total Protein 7.0 6.0 - 8.3 g/dL   Albumin 4.3 3.5 - 5.2 g/dL   GFR 76.51 >60.00 mL/min   Calcium 9.4 8.4 - 10.5 mg/dL  CBC  Result Value Ref Range   WBC 10.8 (H) 4.0 - 10.5 K/uL   RBC 4.83 4.22 - 5.81 Mil/uL   Platelets 296.0 150.0 - 400.0 K/uL   Hemoglobin 14.4 13.0 - 17.0 g/dL   HCT 43.6 39.0 - 52.0 %   MCV 90.2 78.0 - 100.0 fl   MCHC 33.0 30.0 - 36.0 g/dL   RDW 14.4 11.5 - 15.5 %  POCT glycosylated hemoglobin (Hb A1C)  Result Value Ref Range   Hemoglobin A1C 5.8 (A) 4.0 - 5.6 %   HbA1c POC (<> result, manual entry)     HbA1c, POC (prediabetic range)     HbA1c, POC (controlled diabetic range)     Assessment & Plan:   Problem List Items Addressed This Visit   None    No orders of the defined types were placed in this encounter.  No orders of the defined types were placed in this encounter.   I discussed the assessment and treatment plan with the patient. The patient was provided an opportunity to ask questions and all were answered. The patient agreed with the plan and demonstrated an understanding of the instructions. The patient was advised to call back or seek an in-person evaluation if the symptoms worsen or if the condition fails to improve as anticipated.  Follow up  plan:  Apply the erythromycin eye ointment into each eye 4 times daily for 7 days.  Start taking Zyrtec/Claritin/Allegra once daily.  It was a pleasure to see you today!   Pleas [name], NP

## 2022-02-20 NOTE — Assessment & Plan Note (Signed)
Symptoms could very well be allergic conjunctivitis, however given duration of symptoms coupled with limited examination today we will proceed with bacterial conjunctivitis treatment.  Start erythromycin ophthalmic ointment, 4 times daily x7 days. Discussed instructions on use.  Start Zyrtec nightly. Return precautions provided.

## 2022-02-20 NOTE — Patient Instructions (Signed)
Apply the erythromycin eye ointment into each eye 4 times daily for 7 days.  Start taking Zyrtec/Claritin/Allegra once daily.  It was a pleasure to see you today!

## 2022-03-01 DIAGNOSIS — G4733 Obstructive sleep apnea (adult) (pediatric): Secondary | ICD-10-CM | POA: Diagnosis not present

## 2022-03-04 DIAGNOSIS — H5213 Myopia, bilateral: Secondary | ICD-10-CM | POA: Diagnosis not present

## 2022-03-04 DIAGNOSIS — H52203 Unspecified astigmatism, bilateral: Secondary | ICD-10-CM | POA: Diagnosis not present

## 2022-03-04 DIAGNOSIS — H25013 Cortical age-related cataract, bilateral: Secondary | ICD-10-CM | POA: Diagnosis not present

## 2022-03-04 DIAGNOSIS — H2513 Age-related nuclear cataract, bilateral: Secondary | ICD-10-CM | POA: Diagnosis not present

## 2022-03-27 ENCOUNTER — Encounter: Payer: Self-pay | Admitting: Family Medicine

## 2022-03-27 ENCOUNTER — Ambulatory Visit (INDEPENDENT_AMBULATORY_CARE_PROVIDER_SITE_OTHER): Payer: PPO | Admitting: Family Medicine

## 2022-03-27 VITALS — BP 120/84 | HR 78 | Temp 98.6°F | Ht 59.0 in | Wt 233.1 lb

## 2022-03-27 DIAGNOSIS — R9431 Abnormal electrocardiogram [ECG] [EKG]: Secondary | ICD-10-CM

## 2022-03-27 DIAGNOSIS — R5383 Other fatigue: Secondary | ICD-10-CM | POA: Diagnosis not present

## 2022-03-27 DIAGNOSIS — R6889 Other general symptoms and signs: Secondary | ICD-10-CM | POA: Diagnosis not present

## 2022-03-27 DIAGNOSIS — R42 Dizziness and giddiness: Secondary | ICD-10-CM | POA: Diagnosis not present

## 2022-03-27 DIAGNOSIS — R55 Syncope and collapse: Secondary | ICD-10-CM

## 2022-03-27 DIAGNOSIS — R609 Edema, unspecified: Secondary | ICD-10-CM | POA: Diagnosis not present

## 2022-03-27 DIAGNOSIS — Z1329 Encounter for screening for other suspected endocrine disorder: Secondary | ICD-10-CM | POA: Diagnosis not present

## 2022-03-27 HISTORY — DX: Syncope and collapse: R55

## 2022-03-27 NOTE — Telephone Encounter (Signed)
I spoke with pt; today pts BP is elevated and pt does feel lightheaded. No CP, SOB or H/A. Pt scheduled appt with Dr Diona Browner 03/27/22 at 2 PM. Pt will be at office 1:45 to get cked in. UC & ED precautions given and pt voiced understanding. Sending note to Dr Diona Browner and Butch Penny CMA.

## 2022-03-27 NOTE — Telephone Encounter (Signed)
Noted  

## 2022-03-27 NOTE — Progress Notes (Signed)
Patient ID: Preston Thomas, male    DOB: September 06, 1951, 71 y.o.   MRN: [id number]  This visit was conducted in person.  Ht '4\' 11"'$  (1.499 m)   BMI 46.66 kg/m  Blood pressure 120/84, pulse 78, temperature 98.6 F (37 C), temperature source Oral, height '4\' 11"'$  (1.499 m), weight 233 lb 1 oz (105.7 kg), SpO2 94 %.    CC:  Chief Complaint  Patient presents with   Dizziness   Hypertension    Elevated BP last night    Subjective:   HPI: Harper Smoker III is a 71 y.o. male patient of [name], NP with history of migraines, obstructive sleep apnea, prediabetes and hyperlipidemia on 03/27/2022 for Dizziness and Hypertension (Elevated BP last night)   He reports last week he noted  elevated HR, dizziness, surge of adrenaline  He was on vacation in [place], heat index 100.  Went golfing for an 1 hour in direct sun.  Apple watch showed HR 135-140.   Rested in  shade.. drank lots of gatorade... felt better over time.  Next day on beach felt very dizzy.. lightheaded (presyncopal) not vertigo.  No issues driving home but when standing at check in started feel dizzy. Seems to be worse when going from sitting to standing.   At home Cuff BP showing 165/110    No headaches, does have pressure  in base of neck( similar to when had migraine begin in past).   No CP,  no SOB,  He has history of prostate cancer and recently told he does not have to take Eliguard. He has no history of hypertension per his problem list. No new medications He has had a small amount of weight gain in the last month: 2 pounds. Wt Readings from Last 3 Encounters:  03/27/22 233 lb 1 oz (105.7 kg)  02/20/22 231 lb (104.8 kg)  12/19/21 231 lb 6.4 oz (105 kg)   BP Readings from Last 3 Encounters:  03/27/22 120/84  12/19/21 128/80  11/28/21 139/81        Relevant past medical, surgical, family and social history reviewed and updated as indicated. Interim medical history since our last visit  reviewed. Allergies and medications reviewed and updated. Outpatient Medications Prior to Visit  Medication Sig Dispense Refill   atorvastatin (LIPITOR) 20 MG tablet Take 1 tablet (20 mg total) by mouth daily. for cholesterol. 90 tablet 3   clobetasol ointment (TEMOVATE) 5.63 % Apply 1 application topically 2 (two) times daily. 30 g 0   diclofenac (VOLTAREN) 75 MG EC tablet Take 1 tablet (75 mg total) by mouth 2 (two) times daily. 60 tablet 2   erythromycin ophthalmic ointment Place 1 application. into both eyes 4 (four) times daily. For 7 days. 3.5 g 0   fluocinonide (LIDEX) 0.05 % external solution Apply 1 application topically 2 (two) times daily. 60 mL 0   fluticasone (CUTIVATE) 0.05 % cream Apply topically 2 (two) times daily. 30 g 0   omeprazole (PRILOSEC) 20 MG capsule Take 20 mg by mouth daily.     SUMAtriptan (IMITREX) 100 MG tablet TAKE 1 TABLET BY MOUTH AT MIGRAINE ONSET,MAY REPEAT IN 2 HOURS IF HEADACHE PERSISTS/RECURS *DO NOT EXCEED '100MG'$  IN 24 HOURS** 10 tablet 0   tamsulosin (FLOMAX) 0.4 MG CAPS capsule TAKE 1 CAPSULE BY MOUTH ONCE DAILY 90 capsule 2   triamcinolone (KENALOG) 0.025 % cream Apply 1 application topically 2 (two) times daily.     vitamin B-12 (CYANOCOBALAMIN) 500  MCG tablet Take 500 mcg by mouth daily.     vitamin C (ASCORBIC ACID) 250 MG tablet Take 250 mg by mouth daily.     Zinc 100 MG TABS Take by mouth.     No facility-administered medications prior to visit.     Per HPI unless specifically indicated in ROS section below Review of Systems  Constitutional:  Negative for fatigue and fever.  HENT:  Negative for ear pain.   Eyes:  Negative for pain.  Respiratory:  Negative for cough and shortness of breath.   Cardiovascular:  Negative for chest pain, palpitations and leg swelling.  Gastrointestinal:  Negative for abdominal pain.  Genitourinary:  Negative for dysuria.  Musculoskeletal:  Negative for arthralgias.  Neurological:  Positive for dizziness and  light-headedness. Negative for tremors, seizures, syncope, facial asymmetry, speech difficulty, weakness, numbness and headaches.  Psychiatric/Behavioral:  Positive for dysphoric mood.    Objective:  Ht '4\' 11"'$  (1.499 m)   BMI 46.66 kg/m   Wt Readings from Last 3 Encounters:  02/20/22 231 lb (104.8 kg)  12/19/21 231 lb 6.4 oz (105 kg)  11/28/21 229 lb (103.9 kg)      Physical Exam Constitutional:      Appearance: He is well-developed. He is obese.  HENT:     Head: Normocephalic.     Right Ear: Hearing normal.     Left Ear: Hearing normal.     Nose: Nose normal.  Neck:     Thyroid: No thyroid mass or thyromegaly.     Vascular: No carotid bruit.     Trachea: Trachea normal.  Cardiovascular:     Rate and Rhythm: Normal rate and regular rhythm.     Pulses: Normal pulses.     Heart sounds: Heart sounds not distant. No murmur heard.    No friction rub. No gallop.     Comments: No peripheral edema Pulmonary:     Effort: Pulmonary effort is normal. No respiratory distress.     Breath sounds: Normal breath sounds.  Skin:    General: Skin is warm and dry.     Findings: No rash.  Psychiatric:        Speech: Speech normal.        Behavior: Behavior normal.        Thought Content: Thought content normal.       Results for orders placed or performed in visit on 12/19/21  Lipid panel  Result Value Ref Range   Cholesterol 139 0 - 200 mg/dL   Triglycerides 159.0 (H) 0.0 - 149.0 mg/dL   HDL 32.20 (L) >39.00 mg/dL   VLDL 31.8 0.0 - 40.0 mg/dL   LDL Cholesterol 75 0 - 99 mg/dL   Total CHOL/HDL Ratio 4    NonHDL 106.54   Comprehensive metabolic panel  Result Value Ref Range   Sodium 141 135 - 145 mEq/L   Potassium 4.0 3.5 - 5.1 mEq/L   Chloride 107 96 - 112 mEq/L   CO2 28 19 - 32 mEq/L   Glucose, Bld 104 (H) 70 - 99 mg/dL   BUN 15 6 - 23 mg/dL   Creatinine, Ser 1.01 0.40 - 1.50 mg/dL   Total Bilirubin 0.4 0.2 - 1.2 mg/dL   Alkaline Phosphatase 60 39 - 117 U/L   AST 17 0 -  37 U/L   ALT 26 0 - 53 U/L   Total Protein 7.0 6.0 - 8.3 g/dL   Albumin 4.3 3.5 - 5.2 g/dL   GFR 76.51 >  60.00 mL/min   Calcium 9.4 8.4 - 10.5 mg/dL  CBC  Result Value Ref Range   WBC 10.8 (H) 4.0 - 10.5 K/uL   RBC 4.83 4.22 - 5.81 Mil/uL   Platelets 296.0 150.0 - 400.0 K/uL   Hemoglobin 14.4 13.0 - 17.0 g/dL   HCT 43.6 39.0 - 52.0 %   MCV 90.2 78.0 - 100.0 fl   MCHC 33.0 30.0 - 36.0 g/dL   RDW 14.4 11.5 - 15.5 %  POCT glycosylated hemoglobin (Hb A1C)  Result Value Ref Range   Hemoglobin A1C 5.8 (A) 4.0 - 5.6 %   HbA1c POC (<> result, manual entry)     HbA1c, POC (prediabetic range)     HbA1c, POC (controlled diabetic range)       COVID 19 screen:  No recent travel or known exposure to COVID19 The patient denies respiratory symptoms of COVID 19 at this time. The importance of social distancing was discussed today.   Assessment and Plan   EKG: unchanged from previous tracings, normal sinus rhythm, occ ectopic beat, Q waves in inferior leads.  Problem List Items Addressed This Visit     Abnormal EKG     Q waves seen in inferior leads today as well as in 2020 EKG appear stable.  Potentially this past episode of ischemia could have been associated with his severe COVID that resulted in 5 days of hospitalization.  Given he is high risk for cardiovascular disease given his morbid obesity, I have suggested follow-up with cardiology for consideration of cardiac stress testing.      Relevant Orders   Ambulatory referral to Cardiology   Brain natriuretic peptide   Syncope, near    Acute, recurrent with several episodes.  Also associated with recurrent dizziness when standing for a while or going from sitting to standing.  His initial event was associated with possible heat exhaustion and dehydration.  It is unclear as to why he is continuing to have issues.  We will evaluate with CBC, thyroid, complete metabolic panel.  His EKG today did show an ectopic ventricular beat which  was occasional so not clearly associated with his symptoms.  There were also also inferior Q waves discussed elsewhere.      Relevant Orders   CBC with Differential/Platelet   Comprehensive metabolic panel   TSH   EKG 12-Lead (Completed)   Ambulatory referral to Cardiology   Brain natriuretic peptide   Other Visit Diagnoses     Lightheadedness    -  Primary   Relevant Orders   EKG 12-Lead (Completed)        Eliezer Lofts, MD

## 2022-03-27 NOTE — Patient Instructions (Signed)
Push water intake. Please stop at the lab to have labs drawn.

## 2022-03-27 NOTE — Assessment & Plan Note (Signed)
Acute, recurrent with several episodes.  Also associated with recurrent dizziness when standing for a while or going from sitting to standing.  His initial event was associated with possible heat exhaustion and dehydration.  It is unclear as to why he is continuing to have issues.  We will evaluate with CBC, thyroid, complete metabolic panel.  His EKG today did show an ectopic ventricular beat which was occasional so not clearly associated with his symptoms.  There were also also inferior Q waves discussed elsewhere.

## 2022-03-27 NOTE — Assessment & Plan Note (Signed)
  Q waves seen in inferior leads today as well as in 2020 EKG appear stable.  Potentially this past episode of ischemia could have been associated with his severe COVID that resulted in 5 days of hospitalization.  Given he is high risk for cardiovascular disease given his morbid obesity, I have suggested follow-up with cardiology for consideration of cardiac stress testing.

## 2022-03-28 LAB — CBC WITH DIFFERENTIAL/PLATELET
Absolute Monocytes: 812 cells/uL (ref 200–950)
Basophils Absolute: 98 cells/uL (ref 0–200)
Basophils Relative: 1.2 %
Eosinophils Absolute: 279 cells/uL (ref 15–500)
Eosinophils Relative: 3.4 %
HCT: 43.6 % (ref 38.5–50.0)
Hemoglobin: 14.2 g/dL (ref 13.2–17.1)
Lymphs Abs: 1550 cells/uL (ref 850–3900)
MCH: 29.5 pg (ref 27.0–33.0)
MCHC: 32.6 g/dL (ref 32.0–36.0)
MCV: 90.6 fL (ref 80.0–100.0)
MPV: 9.9 fL (ref 7.5–12.5)
Monocytes Relative: 9.9 %
Neutro Abs: 5461 cells/uL (ref 1500–7800)
Neutrophils Relative %: 66.6 %
Platelets: 326 10*3/uL (ref 140–400)
RBC: 4.81 10*6/uL (ref 4.20–5.80)
RDW: 12.9 % (ref 11.0–15.0)
Total Lymphocyte: 18.9 %
WBC: 8.2 10*3/uL (ref 3.8–10.8)

## 2022-03-28 LAB — BRAIN NATRIURETIC PEPTIDE: Brain Natriuretic Peptide: 29 pg/mL (ref <100)

## 2022-03-28 LAB — COMPREHENSIVE METABOLIC PANEL
AG Ratio: 1.6 (calc) (ref 1.0–2.5)
ALT: 23 U/L (ref 9–46)
AST: 14 U/L (ref 10–35)
Albumin: 4.2 g/dL (ref 3.6–5.1)
Alkaline phosphatase (APISO): 60 U/L (ref 35–144)
BUN: 16 mg/dL (ref 7–25)
CO2: 25 mmol/L (ref 20–32)
Calcium: 9.3 mg/dL (ref 8.6–10.3)
Chloride: 106 mmol/L (ref 98–110)
Creat: 1.14 mg/dL (ref 0.70–1.35)
Globulin: 2.6 g/dL (calc) (ref 1.9–3.7)
Glucose, Bld: 95 mg/dL (ref 65–99)
Potassium: 4.6 mmol/L (ref 3.5–5.3)
Sodium: 143 mmol/L (ref 135–146)
Total Bilirubin: 0.3 mg/dL (ref 0.2–1.2)
Total Protein: 6.8 g/dL (ref 6.1–8.1)

## 2022-03-28 LAB — TSH: TSH: 2.14 mIU/L (ref 0.40–4.50)

## 2022-03-30 ENCOUNTER — Encounter: Payer: Self-pay | Admitting: Family Medicine

## 2022-04-14 DIAGNOSIS — H25012 Cortical age-related cataract, left eye: Secondary | ICD-10-CM | POA: Diagnosis not present

## 2022-04-14 DIAGNOSIS — H269 Unspecified cataract: Secondary | ICD-10-CM | POA: Diagnosis not present

## 2022-04-14 DIAGNOSIS — H25812 Combined forms of age-related cataract, left eye: Secondary | ICD-10-CM | POA: Diagnosis not present

## 2022-04-14 DIAGNOSIS — H2512 Age-related nuclear cataract, left eye: Secondary | ICD-10-CM | POA: Diagnosis not present

## 2022-04-24 ENCOUNTER — Inpatient Hospital Stay: Payer: PPO | Attending: Oncology

## 2022-04-24 DIAGNOSIS — C61 Malignant neoplasm of prostate: Secondary | ICD-10-CM | POA: Insufficient documentation

## 2022-04-24 LAB — PSA: Prostatic Specific Antigen: 0.11 ng/mL (ref 0.00–4.00)

## 2022-04-28 ENCOUNTER — Ambulatory Visit: Payer: PPO | Admitting: Radiation Oncology

## 2022-04-28 DIAGNOSIS — H25811 Combined forms of age-related cataract, right eye: Secondary | ICD-10-CM | POA: Diagnosis not present

## 2022-04-28 DIAGNOSIS — H269 Unspecified cataract: Secondary | ICD-10-CM | POA: Diagnosis not present

## 2022-04-28 DIAGNOSIS — H25011 Cortical age-related cataract, right eye: Secondary | ICD-10-CM | POA: Diagnosis not present

## 2022-04-28 DIAGNOSIS — H2511 Age-related nuclear cataract, right eye: Secondary | ICD-10-CM | POA: Diagnosis not present

## 2022-05-06 ENCOUNTER — Ambulatory Visit
Admission: RE | Admit: 2022-05-06 | Discharge: 2022-05-06 | Disposition: A | Discharge status: Home / Self Care | Payer: PPO | Source: Ambulatory Visit | Attending: Radiation Oncology | Admitting: Radiation Oncology

## 2022-05-06 VITALS — BP 145/77 | HR 76 | Temp 96.4°F | Resp 18 | Ht 59.0 in | Wt 248.4 lb

## 2022-05-06 DIAGNOSIS — Z191 Hormone sensitive malignancy status: Secondary | ICD-10-CM | POA: Diagnosis not present

## 2022-05-06 DIAGNOSIS — C61 Malignant neoplasm of prostate: Secondary | ICD-10-CM

## 2022-05-06 NOTE — Progress Notes (Signed)
Radiation Oncology Follow up Note  Name: Preston Thomas   Date:   05/06/2022 MRN:  [id number] DOB: [birth year]    This 71 y.o. male presents to the clinic today for 3-year follow-up status post I-125 interstitial implant for boost and patient treated with both external beam treatment as well as pelvic lymph node for Gleason 8 (3+5) adenocarcinoma presenting with a PSA of 12.7.  REFERRING PROVIDER: Pleas [name], NP  HPI: Patient is a 71 year old male now 3 years having completed both external beam treatment as well as I-125 interstitial implant for boost for Gleason 8 (3+5) adenocarcinoma.  Seen today in routine follow-up he is doing well.  He specifically denies any increased lower urinary tract symptoms diarrhea or fatigue.  His most recent PSA remains absolutely stable at 0.1.Preston Thomas  COMPLICATIONS OF TREATMENT: none  FOLLOW UP COMPLIANCE: keeps appointments   PHYSICAL EXAM:  BP (!) 145/77   Pulse 76   Temp (!) 96.4 F (35.8 C)   Resp 18   Ht '4\' 11"'$  (1.499 m)   Wt 248 lb 6.4 oz (112.7 kg)   BMI 50.17 kg/m  Well-developed well-nourished patient in NAD. HEENT reveals PERLA, EOMI, discs not visualized.  Oral cavity is clear. No oral mucosal lesions are identified. Neck is clear without evidence of cervical or supraclavicular adenopathy. Lungs are clear to A&P. Cardiac examination is essentially unremarkable with regular rate and rhythm without murmur rub or thrill. Abdomen is benign with no organomegaly or masses noted. Motor sensory and DTR levels are equal and symmetric in the upper and lower extremities. Cranial nerves II through XII are grossly intact. Proprioception is intact. No peripheral adenopathy or edema is identified. No motor or sensory levels are noted. Crude visual fields are within normal range.  RADIOLOGY RESULTS: No current films for review  PLAN: Present time patient is doing well under excellent biochemical control of his prostate cancer.  I am pleased with  his overall progress.  I will turn follow-up care over to Dr. Bernardo Heater since he is seeing him twice a year.  Be happy to reevaluate him at any time.  Patient knows to call with any concerns.  I would like to take this opportunity to thank you for allowing me to participate in the care of your patient.Noreene Filbert, MD

## 2022-05-07 ENCOUNTER — Ambulatory Visit (INDEPENDENT_AMBULATORY_CARE_PROVIDER_SITE_OTHER): Payer: PPO | Admitting: Family Medicine

## 2022-05-07 VITALS — BP 120/70 | HR 92 | Temp 97.5°F | Ht 59.0 in | Wt 233.4 lb

## 2022-05-07 DIAGNOSIS — M542 Cervicalgia: Secondary | ICD-10-CM | POA: Diagnosis not present

## 2022-05-07 MED ORDER — PREDNISONE 20 MG PO TABS: ORAL_TABLET | ORAL | 0 refills | Status: DC
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
## 2022-05-08 ENCOUNTER — Encounter: Payer: Self-pay | Admitting: Family Medicine

## 2022-05-17 ENCOUNTER — Encounter: Payer: Self-pay | Admitting: Family Medicine

## 2022-05-17 DIAGNOSIS — M542 Cervicalgia: Secondary | ICD-10-CM

## 2022-05-19 ENCOUNTER — Encounter: Payer: Self-pay | Admitting: Cardiology

## 2022-05-19 ENCOUNTER — Ambulatory Visit: Payer: PPO | Admitting: Cardiology

## 2022-05-19 VITALS — BP 143/89 | HR 78 | Ht 59.0 in | Wt 236.5 lb

## 2022-05-19 DIAGNOSIS — R42 Dizziness and giddiness: Secondary | ICD-10-CM | POA: Diagnosis not present

## 2022-05-19 DIAGNOSIS — R9431 Abnormal electrocardiogram [ECG] [EKG]: Secondary | ICD-10-CM | POA: Diagnosis not present

## 2022-05-19 DIAGNOSIS — R030 Elevated blood-pressure reading, without diagnosis of hypertension: Secondary | ICD-10-CM

## 2022-05-19 NOTE — Progress Notes (Unsigned)
Cardiology Office Note:    Date:  05/19/2022   ID:  Preston Thomas, DOB [birth year], MRN [id number]  PCP:  Pleas [name], NP   [place] Providers Cardiologist:  None     Referring MD: Jinny Sanders, MD   Chief Complaint  Patient presents with   Other    Syncope. Meds reviewed verbally with pt.   Preston Thomas is a 71 y.o. male who is being seen today for the evaluation of syncope and abnormal EKG at the request of Diona Browner, Mervyn Gay, MD.   History of Present Illness:    Preston Thomas is a 71 y.o. male with a hx of GERD, morbid obesity, prostate cancer  who presents due to presyncope and abnormal EKG.  He was vacationing in [place] 2 months ago, playing golf with a high heat index of over 100.  States feeling dizzy, son who is an Therapist, sports took him to the shade.  His heart rates were noted to be elevated over 100, he states not passing out but was close.  He was given fluids, followed up with PCP upon return.  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  This was unchanged from prior EKG in 2020.  He denies any further episodes of dizziness, over the past month and a half.  He feels well, denies chest pain, working on eating healthy.  States retaining fluid due to being on Lupron for about 3 years.  Lupron was stopped about 10 months ago.   Past Medical History:  Diagnosis Date   COLONIC POLYPS, HX OF 08/31/2007   Qualifier: Diagnosis of  By: Silvio Pate MD, [facility]    DIVERTICULITIS, HX OF 08/31/2007   Qualifier: Diagnosis of  By: Joesph Fillers CMA (AAMA), Natasha     DIVERTICULOSIS, COLON 08/31/2007   Qualifier: Diagnosis of  By: Silvio Pate MD, [facility]    Dyspnea    Related to Covid   GERD (gastroesophageal reflux disease)    History of kidney stones    Pneumonia due to COVID-19 virus 04/19/2019   RENAL CALCULUS, HX OF 08/31/2007   Qualifier: Diagnosis of  By: Joesph Fillers CMA (AAMA), Ronny Bacon      Past Surgical  History:  Procedure Laterality Date   [place]   RADIOACTIVE SEED IMPLANT N/A 06/27/2019   Procedure: RADIOACTIVE SEED IMPLANT/BRACHYTHERAPY IMPLANT;  Surgeon: Abbie Sons, MD;  Location: [facility] ORS;  Service: Urology;  Laterality: N/A;    Current Medications: Current Meds  Medication Sig   atorvastatin (LIPITOR) 20 MG tablet Take 1 tablet (20 mg total) by mouth daily. for cholesterol.   clobetasol ointment (TEMOVATE) 1.66 % Apply 1 application topically 2 (two) times daily.   diclofenac (VOLTAREN) 75 MG EC tablet Take 1 tablet (75 mg total) by mouth 2 (two) times daily.   fluocinonide (LIDEX) 0.05 % external solution Apply 1 application topically 2 (two) times daily.   fluticasone (CUTIVATE) 0.05 % cream Apply topically 2 (two) times daily.   omeprazole (PRILOSEC) 20 MG capsule Take 20 mg by mouth daily.   SUMAtriptan (IMITREX) 100 MG tablet TAKE 1 TABLET BY MOUTH AT MIGRAINE ONSET,MAY REPEAT IN 2 HOURS IF HEADACHE PERSISTS/RECURS *DO NOT EXCEED '100MG'$  IN 24 HOURS**   tamsulosin (FLOMAX) 0.4 MG CAPS capsule TAKE 1 CAPSULE BY MOUTH ONCE DAILY   triamcinolone (KENALOG) 0.025 % cream Apply 1 application topically 2 (two) times daily.   vitamin B-12 (  CYANOCOBALAMIN) 500 MCG tablet Take 500 mcg by mouth daily.   vitamin C (ASCORBIC ACID) 250 MG tablet Take 250 mg by mouth daily.   Zinc 100 MG TABS Take by mouth.     Allergies:   Penicillins   Social History   Socioeconomic History   Marital status: Married    Spouse name: Not on file   Number of children: Not on file   Years of education: Not on file   Highest education level: Not on file  Occupational History   Not on file  Tobacco Use   Smoking status: Former    Packs/day: 1.50    Years: 10.00    Total pack years: 15.00    Types: Cigarettes    Quit date: 09/28/1984    Years since quitting: 37.6   Smokeless tobacco: Never  Vaping Use   Vaping Use: Never used  Substance and Sexual Activity    Alcohol use: Not Currently   Drug use: Never   Sexual activity: Yes  Other Topics Concern   Not on file  Social History Narrative   Not on file   Social Determinants of Health   Financial Resource Strain: Low Risk  (09/12/2021)   Overall Financial Resource Strain (CARDIA)    Difficulty of Paying Living Expenses: Not hard at all  Food Insecurity: No Food Insecurity (09/12/2021)   Hunger Vital Sign    Worried About Running Out of Food in the Last Year: Never true    [place] in the Last Year: Never true  Transportation Needs: No Transportation Needs (09/12/2021)   PRAPARE - Hydrologist (Medical): No    Lack of Transportation (Non-Medical): No  Physical Activity: Sufficiently Active (09/12/2021)   Exercise Vital Sign    Days of Exercise per Week: 4 days    Minutes of Exercise per Session: 50 min  Stress: No Stress Concern Present (09/12/2021)   [place]    Feeling of Stress : Not at all  Social Connections: [place] (09/12/2021)   Social Connection and Isolation Panel [NHANES]    Frequency of Communication with Friends and Family: More than three times a week    Frequency of Social Gatherings with Friends and Family: More than three times a week    Attends Religious Services: More than 4 times per year    Active Member of [employer] or Organizations: Yes    Attends Music therapist: More than 4 times per year    Marital Status: Married     Family History: The patient's family history includes Early death in his father; Heart failure in his sister; Kidney disease in his father.  ROS:   Please see the history of present illness.     All other systems reviewed and are negative.  EKGs/Labs/Other Studies Reviewed:    The following studies were reviewed today:   EKG:  EKG is  ordered today.  The ekg ordered today demonstrates normal sinus rhythm,  possible old inferior infarct  Recent Labs: 03/27/2022: ALT 23; Brain Natriuretic Peptide 29; BUN 16; Creat 1.14; Hemoglobin 14.2; Platelets 326; Potassium 4.6; Sodium 143; TSH 2.14  Recent Lipid Panel    Component Value Date/Time   CHOL 139 12/19/2021 1246   TRIG 159.0 (H) 12/19/2021 1246   HDL 32.20 (L) 12/19/2021 1246   CHOLHDL 4 12/19/2021 1246   VLDL 31.8 12/19/2021 1246   LDLCALC 75 12/19/2021 1246  LDLDIRECT 167.0 05/15/2019 1100     Risk Assessment/Calculations:    HYPERTENSION CONTROL Vitals:   05/19/22 0936 05/19/22 0938  BP: 138/85 (!) 143/89    The patient's blood pressure is elevated above target today. {Click here if intervention needs to be changed Refresh Note :1}  In order to address the patient's elevated BP: The blood pressure is usually elevated in clinic.  Blood pressures monitored at home have been optimal.; Blood pressure will be monitored at home to determine if medication changes need to be made.            Physical Exam:    VS:  BP (!) 143/89 (BP Location: Right Arm, Patient Position: Sitting, Cuff Size: Large)   Pulse 78   Ht '4\' 11"'$  (1.499 m)   Wt 236 lb 8 oz (107.3 kg)   SpO2 97%   BMI 47.77 kg/m     Wt Readings from Last 3 Encounters:  05/19/22 236 lb 8 oz (107.3 kg)  05/07/22 233 lb 6 oz (105.9 kg)  05/06/22 248 lb 6.4 oz (112.7 kg)     GEN:  Well nourished, well developed in no acute distress HEENT: Normal NECK: No JVD; No carotid bruits CARDIAC: RRR, no murmurs, rubs, gallops RESPIRATORY:  Clear to auscultation without rales, wheezing or rhonchi  ABDOMEN: Soft, non-tender, non-distended MUSCULOSKELETAL:  No edema; No deformity  SKIN: Warm and dry NEUROLOGIC:  Alert and oriented x 3 PSYCHIATRIC:  Normal affect   ASSESSMENT:    1. Abnormal EKG   2. Dizziness   3. Elevated BP without diagnosis of hypertension   4. Morbid obesity ([place])    PLAN:    In order of problems listed above:  Abnormal EKG, possible old inferior  infarct, denies symptoms of chest pain or shortness of breath.  Get echocardiogram. Dizziness, etiology likely dehydration, orthostasis.  No further episodes over the past 6 weeks, continue to monitor. BP elevated today, usually controlled.  Continue to monitor. Morbid obesity, low-calorie diet, weight loss advised.  Follow-up after echocardiogram, if echo normal, follow-up as needed       Medication Adjustments/Labs and Tests Ordered: Current medicines are reviewed at length with the patient today.  Concerns regarding medicines are outlined above.  Orders Placed This Encounter  Procedures   ECHOCARDIOGRAM COMPLETE   No orders of the defined types were placed in this encounter.   Patient Instructions  Medication Instructions:   Your physician recommends that you continue on your current medications as directed. Please refer to the Current Medication list given to you today.   *If you need a refill on your cardiac medications before your next appointment, please call your pharmacy*   Testing/Procedures:  Your physician has requested that you have an echocardiogram. Echocardiography is a painless test that uses sound waves to create images of your heart. It provides your doctor with information about the size and shape of your heart and how well your heart's chambers and valves are working. This procedure takes approximately one hour. There are no restrictions for this procedure.   Follow-Up: At [facility], you and your health needs are our priority.  As part of our continuing mission to provide you with exceptional heart care, we have created designated Provider Care Teams.  These Care Teams include your primary Cardiologist (physician) and Advanced Practice Providers (APPs -  Physician Assistants and Nurse Practitioners) who all work together to provide you with the care you need, when you need it.  We recommend signing up  for the patient portal called "MyChart".  Sign up  information is provided on this After Visit Summary.  MyChart is used to connect with patients for Virtual Visits (Telemedicine).  Patients are able to view lab/test results, encounter notes, upcoming appointments, etc.  Non-urgent messages can be sent to your provider as well.   To learn more about what you can do with MyChart, go to [email or web address].    Your next appointment:   Follow up after testing   The format for your next appointment:   In Person  Provider:   You may see Kate Sable, MD or one of the following Advanced Practice Providers on your designated Care Team:   Murray Hodgkins, NP Christell Faith, PA-C Cadence Kathlen Mody, [place]    Other Instructions   Important Information About Sugar         Signed, Kate Sable, MD  05/19/2022 10:09 AM    [place]

## 2022-05-19 NOTE — Patient Instructions (Signed)
Medication Instructions:   Your physician recommends that you continue on your current medications as directed. Please refer to the Current Medication list given to you today.   *If you need a refill on your cardiac medications before your next appointment, please call your pharmacy*   Testing/Procedures:  Your physician has requested that you have an echocardiogram. Echocardiography is a painless test that uses sound waves to create images of your heart. It provides your doctor with information about the size and shape of your heart and how well your heart's chambers and valves are working. This procedure takes approximately one hour. There are no restrictions for this procedure.   Follow-Up: At [facility], you and your health needs are our priority.  As part of our continuing mission to provide you with exceptional heart care, we have created designated Provider Care Teams.  These Care Teams include your primary Cardiologist (physician) and Advanced Practice Providers (APPs -  Physician Assistants and Nurse Practitioners) who all work together to provide you with the care you need, when you need it.  We recommend signing up for the patient portal called "MyChart".  Sign up information is provided on this After Visit Summary.  MyChart is used to connect with patients for Virtual Visits (Telemedicine).  Patients are able to view lab/test results, encounter notes, upcoming appointments, etc.  Non-urgent messages can be sent to your provider as well.   To learn more about what you can do with MyChart, go to [email or web address].    Your next appointment:   Follow up after testing   The format for your next appointment:   In Person  Provider:   You may see Kate Sable, MD or one of the following Advanced Practice Providers on your designated Care Team:   Murray Hodgkins, NP Christell Faith, PA-C Cadence Kathlen Mody, [place]    Other Instructions   Important Information About  Sugar

## 2022-05-21 NOTE — Addendum Note (Signed)
Addended by: Britt Bottom on: 05/21/2022 10:26 AM   Modules accepted: Orders

## 2022-05-29 ENCOUNTER — Ambulatory Visit (INDEPENDENT_AMBULATORY_CARE_PROVIDER_SITE_OTHER): Payer: PPO | Admitting: Family Medicine

## 2022-05-29 ENCOUNTER — Encounter (INDEPENDENT_AMBULATORY_CARE_PROVIDER_SITE_OTHER): Payer: Self-pay | Admitting: Family Medicine

## 2022-05-29 ENCOUNTER — Encounter (INDEPENDENT_AMBULATORY_CARE_PROVIDER_SITE_OTHER): Payer: Self-pay

## 2022-05-29 VITALS — BP 127/78 | HR 79 | Temp 98.1°F | Ht 60.0 in | Wt 230.0 lb

## 2022-05-29 DIAGNOSIS — E785 Hyperlipidemia, unspecified: Secondary | ICD-10-CM

## 2022-05-29 DIAGNOSIS — I10 Essential (primary) hypertension: Secondary | ICD-10-CM | POA: Insufficient documentation

## 2022-05-29 DIAGNOSIS — Z6841 Body Mass Index (BMI) 40.0 and over, adult: Secondary | ICD-10-CM

## 2022-05-29 DIAGNOSIS — R7303 Prediabetes: Secondary | ICD-10-CM

## 2022-05-29 DIAGNOSIS — Z0289 Encounter for other administrative examinations: Secondary | ICD-10-CM

## 2022-05-29 NOTE — Progress Notes (Addendum)
Office: [phone]  /  Fax: [phone]  Initial Visit  Preston Thomas was seen in clinic today to evaluate for obesity. He is interested in losing weight to improve overall health and reduce the risk of weight related complications.   He presents today to review program treatment options, initial physical assessment, and evaluation.      Past medical history includes:   Past Medical History:  Diagnosis Date   Bilateral lower extremity edema    Cancer ([place])    COLONIC POLYPS, HX OF 08/31/2007   Qualifier: Diagnosis of  By: Silvio Pate MD, [facility]    DIVERTICULITIS, HX OF 08/31/2007   Qualifier: Diagnosis of  By: Joesph Fillers CMA (AAMA), Natasha     DIVERTICULOSIS, COLON 08/31/2007   Qualifier: Diagnosis of  By: Silvio Pate MD, [facility]    Dyspnea    Related to Covid   Gallbladder problem    GERD (gastroesophageal reflux disease)    History of kidney stones    Pneumonia due to COVID-19 virus 04/19/2019   RENAL CALCULUS, HX OF 08/31/2007   Qualifier: Diagnosis of  By: Joesph Fillers CMA (AAMA), Natasha     Sleep apnea    Swallowing difficulty      Objective:   BP 127/78   Pulse 79   Temp 98.1 F (36.7 C)   Ht 5' (1.524 m)   Wt 230 lb (104.3 kg)   SpO2 97%   BMI 44.92 kg/m  He was weighed on the bioimpedance scale:  Body mass index is 44.92 kg/m.  General:  Alert, oriented and cooperative. Patient is in no acute distress.  Respiratory: Normal respiratory effort, no problems with respiration noted  Extremities: Normal range of motion.    Mental Status: Normal mood and affect. Normal behavior. Normal judgment and thought content.   Assessment and Plan:  1. Prediabetes  2. Hypertension, unspecified type - EKG 12-Lead; Future  3. Hyperlipidemia, unspecified hyperlipidemia type  4. Class 3 severe obesity with serious comorbidity and body mass index (BMI) of 45.0 to 49.9 in adult, unspecified obesity type ([place])      Obesity Treatment Plan:  He will work on  garnering support from family and friends to begin weight loss journey. Work on eliminating or reducing the presence of highly processed, calorie dense foods in the home. Complete provided nutritional and psychosocial assessment questionnaire.    Preston Thomas will follow up in the next 1-2 weeks to review the above steps and continue evaluation and treatment.  Obesity Education Performed Today:  He was weighed on the bioimpedance scale and results were discussed and documented in the synopsis.  We discussed obesity as a disease and the importance of a more detailed evaluation of all the factors contributing to the disease.  We discussed the importance of long term lifestyle changes which include nutrition, exercise and behavioral modifications as well as the importance of customizing this to his specific health and social needs.  We discussed the benefits of reaching a healthier weight to alleviate the symptoms of existing conditions and reduce the risks of the biomechanical, metabolic and psychological effects of obesity.  We discussed the goals of this program is to improve his overall health and not simply achieve a specific BMI.  Frequent visits are very important to patient success. I plan to see him every 2 weeks for the first 3 months and then evaluate the visit frequency after that time. I explained obesity is a life-long chronic disease and long term treatments  would be required. Medications to help him follow his eating plan may be offered as appropriate but are not required. All medication decisions will be made together after the initial workup is done and benefits and side effects are discussed in depth.  The clinic rules were reviewed including the late policy, cancellation policy, no show and program fees.  Preston Thomas appears to be in the action stage of change and states they are ready to start intensive lifestyle modifications and behavioral  modifications.  50 minutes was spent today on this visit including the above counseling, pre-visit chart review, and post-visit documentation.  Dennard Nip, MD

## 2022-06-10 ENCOUNTER — Encounter (INDEPENDENT_AMBULATORY_CARE_PROVIDER_SITE_OTHER): Payer: Self-pay | Admitting: Family Medicine

## 2022-06-10 ENCOUNTER — Ambulatory Visit (INDEPENDENT_AMBULATORY_CARE_PROVIDER_SITE_OTHER): Payer: PPO | Admitting: Family Medicine

## 2022-06-10 VITALS — BP 123/80 | HR 73 | Temp 97.8°F | Ht 60.0 in | Wt 229.0 lb

## 2022-06-10 DIAGNOSIS — E669 Obesity, unspecified: Secondary | ICD-10-CM

## 2022-06-10 DIAGNOSIS — E559 Vitamin D deficiency, unspecified: Secondary | ICD-10-CM | POA: Insufficient documentation

## 2022-06-10 DIAGNOSIS — R7303 Prediabetes: Secondary | ICD-10-CM | POA: Diagnosis not present

## 2022-06-10 DIAGNOSIS — R0602 Shortness of breath: Secondary | ICD-10-CM

## 2022-06-10 DIAGNOSIS — E7849 Other hyperlipidemia: Secondary | ICD-10-CM

## 2022-06-10 DIAGNOSIS — R5383 Other fatigue: Secondary | ICD-10-CM

## 2022-06-10 DIAGNOSIS — E785 Hyperlipidemia, unspecified: Secondary | ICD-10-CM

## 2022-06-10 DIAGNOSIS — Z6841 Body Mass Index (BMI) 40.0 and over, adult: Secondary | ICD-10-CM | POA: Insufficient documentation

## 2022-06-10 DIAGNOSIS — E66813 Obesity, class 3: Secondary | ICD-10-CM

## 2022-06-10 DIAGNOSIS — Z1331 Encounter for screening for depression: Secondary | ICD-10-CM | POA: Diagnosis not present

## 2022-06-12 LAB — LIPID PANEL WITH LDL/HDL RATIO
Cholesterol, Total: 145 mg/dL (ref 100–199)
HDL: 36 mg/dL — ABNORMAL LOW (ref >39)
LDL Chol Calc (NIH): 89 mg/dL (ref 0–99)
LDL/HDL Ratio: 2.5 ratio (ref 0.0–3.6)
Triglycerides: 110 mg/dL (ref 0–149)
VLDL Cholesterol Cal: 20 mg/dL (ref 5–40)

## 2022-06-12 LAB — T4, FREE: Free T4: 1.12 ng/dL (ref 0.82–1.77)

## 2022-06-12 LAB — CMP14+EGFR
ALT: 29 IU/L (ref 0–44)
AST: 17 IU/L (ref 0–40)
Albumin/Globulin Ratio: 2.1 (ref 1.2–2.2)
Albumin: 4.6 g/dL (ref 3.9–4.9)
Alkaline Phosphatase: 66 IU/L (ref 44–121)
BUN/Creatinine Ratio: 15 (ref 10–24)
BUN: 14 mg/dL (ref 8–27)
Bilirubin Total: 0.3 mg/dL (ref 0.0–1.2)
CO2: 20 mmol/L (ref 20–29)
Calcium: 9 mg/dL (ref 8.6–10.2)
Chloride: 104 mmol/L (ref 96–106)
Creatinine, Ser: 0.95 mg/dL (ref 0.76–1.27)
Globulin, Total: 2.2 g/dL (ref 1.5–4.5)
Glucose: 92 mg/dL (ref 70–99)
Potassium: 4.5 mmol/L (ref 3.5–5.2)
Sodium: 144 mmol/L (ref 134–144)
Total Protein: 6.8 g/dL (ref 6.0–8.5)
eGFR: 87 mL/min/{1.73_m2} (ref >59)

## 2022-06-12 LAB — CBC WITH DIFFERENTIAL/PLATELET
Basophils Absolute: 0.1 10*3/uL (ref 0.0–0.2)
Basos: 1 %
EOS (ABSOLUTE): 0.3 10*3/uL (ref 0.0–0.4)
Eos: 5 %
Hematocrit: 42.6 % (ref 37.5–51.0)
Hemoglobin: 14.1 g/dL (ref 13.0–17.7)
Immature Grans (Abs): 0.1 10*3/uL (ref 0.0–0.1)
Immature Granulocytes: 1 %
Lymphocytes Absolute: 1.5 10*3/uL (ref 0.7–3.1)
Lymphs: 21 %
MCH: 30 pg (ref 26.6–33.0)
MCHC: 33.1 g/dL (ref 31.5–35.7)
MCV: 91 fL (ref 79–97)
Monocytes Absolute: 0.6 10*3/uL (ref 0.1–0.9)
Monocytes: 8 %
Neutrophils Absolute: 4.5 10*3/uL (ref 1.4–7.0)
Neutrophils: 64 %
Platelets: 338 10*3/uL (ref 150–450)
RBC: 4.7 x10E6/uL (ref 4.14–5.80)
RDW: 12.9 % (ref 11.6–15.4)
WBC: 7.1 10*3/uL (ref 3.4–10.8)

## 2022-06-12 LAB — FOLATE: Folate: 4.5 ng/mL (ref >3.0)

## 2022-06-12 LAB — VITAMIN B12: Vitamin B-12: 313 pg/mL (ref 232–1245)

## 2022-06-12 LAB — HEMOGLOBIN A1C
Est. average glucose Bld gHb Est-mCnc: 131 mg/dL
Hgb A1c MFr Bld: 6.2 % — ABNORMAL HIGH (ref 4.8–5.6)

## 2022-06-12 LAB — TSH: TSH: 2.42 u[IU]/mL (ref 0.450–4.500)

## 2022-06-12 LAB — VITAMIN D 25 HYDROXY (VIT D DEFICIENCY, FRACTURES): Vit D, 25-Hydroxy: 35.8 ng/mL (ref 30.0–100.0)

## 2022-06-12 LAB — T3: T3, Total: 142 ng/dL (ref 71–180)

## 2022-06-12 LAB — INSULIN, RANDOM: INSULIN: 34.2 u[IU]/mL — ABNORMAL HIGH (ref 2.6–24.9)

## 2022-06-14 NOTE — Progress Notes (Unsigned)
    Chief Complaint:   OBESITY Preston Thomas (MR# [id number]) is a 70 y.o. male who presents for evaluation and treatment of obesity and related comorbidities. Current BMI is Body mass index is 44.72 kg/m. Preston Thomas has been struggling with his weight for many years and has been unsuccessful in either losing weight, maintaining weight loss, or reaching his healthy weight goal.  Preston Thomas is currently in the action stage of change and ready to dedicate time achieving and maintaining a healthier weight. Preston Thomas is interested in becoming our patient and working on intensive lifestyle modifications including (but not limited to) diet and exercise for weight loss.  Preston Thomas's habits were reviewed today and are as follows: His family eats meals together, his desired weight loss is 69 lbs, he has been heavy most of his life, he started gaining weight in his 60's, his heaviest weight ever was 230 pounds, he has significant food cravings issues, he snacks frequently in the evenings, he skips meals frequently, and he is frequently drinking liquids with calories.  Depression Screen Preston Thomas's Food and Mood (modified PHQ-9) score was 4.     06/10/2022   10:38 AM  Depression screen PHQ 2/9  Decreased Interest 1  Down, Depressed, Hopeless 0  PHQ - 2 Score 1  Altered sleeping 0  Tired, decreased energy 2  Change in appetite 0  Feeling bad or failure about yourself  0  Trouble concentrating 0  Moving slowly or fidgety/restless 1  Suicidal thoughts 0  PHQ-9 Score 4  Difficult doing work/chores Not difficult at all   Subjective:   1. Fatigue, unspecified type Preston Thomas admits to daytime somnolence and admits to waking up still tired. Patient has a history of symptoms of daytime fatigue and morning fatigue. Preston Thomas generally gets 6 or 7 hours of sleep per night, and states that he has generally restful sleep. Snoring is present. Apneic episodes are present. Epworth Sleepiness Score is 3.   2.  SOBOE (shortness of breath on exertion) Preston Thomas notes increasing shortness of breath with exercising and seems to be worsening over time with weight gain. He notes getting out of breath sooner with activity than he used to. This has not gotten worse recently. Preston Thomas denies shortness of breath at rest or orthopnea.  3. Other hyperlipidemia Preston Thomas is not on statin, and he is ready to work on his diet.  4. Vitamin D deficiency Preston Thomas is on not on vitamin D, and he is highly likely to be vitamin D deficient.  5. Prediabetes Preston Thomas has a history of elevated glucose readings, and he is ready to work on his diet and lifestyle modifications.  Assessment/Plan:   1. Fatigue, unspecified type Preston Thomas does feel that his weight is causing his energy to be lower than it should be. Fatigue may be related to obesity, depression or many other causes. Labs will be ordered, and in the meanwhile, Preston Thomas will focus on self care including making healthy food choices, increasing physical activity and focusing on stress reduction.  - Vitamin B12 - CBC with Differential/Platelet - Folate - TSH - T4, free - T3  2. SOBOE (shortness of breath on exertion) IC shows a VO2 close to goal, but about 50 cal lower than expected.  Preston Thomas does feel that he gets out of breath more easily that he used to when he exercises. Preston Thomas's shortness of breath appears to be obesity related and exercise induced. He has agreed to work on weight loss and gradually increase exercise to treat   his exercise induced shortness of breath. Will continue to monitor closely.  3. Other hyperlipidemia We will check labs today. Preston Thomas will start his Category 3 plan, exercise, and weight loss efforts. Orders and follow up as documented in patient record.   - Lipid Panel With LDL/HDL Ratio  4. Vitamin D deficiency We will check labs today. Low Vitamin D level contributes to fatigue and are associated with obesity, breast, and colon cancer.  Preston Thomas will follow-up for routine testing of Vitamin D, at least 2-3 times per year to avoid over-replacement.  - VITAMIN D 25 Hydroxy (Vit-D Deficiency, Fractures)  5. Prediabetes We will check labs today. Preston Thomas will start his Category 3 plan, exercise, and decreasing simple carbohydrates to help decrease the risk of diabetes.   - CMP14+EGFR - Insulin, random - Hemoglobin A1c  6. Depression screening Preston Thomas had a negative depression screening. Depression is commonly associated with obesity and often results in emotional eating behaviors. We will monitor this closely and work on CBT to help improve the non-hunger eating patterns.   7. Obesity with current BMI of 44.8 Preston Thomas is currently in the action stage of change and his goal is to continue with weight loss efforts. I recommend Preston Thomas begin the structured treatment plan as follows:  He has agreed to the Category 3 Plan.  Exercise goals: No exercise has been prescribed for now, while we concentrate on nutritional changes.   Behavioral modification strategies: no skipping meals and meal planning and cooking strategies.  He was informed of the importance of frequent follow-up visits to maximize his success with intensive lifestyle modifications for his multiple health conditions. He was informed we would discuss his lab results at his next visit unless there is a critical issue that needs to be addressed sooner. Preston Thomas agreed to keep his next visit at the agreed upon time to discuss these results.  Objective:   Blood pressure 123/80, pulse 73, temperature 97.8 F (36.6 C), height 5' (1.524 m), weight 229 lb (103.9 kg), SpO2 97 %. Body mass index is 44.72 kg/m.  EKG: Normal sinus rhythm, rate 78 BPM.  Indirect Calorimeter completed today shows a VO2 of 260 and a REE of 1800.  His calculated basal metabolic rate is 9169 thus his basal metabolic rate is worse than expected.  General: Cooperative, alert, well developed, in no  acute distress. HEENT: Conjunctivae and lids unremarkable. Cardiovascular: Regular rhythm.  Lungs: Normal work of breathing. Neurologic: No focal deficits.   Lab Results  Component Value Date   CREATININE 0.95 06/10/2022   BUN 14 06/10/2022   NA 144 06/10/2022   K 4.5 06/10/2022   CL 104 06/10/2022   CO2 20 06/10/2022   Lab Results  Component Value Date   ALT 29 06/10/2022   AST 17 06/10/2022   ALKPHOS 66 06/10/2022   BILITOT 0.3 06/10/2022   Lab Results  Component Value Date   HGBA1C 6.2 (H) 06/10/2022   HGBA1C 5.8 (A) 12/19/2021   HGBA1C 6.1 (A) 03/17/2021   HGBA1C 6.1 09/02/2020   Lab Results  Component Value Date   INSULIN 34.2 (H) 06/10/2022   Lab Results  Component Value Date   TSH 2.420 06/10/2022   Lab Results  Component Value Date   CHOL 145 06/10/2022   HDL 36 (L) 06/10/2022   LDLCALC 89 06/10/2022   LDLDIRECT 167.0 05/15/2019   TRIG 110 06/10/2022   CHOLHDL 4 12/19/2021   Lab Results  Component Value Date   WBC 7.1 06/10/2022  HGB 14.1 06/10/2022   HCT 42.6 06/10/2022   MCV 91 06/10/2022   PLT 338 06/10/2022   Lab Results  Component Value Date   FERRITIN 986 (H) 04/26/2019   Attestation Statements:   Reviewed by clinician on day of visit: allergies, medications, problem list, medical history, surgical history, family history, social history, and previous encounter notes.  Time spent on visit including pre-visit chart review and post-visit charting and care was 42 minutes.   I, Trixie Dredge, am acting as transcriptionist for Dennard Nip, MD.  I have reviewed the above documentation for accuracy and completeness, and I agree with the above. - Dennard Nip, MD

## 2022-06-18 NOTE — Addendum Note (Signed)
Addended by: Dennard Nip D on: 06/18/2022 02:24 PM   Modules accepted: Orders, Level of Service

## 2022-06-23 ENCOUNTER — Ambulatory Visit: Payer: PPO

## 2022-06-23 ENCOUNTER — Other Ambulatory Visit: Payer: Self-pay | Admitting: Urology

## 2022-06-23 ENCOUNTER — Other Ambulatory Visit: Payer: Self-pay | Admitting: Primary Care

## 2022-06-23 DIAGNOSIS — R9431 Abnormal electrocardiogram [ECG] [EKG]: Secondary | ICD-10-CM | POA: Diagnosis not present

## 2022-06-23 DIAGNOSIS — R42 Dizziness and giddiness: Secondary | ICD-10-CM | POA: Diagnosis not present

## 2022-06-23 DIAGNOSIS — E785 Hyperlipidemia, unspecified: Secondary | ICD-10-CM

## 2022-06-23 LAB — ECHOCARDIOGRAM COMPLETE
AR max vel: 1.83 cm2
AV Area VTI: 1.78 cm2
AV Area mean vel: 1.65 cm2
AV Mean grad: 6 mmHg
AV Peak grad: 8.5 mmHg
Ao pk vel: 1.46 m/s
Area-P 1/2: 4.46 cm2
S' Lateral: 3.1 cm

## 2022-06-23 MED ORDER — PERFLUTREN LIPID MICROSPHERE
1.0000 mL | INTRAVENOUS | Status: AC | PRN
  Administered 2022-06-23: 2 mL via INTRAVENOUS

## 2022-06-24 ENCOUNTER — Ambulatory Visit (INDEPENDENT_AMBULATORY_CARE_PROVIDER_SITE_OTHER): Payer: PPO | Admitting: Family Medicine

## 2022-06-24 ENCOUNTER — Encounter (INDEPENDENT_AMBULATORY_CARE_PROVIDER_SITE_OTHER): Payer: Self-pay | Admitting: Family Medicine

## 2022-06-24 VITALS — BP 119/74 | HR 74 | Temp 98.0°F | Ht 60.0 in | Wt 228.0 lb

## 2022-06-24 DIAGNOSIS — E669 Obesity, unspecified: Secondary | ICD-10-CM | POA: Diagnosis not present

## 2022-06-24 DIAGNOSIS — E66813 Obesity, class 3: Secondary | ICD-10-CM

## 2022-06-24 DIAGNOSIS — E559 Vitamin D deficiency, unspecified: Secondary | ICD-10-CM

## 2022-06-24 DIAGNOSIS — Z6841 Body Mass Index (BMI) 40.0 and over, adult: Secondary | ICD-10-CM | POA: Diagnosis not present

## 2022-06-24 DIAGNOSIS — E538 Deficiency of other specified B group vitamins: Secondary | ICD-10-CM | POA: Diagnosis not present

## 2022-06-24 DIAGNOSIS — R7303 Prediabetes: Secondary | ICD-10-CM | POA: Diagnosis not present

## 2022-06-24 DIAGNOSIS — E7849 Other hyperlipidemia: Secondary | ICD-10-CM | POA: Diagnosis not present

## 2022-06-24 DIAGNOSIS — R7989 Other specified abnormal findings of blood chemistry: Secondary | ICD-10-CM

## 2022-06-24 MED ORDER — METFORMIN HCL 500 MG PO TABS: 500.0000 mg | ORAL_TABLET | Freq: Every day | ORAL | 0 refills | Status: DC

## 2022-06-24 MED ORDER — VITAMIN D (ERGOCALCIFEROL) 1.25 MG (50000 UNIT) PO CAPS: 50000.0000 [IU] | ORAL_CAPSULE | ORAL | 0 refills | Status: DC

## 2022-06-24 NOTE — Progress Notes (Deleted)
Cardiology Clinic Note   Patient Name: Preston Thomas Date of Encounter: 06/24/2022  Primary Care Provider:  Pleas [name], NP Primary Cardiologist:  None  Patient Profile    71 year old male with a past medical history of bilateral lower extremity edema, chronic dyspnea, prostate cancer, gastroesophageal reflux disease, sleep apnea, and morbid obesity, who is here today to follow-up on recent concerns of syncope and abnormal EKG.  Past Medical History    Past Medical History:  Diagnosis Date   Bilateral lower extremity edema    Cancer ([place])    COLONIC POLYPS, HX OF 08/31/2007   Qualifier: Diagnosis of  By: Silvio Pate MD, [facility]    DIVERTICULITIS, HX OF 08/31/2007   Qualifier: Diagnosis of  By: Joesph Fillers CMA (AAMA), Natasha     DIVERTICULOSIS, COLON 08/31/2007   Qualifier: Diagnosis of  By: Silvio Pate MD, [facility]    Dyspnea    Related to Covid   Gallbladder problem    GERD (gastroesophageal reflux disease)    History of kidney stones    Pneumonia due to COVID-19 virus 04/19/2019   RENAL CALCULUS, HX OF 08/31/2007   Qualifier: Diagnosis of  By: Joesph Fillers CMA (AAMA), Natasha     Sleep apnea    Swallowing difficulty    Past Surgical History:  Procedure Laterality Date   [place]   RADIOACTIVE SEED IMPLANT N/A 06/27/2019   Procedure: RADIOACTIVE SEED IMPLANT/BRACHYTHERAPY IMPLANT;  Surgeon: Abbie Sons, MD;  Location: [facility] ORS;  Service: Urology;  Laterality: N/A;    Allergies  Allergies  Allergen Reactions   Penicillins     Passed out Did it involve swelling of the face/tongue/throat, SOB, or low BP? Yes Did it involve sudden or severe rash/hives, skin peeling, or any reaction on the inside of your mouth or nose? No Did you need to seek medical attention at a hospital or doctor's office? No When did it last happen?      30 + years If all above answers are "NO", may proceed with cephalosporin use.     History  of Present Illness    Preston Thomas is a 71 year old male with a history of bilateral lower extremity edema, chronic dyspnea on exertion, prostate cancer, gastroesophageal reflux disease, sleep apnea, and morbid obesity.  He had been vacationing in [place] earlier in the year playing golf with a heat index of over 100.  He started feeling dizzy, heart rate was noted to be elevated over 100, and felt like he was going to pass out.  He was given fluids and followed up with his PCP on return.  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  This was unchanged from prior EKG in 2020.    He was last seen by Dr.Agbor-Etang  on 05/19/2022 during his visit he denied any further episode of dizziness, chest pain, or worsening shortness of breath.  At that time he was scheduled for an echocardiogram.  Echocardiogram revealed LVEF 55-60%, no wall motion abnormalities, G1 DD, and no valvular abnormalities.  He returns to clinic today    Home Medications    Current Outpatient Medications  Medication Sig Dispense Refill   atorvastatin (LIPITOR) 20 MG tablet Take 1 tablet (20 mg total) by mouth daily. for cholesterol. 90 tablet 3   clobetasol ointment (TEMOVATE) 3.15 % Apply 1 application topically 2 (two) times daily. 30 g 0   fluocinonide (LIDEX) 0.05 % external solution Apply  1 application topically 2 (two) times daily. 60 mL 0   fluticasone (CUTIVATE) 0.05 % cream Apply topically 2 (two) times daily. 30 g 0   omeprazole (PRILOSEC) 20 MG capsule Take 20 mg by mouth daily.     SUMAtriptan (IMITREX) 100 MG tablet TAKE 1 TABLET BY MOUTH AT MIGRAINE ONSET,MAY REPEAT IN 2 HOURS IF HEADACHE PERSISTS/RECURS *DO NOT EXCEED '100MG'$  IN 24 HOURS** 10 tablet 0   tamsulosin (FLOMAX) 0.4 MG CAPS capsule TAKE 1 CAPSULE BY MOUTH ONCE DAILY 90 capsule 2   triamcinolone (KENALOG) 0.025 % cream Apply 1 application topically 2 (two) times daily.     vitamin B-12 (CYANOCOBALAMIN) 500 MCG tablet Take 500 mcg by  mouth daily.     vitamin C (ASCORBIC ACID) 250 MG tablet Take 250 mg by mouth daily.     Zinc 100 MG TABS Take by mouth.     No current facility-administered medications for this visit.     Family History    Family History  Problem Relation Age of Onset   Early death Father    Kidney disease Father    Heart failure Sister    He indicated that his mother is deceased. He indicated that his father is deceased. He indicated that his sister is deceased.  Social History    Social History   Socioeconomic History   Marital status: Married    Spouse name: Not on file   Number of children: Not on file   Years of education: Not on file   Highest education level: Not on file  Occupational History   Not on file  Tobacco Use   Smoking status: Former    Packs/day: 1.50    Years: 10.00    Total pack years: 15.00    Types: Cigarettes    Quit date: 09/28/1984    Years since quitting: 37.7   Smokeless tobacco: Never  Vaping Use   Vaping Use: Never used  Substance and Sexual Activity   Alcohol use: Not Currently   Drug use: Never   Sexual activity: Yes  Other Topics Concern   Not on file  Social History Narrative   Not on file   Social Determinants of Health   Financial Resource Strain: Low Risk  (09/12/2021)   Overall Financial Resource Strain (CARDIA)    Difficulty of Paying Living Expenses: Not hard at all  Food Insecurity: No Food Insecurity (09/12/2021)   Hunger Vital Sign    Worried About [place] in the Last Year: Never true    [place] in the Last Year: Never true  Transportation Needs: No Transportation Needs (09/12/2021)   PRAPARE - Hydrologist (Medical): No    Lack of Transportation (Non-Medical): No  Physical Activity: Sufficiently Active (09/12/2021)   Exercise Vital Sign    Days of Exercise per Week: 4 days    Minutes of Exercise per Session: 50 min  Stress: No Stress Concern Present (09/12/2021)   [place]    Feeling of Stress : Not at all  Social Connections: [place] (09/12/2021)   Social Connection and Isolation Panel [NHANES]    Frequency of Communication with Friends and Family: More than three times a week    Frequency of Social Gatherings with Friends and Family: More than three times a week    Attends Religious Services: More than 4 times per year    Active Member of  Clubs or Organizations: Yes    Attends Archivist Meetings: More than 4 times per year    Marital Status: Married  Human resources officer Violence: Not At Risk (09/12/2021)   Humiliation, Afraid, Rape, and Kick questionnaire    Fear of Current or Ex-Partner: No    Emotionally Abused: No    Physically Abused: No    Sexually Abused: No     Review of Systems    General:  No chills, fever, night sweats or weight changes.  Cardiovascular:  No chest pain, dyspnea on exertion, edema, orthopnea, palpitations, paroxysmal nocturnal dyspnea. Dermatological: No rash, lesions/masses Respiratory: No cough, dyspnea Urologic: No hematuria, dysuria Abdominal:   No nausea, vomiting, diarrhea, bright red blood per rectum, melena, or hematemesis Neurologic:  No visual changes, wkns, changes in mental status. All other systems reviewed and are otherwise negative except as noted above.     Physical Exam    VS:  There were no vitals taken for this visit. , BMI There is no height or weight on file to calculate BMI.     GEN: Well nourished, well developed, in no acute distress. HEENT: normal. Neck: Supple, no JVD, carotid bruits, or masses. Cardiac: RRR, no murmurs, rubs, or gallops. No clubbing, cyanosis, edema.  Radials/DP/PT 2+ and equal bilaterally.  Respiratory:  Respirations regular and unlabored, clear to auscultation bilaterally. GI: Soft, nontender, nondistended, BS + x 4. MS: no deformity or atrophy. Skin: warm and dry, no rash. Neuro:   Strength and sensation are intact. Psych: Normal affect.  Accessory Clinical Findings    ECG personally reviewed by me today- *** - No acute changes  Lab Results  Component Value Date   WBC 7.1 06/10/2022   HGB 14.1 06/10/2022   HCT 42.6 06/10/2022   MCV 91 06/10/2022   PLT 338 06/10/2022   Lab Results  Component Value Date   CREATININE 0.95 06/10/2022   BUN 14 06/10/2022   NA 144 06/10/2022   K 4.5 06/10/2022   CL 104 06/10/2022   CO2 20 06/10/2022   Lab Results  Component Value Date   ALT 29 06/10/2022   AST 17 06/10/2022   ALKPHOS 66 06/10/2022   BILITOT 0.3 06/10/2022   Lab Results  Component Value Date   CHOL 145 06/10/2022   HDL 36 (L) 06/10/2022   LDLCALC 89 06/10/2022   LDLDIRECT 167.0 05/15/2019   TRIG 110 06/10/2022   CHOLHDL 4 12/19/2021    Lab Results  Component Value Date   HGBA1C 6.2 (H) 06/10/2022    Assessment & Plan   1.  ***  Sherrick Araki, NP 06/24/2022, 3:46 PM

## 2022-06-26 ENCOUNTER — Encounter: Payer: Self-pay | Admitting: Urology

## 2022-06-26 ENCOUNTER — Ambulatory Visit: Payer: PPO | Admitting: Cardiology

## 2022-06-29 NOTE — Progress Notes (Signed)
Chief Complaint:   OBESITY Preston Thomas is here to discuss his progress with his obesity treatment plan along with follow-up of his obesity related diagnoses. Preston Thomas is on the Category 3 Plan and states he is following his eating plan approximately 100% of the time. Preston Thomas states he is doing 0 minutes 0 times per week.  Today's visit was #: 2 Starting weight: 229 lbs Starting date: 06/10/2022 Today's weight: 228 lbs Today's date: 06/24/2022 Total lbs lost to date: 1 Total lbs lost since last in-office visit: 1  Interim History: Preston Thomas had difficulty getting in all the food on the plan. He did well with his protein, and he lost 2 lbs in fat, but slightly up 1 lb in water. He feels like his feet and hands are retaining some fluid.   Subjective:   1. Prediabetes Preston Thomas has a new diagnosis. His A1c is 6.2 and insulin level is 34.2. I discussed the mechanism of blood sugar control and fat storage related to insulin. I discussed labs with the patient today.   2. Other hyperlipidemia Preston Thomas is taking statin with no side effects noted. I discussed labs with the patient today.   3. Vitamin D deficiency Preston Thomas has a new diagnosis. His Vitamin D level is 35.8, low. He notes fatigue. I discussed labs with the patient today.   4. Low vitamin B12 level Preston Thomas has a new diagnosis. Likely due to absorption problems. His level is low and he notes fatigue. He is not a vegetarian and he has no history of pernicious anemia. I discussed labs with the patient today.   Assessment/Plan:   1. Prediabetes Preston Thomas agreed to start metformin 500 mg once daily with no refills. He will continue with his diet and we will recheck labs in 3 months.   - metFORMIN (GLUCOPHAGE) 500 MG tablet; Take 1 tablet (500 mg total) by mouth daily.  Dispense: 30 tablet; Refill: 0  2. Other hyperlipidemia Preston Thomas will continue statin, and continue with his diet and we will recheck labs in 3 months.   3. Vitamin D  deficiency Preston Thomas agreed to start prescription Vitamin D 50,000 IU every week with no refills. He will continue his diet and we will recheck labs in 3 months.   - Vitamin D, Ergocalciferol, (DRISDOL) 1.25 MG (50000 UNIT) CAPS capsule; Take 1 capsule (50,000 Units total) by mouth every 7 (seven) days.  Dispense: 4 capsule; Refill: 0  4. Low vitamin B12 level Preston Thomas will start OTC B12 1,000 mcg daily, and he will continue with his diet and we will recheck labs in 3 months.   5. Obesity, Current BMI 44.6 Preston Thomas is currently in the action stage of change. As such, his goal is to continue with weight loss efforts. He has agreed to the Category 3 Plan.   Preston Thomas will continue with his diet, and we will follow up on his labs in 3 months.   Exercise goals: No exercise has been prescribed at this time.  Behavioral modification strategies: increasing lean protein intake, decreasing simple carbohydrates, and no skipping meals.  Preston Thomas has agreed to follow-up with our clinic in 2 weeks. He was informed of the importance of frequent follow-up visits to maximize his success with intensive lifestyle modifications for his multiple health conditions.   Objective:   Blood pressure 119/74, pulse 74, temperature 98 F (36.7 C), height 5' (1.524 m), weight 228 lb (103.4 kg), SpO2 99 %. Body mass index is 44.53 kg/m.  General: Cooperative, alert, well developed,  in no acute distress. HEENT: Conjunctivae and lids unremarkable. Cardiovascular: Regular rhythm.  Lungs: Normal work of breathing. Neurologic: No focal deficits.   Lab Results  Component Value Date   CREATININE 0.95 06/10/2022   BUN 14 06/10/2022   NA 144 06/10/2022   K 4.5 06/10/2022   CL 104 06/10/2022   CO2 20 06/10/2022   Lab Results  Component Value Date   ALT 29 06/10/2022   AST 17 06/10/2022   ALKPHOS 66 06/10/2022   BILITOT 0.3 06/10/2022   Lab Results  Component Value Date   HGBA1C 6.2 (H) 06/10/2022   HGBA1C 5.8 (A)  12/19/2021   HGBA1C 6.1 (A) 03/17/2021   HGBA1C 6.1 09/02/2020   Lab Results  Component Value Date   INSULIN 34.2 (H) 06/10/2022   Lab Results  Component Value Date   TSH 2.420 06/10/2022   Lab Results  Component Value Date   CHOL 145 06/10/2022   HDL 36 (L) 06/10/2022   LDLCALC 89 06/10/2022   LDLDIRECT 167.0 05/15/2019   TRIG 110 06/10/2022   CHOLHDL 4 12/19/2021   Lab Results  Component Value Date   VD25OH 35.8 06/10/2022   Lab Results  Component Value Date   WBC 7.1 06/10/2022   HGB 14.1 06/10/2022   HCT 42.6 06/10/2022   MCV 91 06/10/2022   PLT 338 06/10/2022   Lab Results  Component Value Date   FERRITIN 986 (H) 04/26/2019   Attestation Statements:   Reviewed by clinician on day of visit: allergies, medications, problem list, medical history, surgical history, family history, social history, and previous encounter notes.  Time spent on visit including pre-visit chart review and post-visit care and charting was 40 minutes.   I, Trixie Dredge, am acting as transcriptionist for Dennard Nip, MD.  I have reviewed the above documentation for accuracy and completeness, and I agree with the above. -  Dennard Nip, MD

## 2022-06-30 ENCOUNTER — Ambulatory Visit: Payer: PPO | Admitting: Cardiology

## 2022-07-07 ENCOUNTER — Encounter: Payer: Self-pay | Admitting: Physical Therapy

## 2022-07-07 ENCOUNTER — Ambulatory Visit: Payer: PPO | Attending: Family Medicine | Admitting: Physical Therapy

## 2022-07-07 DIAGNOSIS — M542 Cervicalgia: Secondary | ICD-10-CM | POA: Insufficient documentation
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
## 2022-07-09 ENCOUNTER — Ambulatory Visit (INDEPENDENT_AMBULATORY_CARE_PROVIDER_SITE_OTHER): Payer: PPO | Admitting: Family Medicine

## 2022-07-09 ENCOUNTER — Encounter (INDEPENDENT_AMBULATORY_CARE_PROVIDER_SITE_OTHER): Payer: Self-pay | Admitting: Family Medicine

## 2022-07-09 VITALS — BP 115/69 | HR 79 | Temp 97.8°F | Ht 60.0 in | Wt 227.0 lb

## 2022-07-09 DIAGNOSIS — E559 Vitamin D deficiency, unspecified: Secondary | ICD-10-CM | POA: Diagnosis not present

## 2022-07-09 DIAGNOSIS — E669 Obesity, unspecified: Secondary | ICD-10-CM

## 2022-07-09 DIAGNOSIS — Z6841 Body Mass Index (BMI) 40.0 and over, adult: Secondary | ICD-10-CM | POA: Diagnosis not present

## 2022-07-09 DIAGNOSIS — R7303 Prediabetes: Secondary | ICD-10-CM

## 2022-07-09 MED ORDER — METFORMIN HCL 500 MG PO TABS: 500.0000 mg | ORAL_TABLET | Freq: Every day | ORAL | 0 refills | Status: DC

## 2022-07-09 MED ORDER — VITAMIN D (ERGOCALCIFEROL) 1.25 MG (50000 UNIT) PO CAPS: 50000.0000 [IU] | ORAL_CAPSULE | ORAL | 0 refills | Status: DC

## 2022-07-14 ENCOUNTER — Encounter: Payer: Self-pay | Admitting: Physical Therapy

## 2022-07-14 ENCOUNTER — Ambulatory Visit: Payer: PPO | Admitting: Physical Therapy

## 2022-07-14 DIAGNOSIS — M542 Cervicalgia: Secondary | ICD-10-CM

## 2022-07-14 NOTE — Progress Notes (Signed)
Chief Complaint:   OBESITY Preston Thomas is here to discuss his progress with his obesity treatment plan along with follow-up of his obesity related diagnoses. Preston Thomas is on the Category 3 Plan and states he is following his eating plan approximately 98% of the time. Preston Thomas states he is walking for 30 minutes 2-3 times per week.  Today's visit was #: 3 Starting weight: 229 lbs Starting date: 06/10/2022 Today's weight: 227 lbs Today's date: 07/09/2022 Total lbs lost to date: 2 Total lbs lost since last in-office visit: 1  Interim History: Preston Thomas continues to do well with weight loss. He notes his hunger is controlled but often struggles to eat all of the food on his plan. He denies sweet cravings.   Subjective:   1. Prediabetes Preston Thomas started metformin with no GI upset. He is doing well with his diet.   2. Vitamin D deficiency Preston Thomas started Vitamin D with no side effects noted. His level is not yet at goal.   Assessment/Plan:   1. Prediabetes We will refill metformin for 1 month. Preston Thomas will continue to work on his diet, exercise, and decreasing simple carbohydrates to help decrease the risk of diabetes.   - metFORMIN (GLUCOPHAGE) 500 MG tablet; Take 1 tablet (500 mg total) by mouth daily.  Dispense: 30 tablet; Refill: 0  2. Vitamin D deficiency We will refill prescription Vitamin D for 1 month. Preston Thomas will follow-up for routine testing of Vitamin D, at least 2-3 times per year to avoid over-replacement.  - Vitamin D, Ergocalciferol, (DRISDOL) 1.25 MG (50000 UNIT) CAPS capsule; Take 1 capsule (50,000 Units total) by mouth every 7 (seven) days.  Dispense: 4 capsule; Refill: 0  3. Obesity, Current BMI 44.3 Preston Thomas is currently in the action stage of change. As such, his goal is to continue with weight loss efforts. He has agreed to change to the Category 2 Plan.   Exercise goals: As is.   Behavioral modification strategies: no skipping meals.  Preston Thomas has agreed to  follow-up with our clinic in 2 weeks. He was informed of the importance of frequent follow-up visits to maximize his success with intensive lifestyle modifications for his multiple health conditions.   Objective:   Blood pressure 115/69, pulse 79, temperature 97.8 F (36.6 C), height 5' (1.524 m), weight 227 lb (103 kg), SpO2 94 %. Body mass index is 44.33 kg/m.  General: Cooperative, alert, well developed, in no acute distress. HEENT: Conjunctivae and lids unremarkable. Cardiovascular: Regular rhythm.  Lungs: Normal work of breathing. Neurologic: No focal deficits.   Lab Results  Component Value Date   CREATININE 0.95 06/10/2022   BUN 14 06/10/2022   NA 144 06/10/2022   K 4.5 06/10/2022   CL 104 06/10/2022   CO2 20 06/10/2022   Lab Results  Component Value Date   ALT 29 06/10/2022   AST 17 06/10/2022   ALKPHOS 66 06/10/2022   BILITOT 0.3 06/10/2022   Lab Results  Component Value Date   HGBA1C 6.2 (H) 06/10/2022   HGBA1C 5.8 (A) 12/19/2021   HGBA1C 6.1 (A) 03/17/2021   HGBA1C 6.1 09/02/2020   Lab Results  Component Value Date   INSULIN 34.2 (H) 06/10/2022   Lab Results  Component Value Date   TSH 2.420 06/10/2022   Lab Results  Component Value Date   CHOL 145 06/10/2022   HDL 36 (L) 06/10/2022   LDLCALC 89 06/10/2022   LDLDIRECT 167.0 05/15/2019   TRIG 110 06/10/2022   CHOLHDL 4 12/19/2021  Lab Results  Component Value Date   VD25OH 35.8 06/10/2022   Lab Results  Component Value Date   WBC 7.1 06/10/2022   HGB 14.1 06/10/2022   HCT 42.6 06/10/2022   MCV 91 06/10/2022   PLT 338 06/10/2022   Lab Results  Component Value Date   FERRITIN 986 (H) 04/26/2019   Attestation Statements:   Reviewed by clinician on day of visit: allergies, medications, problem list, medical history, surgical history, family history, social history, and previous encounter notes.   I, Trixie Dredge, am acting as transcriptionist for Dennard Nip, MD.  I have reviewed  the above documentation for accuracy and completeness, and I agree with the above. -  Dennard Nip, MD
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
## 2022-07-16 ENCOUNTER — Telehealth: Payer: PPO | Admitting: Internal Medicine

## 2022-07-16 ENCOUNTER — Ambulatory Visit: Payer: PPO | Admitting: Physical Therapy

## 2022-07-16 ENCOUNTER — Encounter: Payer: Self-pay | Admitting: Internal Medicine

## 2022-07-16 ENCOUNTER — Encounter: Payer: Self-pay | Admitting: Physical Therapy

## 2022-07-16 DIAGNOSIS — M542 Cervicalgia: Secondary | ICD-10-CM | POA: Diagnosis not present
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
## 2022-07-16 NOTE — Progress Notes (Signed)
   Subjective:    Patient ID: Preston Thomas, male    DOB: 1951/02/28, 71 y.o.   MRN: [id number]  HPI      Review of Systems     Objective:   Physical Exam         Assessment & Plan:

## 2022-07-17 DIAGNOSIS — H579 Unspecified disorder of eye and adnexa: Secondary | ICD-10-CM | POA: Insufficient documentation

## 2022-07-17 NOTE — Assessment & Plan Note (Signed)
No show for visit      
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
## 2022-07-20 ENCOUNTER — Encounter: Payer: Self-pay | Admitting: Cardiology

## 2022-07-20 ENCOUNTER — Ambulatory Visit: Payer: PPO | Attending: Cardiology | Admitting: Cardiology

## 2022-07-20 DIAGNOSIS — E785 Hyperlipidemia, unspecified: Secondary | ICD-10-CM

## 2022-07-20 DIAGNOSIS — R55 Syncope and collapse: Secondary | ICD-10-CM | POA: Diagnosis not present

## 2022-07-20 NOTE — Patient Instructions (Signed)
Medication Instructions:   Your physician recommends that you continue on your current medications as directed. Please refer to the Current Medication list given to you today.  *If you need a refill on your cardiac medications before your next appointment, please call your pharmacy*   Lab Work:  None Ordered  If you have labs (blood work) drawn today and your tests are completely normal, you will receive your results only by: MyChart Message (if you have MyChart) OR A paper copy in the mail If you have any lab test that is abnormal or we need to change your treatment, we will call you to review the results.   Testing/Procedures:  None Ordered   Follow-Up: At [facility], you and your health needs are our priority.  As part of our continuing mission to provide you with exceptional heart care, we have created designated Provider Care Teams.  These Care Teams include your primary Cardiologist (physician) and Advanced Practice Providers (APPs -  Physician Assistants and Nurse Practitioners) who all work together to provide you with the care you need, when you need it.  We recommend signing up for the patient portal called "MyChart".  Sign up information is provided on this After Visit Summary.  MyChart is used to connect with patients for Virtual Visits (Telemedicine).  Patients are able to view lab/test results, encounter notes, upcoming appointments, etc.  Non-urgent messages can be sent to your provider as well.   To learn more about what you can do with MyChart, go to [email or web address].    Your next appointment:   12 month(s)  The format for your next appointment:   In Person  Provider:   You may see Brian Agbor-Etang, MD or one of the following Advanced Practice Providers on your designated Care Team:   Christopher Berge, NP Ryan Dunn, PA-C Cadence Furth, PA-C Sheri Hammock, NP    

## 2022-07-21 ENCOUNTER — Encounter: Payer: Self-pay | Admitting: Physical Therapy

## 2022-07-21 ENCOUNTER — Ambulatory Visit: Payer: PPO | Admitting: Physical Therapy

## 2022-07-21 DIAGNOSIS — M542 Cervicalgia: Secondary | ICD-10-CM | POA: Diagnosis not present
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
## 2022-07-23 ENCOUNTER — Ambulatory Visit (INDEPENDENT_AMBULATORY_CARE_PROVIDER_SITE_OTHER): Payer: PPO | Admitting: Family Medicine

## 2022-07-23 ENCOUNTER — Encounter (INDEPENDENT_AMBULATORY_CARE_PROVIDER_SITE_OTHER): Payer: Self-pay | Admitting: Family Medicine

## 2022-07-23 VITALS — BP 134/76 | HR 79 | Temp 98.2°F | Ht 60.0 in | Wt 222.0 lb

## 2022-07-23 DIAGNOSIS — E669 Obesity, unspecified: Secondary | ICD-10-CM

## 2022-07-23 DIAGNOSIS — E559 Vitamin D deficiency, unspecified: Secondary | ICD-10-CM | POA: Diagnosis not present

## 2022-07-23 DIAGNOSIS — R7303 Prediabetes: Secondary | ICD-10-CM

## 2022-07-23 DIAGNOSIS — S134XXD Sprain of ligaments of cervical spine, subsequent encounter: Secondary | ICD-10-CM | POA: Diagnosis not present

## 2022-07-23 DIAGNOSIS — Z6841 Body Mass Index (BMI) 40.0 and over, adult: Secondary | ICD-10-CM

## 2022-07-23 MED ORDER — VITAMIN D (ERGOCALCIFEROL) 1.25 MG (50000 UNIT) PO CAPS: 50000.0000 [IU] | ORAL_CAPSULE | ORAL | 0 refills | Status: DC

## 2022-07-24 ENCOUNTER — Encounter: Payer: Self-pay | Admitting: Physical Therapy

## 2022-07-24 ENCOUNTER — Ambulatory Visit: Payer: PPO | Admitting: Physical Therapy

## 2022-07-24 DIAGNOSIS — M542 Cervicalgia: Secondary | ICD-10-CM | POA: Diagnosis not present
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
## 2022-07-27 ENCOUNTER — Encounter (INDEPENDENT_AMBULATORY_CARE_PROVIDER_SITE_OTHER): Payer: Self-pay | Admitting: Family Medicine

## 2022-07-28 ENCOUNTER — Ambulatory Visit: Payer: PPO

## 2022-07-28 DIAGNOSIS — M542 Cervicalgia: Secondary | ICD-10-CM | POA: Diagnosis not present
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
## 2022-07-30 ENCOUNTER — Ambulatory Visit: Payer: PPO | Admitting: Physical Therapy

## 2022-07-31 ENCOUNTER — Encounter: Payer: Self-pay | Admitting: Physical Therapy

## 2022-07-31 ENCOUNTER — Ambulatory Visit: Payer: PPO | Attending: Family Medicine | Admitting: Physical Therapy

## 2022-07-31 DIAGNOSIS — M542 Cervicalgia: Secondary | ICD-10-CM | POA: Diagnosis not present
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
## 2022-07-31 NOTE — Telephone Encounter (Signed)
Patient started seeing [facility]  on 07/07/2022 [facility] Kelton Pillar, PT Physical Therapy

## 2022-08-02 DIAGNOSIS — G43909 Migraine, unspecified, not intractable, without status migrainosus: Secondary | ICD-10-CM

## 2022-08-03 ENCOUNTER — Ambulatory Visit: Payer: PPO | Admitting: Physical Therapy

## 2022-08-03 ENCOUNTER — Encounter: Payer: Self-pay | Admitting: Physical Therapy

## 2022-08-03 DIAGNOSIS — M542 Cervicalgia: Secondary | ICD-10-CM | POA: Diagnosis not present

## 2022-08-03 MED ORDER — SUMATRIPTAN SUCCINATE 100 MG PO TABS: ORAL_TABLET | ORAL | 0 refills | Status: DC
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
## 2022-08-05 ENCOUNTER — Encounter: Payer: Self-pay | Admitting: Physical Therapy

## 2022-08-05 ENCOUNTER — Ambulatory Visit: Payer: PPO | Admitting: Physical Therapy

## 2022-08-05 DIAGNOSIS — M542 Cervicalgia: Secondary | ICD-10-CM | POA: Diagnosis not present
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
## 2022-08-06 ENCOUNTER — Encounter (INDEPENDENT_AMBULATORY_CARE_PROVIDER_SITE_OTHER): Payer: Self-pay | Admitting: Family Medicine

## 2022-08-06 ENCOUNTER — Ambulatory Visit (INDEPENDENT_AMBULATORY_CARE_PROVIDER_SITE_OTHER): Payer: PPO | Admitting: Family Medicine

## 2022-08-06 VITALS — BP 130/81 | HR 69 | Temp 97.8°F | Ht 60.0 in | Wt 222.8 lb

## 2022-08-06 DIAGNOSIS — R7303 Prediabetes: Secondary | ICD-10-CM

## 2022-08-06 DIAGNOSIS — E669 Obesity, unspecified: Secondary | ICD-10-CM | POA: Diagnosis not present

## 2022-08-06 DIAGNOSIS — E559 Vitamin D deficiency, unspecified: Secondary | ICD-10-CM | POA: Diagnosis not present

## 2022-08-06 DIAGNOSIS — Z6841 Body Mass Index (BMI) 40.0 and over, adult: Secondary | ICD-10-CM

## 2022-08-06 MED ORDER — METFORMIN HCL 500 MG PO TABS: 500.0000 mg | ORAL_TABLET | Freq: Every day | ORAL | 0 refills | Status: DC

## 2022-08-07 ENCOUNTER — Encounter: Payer: Self-pay | Admitting: Physical Therapy

## 2022-08-10 ENCOUNTER — Ambulatory Visit: Payer: PPO | Admitting: Physical Therapy

## 2022-08-10 DIAGNOSIS — M542 Cervicalgia: Secondary | ICD-10-CM
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
## 2022-08-12 ENCOUNTER — Encounter: Payer: PPO | Admitting: Physical Therapy

## 2022-08-14 DIAGNOSIS — G4733 Obstructive sleep apnea (adult) (pediatric): Secondary | ICD-10-CM | POA: Diagnosis not present

## 2022-08-18 ENCOUNTER — Ambulatory Visit: Payer: PPO | Admitting: Physical Therapy

## 2022-08-18 ENCOUNTER — Ambulatory Visit (INDEPENDENT_AMBULATORY_CARE_PROVIDER_SITE_OTHER): Payer: PPO | Admitting: Family Medicine

## 2022-08-18 ENCOUNTER — Encounter (INDEPENDENT_AMBULATORY_CARE_PROVIDER_SITE_OTHER): Payer: Self-pay | Admitting: Family Medicine

## 2022-08-18 VITALS — BP 130/84 | HR 60 | Temp 98.0°F | Ht 60.0 in | Wt 221.0 lb

## 2022-08-18 DIAGNOSIS — Z6841 Body Mass Index (BMI) 40.0 and over, adult: Secondary | ICD-10-CM

## 2022-08-18 DIAGNOSIS — R7303 Prediabetes: Secondary | ICD-10-CM | POA: Diagnosis not present

## 2022-08-18 DIAGNOSIS — E559 Vitamin D deficiency, unspecified: Secondary | ICD-10-CM

## 2022-08-18 DIAGNOSIS — E88819 Insulin resistance, unspecified: Secondary | ICD-10-CM | POA: Diagnosis not present

## 2022-08-18 DIAGNOSIS — M542 Cervicalgia: Secondary | ICD-10-CM | POA: Diagnosis not present

## 2022-08-18 DIAGNOSIS — E669 Obesity, unspecified: Secondary | ICD-10-CM

## 2022-08-18 MED ORDER — VITAMIN D (ERGOCALCIFEROL) 1.25 MG (50000 UNIT) PO CAPS: 50000.0000 [IU] | ORAL_CAPSULE | ORAL | 0 refills | Status: DC

## 2022-08-18 MED ORDER — METFORMIN HCL 500 MG PO TABS: 500.0000 mg | ORAL_TABLET | Freq: Every day | ORAL | 0 refills | Status: DC
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
## 2022-08-19 ENCOUNTER — Encounter: Payer: Self-pay | Admitting: Physical Therapy

## 2022-08-24 ENCOUNTER — Encounter: Payer: PPO | Admitting: Physical Therapy

## 2022-08-24 NOTE — Progress Notes (Signed)
Chief Complaint:   OBESITY Preston Thomas is here to discuss his progress with his obesity treatment plan along with follow-up of his obesity related diagnoses. Preston Thomas is on the Category 2 Plan and states he is following his eating plan approximately 98% of the time. Preston Thomas states he is playing golf and doing physical therapy for 45 minutes 1-2 times per week.  Today's visit was #: 5 Starting weight: 229 lbs Starting date: 06/10/2022 Today's weight: 222 lbs Today's date: 08/06/2022 Total lbs lost to date: 7 Total lbs lost since last in-office visit: 0  Interim History: Preston Thomas has maintained his weight loss. His breakfast is 2 eggs, 2 slices of 45 calorie bread, and Fairlife milk. Lunch is a sandwich with 4 oz of meat and cheese or grilled nuggets. Dinner is 6 oz of meat with vegetables or salad with skinny girl dressing.   Subjective:   1. Prediabetes Preston Thomas is taking metformin with no side effects noted. His last A1c was 6.2 and insulin 34.2.  2. Vitamin D deficiency [place] last Vitamin D level was 35.8. He is taking Vitamin D prescription with no side effects noted.   Assessment/Plan:   1. Prediabetes Preston Thomas will continue his diet, exercise, and metformin. We will refill metformin for 1 month.  2. Vitamin D deficiency Preston Thomas will continue prescription Vitamin D. We will need to check his level 2-3 times yearly to avoid over-supplementation.   3. Obesity, Current BMI 43.5 Preston Thomas is currently in the action stage of change. As such, his goal is to continue with weight loss efforts. He has agreed to the Category 2 Plan.   Exercise goals: As is, add strengthening.   Behavioral modification strategies: increasing lean protein intake, decreasing simple carbohydrates, and holiday eating strategies .  Preston Thomas has agreed to follow-up with our clinic in 2 weeks. He was informed of the importance of frequent follow-up visits to maximize his success with intensive lifestyle  modifications for his multiple health conditions.   Objective:   Blood pressure 130/81, pulse 69, temperature 97.8 F (36.6 C), height 5' (1.524 m), weight 222 lb 12.8 oz (101.1 kg), SpO2 96 %. Body mass index is 43.51 kg/m.  General: Cooperative, alert, well developed, in no acute distress. HEENT: Conjunctivae and lids unremarkable. Cardiovascular: Regular rhythm.  Lungs: Normal work of breathing. Neurologic: No focal deficits.   Lab Results  Component Value Date   CREATININE 0.95 06/10/2022   BUN 14 06/10/2022   NA 144 06/10/2022   K 4.5 06/10/2022   CL 104 06/10/2022   CO2 20 06/10/2022   Lab Results  Component Value Date   ALT 29 06/10/2022   AST 17 06/10/2022   ALKPHOS 66 06/10/2022   BILITOT 0.3 06/10/2022   Lab Results  Component Value Date   HGBA1C 6.2 (H) 06/10/2022   HGBA1C 5.8 (A) 12/19/2021   HGBA1C 6.1 (A) 03/17/2021   HGBA1C 6.1 09/02/2020   Lab Results  Component Value Date   INSULIN 34.2 (H) 06/10/2022   Lab Results  Component Value Date   TSH 2.420 06/10/2022   Lab Results  Component Value Date   CHOL 145 06/10/2022   HDL 36 (L) 06/10/2022   LDLCALC 89 06/10/2022   LDLDIRECT 167.0 05/15/2019   TRIG 110 06/10/2022   CHOLHDL 4 12/19/2021   Lab Results  Component Value Date   VD25OH 35.8 06/10/2022   Lab Results  Component Value Date   WBC 7.1 06/10/2022   HGB 14.1 06/10/2022   HCT 42.6  06/10/2022   MCV 91 06/10/2022   PLT 338 06/10/2022   Lab Results  Component Value Date   FERRITIN 986 (H) 04/26/2019   Attestation Statements:   Reviewed by clinician on day of visit: allergies, medications, problem list, medical history, surgical history, family history, social history, and previous encounter notes.   I, Trixie Dredge, am acting as transcriptionist for Dennard Nip, MD.  I have reviewed the above documentation for accuracy and completeness, and I agree with the above. -  Dennard Nip, MD

## 2022-08-25 ENCOUNTER — Encounter: Payer: Self-pay | Admitting: Physical Therapy

## 2022-08-25 ENCOUNTER — Ambulatory Visit: Payer: PPO | Admitting: Physical Therapy

## 2022-08-25 DIAGNOSIS — M542 Cervicalgia: Secondary | ICD-10-CM
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
## 2022-08-28 ENCOUNTER — Encounter: Payer: PPO | Admitting: Physical Therapy

## 2022-09-01 ENCOUNTER — Encounter: Payer: PPO | Admitting: Physical Therapy

## 2022-09-01 NOTE — Progress Notes (Signed)
Chief Complaint:   OBESITY Preston Thomas is here to discuss his progress with his obesity treatment plan along with follow-up of his obesity related diagnoses. Preston Thomas is on the Category 3 Plan and states he is following his eating plan approximately 98% of the time. Preston Thomas states he is doing PT, weights,  45 minutes 2 times per week.  Today's visit was #: 6 Starting weight: 229 lbs Starting date: 06/10/2022 Today's weight: 221 lbs Today's date: 08/18/2022 Total lbs lost to date: 8 lbs Total lbs lost since last in-office visit: 1 lb  Interim History: Moved snacks to morning and afternoon.  Feeling adequately full.  Denies any hunger or cravings.  Seeing less swelling.  Celebrated his birthday.  In PT for neck pain 2 times per week.   Subjective:   1. Prediabetes Last A1c 6.2 on 09/13. Fasting insulin 34.2.  Doing well on metformin 500 mg every morning.  Denies adverse side effects.   2. Vitamin D deficiency He is currently taking prescription vitamin D 50,000 IU each week. He denies nausea, vomiting or muscle weakness  3. Insulin resistance Fasting insulin level 34.2 on  9/13.  Has reduced intake of sugar and starches.    Assessment/Plan:   1. Prediabetes Refill - metFORMIN (GLUCOPHAGE) 500 MG tablet; Take 1 tablet (500 mg total) by mouth daily.  Dispense: 30 tablet; Refill: 0  2. Vitamin D deficiency Refill - Vitamin D, Ergocalciferol, (DRISDOL) 1.25 MG (50000 UNIT) CAPS capsule; Take 1 capsule (50,000 Units total) by mouth every 7 (seven) days.  Dispense: 4 capsule; Refill: 0  3. Insulin resistance Plan to ramp up walking time to help with insulin resistance.   4. Obesity,current BMI 43.2 Plans to check out gym nearby.  Try cardio and weights 3 times per week.   Preston Thomas is currently in the action stage of change. As such, his goal is to continue with weight loss efforts. He has agreed to the Category 3 Plan.   Exercise goals:  As is.   Behavioral modification  strategies: increasing lean protein intake, increasing vegetables, increasing water intake, decreasing eating out, no skipping meals, meal planning and cooking strategies, holiday eating strategies , and decreasing junk food.  Preston Thomas has agreed to follow-up with our clinic in 4 weeks. He was informed of the importance of frequent follow-up visits to maximize his success with intensive lifestyle modifications for his multiple health conditions.   Objective:   Blood pressure 130/84, pulse 60, temperature 98 F (36.7 C), height 5' (1.524 m), weight 221 lb (100.2 kg), SpO2 100 %. Body mass index is 43.16 kg/m.  General: Cooperative, alert, well developed, in no acute distress. HEENT: Conjunctivae and lids unremarkable. Cardiovascular: Regular rhythm.  Lungs: Normal work of breathing. Neurologic: No focal deficits.   Lab Results  Component Value Date   CREATININE 0.95 06/10/2022   BUN 14 06/10/2022   NA 144 06/10/2022   K 4.5 06/10/2022   CL 104 06/10/2022   CO2 20 06/10/2022   Lab Results  Component Value Date   ALT 29 06/10/2022   AST 17 06/10/2022   ALKPHOS 66 06/10/2022   BILITOT 0.3 06/10/2022   Lab Results  Component Value Date   HGBA1C 6.2 (H) 06/10/2022   HGBA1C 5.8 (A) 12/19/2021   HGBA1C 6.1 (A) 03/17/2021   HGBA1C 6.1 09/02/2020   Lab Results  Component Value Date   INSULIN 34.2 (H) 06/10/2022   Lab Results  Component Value Date   TSH 2.420 06/10/2022  Lab Results  Component Value Date   CHOL 145 06/10/2022   HDL 36 (L) 06/10/2022   LDLCALC 89 06/10/2022   LDLDIRECT 167.0 05/15/2019   TRIG 110 06/10/2022   CHOLHDL 4 12/19/2021   Lab Results  Component Value Date   VD25OH 35.8 06/10/2022   Lab Results  Component Value Date   WBC 7.1 06/10/2022   HGB 14.1 06/10/2022   HCT 42.6 06/10/2022   MCV 91 06/10/2022   PLT 338 06/10/2022   Lab Results  Component Value Date   FERRITIN 986 (H) 04/26/2019   Attestation Statements:   Reviewed by  clinician on day of visit: allergies, medications, problem list, medical history, surgical history, family history, social history, and previous encounter notes.  I, Davy Pique, am acting as Location manager for Loyal Gambler, DO.  I have reviewed the above documentation for accuracy and completeness, and I agree with the above. Dell Ponto, DO

## 2022-09-02 ENCOUNTER — Ambulatory Visit (INDEPENDENT_AMBULATORY_CARE_PROVIDER_SITE_OTHER): Payer: PPO | Admitting: Family Medicine

## 2022-09-02 ENCOUNTER — Encounter (INDEPENDENT_AMBULATORY_CARE_PROVIDER_SITE_OTHER): Payer: Self-pay | Admitting: Family Medicine

## 2022-09-02 VITALS — BP 109/72 | HR 90 | Temp 98.3°F | Ht 60.0 in | Wt 220.0 lb

## 2022-09-02 DIAGNOSIS — E668 Other obesity: Secondary | ICD-10-CM | POA: Diagnosis not present

## 2022-09-02 DIAGNOSIS — E559 Vitamin D deficiency, unspecified: Secondary | ICD-10-CM

## 2022-09-02 DIAGNOSIS — Z6841 Body Mass Index (BMI) 40.0 and over, adult: Secondary | ICD-10-CM

## 2022-09-02 DIAGNOSIS — E65 Localized adiposity: Secondary | ICD-10-CM

## 2022-09-02 DIAGNOSIS — R7303 Prediabetes: Secondary | ICD-10-CM

## 2022-09-02 MED ORDER — METFORMIN HCL 500 MG PO TABS: 500.0000 mg | ORAL_TABLET | Freq: Every day | ORAL | 0 refills | Status: DC

## 2022-09-02 MED ORDER — VITAMIN D (ERGOCALCIFEROL) 1.25 MG (50000 UNIT) PO CAPS: 50000.0000 [IU] | ORAL_CAPSULE | ORAL | 0 refills | Status: DC

## 2022-09-03 ENCOUNTER — Encounter: Payer: Self-pay | Admitting: Physical Therapy

## 2022-09-03 ENCOUNTER — Encounter: Payer: PPO | Admitting: Physical Therapy

## 2022-09-03 ENCOUNTER — Ambulatory Visit: Payer: PPO | Attending: Family Medicine | Admitting: Physical Therapy

## 2022-09-03 DIAGNOSIS — M542 Cervicalgia: Secondary | ICD-10-CM | POA: Diagnosis not present

## 2022-09-03 DIAGNOSIS — E65 Localized adiposity: Secondary | ICD-10-CM | POA: Insufficient documentation
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
## 2022-09-07 ENCOUNTER — Ambulatory Visit (INDEPENDENT_AMBULATORY_CARE_PROVIDER_SITE_OTHER): Payer: PPO | Admitting: Family Medicine

## 2022-09-08 ENCOUNTER — Encounter: Payer: PPO | Admitting: Physical Therapy

## 2022-09-09 ENCOUNTER — Encounter: Payer: PPO | Admitting: Physical Therapy

## 2022-09-09 NOTE — Progress Notes (Signed)
Chief Complaint:   OBESITY Preston Thomas is here to discuss his progress with his obesity treatment plan along with follow-up of his obesity related diagnoses. Preston Thomas is on the Category 3 Plan and states he is following his eating plan approximately 98% of the time. Preston Thomas states he is going to PCP and weight training 45 minutes 2 times per week.  Today's visit was #: 7 Starting weight: 229 LBS Starting date: 06/10/2022 Today's weight: 220 LBS Today's date: 09/02/2022 Total lbs lost to date: 9 LBS Total lbs lost since last in-office visit: 1 LB  Interim History: Patient is doing well meal plan he is going to PT 45 minutes 2 times per week.  He has good support at home.  He has been drinking more water and denies cravings or hunger.  He brings food to work.  His energy level has improved.  Subjective:   1. Prediabetes Patient is doing well on metformin 500 mg once daily with food.  Last A1c was 6.2 on 06/10/2022.  Fasting insulin was 34.2 on 06/10/2022.  2. Vitamin D deficiency Vitamin D level was 35.8 on 06/10/2022.  Patient is on prescription vitamin D 50,000 IU weekly.  3. Visceral obesity Improving.  Visceral fat rating 27, it started at 30 with calorie restriction and increased exercise.  Assessment/Plan:   1. Prediabetes Recheck labs in 1 to 2 months.  Refill- metFORMIN (GLUCOPHAGE) 500 MG tablet; Take 1 tablet (500 mg total) by mouth daily.  Dispense: 30 tablet; Refill: 0  2. Vitamin D deficiency Recheck level in 1 to 2 months.  Refill- Vitamin D, Ergocalciferol, (DRISDOL) 1.25 MG (50000 UNIT) CAPS capsule; Take 1 capsule (50,000 Units total) by mouth every 7 (seven) days.  Dispense: 4 capsule; Refill: 0  3. Visceral obesity Adding 30 minutes of walking 2 days/week.  4. Obesity,current BMI 43.0 Patient is visceral fat rating has decreased by 1 point.  He has also decreased 1.8 LB of body fat mass, increased 1.0 LB of muscle mass.  Preston Thomas is currently in the action  stage of change. As such, his goal is to continue with weight loss efforts. He has agreed to the Category 3 Plan.   Exercise goals:  Adding 30 minutes of walking 2 times a week  Behavioral modification strategies: increasing lean protein intake, increasing vegetables, increasing water intake, decreasing eating out, no skipping meals, meal planning and cooking strategies, keeping healthy foods in the home, and holiday eating strategies .  Rand has agreed to follow-up with our clinic in 4 weeks. He was informed of the importance of frequent follow-up visits to maximize his success with intensive lifestyle modifications for his multiple health conditions.   Objective:   Blood pressure 109/72, pulse 90, temperature 98.3 F (36.8 C), height 5' (1.524 m), weight 220 lb (99.8 kg), SpO2 95 %. Body mass index is 42.97 kg/m.  General: Cooperative, alert, well developed, in no acute distress. HEENT: Conjunctivae and lids unremarkable. Cardiovascular: Regular rhythm.  Lungs: Normal work of breathing. Neurologic: No focal deficits.   Lab Results  Component Value Date   CREATININE 0.95 06/10/2022   BUN 14 06/10/2022   NA 144 06/10/2022   K 4.5 06/10/2022   CL 104 06/10/2022   CO2 20 06/10/2022   Lab Results  Component Value Date   ALT 29 06/10/2022   AST 17 06/10/2022   ALKPHOS 66 06/10/2022   BILITOT 0.3 06/10/2022   Lab Results  Component Value Date   HGBA1C 6.2 (H) 06/10/2022  HGBA1C 5.8 (A) 12/19/2021   HGBA1C 6.1 (A) 03/17/2021   HGBA1C 6.1 09/02/2020   Lab Results  Component Value Date   INSULIN 34.2 (H) 06/10/2022   Lab Results  Component Value Date   TSH 2.420 06/10/2022   Lab Results  Component Value Date   CHOL 145 06/10/2022   HDL 36 (L) 06/10/2022   LDLCALC 89 06/10/2022   LDLDIRECT 167.0 05/15/2019   TRIG 110 06/10/2022   CHOLHDL 4 12/19/2021   Lab Results  Component Value Date   VD25OH 35.8 06/10/2022   Lab Results  Component Value Date   WBC 7.1  06/10/2022   HGB 14.1 06/10/2022   HCT 42.6 06/10/2022   MCV 91 06/10/2022   PLT 338 06/10/2022   Lab Results  Component Value Date   FERRITIN 986 (H) 04/26/2019   Attestation Statements:   Reviewed by clinician on day of visit: allergies, medications, problem list, medical history, surgical history, family history, social history, and previous encounter notes.  I, Davy Pique, am acting as Location manager for Loyal Gambler, DO.   I have reviewed the above documentation for accuracy and completeness, and I agree with the above. Dell Ponto, DO

## 2022-09-11 ENCOUNTER — Encounter: Payer: PPO | Admitting: Physical Therapy

## 2022-09-13 ENCOUNTER — Telehealth: Payer: PPO | Admitting: Physician Assistant

## 2022-09-13 DIAGNOSIS — H1033 Unspecified acute conjunctivitis, bilateral: Secondary | ICD-10-CM | POA: Diagnosis not present

## 2022-09-14 ENCOUNTER — Ambulatory Visit (INDEPENDENT_AMBULATORY_CARE_PROVIDER_SITE_OTHER): Payer: PPO

## 2022-09-14 VITALS — Ht 60.0 in | Wt 219.0 lb

## 2022-09-14 DIAGNOSIS — Z0000 Encounter for general adult medical examination without abnormal findings: Secondary | ICD-10-CM

## 2022-09-14 MED ORDER — POLYMYXIN B-TRIMETHOPRIM 10000-0.1 UNIT/ML-% OP SOLN: 1.0000 [drp] | OPHTHALMIC | 0 refills | Status: DC

## 2022-09-14 NOTE — Progress Notes (Signed)

## 2022-09-14 NOTE — Patient Instructions (Addendum)
Preston Thomas , Thank you for taking time to come for your Medicare Wellness Visit. I appreciate your ongoing commitment to your health goals. Please review the following plan we discussed and let me know if I can assist you in the future.   These are the goals we discussed:  Goals       Lose weight (pt-stated)      Patient Stated      09/10/2020, I will maintain and continue medications as prescribed.       Patient Stated      Would like to drink more water, eat healthier and start walking        This is a list of the screening recommended for you and due dates:  Health Maintenance  Topic Date Due   DTaP/Tdap/Td vaccine (2 - Tdap) 04/25/2013   Zoster (Shingles) Vaccine (1 of 2) 12/14/2022*   Flu Shot  12/27/2022*   Medicare Annual Wellness Visit  09/15/2023   Colon Cancer Screening  09/28/2026   Pneumonia Vaccine  Completed   Hepatitis C Screening: USPSTF Recommendation to screen - Ages 18-79 yo.  Completed   HPV Vaccine  Aged Out   COVID-19 Vaccine  Discontinued  *Topic was postponed. The date shown is not the original due date.    Advanced directives: Please bring a copy of your health care power of attorney and living will to the office to be added to your chart at your convenience.   Conditions/risks identified: None  Next appointment: Follow up in one year for your annual wellness visit.    Preventive Care 8 Years and Older, Male  Preventive care refers to lifestyle choices and visits with your health care provider that can promote health and wellness. What does preventive care include? A yearly physical exam. This is also called an annual well check. Dental exams once or twice a year. Routine eye exams. Ask your health care provider how often you should have your eyes checked. Personal lifestyle choices, including: Daily care of your teeth and gums. Regular physical activity. Eating a healthy diet. Avoiding tobacco and drug use. Limiting alcohol use. Practicing  safe sex. Taking low doses of aspirin every day. Taking vitamin and mineral supplements as recommended by your health care provider. What happens during an annual well check? The services and screenings done by your health care provider during your annual well check will depend on your age, overall health, lifestyle risk factors, and family history of disease. Counseling  Your health care provider may ask you questions about your: Alcohol use. Tobacco use. Drug use. Emotional well-being. Home and relationship well-being. Sexual activity. Eating habits. History of falls. Memory and ability to understand (cognition). Work and work Statistician. Screening  You may have the following tests or measurements: Height, weight, and BMI. Blood pressure. Lipid and cholesterol levels. These may be checked every 5 years, or more frequently if you are over 57 years old. Skin check. Lung cancer screening. You may have this screening every year starting at age 63 if you have a 30-pack-year history of smoking and currently smoke or have quit within the past 15 years. Fecal occult blood test (FOBT) of the stool. You may have this test every year starting at age 9. Flexible sigmoidoscopy or colonoscopy. You may have a sigmoidoscopy every 5 years or a colonoscopy every 10 years starting at age 44. Prostate cancer screening. Recommendations will vary depending on your family history and other risks. Hepatitis C blood test. Hepatitis B blood test.  Sexually transmitted disease (STD) testing. Diabetes screening. This is done by checking your blood sugar (glucose) after you have not eaten for a while (fasting). You may have this done every 1-3 years. Abdominal aortic aneurysm (AAA) screening. You may need this if you are a current or former smoker. Osteoporosis. You may be screened starting at age 24 if you are at high risk. Talk with your health care provider about your test results, treatment options, and  if necessary, the need for more tests. Vaccines  Your health care provider may recommend certain vaccines, such as: Influenza vaccine. This is recommended every year. Tetanus, diphtheria, and acellular pertussis (Tdap, Td) vaccine. You may need a Td booster every 10 years. Zoster vaccine. You may need this after age 41. Pneumococcal 13-valent conjugate (PCV13) vaccine. One dose is recommended after age 76. Pneumococcal polysaccharide (PPSV23) vaccine. One dose is recommended after age 2. Talk to your health care provider about which screenings and vaccines you need and how often you need them. This information is not intended to replace advice given to you by your health care provider. Make sure you discuss any questions you have with your health care provider. Document Released: 10/11/2015 Document Revised: 06/03/2016 Document Reviewed: 07/16/2015 Elsevier Interactive Patient Education  2017 [place] Prevention in the Home Falls can cause injuries. They can happen to people of all ages. There are many things you can do to make your home safe and to help prevent falls. What can I do on the outside of my home? Regularly fix the edges of walkways and driveways and fix any cracks. Remove anything that might make you trip as you walk through a door, such as a raised step or threshold. Trim any bushes or trees on the path to your home. Use bright outdoor lighting. Clear any walking paths of anything that might make someone trip, such as rocks or tools. Regularly check to see if handrails are loose or broken. Make sure that both sides of any steps have handrails. Any raised decks and porches should have guardrails on the edges. Have any leaves, snow, or ice cleared regularly. Use sand or salt on walking paths during winter. Clean up any spills in your garage right away. This includes oil or grease spills. What can I do in the bathroom? Use night lights. Install grab bars by the  toilet and in the tub and shower. Do not use towel bars as grab bars. Use non-skid mats or decals in the tub or shower. If you need to sit down in the shower, use a plastic, non-slip stool. Keep the floor dry. Clean up any water that spills on the floor as soon as it happens. Remove soap buildup in the tub or shower regularly. Attach bath mats securely with double-sided non-slip rug tape. Do not have throw rugs and other things on the floor that can make you trip. What can I do in the bedroom? Use night lights. Make sure that you have a light by your bed that is easy to reach. Do not use any sheets or blankets that are too big for your bed. They should not hang down onto the floor. Have a firm chair that has side arms. You can use this for support while you get dressed. Do not have throw rugs and other things on the floor that can make you trip. What can I do in the kitchen? Clean up any spills right away. Avoid walking on wet floors. Keep items that you  use a lot in easy-to-reach places. If you need to reach something above you, use a strong step stool that has a grab bar. Keep electrical cords out of the way. Do not use floor polish or wax that makes floors slippery. If you must use wax, use non-skid floor wax. Do not have throw rugs and other things on the floor that can make you trip. What can I do with my stairs? Do not leave any items on the stairs. Make sure that there are handrails on both sides of the stairs and use them. Fix handrails that are broken or loose. Make sure that handrails are as long as the stairways. Check any carpeting to make sure that it is firmly attached to the stairs. Fix any carpet that is loose or worn. Avoid having throw rugs at the top or bottom of the stairs. If you do have throw rugs, attach them to the floor with carpet tape. Make sure that you have a light switch at the top of the stairs and the bottom of the stairs. If you do not have them, ask someone  to add them for you. What else can I do to help prevent falls? Wear shoes that: Do not have high heels. Have rubber bottoms. Are comfortable and fit you well. Are closed at the toe. Do not wear sandals. If you use a stepladder: Make sure that it is fully opened. Do not climb a closed stepladder. Make sure that both sides of the stepladder are locked into place. Ask someone to hold it for you, if possible. Clearly mark and make sure that you can see: Any grab bars or handrails. First and last steps. Where the edge of each step is. Use tools that help you move around (mobility aids) if they are needed. These include: Canes. Walkers. Scooters. Crutches. Turn on the lights when you go into a dark area. Replace any light bulbs as soon as they burn out. Set up your furniture so you have a clear path. Avoid moving your furniture around. If any of your floors are uneven, fix them. If there are any pets around you, be aware of where they are. Review your medicines with your doctor. Some medicines can make you feel dizzy. This can increase your chance of falling. Ask your doctor what other things that you can do to help prevent falls. This information is not intended to replace advice given to you by your health care provider. Make sure you discuss any questions you have with your health care provider. Document Released: 07/11/2009 Document Revised: 02/20/2016 Document Reviewed: 10/19/2014 Elsevier Interactive Patient Education  2017 [employer].

## 2022-09-14 NOTE — Progress Notes (Addendum)
Subjective:   Preston Thomas is a 71 y.o. male who presents for Medicare Annual/Subsequent preventive examination.  Review of Systems    Virtual Visit via Telephone Note  I connected with  Preston Thomas on 09/14/22 at  9:30 AM EST by telephone and verified that I am speaking with the correct person using two identifiers.  Location: Patient: Home Provider: Office Persons participating in the virtual visit: patient/Nurse Health Advisor   I discussed the limitations, risks, security and privacy concerns of performing an evaluation and management service by telephone and the availability of in person appointments. The patient expressed understanding and agreed to proceed.  Interactive audio and video telecommunications were attempted between this nurse and patient, however failed, due to patient having technical difficulties OR patient did not have access to video capability.  We continued and completed visit with audio only.  Some vital signs may be absent or patient reported.   Preston Peaches, LPN  Cardiac Risk Factors include: advanced age (>94mn, >>28women);hypertension;male gender     Objective:    Today's Vitals   09/14/22 1020  Weight: 219 lb (99.3 kg)  Height: 5' (1.524 m)   Body mass index is 42.77 kg/m.     09/14/2022   10:27 AM 07/07/2022    2:02 PM 09/12/2021    8:20 AM 09/10/2020    8:14 AM 04/29/2020    9:04 AM 10/30/2019    9:36 AM 07/26/2019    9:18 AM  Advanced Directives  Does Patient Have a Medical Advance Directive? Yes Yes Yes Yes No Yes Yes  Type of [place] will Healthcare Power of [place] will [place] will  [place] will [place] will  Does patient want to make changes to medical advance directive?  No - Patient declined Yes (MAU/Ambulatory/Procedural Areas -  Information given)   No - Patient declined No - Patient declined  Copy of [place] Chart? No - copy requested   No - copy requested  No - copy requested No - copy requested  Would patient like information on creating a medical advance directive?     No - Patient declined      Current Medications (verified) Outpatient Encounter Medications as of 09/14/2022  Medication Sig   atorvastatin (LIPITOR) 20 MG tablet Take 1 tablet (20 mg total) by mouth daily. for cholesterol.   clobetasol ointment (TEMOVATE) 09.51% Apply 1 application topically 2 (two) times daily.   fluocinonide (LIDEX) 0.05 % external solution Apply 1 application topically 2 (two) times daily.   fluticasone (CUTIVATE) 0.05 % cream Apply topically 2 (two) times daily.   metFORMIN (GLUCOPHAGE) 500 MG tablet Take 1 tablet (500 mg total) by mouth daily.   omeprazole (PRILOSEC) 20 MG capsule Take 20 mg by mouth daily.   SUMAtriptan (IMITREX) 100 MG tablet TAKE 1 TABLET BY MOUTH AT MIGRAINE ONSET,MAY REPEAT IN 2 HOURS IF HEADACHE PERSISTS/RECURS *DO NOT EXCEED '100MG'$  IN 24 HOURS**   tamsulosin (FLOMAX) 0.4 MG CAPS capsule TAKE 1 CAPSULE BY MOUTH ONCE DAILY   triamcinolone (KENALOG) 0.025 % cream Apply 1 application topically 2 (two) times daily.   trimethoprim-polymyxin b (POLYTRIM) ophthalmic solution Place 1 drop into both eyes every 4 (four) hours. X 5 days   vitamin B-12 (CYANOCOBALAMIN) 500 MCG tablet Take 500 mcg by mouth daily.   vitamin C (ASCORBIC ACID) 250 MG tablet  Take 250 mg by mouth daily.   Vitamin D, Ergocalciferol, (DRISDOL) 1.25 MG (50000 UNIT) CAPS capsule Take 1 capsule (50,000 Units total) by mouth every 7 (seven) days.   Zinc 100 MG TABS Take by mouth.   No facility-administered encounter medications on file as of 09/14/2022.    Allergies (verified) Penicillins   History: Past Medical History:  Diagnosis Date   Bilateral lower extremity edema    Cancer ([place])    COLONIC POLYPS, HX OF  08/31/2007   Qualifier: Diagnosis of  By: Silvio Pate MD, [facility]    DIVERTICULITIS, HX OF 08/31/2007   Qualifier: Diagnosis of  By: Joesph Fillers CMA (AAMA), Natasha     DIVERTICULOSIS, COLON 08/31/2007   Qualifier: Diagnosis of  By: Silvio Pate MD, [facility]    Dyspnea    Related to Covid   Gallbladder problem    GERD (gastroesophageal reflux disease)    History of kidney stones    Pneumonia due to COVID-19 virus 04/19/2019   RENAL CALCULUS, HX OF 08/31/2007   Qualifier: Diagnosis of  By: Joesph Fillers CMA (AAMA), Natasha     Sleep apnea    Swallowing difficulty    Past Surgical History:  Procedure Laterality Date   [place]   RADIOACTIVE SEED IMPLANT N/A 06/27/2019   Procedure: RADIOACTIVE SEED IMPLANT/BRACHYTHERAPY IMPLANT;  Surgeon: Abbie Sons, MD;  Location: [facility] ORS;  Service: Urology;  Laterality: N/A;   Family History  Problem Relation Age of Onset   Early death Father    Kidney disease Father    Heart failure Sister    Social History   Socioeconomic History   Marital status: Married    Spouse name: Not on file   Number of children: Not on file   Years of education: Not on file   Highest education level: Not on file  Occupational History   Not on file  Tobacco Use   Smoking status: Former    Packs/day: 1.50    Years: 10.00    Total pack years: 15.00    Types: Cigarettes    Quit date: 09/28/1984    Years since quitting: 37.9   Smokeless tobacco: Never  Vaping Use   Vaping Use: Never used  Substance and Sexual Activity   Alcohol use: Not Currently   Drug use: Never   Sexual activity: Yes  Other Topics Concern   Not on file  Social History Narrative   Not on file   Social Determinants of Health   Financial Resource Strain: Low Risk  (09/14/2022)   Overall Financial Resource Strain (CARDIA)    Difficulty of Paying Living Expenses: Not hard at all  Food Insecurity: No Food Insecurity (09/14/2022)   Hunger Vital Sign     Worried About Running Out of Food in the Last Year: Never true    [place] in the Last Year: Never true  Transportation Needs: No Transportation Needs (09/14/2022)   PRAPARE - Hydrologist (Medical): No    Lack of Transportation (Non-Medical): No  Physical Activity: Insufficiently Active (09/14/2022)   Exercise Vital Sign    Days of Exercise per Week: 2 days    Minutes of Exercise per Session: 60 min  Stress: No Stress Concern Present (09/14/2022)   [place]    Feeling of Stress : Not at all  Social Connections: [place] (09/14/2022)   Social Connection and Isolation Panel [  NHANES]    Frequency of Communication with Friends and Family: More than three times a week    Frequency of Social Gatherings with Friends and Family: More than three times a week    Attends Religious Services: More than 4 times per year    Active Member of [employer] or Organizations: Yes    Attends Music therapist: More than 4 times per year    Marital Status: Married    Tobacco Counseling Counseling given: Not Answered   Clinical Intake:  Pre-visit preparation completed: Yes  Pain : No/denies pain     BMI - recorded: 42.77 Nutritional Status: BMI > 30  Obese Nutritional Risks: None Diabetes: No  How often do you need to have someone help you when you read instructions, pamphlets, or other written materials from your doctor or pharmacy?: 1 - Never  Diabetic?  No  Interpreter Needed?: No      Activities of Daily Living    09/14/2022   10:26 AM 09/08/2022    6:37 PM  In your present state of health, do you have any difficulty performing the following activities:  Hearing? 0 0  Vision? 0 0  Difficulty concentrating or making decisions? 0 0  Walking or climbing stairs? 0 0  Dressing or bathing? 0 0  Doing errands, shopping? 0 0  Preparing Food and eating ? N N  Using  the Toilet? N N  In the past six months, have you accidently leaked urine? N N  Do you have problems with loss of bowel control? N N  Managing your Medications? N N  Managing your Finances? N N  Housekeeping or managing your Housekeeping? N N    Patient Care Team: Pleas [name], NP as PCP - General (Internal Medicine) Kate Sable, MD as PCP - Cardiology (Cardiology)  Indicate any recent Medical Services you may have received from other than [facility] providers in the past year (date may be approximate).     Assessment:   This is a routine wellness examination for [place].  Hearing/Vision screen Hearing Screening - Comments:: Denies hearing difficulties   Vision Screening - Comments:: Wears rx glasses - up to date with routine eye exams with  Dr Prudencio Burly  Dietary issues and exercise activities discussed: Current Exercise Habits: Home exercise routine, Type of exercise: walking, Time (Minutes): 60, Frequency (Times/Week): 2, Weekly Exercise (Minutes/Week): 120, Intensity: Moderate, Exercise limited by: None identified   Goals Addressed               This Visit's Progress     Lose weight (pt-stated)         Depression Screen    09/14/2022   10:24 AM 06/10/2022   10:38 AM 12/19/2021   12:00 PM 09/12/2021    8:23 AM 09/12/2020    9:13 AM 09/10/2020    8:17 AM  PHQ 2/9 Scores  PHQ - 2 Score 0 1 0 0 0 0  PHQ- 9 Score  4   0 0    Fall Risk    09/14/2022   10:26 AM 09/08/2022    6:37 PM 12/19/2021   12:00 PM 09/12/2021    8:22 AM 09/12/2020    9:13 AM  [place] in the past year? 0 0 0 0 0  Number falls in past yr: 0 0 0 0 0  Injury with Fall? 0 0 0 0 0  Risk for fall due to : No Fall Risks   No Fall  Risks   Follow up Falls prevention discussed  Falls evaluation completed Falls prevention discussed     FALL RISK PREVENTION PERTAINING TO THE HOME:  Any stairs in or around the home? No If so, are there any without handrails? No  Home free of loose  throw rugs in walkways, pet beds, electrical cords, etc? Yes  Adequate lighting in your home to reduce risk of falls? Yes   ASSISTIVE DEVICES UTILIZED TO PREVENT FALLS:  Life alert? No  Use of a cane, walker or w/c? No  Grab bars in the bathroom? No  Shower chair or bench in shower? No  Elevated toilet seat or a handicapped toilet? No   TIMED UP AND GO:  Was the test performed? No . Audio Visit  Cognitive Function:    09/10/2020    8:20 AM  MMSE - Mini Mental State Exam  Orientation to time 5  Orientation to Place 5  Registration 3  Attention/ Calculation 5  Recall 3  Language- repeat 1        09/14/2022   10:28 AM  6CIT Screen  What Year? 0 points  What month? 0 points  What time? 0 points  Count back from 20 0 points  Months in reverse 0 points  Repeat phrase 0 points  Total Score 0 points    Immunizations Immunization History  Administered Date(s) Administered   Fluad Quad(high Dose 65+) 09/07/2019   Influenza,inj,Quad PF,6+ Mos 08/17/2018   PFIZER(Purple Top)SARS-COV-2 Vaccination 11/10/2019, 12/05/2019   PNEUMOCOCCAL CONJUGATE-20 12/19/2021   Pneumococcal Polysaccharide-23 08/17/2018   Td 04/26/2003    TDAP status: Due, Education has been provided regarding the importance of this vaccine. Advised may receive this vaccine at local pharmacy or Health Dept. Aware to provide a copy of the vaccination record if obtained from local pharmacy or Health Dept. Verbalized acceptance and understanding.  Flu Vaccine status: Declined, Education has been provided regarding the importance of this vaccine but patient still declined. Advised may receive this vaccine at local pharmacy or Health Dept. Aware to provide a copy of the vaccination record if obtained from local pharmacy or Health Dept. Verbalized acceptance and understanding.  Pneumococcal vaccine status: Up to date    Qualifies for Shingles Vaccine? Yes   Zostavax completed No   Shingrix Completed?: No.     Education has been provided regarding the importance of this vaccine. Patient has been advised to call insurance company to determine out of pocket expense if they have not yet received this vaccine. Advised may also receive vaccine at local pharmacy or Health Dept. Verbalized acceptance and understanding.  Screening Tests Health Maintenance  Topic Date Due   DTaP/Tdap/Td (2 - Tdap) 04/25/2013   Zoster Vaccines- Shingrix (1 of 2) 12/14/2022 (Originally 08/15/1970)   INFLUENZA VACCINE  12/27/2022 (Originally 04/28/2022)   Medicare Annual Wellness (AWV)  09/15/2023   COLONOSCOPY (Pts 45-73yr Insurance coverage will need to be confirmed)  09/28/2026   Pneumonia Vaccine 71 Years old  Completed   Hepatitis C Screening  Completed   HPV VACCINES  Aged Out   COVID-19 Vaccine  Discontinued    Health Maintenance  Health Maintenance Due  Topic Date Due   DTaP/Tdap/Td (2 - Tdap) 04/25/2013    Colorectal cancer screening: Type of screening: Colonoscopy. Completed 10/17/16. Repeat every 10 years  Lung Cancer Screening: (Low Dose CT Chest recommended if Age 69-80years, 30 pack-year currently smoking OR have quit w/in 15years.) does not qualify.     Additional Screening:  Hepatitis C Screening: does qualify; Completed 08/17/18  Vision Screening: Recommended annual ophthalmology exams for early detection of glaucoma and other disorders of the eye. Is the patient up to date with their annual eye exam?  Yes  Who is the provider or what is the name of the office in which the patient attends annual eye exams? Dr Prudencio Burly If pt is not established with a provider, would they like to be referred to a provider to establish care? No .   Dental Screening: Recommended annual dental exams for proper oral hygiene  Community Resource Referral / Chronic Care Management:  CRR required this visit?  No   CCM required this visit?  No      Plan:     I have personally reviewed and noted the following in  the patient's chart:   Medical and social history Use of alcohol, tobacco or illicit drugs  Current medications and supplements including opioid prescriptions. Patient is not currently taking opioid prescriptions. Functional ability and status Nutritional status Physical activity Advanced directives List of other physicians Hospitalizations, surgeries, and ER visits in previous 12 months Vitals Screenings to include cognitive, depression, and falls Referrals and appointments  In addition, I have reviewed and discussed with patient certain preventive protocols, quality metrics, and best practice recommendations. A written personalized care plan for preventive services as well as general preventive health recommendations were provided to patient.     Preston Peaches, LPN   [id number]   Nurse Notes: None

## 2022-09-15 ENCOUNTER — Encounter: Payer: PPO | Admitting: Physical Therapy

## 2022-09-17 ENCOUNTER — Other Ambulatory Visit: Payer: Self-pay | Admitting: Primary Care

## 2022-09-17 DIAGNOSIS — E785 Hyperlipidemia, unspecified: Secondary | ICD-10-CM

## 2022-09-18 ENCOUNTER — Encounter: Payer: PPO | Admitting: Physical Therapy

## 2022-09-18 NOTE — Telephone Encounter (Signed)
Please call pharmacy: One year supply was sent to pharmacy on 12/19/2021. How is he out of refills?

## 2022-09-18 NOTE — Telephone Encounter (Signed)
Called pharmacy she stated it was a mistake on their end. We can disregard the request.

## 2022-09-25 ENCOUNTER — Other Ambulatory Visit: Payer: Self-pay | Admitting: Primary Care

## 2022-09-25 DIAGNOSIS — E785 Hyperlipidemia, unspecified: Secondary | ICD-10-CM

## 2022-09-30 ENCOUNTER — Telehealth (INDEPENDENT_AMBULATORY_CARE_PROVIDER_SITE_OTHER): Payer: Self-pay | Admitting: *Deleted

## 2022-09-30 ENCOUNTER — Ambulatory Visit (INDEPENDENT_AMBULATORY_CARE_PROVIDER_SITE_OTHER): Payer: PPO | Admitting: Family Medicine

## 2022-09-30 ENCOUNTER — Encounter (INDEPENDENT_AMBULATORY_CARE_PROVIDER_SITE_OTHER): Payer: Self-pay | Admitting: Family Medicine

## 2022-09-30 VITALS — BP 128/83 | HR 80 | Temp 98.1°F | Ht 60.0 in | Wt 219.0 lb

## 2022-09-30 DIAGNOSIS — E559 Vitamin D deficiency, unspecified: Secondary | ICD-10-CM

## 2022-09-30 DIAGNOSIS — Z6841 Body Mass Index (BMI) 40.0 and over, adult: Secondary | ICD-10-CM

## 2022-09-30 DIAGNOSIS — R7303 Prediabetes: Secondary | ICD-10-CM | POA: Diagnosis not present

## 2022-09-30 DIAGNOSIS — E669 Obesity, unspecified: Secondary | ICD-10-CM | POA: Diagnosis not present

## 2022-09-30 MED ORDER — RYBELSUS 3 MG PO TABS: 3.0000 mg | ORAL_TABLET | Freq: Every day | ORAL | 0 refills | Status: DC

## 2022-09-30 MED ORDER — METFORMIN HCL 500 MG PO TABS: 500.0000 mg | ORAL_TABLET | Freq: Every day | ORAL | 0 refills | Status: DC

## 2022-09-30 MED ORDER — VITAMIN D (ERGOCALCIFEROL) 1.25 MG (50000 UNIT) PO CAPS: 50000.0000 [IU] | ORAL_CAPSULE | ORAL | 0 refills | Status: DC

## 2022-09-30 NOTE — Telephone Encounter (Signed)
Prior authorization done for patients Rybelsus. Waiting on determination. Preston Thomas ([facility]) Rx #: [id number]

## 2022-10-02 ENCOUNTER — Ambulatory Visit (INDEPENDENT_AMBULATORY_CARE_PROVIDER_SITE_OTHER): Payer: PPO | Admitting: Primary Care

## 2022-10-02 ENCOUNTER — Encounter: Payer: Self-pay | Admitting: Primary Care

## 2022-10-02 VITALS — BP 130/72 | HR 85 | Temp 98.4°F | Ht 60.0 in | Wt 224.2 lb

## 2022-10-02 DIAGNOSIS — R1310 Dysphagia, unspecified: Secondary | ICD-10-CM | POA: Insufficient documentation

## 2022-10-02 DIAGNOSIS — R1319 Other dysphagia: Secondary | ICD-10-CM | POA: Diagnosis not present

## 2022-10-02 DIAGNOSIS — K219 Gastro-esophageal reflux disease without esophagitis: Secondary | ICD-10-CM | POA: Diagnosis not present

## 2022-10-02 MED ORDER — OMEPRAZOLE 20 MG PO CPDR: 20.0000 mg | DELAYED_RELEASE_CAPSULE | Freq: Two times a day (BID) | ORAL | 0 refills | Status: DC

## 2022-10-02 NOTE — Assessment & Plan Note (Signed)
Isolated incidence recently.  Discussed triggers for GERD. Also discussed side effects from Rybelsus.  Will increase omeprazole to 20 mg BID for now. Discussed to chew food thoroughly prior to swallowing. Handout for GERD provided.   If incident returns then will send to GI, he agrees.

## 2022-10-02 NOTE — Telephone Encounter (Signed)
Last ordered 12/19/21 for 90 tab w/ 3 refills.   Called the pharmacy and verified when patient was picking up refills as he should not be completely out yet. He picked up original prescription 12/19/21 and refills on 01/20/22, 02/19/22, and 06/22/22. It showed he picked up 90 day supplies for each refill. Pharmacist was unsure how he was able to pickup the refills so soon, as insurance should not have covered them. Called patient and verified he is taking the medication correctly, 1 tab daily.  He is unsure why he ran out so soon between each refill. He is completley out of the medication as of now.

## 2022-10-02 NOTE — Assessment & Plan Note (Signed)
Uncontrolled which could be secondary to his dietary changes with healthy weight and wellness.  Continue omeprazole 20 mg in AM, add omeprazole 20 mg HS. Discussed triggers for GERD. He will update.

## 2022-10-02 NOTE — Progress Notes (Signed)
Subjective:    Patient ID: Preston Thomas, male    DOB: 21-May-1951, 72 y.o.   MRN: [id number]  HPI  Preston Thomas is a very pleasant 72 y.o. male  has a past medical history of Acute conjunctivitis of both eyes (02/20/2022), Bilateral lower extremity edema, Cancer ([place]), COLONIC POLYPS, HX OF (08/31/2007), DIVERTICULITIS, HX OF (08/31/2007), DIVERTICULOSIS, COLON (08/31/2007), Dyspnea, Gallbladder problem, GERD (gastroesophageal reflux disease), History of kidney stones, radiation therapy (04/11/2019), Pneumonia due to COVID-19 virus (04/19/2019), RENAL CALCULUS, HX OF (08/31/2007), Shortness of breath (02/21/2020), Sleep apnea, and Swallowing difficulty. who presents today to discuss dysphagia.  He sent a message via the [place] portal on 09/23/22 reporting a sensation of food within his throat for which she cannot cough up.  Given the symptoms he was asked to follow-up.  Symptoms include food getting stuck to the upper esophagus lower throat, could not swallow or cough it up. This occurred while eating grilled chicken on 09/23/22.  History of the same around 2016/2017, had to undergo stretching of his esophagus. He was told that his esophagus was too narrowed secondary to acid reflux.   He's not experienced these symptoms since his throat was stretched several years ago. He does notice these symptoms when taking vitamin D 50, 000 IU. He is compliant to his omeprazole 20 mg daily in the morning which helps control reflux during the day, but he does cough at night, especially after eating.    Review of Systems  HENT:  Positive for trouble swallowing. Negative for congestion.   Respiratory:  Positive for cough.   Gastrointestinal:        Evening cough after evening         Past Medical History:  Diagnosis Date   Acute conjunctivitis of both eyes 02/20/2022   Bilateral lower extremity edema    Cancer ([place])    COLONIC POLYPS, HX OF 08/31/2007   Qualifier:  Diagnosis of  By: Silvio Pate MD, [facility]    DIVERTICULITIS, HX OF 08/31/2007   Qualifier: Diagnosis of  By: Joesph Fillers CMA (AAMA), Natasha     DIVERTICULOSIS, COLON 08/31/2007   Qualifier: Diagnosis of  By: Silvio Pate MD, [facility]    Dyspnea    Related to Covid   Gallbladder problem    GERD (gastroesophageal reflux disease)    History of kidney stones    Hx of radiation therapy 04/11/2019   Pneumonia due to COVID-19 virus 04/19/2019   RENAL CALCULUS, HX OF 08/31/2007   Qualifier: Diagnosis of  By: Joesph Fillers CMA (AAMA), Natasha     Shortness of breath 02/21/2020   Sleep apnea    Swallowing difficulty     Social History   Socioeconomic History   Marital status: Married    Spouse name: Not on file   Number of children: Not on file   Years of education: Not on file   Highest education level: Not on file  Occupational History   Not on file  Tobacco Use   Smoking status: Former    Packs/day: 1.50    Years: 10.00    Total pack years: 15.00    Types: Cigarettes    Quit date: 09/28/1984    Years since quitting: 38.0   Smokeless tobacco: Never  Vaping Use   Vaping Use: Never used  Substance and Sexual Activity   Alcohol use: Not Currently   Drug use: Never   Sexual activity: Yes  Other Topics Concern   Not on file  Social  History Narrative   Not on file   Social Determinants of Health   Financial Resource Strain: Low Risk  (09/14/2022)   Overall Financial Resource Strain (CARDIA)    Difficulty of Paying Living Expenses: Not hard at all  Food Insecurity: No Food Insecurity (09/14/2022)   Hunger Vital Sign    Worried About Running Out of Food in the Last Year: Never true    Ran Out of Food in the Last Year: Never true  Transportation Needs: No Transportation Needs (09/14/2022)   PRAPARE - Hydrologist (Medical): No    Lack of Transportation (Non-Medical): No  Physical Activity: Insufficiently Active (09/14/2022)   Exercise Vital Sign    Days of  Exercise per Week: 2 days    Minutes of Exercise per Session: 60 min  Stress: No Stress Concern Present (09/14/2022)   [place]    Feeling of Stress : Not at all  Social Connections: [place] (09/14/2022)   Social Connection and Isolation Panel [NHANES]    Frequency of Communication with Friends and Family: More than three times a week    Frequency of Social Gatherings with Friends and Family: More than three times a week    Attends Religious Services: More than 4 times per year    Active Member of Clubs or Organizations: Yes    Attends Archivist Meetings: More than 4 times per year    Marital Status: Married  Human resources officer Violence: Not At Risk (09/14/2022)   Humiliation, Afraid, Rape, and Kick questionnaire    Fear of Current or Ex-Partner: No    Emotionally Abused: No    Physically Abused: No    Sexually Abused: No    Past Surgical History:  Procedure Laterality Date   [place] N/A 06/27/2019   Procedure: RADIOACTIVE SEED IMPLANT/BRACHYTHERAPY IMPLANT;  Surgeon: Abbie Sons, MD;  Location: [facility] ORS;  Service: Urology;  Laterality: N/A;    Family History  Problem Relation Age of Onset   Early death Father    Kidney disease Father    Heart failure Sister     Allergies  Allergen Reactions   Penicillins     Passed out Did it involve swelling of the face/tongue/throat, SOB, or low BP? Yes Did it involve sudden or severe rash/hives, skin peeling, or any reaction on the inside of your mouth or nose? No Did you need to seek medical attention at a hospital or doctor's office? No When did it last happen?      30 + years If all above answers are "NO", may proceed with cephalosporin use.     Current Outpatient Medications on File Prior to Visit  Medication Sig Dispense Refill   atorvastatin (LIPITOR) 20 MG tablet Take 1  tablet (20 mg total) by mouth daily. for cholesterol. 90 tablet 3   clobetasol ointment (TEMOVATE) 3.32 % Apply 1 application topically 2 (two) times daily. (Patient taking differently: Apply 1 application  topically 2 (two) times daily. As needed) 30 g 0   fluocinonide (LIDEX) 0.05 % external solution Apply 1 application topically 2 (two) times daily. (Patient taking differently: Apply 1 application  topically 2 (two) times daily. As needed) 60 mL 0   fluticasone (CUTIVATE) 0.05 % cream Apply topically 2 (two) times daily. (Patient taking differently: Apply topically 2 (two) times daily. As needed) 30 g 0  metFORMIN (GLUCOPHAGE) 500 MG tablet Take 1 tablet (500 mg total) by mouth daily. 30 tablet 0   SUMAtriptan (IMITREX) 100 MG tablet TAKE 1 TABLET BY MOUTH AT MIGRAINE ONSET,MAY REPEAT IN 2 HOURS IF HEADACHE PERSISTS/RECURS *DO NOT EXCEED '100MG'$  IN 24 HOURS** 10 tablet 0   tamsulosin (FLOMAX) 0.4 MG CAPS capsule TAKE 1 CAPSULE BY MOUTH ONCE DAILY 90 capsule 2   triamcinolone (KENALOG) 0.025 % cream Apply 1 application topically 2 (two) times daily.     vitamin B-12 (CYANOCOBALAMIN) 500 MCG tablet Take 500 mcg by mouth daily.     vitamin C (ASCORBIC ACID) 250 MG tablet Take 250 mg by mouth daily.     Vitamin D, Ergocalciferol, (DRISDOL) 1.25 MG (50000 UNIT) CAPS capsule Take 1 capsule (50,000 Units total) by mouth every 7 (seven) days. 5 capsule 0   Zinc 100 MG TABS Take by mouth.     Semaglutide (RYBELSUS) 3 MG TABS Take 3 mg by mouth daily. (Patient not taking: Reported on 10/02/2022) 30 tablet 0   No current facility-administered medications on file prior to visit.    BP 130/72   Pulse 85   Temp 98.4 F (36.9 C) (Oral)   Ht 5' (1.524 m)   Wt 224 lb 3.2 oz (101.7 kg)   SpO2 98%   BMI 43.79 kg/m  Objective:   Physical Exam Cardiovascular:     Rate and Rhythm: Normal rate and regular rhythm.  Pulmonary:     Effort: Pulmonary effort is normal.     Breath sounds: Normal breath sounds. No  wheezing or rales.  Musculoskeletal:     Cervical back: Neck supple.  Skin:    General: Skin is warm and dry.  Neurological:     Mental Status: He is alert and oriented to person, place, and time.           Assessment & Plan:  Gastroesophageal reflux disease, unspecified whether esophagitis present Assessment & Plan: Uncontrolled which could be secondary to his dietary changes with healthy weight and wellness.  Continue omeprazole 20 mg in AM, add omeprazole 20 mg HS. Discussed triggers for GERD. He will update.   Orders: -     Omeprazole; Take 1 capsule (20 mg total) by mouth 2 (two) times daily before a meal. For heartburn.  Dispense: 180 capsule; Refill: 0  Esophageal dysphagia Assessment & Plan: Isolated incidence recently.  Discussed triggers for GERD. Also discussed side effects from Rybelsus.  Will increase omeprazole to 20 mg BID for now. Discussed to chew food thoroughly prior to swallowing. Handout for GERD provided.   If incident returns then will send to GI, he agrees.          Pleas [name], NP

## 2022-10-02 NOTE — Patient Instructions (Signed)
We increased your omeprazole to 20 mg twice daily before meals.  Take a look at the triggers for GERD below.  Rybelsus can cause increased heartburn.   It was a pleasure to see you today!

## 2022-10-05 NOTE — Progress Notes (Signed)
Chief Complaint:   OBESITY Preston Thomas is here to discuss his progress with his obesity treatment plan along with follow-up of his obesity related diagnoses. Preston Thomas is on the Category 3 Plan and states he is following his eating plan approximately 97% of the time. Preston Thomas states he is going to the gym and lifting weights 45 minutes 2 times per week.  Today's visit was #: 8 Starting weight: 229 LBS Starting date: 06/10/2022 Today's weight: 219 LBS Today's date: 09/30/2022 Total lbs lost to date: 10 LBS Total lbs lost since last in-office visit: 1 LB  Interim History: Patient completed PT.  Going to the [facility] 2 days/week.  No exercise no other days.  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  He is eating on plan.  He denies hunger or cravings.  He has never used AOM's.  Subjective:   1. Vitamin D deficiency Patient is on prescription vitamin D 50,000 IU weekly.  His energy level is improving.  2. Prediabetes Last A1c 6.2 on 06/10/2022.  Patient is tolerating metformin 500 mg daily.  He denies any adverse side effects.  Fasting insulin 34.2.  Assessment/Plan:   1. Vitamin D deficiency Recheck vitamin D level next visit.  Refill- Vitamin D, Ergocalciferol, (DRISDOL) 1.25 MG (50000 UNIT) CAPS capsule; Take 1 capsule (50,000 Units total) by mouth every 7 (seven) days.  Dispense: 5 capsule; Refill: 0  2. Prediabetes Continue to reduce intake of sugar. Patient denies a personal or family history of pancreatitis, medullary thyroid carcinoma or multiple endocrine neoplasia type II.  Reviewed MOA and potential side effects.  Refill- metFORMIN (GLUCOPHAGE) 500 MG tablet; Take 1 tablet (500 mg total) by mouth daily.  Dispense: 30 tablet; Refill: 0  Begin- Semaglutide (RYBELSUS) 3 MG TABS; Take 3 mg by mouth daily.  Dispense: 30 tablet; Refill: 0  3. Obesity,current BMI 42.8 1.  Increase home gym or cardio to 2 days/week and keep weight training 2 days/week. 2.  Increase sleep  time to 6 to 7 hours per night.  Preston Thomas is currently in the action stage of change. As such, his goal is to continue with weight loss efforts. He has agreed to the Category 3 Plan.   Exercise goals: All adults should avoid inactivity. Some physical activity is better than none, and adults who participate in any amount of physical activity gain some health benefits.  Behavioral modification strategies: increasing lean protein intake, increasing vegetables, increasing water intake, decreasing eating out, no skipping meals, meal planning and cooking strategies, keeping healthy foods in the home, and planning for success.  Preston Thomas has agreed to follow-up with our clinic in 4 weeks. He was informed of the importance of frequent follow-up visits to maximize his success with intensive lifestyle modifications for his multiple health conditions.   Objective:   Blood pressure 128/83, pulse 80, temperature 98.1 F (36.7 C), height 5' (1.524 m), weight 219 lb (99.3 kg), SpO2 95 %. Body mass index is 42.77 kg/m.  General: Cooperative, alert, well developed, in no acute distress. HEENT: Conjunctivae and lids unremarkable. Cardiovascular: Regular rhythm.  Lungs: Normal work of breathing. Neurologic: No focal deficits.   Lab Results  Component Value Date   CREATININE 0.95 06/10/2022   BUN 14 06/10/2022   NA 144 06/10/2022   K 4.5 06/10/2022   CL 104 06/10/2022   CO2 20 06/10/2022   Lab Results  Component Value Date   ALT 29 06/10/2022   AST 17 06/10/2022   ALKPHOS  66 06/10/2022   BILITOT 0.3 06/10/2022   Lab Results  Component Value Date   HGBA1C 6.2 (H) 06/10/2022   HGBA1C 5.8 (A) 12/19/2021   HGBA1C 6.1 (A) 03/17/2021   HGBA1C 6.1 09/02/2020   Lab Results  Component Value Date   INSULIN 34.2 (H) 06/10/2022   Lab Results  Component Value Date   TSH 2.420 06/10/2022   Lab Results  Component Value Date   CHOL 145 06/10/2022   HDL 36 (L) 06/10/2022   LDLCALC 89 06/10/2022    LDLDIRECT 167.0 05/15/2019   TRIG 110 06/10/2022   CHOLHDL 4 12/19/2021   Lab Results  Component Value Date   VD25OH 35.8 06/10/2022   Lab Results  Component Value Date   WBC 7.1 06/10/2022   HGB 14.1 06/10/2022   HCT 42.6 06/10/2022   MCV 91 06/10/2022   PLT 338 06/10/2022   Lab Results  Component Value Date   FERRITIN 986 (H) 04/26/2019    Attestation Statements:   Reviewed by clinician on day of visit: allergies, medications, problem list, medical history, surgical history, family history, social history, and previous encounter notes.  I, Davy Pique, am acting as Location manager for Loyal Gambler, DO.  I have reviewed the above documentation for accuracy and completeness, and I agree with the above. Dell Ponto, DO

## 2022-10-06 ENCOUNTER — Encounter (INDEPENDENT_AMBULATORY_CARE_PROVIDER_SITE_OTHER): Payer: Self-pay

## 2022-10-06 ENCOUNTER — Other Ambulatory Visit: Payer: Self-pay | Admitting: Primary Care

## 2022-10-06 DIAGNOSIS — E785 Hyperlipidemia, unspecified: Secondary | ICD-10-CM

## 2022-10-06 NOTE — Telephone Encounter (Signed)
I am still very unclear how he is out of this medication as a 1 year supply was sent to his pharmacy in March 2023.  Regardless, we will refill for 90-day supply for now.

## 2022-10-06 NOTE — Telephone Encounter (Signed)
Patient called in to follow up on this refill. He stated that he is out of the medication. Pharmacy is stating they authorization to refill. Thank you!

## 2022-10-20 DIAGNOSIS — H1033 Unspecified acute conjunctivitis, bilateral: Secondary | ICD-10-CM | POA: Diagnosis not present

## 2022-10-27 DIAGNOSIS — H1033 Unspecified acute conjunctivitis, bilateral: Secondary | ICD-10-CM | POA: Diagnosis not present

## 2022-10-29 ENCOUNTER — Encounter (INDEPENDENT_AMBULATORY_CARE_PROVIDER_SITE_OTHER): Payer: Self-pay | Admitting: Family Medicine

## 2022-10-29 ENCOUNTER — Ambulatory Visit (INDEPENDENT_AMBULATORY_CARE_PROVIDER_SITE_OTHER): Payer: PPO | Admitting: Family Medicine

## 2022-10-29 VITALS — BP 128/82 | HR 54 | Temp 97.6°F | Ht 60.0 in | Wt 217.0 lb

## 2022-10-29 DIAGNOSIS — E669 Obesity, unspecified: Secondary | ICD-10-CM | POA: Diagnosis not present

## 2022-10-29 DIAGNOSIS — E559 Vitamin D deficiency, unspecified: Secondary | ICD-10-CM

## 2022-10-29 DIAGNOSIS — Z6841 Body Mass Index (BMI) 40.0 and over, adult: Secondary | ICD-10-CM

## 2022-10-29 DIAGNOSIS — R7303 Prediabetes: Secondary | ICD-10-CM | POA: Diagnosis not present

## 2022-10-29 DIAGNOSIS — E88819 Insulin resistance, unspecified: Secondary | ICD-10-CM

## 2022-10-30 LAB — COMPREHENSIVE METABOLIC PANEL
ALT: 53 IU/L — ABNORMAL HIGH (ref 0–44)
AST: 24 IU/L (ref 0–40)
Albumin/Globulin Ratio: 1.8 (ref 1.2–2.2)
Albumin: 4.4 g/dL (ref 3.9–4.9)
Alkaline Phosphatase: 77 IU/L (ref 44–121)
BUN/Creatinine Ratio: 17 (ref 10–24)
BUN: 18 mg/dL (ref 8–27)
Bilirubin Total: 0.2 mg/dL (ref 0.0–1.2)
CO2: 21 mmol/L (ref 20–29)
Calcium: 9.5 mg/dL (ref 8.6–10.2)
Chloride: 106 mmol/L (ref 96–106)
Creatinine, Ser: 1.05 mg/dL (ref 0.76–1.27)
Globulin, Total: 2.5 g/dL (ref 1.5–4.5)
Glucose: 100 mg/dL — ABNORMAL HIGH (ref 70–99)
Potassium: 4.8 mmol/L (ref 3.5–5.2)
Sodium: 142 mmol/L (ref 134–144)
Total Protein: 6.9 g/dL (ref 6.0–8.5)
eGFR: 77 mL/min/{1.73_m2} (ref >59)

## 2022-10-30 LAB — VITAMIN D 25 HYDROXY (VIT D DEFICIENCY, FRACTURES): Vit D, 25-Hydroxy: 50.7 ng/mL (ref 30.0–100.0)

## 2022-10-30 LAB — VITAMIN B12: Vitamin B-12: 728 pg/mL (ref 232–1245)

## 2022-10-30 LAB — HEMOGLOBIN A1C
Est. average glucose Bld gHb Est-mCnc: 120 mg/dL
Hgb A1c MFr Bld: 5.8 % — ABNORMAL HIGH (ref 4.8–5.6)

## 2022-10-31 ENCOUNTER — Encounter (INDEPENDENT_AMBULATORY_CARE_PROVIDER_SITE_OTHER): Payer: Self-pay

## 2022-10-31 DIAGNOSIS — R7303 Prediabetes: Secondary | ICD-10-CM

## 2022-11-03 DIAGNOSIS — H1033 Unspecified acute conjunctivitis, bilateral: Secondary | ICD-10-CM | POA: Diagnosis not present

## 2022-11-03 MED ORDER — METFORMIN HCL 500 MG PO TABS: 500.0000 mg | ORAL_TABLET | Freq: Every day | ORAL | 0 refills | Status: DC

## 2022-11-12 DIAGNOSIS — G4733 Obstructive sleep apnea (adult) (pediatric): Secondary | ICD-10-CM | POA: Diagnosis not present

## 2022-11-21 NOTE — Progress Notes (Unsigned)
Chief Complaint:   OBESITY Preston Thomas is here to discuss his progress with his obesity treatment plan along with follow-up of his obesity related diagnoses. Preston Thomas is on the Category 3 Plan and states he is following his eating plan approximately 98% of the time. Preston Thomas states he is walking and weights 30-6 minutes 2-3 times per week.  Today's visit was #: 9 Starting weight: 229 lbs Starting date: 06/10/2022 Today's weight: 217 lbs Today's date: 10/29/2022 Total lbs lost to date: 12 Total lbs lost since last in-office visit: 2  Interim History: Preston Thomas is sticking to meal plan and picking healthy snacks. He feels adequately full. Denies meal skipping and is getting more water. Pt feels stronger and has better energy since working out at [facility]. He gained 5 lbs of muscle and lost 6 lbs of body fat.  Subjective:   1. Prediabetes Insurance denies Rybelsus. Pt is taking Metformin 500 mg once a day and denies GI upset. Polyphagia improved.  2. Vitamin D deficiency Energy level improving. 06/10/2022 Vitamin D level 35.8. Preston Thomas is taking prescription Vitamin D 50,000 IU weekly.  3. Insulin resistance 9/13 fasting insulin 34.2. Pt is doing great with prescribed meal plan, increased exercise, and weight loss.  Saw a 2 point drop in visceral fat rating.  Assessment/Plan:   1. Prediabetes Refill Metformin 500 mg daily.  Lab/Orders today: - Hemoglobin A1c - Comprehensive metabolic panel - Vitamin 123456  2. Vitamin D deficiency Refill Vitamin D 50,000 IU weekly.  Lab/Orders today: - VITAMIN D 25 Hydroxy (Vit-D Deficiency, Fractures)  3. Insulin resistance Continue Metformin and lower carb/low sugar diet.  4. Obesity,current BMI 42.4 Preston Thomas is currently in the action stage of change. As such, his goal is to continue with weight loss efforts. He has agreed to the Category 3 Plan.   Exercise goals:  As is - Plan to increase walking time this Spring.  Behavioral  modification strategies: increasing lean protein intake, increasing water intake, meal planning and cooking strategies, keeping healthy foods in the home, better snacking choices, and planning for success.  Preston Thomas has agreed to follow-up with our clinic in 4 weeks. He was informed of the importance of frequent follow-up visits to maximize his success with intensive lifestyle modifications for his multiple health conditions.   Objective:   Blood pressure 128/82, pulse (!) 54, temperature 97.6 F (36.4 C), height 5' (1.524 m), weight 217 lb (98.4 kg), SpO2 96 %. Body mass index is 42.38 kg/m.  General: Cooperative, alert, well developed, in no acute distress. HEENT: Conjunctivae and lids unremarkable. Cardiovascular: Regular rhythm.  Lungs: Normal work of breathing. Neurologic: No focal deficits.   Lab Results  Component Value Date   CREATININE 1.05 10/29/2022   BUN 18 10/29/2022   NA 142 10/29/2022   K 4.8 10/29/2022   CL 106 10/29/2022   CO2 21 10/29/2022   Lab Results  Component Value Date   ALT 53 (H) 10/29/2022   AST 24 10/29/2022   ALKPHOS 77 10/29/2022   BILITOT 0.2 10/29/2022   Lab Results  Component Value Date   HGBA1C 5.8 (H) 10/29/2022   HGBA1C 6.2 (H) 06/10/2022   HGBA1C 5.8 (A) 12/19/2021   HGBA1C 6.1 (A) 03/17/2021   HGBA1C 6.1 09/02/2020   Lab Results  Component Value Date   INSULIN 34.2 (H) 06/10/2022   Lab Results  Component Value Date   TSH 2.420 06/10/2022   Lab Results  Component Value Date   CHOL 145 06/10/2022  HDL 36 (L) 06/10/2022   LDLCALC 89 06/10/2022   LDLDIRECT 167.0 05/15/2019   TRIG 110 06/10/2022   CHOLHDL 4 12/19/2021   Lab Results  Component Value Date   VD25OH 50.7 10/29/2022   VD25OH 35.8 06/10/2022   Lab Results  Component Value Date   WBC 7.1 06/10/2022   HGB 14.1 06/10/2022   HCT 42.6 06/10/2022   MCV 91 06/10/2022   PLT 338 06/10/2022   Lab Results  Component Value Date   FERRITIN 986 (H) 04/26/2019    Attestation Statements:   Reviewed by clinician on day of visit: allergies, medications, problem list, medical history, surgical history, family history, social history, and previous encounter notes.  I, Kathlene November, BS, CMA, am acting as transcriptionist for Loyal Gambler, DO.   I have reviewed the above documentation for accuracy and completeness, and I agree with the above. -  ***

## 2022-11-22 ENCOUNTER — Telehealth: Payer: PPO | Admitting: Nurse Practitioner

## 2022-11-22 DIAGNOSIS — U071 COVID-19: Secondary | ICD-10-CM | POA: Diagnosis not present

## 2022-11-22 MED ORDER — NIRMATRELVIR/RITONAVIR (PAXLOVID)TABLET: 3.0000 | ORAL_TABLET | Freq: Two times a day (BID) | ORAL | 0 refills | Status: AC

## 2022-11-22 NOTE — Progress Notes (Signed)
Virtual Visit Consent   Preston Thomas, you are scheduled for a virtual visit with a [facility] provider today. Just as with appointments in the office, your consent must be obtained to participate. Your consent will be active for this visit and any virtual visit you may have with one of our providers in the next 365 days. If you have a MyChart account, a copy of this consent can be sent to you electronically.  As this is a virtual visit, video technology does not allow for your provider to perform a traditional examination. This may limit your provider's ability to fully assess your condition. If your provider identifies any concerns that need to be evaluated in person or the need to arrange testing (such as labs, EKG, etc.), we will make arrangements to do so. Although advances in technology are sophisticated, we cannot ensure that it will always work on either your end or our end. If the connection with a video visit is poor, the visit may have to be switched to a telephone visit. With either a video or telephone visit, we are not always able to ensure that we have a secure connection.  By engaging in this virtual visit, you consent to the provision of healthcare and authorize for your insurance to be billed (if applicable) for the services provided during this visit. Depending on your insurance coverage, you may receive a charge related to this service.  I need to obtain your verbal consent now. Are you willing to proceed with your visit today?  Yes. Preston Thomas has provided verbal consent on 11/22/2022 for a virtual visit (video or telephone). Tish Men, NP  Date: 11/22/2022 10:17 AM  Virtual Visit via Video Note   I, [place], connected with  Preston Thomas  ([id number], 1951-07-04) on 11/22/22 at 10:15 AM EST by a video-enabled telemedicine application and verified that I am speaking with the correct person using two  identifiers.  Location: Patient: Virtual Visit Location Patient: Home Provider: Virtual Visit Location Provider: Home   I discussed the limitations of evaluation and management by telemedicine and the availability of in person appointments. The patient expressed understanding and agreed to proceed.    History of Present Illness: Preston Thomas is a 72 y.o. who identifies as a male who was assigned male at birth, and is being seen today for positive self home administered covid test. He presents for fever of 101, nasal congestion, headache, and cough. He denies chills, ear pain, wheezing, SOB, difficulty breathing, GI symptoms. He reports he took Nyquil for his symptoms. He has been vaccinated for COVID.  HPI: HPI  Problems:  Patient Active Problem List   Diagnosis Date Noted   Dysphagia 10/02/2022   Visceral obesity 09/03/2022   Insulin resistance 08/18/2022   Eye problem 07/17/2022   Low vitamin B12 level 06/24/2022   Fatigue 06/10/2022   Vitamin D deficiency 06/10/2022   Depression screening 06/10/2022   Class 3 severe obesity with serious comorbidity and body mass index (BMI) of 40.0 to 44.9 in adult [facility]) 06/10/2022   Hypertension 05/29/2022   Class 3 severe obesity with serious comorbidity and body mass index (BMI) of 45.0 to 49.9 in adult [facility]) 05/29/2022   Syncope, near 03/27/2022   Abnormal EKG 03/27/2022   DOE (dyspnea on exertion) 10/06/2021   Psoriasis 06/26/2021   Prediabetes 09/12/2020   OSA (obstructive sleep apnea) 02/21/2020   Peripheral edema 09/06/2019   History of prostate  cancer 04/11/2019   GERD (gastroesophageal reflux disease) 09/13/2018   Preventative health care 08/17/2018   Hyperlipidemia 08/31/2007   Migraines 08/31/2007    Allergies:  Allergies  Allergen Reactions   Penicillins     Passed out Did it involve swelling of the face/tongue/throat, SOB, or low BP? Yes Did it involve sudden or severe rash/hives, skin peeling, or any reaction  on the inside of your mouth or nose? No Did you need to seek medical attention at a hospital or doctor's office? No When did it last happen?      30 + years If all above answers are "NO", may proceed with cephalosporin use.    Medications:  Current Outpatient Medications:    atorvastatin (LIPITOR) 20 MG tablet, TAKE 1 TABLET BY MOUTH ONCE A DAY FOR CHOLESTEROL., Disp: 90 tablet, Rfl: 0   clobetasol ointment (TEMOVATE) [id number] %, Apply 1 application topically 2 (two) times daily. (Patient taking differently: Apply 1 application  topically 2 (two) times daily. As needed), Disp: 30 g, Rfl: 0   fluocinonide (LIDEX) 0.05 % external solution, Apply 1 application topically 2 (two) times daily. (Patient taking differently: Apply 1 application  topically 2 (two) times daily. As needed), Disp: 60 mL, Rfl: 0   fluticasone (CUTIVATE) 0.05 % cream, Apply topically 2 (two) times daily. (Patient taking differently: Apply topically 2 (two) times daily. As needed), Disp: 30 g, Rfl: 0   metFORMIN (GLUCOPHAGE) 500 MG tablet, Take 1 tablet (500 mg total) by mouth daily., Disp: 30 tablet, Rfl: 0   omeprazole (PRILOSEC) 20 MG capsule, Take 1 capsule (20 mg total) by mouth 2 (two) times daily before a meal. For heartburn., Disp: 180 capsule, Rfl: 0   Semaglutide (RYBELSUS) 3 MG TABS, Take 3 mg by mouth daily., Disp: 30 tablet, Rfl: 0   SUMAtriptan (IMITREX) 100 MG tablet, TAKE 1 TABLET BY MOUTH AT MIGRAINE ONSET,MAY REPEAT IN 2 HOURS IF HEADACHE PERSISTS/RECURS *DO NOT EXCEED '100MG'$  IN 24 HOURS**, Disp: 10 tablet, Rfl: 0   tamsulosin (FLOMAX) 0.4 MG CAPS capsule, TAKE 1 CAPSULE BY MOUTH ONCE DAILY, Disp: 90 capsule, Rfl: 2   triamcinolone (KENALOG) 0.025 % cream, Apply 1 application topically 2 (two) times daily., Disp: , Rfl:    vitamin B-12 (CYANOCOBALAMIN) 500 MCG tablet, Take 500 mcg by mouth daily., Disp: , Rfl:    vitamin C (ASCORBIC ACID) 250 MG tablet, Take 250 mg by mouth daily., Disp: , Rfl:    Vitamin D,  Ergocalciferol, (DRISDOL) 1.25 MG (50000 UNIT) CAPS capsule, Take 1 capsule (50,000 Units total) by mouth every 7 (seven) days., Disp: 5 capsule, Rfl: 0   Zinc 100 MG TABS, Take by mouth., Disp: , Rfl:   Observations/Objective: Patient is well-developed, well-nourished in no acute distress.  Resting comfortably at home.  Head is normocephalic, atraumatic.  No labored breathing.  Speech is clear and coherent with logical content.  Patient is alert and oriented at baseline.   Assessment and Plan: 1. Positive self-administered antigen test for COVID-19 - nirmatrelvir/ritonavir (PAXLOVID) 20 x 150 MG & 10 x '100MG'$  TABS; Take 3 tablets by mouth 2 (two) times daily for 5 days. (Take nirmatrelvir 150 mg two tablets twice daily for 5 days and ritonavir 100 mg one tablet twice daily for 5 days) Patient GFR is 1.05.  Dispense: 30 tablet; Refill: 0  Self administered home covid test was positive today, onset of symptoms x 1 day ago. Paxlovid was prescribed for therapy. Discussed isolation precautions with the patient and  instructed patient to hold Lipitor for 2 weeks when taking Paxlovid. Patient advised to remain in isolation while on medication and if symptoms persist after medication,wear mask for an additional 5 days. Discussed strict ER precautions with the patient. Patient is in agreement with plan of care and verbalizes understanding,  all questions were answered.   Follow Up Instructions: I discussed the assessment and treatment plan with the patient. The patient was provided an opportunity to ask questions and all were answered. The patient agreed with the plan and demonstrated an understanding of the instructions.  A copy of instructions were sent to the patient via MyChart unless otherwise noted below.    The patient was advised to call back or seek an in-person evaluation if the symptoms worsen or if the condition fails to improve as anticipated.  Time:  I spent 10 minutes with the patient  via telehealth technology discussing the above problems/concerns.    Tish Men, NP

## 2022-11-22 NOTE — Patient Instructions (Signed)
Preston Thomas, thank you for joining Tish Men, NP for today's virtual visit.  While this provider is not your primary care provider (PCP), if your PCP is located in our provider database this encounter information will be shared with them immediately following your visit.   [facility] account gives you access to today's visit and all your visits, tests, and labs performed at [facility] " click here if you don't have a [facility] account or go to [email or web address]  Consent: (Patient) Preston Thomas provided verbal consent for this virtual visit at the beginning of the encounter.  Current Medications:  Current Outpatient Medications:    nirmatrelvir/ritonavir (PAXLOVID) 20 x 150 MG & 10 x '100MG'$  TABS, Take 3 tablets by mouth 2 (two) times daily for 5 days. (Take nirmatrelvir 150 mg two tablets twice daily for 5 days and ritonavir 100 mg one tablet twice daily for 5 days) Patient GFR is 1.05., Disp: 30 tablet, Rfl: 0   atorvastatin (LIPITOR) 20 MG tablet, TAKE 1 TABLET BY MOUTH ONCE A DAY FOR CHOLESTEROL., Disp: 90 tablet, Rfl: 0   clobetasol ointment (TEMOVATE) [id number] %, Apply 1 application topically 2 (two) times daily. (Patient taking differently: Apply 1 application  topically 2 (two) times daily. As needed), Disp: 30 g, Rfl: 0   fluocinonide (LIDEX) 0.05 % external solution, Apply 1 application topically 2 (two) times daily. (Patient taking differently: Apply 1 application  topically 2 (two) times daily. As needed), Disp: 60 mL, Rfl: 0   fluticasone (CUTIVATE) 0.05 % cream, Apply topically 2 (two) times daily. (Patient taking differently: Apply topically 2 (two) times daily. As needed), Disp: 30 g, Rfl: 0   metFORMIN (GLUCOPHAGE) 500 MG tablet, Take 1 tablet (500 mg total) by mouth daily., Disp: 30 tablet, Rfl: 0   omeprazole (PRILOSEC) 20 MG capsule, Take 1 capsule (20 mg total) by mouth 2 (two) times daily before a meal.  For heartburn., Disp: 180 capsule, Rfl: 0   Semaglutide (RYBELSUS) 3 MG TABS, Take 3 mg by mouth daily., Disp: 30 tablet, Rfl: 0   SUMAtriptan (IMITREX) 100 MG tablet, TAKE 1 TABLET BY MOUTH AT MIGRAINE ONSET,MAY REPEAT IN 2 HOURS IF HEADACHE PERSISTS/RECURS *DO NOT EXCEED '100MG'$  IN 24 HOURS**, Disp: 10 tablet, Rfl: 0   tamsulosin (FLOMAX) 0.4 MG CAPS capsule, TAKE 1 CAPSULE BY MOUTH ONCE DAILY, Disp: 90 capsule, Rfl: 2   triamcinolone (KENALOG) 0.025 % cream, Apply 1 application topically 2 (two) times daily., Disp: , Rfl:    vitamin B-12 (CYANOCOBALAMIN) 500 MCG tablet, Take 500 mcg by mouth daily., Disp: , Rfl:    vitamin C (ASCORBIC ACID) 250 MG tablet, Take 250 mg by mouth daily., Disp: , Rfl:    Vitamin D, Ergocalciferol, (DRISDOL) 1.25 MG (50000 UNIT) CAPS capsule, Take 1 capsule (50,000 Units total) by mouth every 7 (seven) days., Disp: 5 capsule, Rfl: 0   Zinc 100 MG TABS, Take by mouth., Disp: , Rfl:    Medications ordered in this encounter:  Meds ordered this encounter  Medications   nirmatrelvir/ritonavir (PAXLOVID) 20 x 150 MG & 10 x '100MG'$  TABS    Sig: Take 3 tablets by mouth 2 (two) times daily for 5 days. (Take nirmatrelvir 150 mg two tablets twice daily for 5 days and ritonavir 100 mg one tablet twice daily for 5 days) Patient GFR is 1.05.    Dispense:  30 tablet    Refill:  0     *If  you need refills on other medications prior to your next appointment, please contact your pharmacy*  Follow-Up: Call back or seek an in-person evaluation if the symptoms worsen or if the condition fails to improve as anticipated.  [place] [phone]  Other Instructions Take medication as prescribed. Hold Atorvastatin for 2 weeks. May resume on or around 12/04/22. Increase fluids and allow for plenty of rest. May take over-the-counter Tylenol for pain, fever or general discomfort. Use a humidifier during sleep for cough. Go to the ER if you develop shortness of breath,  difficulty breathing or other concerns.    If you have been instructed to have an in-person evaluation today at a local Urgent Care facility, please use the link below. It will take you to a list of all of our available [facility], including address, phone number and hours of operation. Please do not delay care.  [facility]  If you or a family member do not have a primary care provider, use the link below to schedule a visit and establish care. When you choose a [facility] primary care physician or advanced practice provider, you gain a long-term partner in health. Find a Primary Care Provider  Learn more about [facility]'s in-office and virtual care options: [facility]

## 2022-11-27 ENCOUNTER — Other Ambulatory Visit: Payer: Self-pay | Admitting: *Deleted

## 2022-11-27 DIAGNOSIS — C61 Malignant neoplasm of prostate: Secondary | ICD-10-CM

## 2022-11-30 ENCOUNTER — Other Ambulatory Visit: Payer: PPO

## 2022-11-30 ENCOUNTER — Ambulatory Visit (INDEPENDENT_AMBULATORY_CARE_PROVIDER_SITE_OTHER): Payer: PPO | Admitting: Family Medicine

## 2022-11-30 DIAGNOSIS — C61 Malignant neoplasm of prostate: Secondary | ICD-10-CM

## 2022-12-01 LAB — PSA: Prostate Specific Ag, Serum: 0.4 ng/mL (ref 0.0–4.0)

## 2022-12-02 ENCOUNTER — Ambulatory Visit (INDEPENDENT_AMBULATORY_CARE_PROVIDER_SITE_OTHER): Payer: PPO | Admitting: Physician Assistant

## 2022-12-02 ENCOUNTER — Encounter (INDEPENDENT_AMBULATORY_CARE_PROVIDER_SITE_OTHER): Payer: Self-pay | Admitting: Physician Assistant

## 2022-12-02 VITALS — BP 109/69 | HR 72 | Temp 97.8°F | Ht 60.0 in | Wt 218.0 lb

## 2022-12-02 DIAGNOSIS — U071 COVID-19: Secondary | ICD-10-CM | POA: Diagnosis not present

## 2022-12-02 DIAGNOSIS — R7303 Prediabetes: Secondary | ICD-10-CM | POA: Diagnosis not present

## 2022-12-02 DIAGNOSIS — E559 Vitamin D deficiency, unspecified: Secondary | ICD-10-CM | POA: Diagnosis not present

## 2022-12-02 DIAGNOSIS — Z6841 Body Mass Index (BMI) 40.0 and over, adult: Secondary | ICD-10-CM

## 2022-12-02 MED ORDER — METFORMIN HCL 500 MG PO TABS: 500.0000 mg | ORAL_TABLET | Freq: Every day | ORAL | 0 refills | Status: DC

## 2022-12-02 MED ORDER — VITAMIN D (ERGOCALCIFEROL) 1.25 MG (50000 UNIT) PO CAPS: 50000.0000 [IU] | ORAL_CAPSULE | ORAL | 0 refills | Status: DC

## 2022-12-02 NOTE — Progress Notes (Signed)
Office: [phone]  /  Fax: [phone]  WEIGHT SUMMARY AND BIOMETRICS  Vitals Temp: 97.8 F (36.6 C) BP: 109/69 Pulse Rate: 72 SpO2: 94 %   Anthropometric Measurements Height: 5' (1.524 m) Weight: 218 lb (98.9 kg) BMI (Calculated): 42.58 Weight at Last Visit: 217 lb Weight Lost Since Last Visit: 0 lb Starting Weight: 230 lb   Body Composition  Body Fat %: 39.5 % Fat Mass (lbs): 86.2 lbs Muscle Mass (lbs): 125.2 lbs Total Body Water (lbs): 103.2 lbs Visceral Fat Rating : 28   Other Clinical Data Fasting: no Labs: no Today's Visit #: 10 Starting Date: 05/29/22     HPI  Chief Complaint: OBESITY  Preston Thomas is here to discuss his progress with his obesity treatment plan. He is on the the Category 3 Plan and states he is following his eating plan approximately 98 % of the time. He states he is exercising weight resistance 45 minutes 2 times per week.   Interval History:  Since last office visit he had Covid again. He tested positive on 11/21/22 at home and started on Paxlovid and has recovered well.  He has been following his nutrition plan closely.  He was not able to exercise while sick with Covid, but resumed walking and pulled a muscle in his leg walking on the treadmill. He is going to try to start more slowly with his exercise when able.  Stress level is low. Hunger/appetite are controlled on plan.  Pharmacotherapy: Metformin for primary indication of prediabetes.   PHYSICAL EXAM:  Blood pressure 109/69, pulse 72, temperature 97.8 F (36.6 C), height 5' (1.524 m), weight 218 lb (98.9 kg), SpO2 94 %. Body mass index is 42.58 kg/m.  General: He is overweight, cooperative, alert, well developed, and in no acute distress. PSYCH: Has normal mood, affect and thought process.   Lungs: Normal breathing effort, no conversational dyspnea.  DIAGNOSTIC DATA REVIEWED:  BMET    Component Value Date/Time   NA 142 10/29/2022 1506   K 4.8 10/29/2022 1506    CL 106 10/29/2022 1506   CO2 21 10/29/2022 1506   GLUCOSE 100 (H) 10/29/2022 1506   GLUCOSE 95 03/27/2022 1522   BUN 18 10/29/2022 1506   CREATININE 1.05 10/29/2022 1506   CREATININE 1.14 03/27/2022 1522   CALCIUM 9.5 10/29/2022 1506   GFRNONAA >60 05/17/2019 0906   GFRAA >60 05/17/2019 0906   Lab Results  Component Value Date   HGBA1C 5.8 (H) 10/29/2022   HGBA1C 6.1 09/02/2020   Lab Results  Component Value Date   INSULIN 34.2 (H) 06/10/2022   Lab Results  Component Value Date   TSH 2.420 06/10/2022   CBC    Component Value Date/Time   WBC 7.1 06/10/2022 1213   WBC 8.2 03/27/2022 1522   RBC 4.70 06/10/2022 1213   RBC 4.81 03/27/2022 1522   HGB 14.1 06/10/2022 1213   HCT 42.6 06/10/2022 1213   PLT 338 06/10/2022 1213   MCV 91 06/10/2022 1213   MCH 30.0 06/10/2022 1213   MCH 29.5 03/27/2022 1522   MCHC 33.1 06/10/2022 1213   MCHC 32.6 03/27/2022 1522   RDW 12.9 06/10/2022 1213   Iron Studies    Component Value Date/Time   FERRITIN 986 (H) 04/26/2019 0205   Lipid Panel     Component Value Date/Time   CHOL 145 06/10/2022 1213   TRIG 110 06/10/2022 1213   HDL 36 (L) 06/10/2022 1213   CHOLHDL 4 12/19/2021 1246   VLDL 31.8 12/19/2021  1246   LDLCALC 89 06/10/2022 1213   LDLDIRECT 167.0 05/15/2019 1100   Hepatic Function Panel     Component Value Date/Time   PROT 6.9 10/29/2022 1506   ALBUMIN 4.4 10/29/2022 1506   AST 24 10/29/2022 1506   ALT 53 (H) 10/29/2022 1506   ALKPHOS 77 10/29/2022 1506   BILITOT 0.2 10/29/2022 1506      Component Value Date/Time   TSH 2.420 06/10/2022 1213   Nutritional Lab Results  Component Value Date   VD25OH 50.7 10/29/2022   VD25OH 35.8 06/10/2022     ASSESSMENT AND PLAN  1. Prediabetes Prediabetes Last A1c was 5.8  Medication(s): metformin. No GI side effects.  Metformin 500 mg once daily with meals Lab Results  Component Value Date   HGBA1C 5.8 (H) 10/29/2022   HGBA1C 6.2 (H) 06/10/2022   HGBA1C 5.8 (A)  12/19/2021   HGBA1C 6.1 (A) 03/17/2021   HGBA1C 6.1 09/02/2020   Lab Results  Component Value Date   INSULIN 34.2 (H) 06/10/2022    Plan: Continue working on nutrition plan to decrease simple carbohydrates, increase lean proteins and exercise to promote weight loss, improve glycemic control and prevent progression to Type 2 diabetes.   Continue and refill Metformin 500 mg once daily with meals   2. Vitamin D deficiency Vitamin D Deficiency Vitamin D is at goal of 50.  Most recent vitamin D level was 50.7. He is on  prescription ergocalciferol 50,000 IU weekly. Lab Results  Component Value Date   VD25OH 50.7 10/29/2022   VD25OH 35.8 06/10/2022    Plan: Refill prescription vitamin D 50,000 IU weekly. Plan to recheck level at least 2-3 times yearly to avoid over supplementation.    3. COVID-19 virus infection, Treated with Paxlovid Covid infection and treated with Paxlovid and now resolved and recovering well.  Monitor following Covid infection.  4. Obesity ([place])- start BMI 44.92 5. BMI 40.0-44.9, adult ([place])  TREATMENT PLAN FOR OBESITY:  Recommended Dietary Goals  Preston Thomas is currently in the action stage of change. As such, his goal is to continue weight management plan. He has agreed to the Category 3 Plan.  Behavioral Intervention  We discussed the following Behavioral Modification Strategies today: increasing lean protein intake, decreasing simple carbohydrates , increasing water intake, and work on meal planning and easy cooking plans.  Additional resources provided today: NA  Recommended Physical Activity Goals  Preston Thomas has been advised to work up to 150 minutes of moderate intensity aerobic activity a week and strengthening exercises 2-3 times per week for cardiovascular health, weight loss maintenance and preservation of muscle mass. He will resume once his pulled muscle improves and start slowly.   He has agreed to increase physical activity in their day  and reduce sedentary time (increase NEAT).  and continue physical activity as is.    Pharmacotherapy We discussed various medication options to help Preston Thomas with his weight loss efforts and we both agreed to continue metformin for primary indication of prediabetes.  ASSOCIATED CONDITIONS ADDRESSED TODAY     Return in about 3 weeks (around 12/23/2022).[name] He was informed of the importance of frequent follow up visits to maximize his success with intensive lifestyle modifications for his multiple health conditions.   ATTESTASTION STATEMENTS:  Reviewed by clinician on day of visit: allergies, medications, problem list, medical history, surgical history, family history, social history, and previous encounter notes.   I have personally spent 32 minutes total time today in preparation, patient care, nutritional counseling and documentation  for this visit, including the following: review of clinical lab tests; review of medical tests/procedures/services.      Anneth Brunell, PA-C

## 2022-12-04 ENCOUNTER — Ambulatory Visit: Payer: PPO | Admitting: Urology

## 2022-12-07 ENCOUNTER — Other Ambulatory Visit: Payer: Self-pay | Admitting: Primary Care

## 2022-12-07 DIAGNOSIS — E785 Hyperlipidemia, unspecified: Secondary | ICD-10-CM

## 2022-12-07 NOTE — Telephone Encounter (Signed)
Patient is due for CPE/follow up in late March, this will be required prior to any further refills.  Please schedule, thank you!   

## 2022-12-07 NOTE — Telephone Encounter (Signed)
From: Clair Gulling III To: Office of Pleas [name], NP Sent: 12/07/2022 10:02 AM EDT Subject: Medication Renewal Request  Refills have been requested for the following medications:   atorvastatin (LIPITOR) 20 MG tablet [Caniyah Murley K Kiely Cousar]  Preferred pharmacy: [facility], [place] Delivery method: [employer]

## 2022-12-08 NOTE — Telephone Encounter (Signed)
Lvmtcb, sent mychart message  

## 2022-12-10 ENCOUNTER — Encounter: Payer: Self-pay | Admitting: Urology

## 2022-12-10 ENCOUNTER — Ambulatory Visit: Payer: PPO | Admitting: Urology

## 2022-12-10 VITALS — BP 132/78 | HR 80 | Ht 59.0 in | Wt 218.0 lb

## 2022-12-10 DIAGNOSIS — C61 Malignant neoplasm of prostate: Secondary | ICD-10-CM | POA: Diagnosis not present

## 2022-12-10 MED ORDER — TAMSULOSIN HCL 0.4 MG PO CAPS: 0.4000 mg | ORAL_CAPSULE | Freq: Every day | ORAL | 2 refills | Status: DC

## 2022-12-10 NOTE — Progress Notes (Signed)
12/10/2022 10:16 AM   Preston Thomas 05-12-1951 [id number]  Referring provider: Pleas [name], NP [place],  [place] [address]  Chief Complaint  Patient presents with   Prostate Cancer     Urologic history: 1. cT1c prostate cancer-high risk -Biopsy 10/2018, PSA 12.7, 28 g gland -10/12 cores positive Gleason 3+3 to 4+5 -CT/bone scan negative for metastatic disease -tx IMRT + brachytherapy + ADT; brachytherapy 05/2019   HPI: 72 y.o. male presents for prostate cancer follow-up.  Doing well since last visit PSA 11/30/2022 0.4 He states the effects of his leuprolide have resolved and he is lost 15 pounds and exercising.  Final leuprolide injection 02/10/2021 On tamsulosin and voiding with good stream Denies dysuria, gross hematuria No flank, abdominal, pelvic pain   PMH: Past Medical History:  Diagnosis Date   Acute conjunctivitis of both eyes 02/20/2022   Bilateral lower extremity edema    Cancer ([place])    COLONIC POLYPS, HX OF 08/31/2007   Qualifier: Diagnosis of  By: Silvio Pate MD, [facility]    DIVERTICULITIS, HX OF 08/31/2007   Qualifier: Diagnosis of  By: Joesph Fillers CMA (AAMA), Natasha     DIVERTICULOSIS, COLON 08/31/2007   Qualifier: Diagnosis of  By: Silvio Pate MD, [facility]    Dyspnea    Related to Covid   Gallbladder problem    GERD (gastroesophageal reflux disease)    History of kidney stones    Hx of radiation therapy 04/11/2019   Pneumonia due to COVID-19 virus 04/19/2019   RENAL CALCULUS, HX OF 08/31/2007   Qualifier: Diagnosis of  By: Joesph Fillers CMA (AAMA), Natasha     Shortness of breath 02/21/2020   Sleep apnea    Swallowing difficulty     Surgical History: Past Surgical History:  Procedure Laterality Date   [place]   RADIOACTIVE SEED IMPLANT N/A 06/27/2019   Procedure: RADIOACTIVE SEED IMPLANT/BRACHYTHERAPY IMPLANT;  Surgeon: Abbie Sons, MD;  Location: [facility] ORS;  Service: Urology;   Laterality: N/A;    Home Medications:  Allergies as of 12/10/2022       Reactions   Penicillins    Passed out Did it involve swelling of the face/tongue/throat, SOB, or low BP? Yes Did it involve sudden or severe rash/hives, skin peeling, or any reaction on the inside of your mouth or nose? No Did you need to seek medical attention at a hospital or doctor's office? No When did it last happen?      30 + years If all above answers are "NO", may proceed with cephalosporin use.        Medication List        Accurate as of December 10, 2022 10:16 AM. If you have any questions, ask your nurse or doctor.          atorvastatin 20 MG tablet Commonly known as: LIPITOR TAKE 1 TABLET BY MOUTH ONCE A DAY FOR CHOLESTEROL.   clobetasol ointment 0.05 % Commonly known as: TEMOVATE Apply 1 application topically 2 (two) times daily. What changed: additional instructions   cyanocobalamin 500 MCG tablet Commonly known as: VITAMIN B12 Take 500 mcg by mouth daily.   fluocinonide 0.05 % external solution Commonly known as: LIDEX Apply 1 application topically 2 (two) times daily. What changed: additional instructions   fluticasone 0.05 % cream Commonly known as: CUTIVATE Apply topically 2 (two) times daily. What changed: additional instructions   metFORMIN 500 MG tablet Commonly known as:  GLUCOPHAGE Take 1 tablet (500 mg total) by mouth daily.   omeprazole 20 MG capsule Commonly known as: PRILOSEC Take 1 capsule (20 mg total) by mouth 2 (two) times daily before a meal. For heartburn.   Rybelsus 3 MG Tabs Generic drug: Semaglutide Take 3 mg by mouth daily.   SUMAtriptan 100 MG tablet Commonly known as: IMITREX TAKE 1 TABLET BY MOUTH AT MIGRAINE ONSET,MAY REPEAT IN 2 HOURS IF HEADACHE PERSISTS/RECURS *DO NOT EXCEED '100MG'$  IN 24 HOURS**   tamsulosin 0.4 MG Caps capsule Commonly known as: FLOMAX TAKE 1 CAPSULE BY MOUTH ONCE DAILY   triamcinolone 0.025 % cream Commonly known as:  KENALOG Apply 1 application topically 2 (two) times daily.   vitamin C 250 MG tablet Commonly known as: ASCORBIC ACID Take 250 mg by mouth daily.   Vitamin D (Ergocalciferol) 1.25 MG (50000 UNIT) Caps capsule Commonly known as: DRISDOL Take 1 capsule (50,000 Units total) by mouth every 7 (seven) days.   Zinc 100 MG Tabs Take by mouth.        Allergies:  Allergies  Allergen Reactions   Penicillins     Passed out Did it involve swelling of the face/tongue/throat, SOB, or low BP? Yes Did it involve sudden or severe rash/hives, skin peeling, or any reaction on the inside of your mouth or nose? No Did you need to seek medical attention at a hospital or doctor's office? No When did it last happen?      30 + years If all above answers are "NO", may proceed with cephalosporin use.     Family History: Family History  Problem Relation Age of Onset   Early death Father    Kidney disease Father    Heart failure Sister     Social History:  reports that he quit smoking about 38 years ago. His smoking use included cigarettes. He has a 15.00 pack-year smoking history. He has never used smokeless tobacco. He reports that he does not currently use alcohol. He reports that he does not use drugs.   Physical Exam: BP 132/78   Pulse 80   Ht '4\' 11"'$  (1.499 m)   Wt 218 lb (98.9 kg)   BMI 44.03 kg/m   Constitutional:  Alert and oriented, No acute distress. HEENT: [place] AT Respiratory: Normal respiratory effort, no increased work of breathing.   Assessment & Plan:    1.  T1c high risk prostate cancer PSA bump 0.11-0.4 since July 2023 Lab visit for follow-up PSA in 6 months If follow-up PSA stable 1 year follow-up office visit PSMA/PET for rising PSA Tamsulosin refilled   Abbie Sons, MD  [facility] [address], [place], [place] [address] [phone]

## 2022-12-23 ENCOUNTER — Ambulatory Visit (INDEPENDENT_AMBULATORY_CARE_PROVIDER_SITE_OTHER): Payer: PPO | Admitting: Physician Assistant

## 2022-12-23 ENCOUNTER — Encounter (INDEPENDENT_AMBULATORY_CARE_PROVIDER_SITE_OTHER): Payer: Self-pay | Admitting: Physician Assistant

## 2022-12-23 VITALS — BP 140/82 | HR 73 | Temp 98.1°F | Ht 60.0 in | Wt 220.0 lb

## 2022-12-23 DIAGNOSIS — E559 Vitamin D deficiency, unspecified: Secondary | ICD-10-CM

## 2022-12-23 DIAGNOSIS — R7303 Prediabetes: Secondary | ICD-10-CM | POA: Diagnosis not present

## 2022-12-23 DIAGNOSIS — E669 Obesity, unspecified: Secondary | ICD-10-CM | POA: Diagnosis not present

## 2022-12-23 DIAGNOSIS — Z6841 Body Mass Index (BMI) 40.0 and over, adult: Secondary | ICD-10-CM

## 2022-12-23 MED ORDER — METFORMIN HCL 500 MG PO TABS: 500.0000 mg | ORAL_TABLET | Freq: Every day | ORAL | 0 refills | Status: DC

## 2022-12-23 MED ORDER — VITAMIN D (ERGOCALCIFEROL) 1.25 MG (50000 UNIT) PO CAPS: 50000.0000 [IU] | ORAL_CAPSULE | ORAL | 0 refills | Status: DC

## 2022-12-23 NOTE — Assessment & Plan Note (Signed)
Vitamin D Deficiency Vitamin D is at goal of 50.  Most recent vitamin D level was 50.7. He is on  prescription ergocalciferol 50,000 IU weekly. Lab Results  Component Value Date   VD25OH 50.7 10/29/2022   VD25OH 35.8 06/10/2022    Plan: Refill prescription vitamin D 50,000 IU weekly. Low vitamin D levels can be associated with adiposity and may result in leptin resistance and weight gain. Also associated with fatigue.  Currently on vitamin D supplementation without any adverse effects. Recheck vitamin D level at least 2-3 times yearly to avoid over supplementation.

## 2022-12-23 NOTE — Progress Notes (Signed)
Office: [phone]  /  Fax: [phone]    WEIGHT SUMMARY AND BIOMETRICS   Vitals Temp: 98.1 F (36.7 C) BP: (!) 140/82 Pulse Rate: 73 SpO2: 96 %     Anthropometric Measurements Height: 5' (1.524 m) Weight: 220 lb (99.8 kg) BMI (Calculated): 42.97 Weight at Last Visit: 218 lb Weight Lost Since Last Visit: 0 lb Weight Gained Since Last Visit: 2 lb Starting Weight: 230 lb Total Weight Loss (lbs): 12 lb (5.443 kg)     Body Composition  Body Fat %: 40.3 % Fat Mass (lbs): 89 lbs Muscle Mass (lbs): 125.2 lbs Total Body Water (lbs): 104.4 lbs Visceral Fat Rating : 28     Other Clinical Data Labs: No Today's Visit #: 11 Starting Date: 05/29/22        HPI  Chief Complaint: OBESITY  Preston Thomas is here to discuss his progress with his obesity treatment plan. He is on the the Category 3 Plan and states he is following his eating plan approximately 98 % of the time. He states he is exercising/resistance training 45 minutes 2 times per week.   Interval History:  Since last office visit he has maintained.  He is up 2 lbs, but up 1.2 lbs water.  He has been increasing his protein intake to 6-8 oz even at lunch and may be going over calorie needs.  Snacking on peanuts, but sometimes Lance peanut butter crackers or protein bars.  Hunger/appetite controlled.   We discussed switching to a low carbohydrate plan for the next few weeks to try and stimulate weight loss and he is agreeable to change in plan.    Pharmacotherapy: metformin for prediabetes  PHYSICAL EXAM:  Blood pressure (!) 140/82, pulse 73, temperature 98.1 F (36.7 C), height 5' (1.524 m), weight 220 lb (99.8 kg), SpO2 96 %. Body mass index is 42.97 kg/m.  General: He is overweight, cooperative, alert, well developed, and in no acute distress. PSYCH: Has normal mood, affect and thought process.   Lungs: Normal breathing effort, no conversational dyspnea.  DIAGNOSTIC DATA REVIEWED:  BMET     Component Value Date/Time   NA 142 10/29/2022 1506   K 4.8 10/29/2022 1506   CL 106 10/29/2022 1506   CO2 21 10/29/2022 1506   GLUCOSE 100 (H) 10/29/2022 1506   GLUCOSE 95 03/27/2022 1522   BUN 18 10/29/2022 1506   CREATININE 1.05 10/29/2022 1506   CREATININE 1.14 03/27/2022 1522   CALCIUM 9.5 10/29/2022 1506   GFRNONAA >60 05/17/2019 0906   GFRAA >60 05/17/2019 0906   Lab Results  Component Value Date   HGBA1C 5.8 (H) 10/29/2022   HGBA1C 6.1 09/02/2020   Lab Results  Component Value Date   INSULIN 34.2 (H) 06/10/2022   Lab Results  Component Value Date   TSH 2.420 06/10/2022   CBC    Component Value Date/Time   WBC 7.1 06/10/2022 1213   WBC 8.2 03/27/2022 1522   RBC 4.70 06/10/2022 1213   RBC 4.81 03/27/2022 1522   HGB 14.1 06/10/2022 1213   HCT 42.6 06/10/2022 1213   PLT 338 06/10/2022 1213   MCV 91 06/10/2022 1213   MCH 30.0 06/10/2022 1213   MCH 29.5 03/27/2022 1522   MCHC 33.1 06/10/2022 1213   MCHC 32.6 03/27/2022 1522   RDW 12.9 06/10/2022 1213   Iron Studies    Component Value Date/Time   FERRITIN 986 (H) 04/26/2019 0205   Lipid Panel     Component Value Date/Time   CHOL  145 06/10/2022 1213   TRIG 110 06/10/2022 1213   HDL 36 (L) 06/10/2022 1213   CHOLHDL 4 12/19/2021 1246   VLDL 31.8 12/19/2021 1246   LDLCALC 89 06/10/2022 1213   LDLDIRECT 167.0 05/15/2019 1100   Hepatic Function Panel     Component Value Date/Time   PROT 6.9 10/29/2022 1506   ALBUMIN 4.4 10/29/2022 1506   AST 24 10/29/2022 1506   ALT 53 (H) 10/29/2022 1506   ALKPHOS 77 10/29/2022 1506   BILITOT 0.2 10/29/2022 1506      Component Value Date/Time   TSH 2.420 06/10/2022 1213   Nutritional Lab Results  Component Value Date   VD25OH 50.7 10/29/2022   VD25OH 35.8 06/10/2022    ASSOCIATED CONDITIONS ADDRESSED TODAY  ASSESSMENT AND PLAN  Problem List Items Addressed This Visit     Prediabetes - Primary    Prediabetes Last A1c was 5.8  Medication(s):  Metformin 500 mg once daily breakfast No GI side effects with metformin. He is working on Camera operator to decrease simple carbohydrates, increase lean proteins and exercise to promote weight loss, improve glycemic control and prevent progression to Type 2 diabetes.   Lab Results  Component Value Date   HGBA1C 5.8 (H) 10/29/2022   HGBA1C 6.2 (H) 06/10/2022   HGBA1C 5.8 (A) 12/19/2021   HGBA1C 6.1 (A) 03/17/2021   HGBA1C 6.1 09/02/2020   Lab Results  Component Value Date   INSULIN 34.2 (H) 06/10/2022   Plan: Continue and refill Metformin 500 mg once daily breakfast Continue working on nutrition plan to decrease simple carbohydrates, increase lean proteins and exercise to promote weight loss, improve glycemic control and prevent progression to Type 2 diabetes.         Relevant Medications   metFORMIN (GLUCOPHAGE) 500 MG tablet   Vitamin D deficiency    Vitamin D Deficiency Vitamin D is at goal of 50.  Most recent vitamin D level was 50.7. He is on  prescription ergocalciferol 50,000 IU weekly. Lab Results  Component Value Date   VD25OH 50.7 10/29/2022   VD25OH 35.8 06/10/2022   Plan: Refill prescription vitamin D 50,000 IU weekly. Low vitamin D levels can be associated with adiposity and may result in leptin resistance and weight gain. Also associated with fatigue.  Currently on vitamin D supplementation without any adverse effects. Recheck vitamin D level at least 2-3 times yearly to avoid over supplementation.         Relevant Medications   Vitamin D, Ergocalciferol, (DRISDOL) 1.25 MG (50000 UNIT) CAPS capsule   Obesity ([place])- start BMI 44.92   Relevant Medications   metFORMIN (GLUCOPHAGE) 500 MG tablet   BMI 40.0-44.9, adult (HCC)  Current BMI 43.1   Relevant Medications   metFORMIN (GLUCOPHAGE) 500 MG tablet      TREATMENT PLAN FOR OBESITY:  Recommended Dietary Goals  Preston Thomas is currently in the action stage of change. As such, his goal is to continue weight  management plan. He has agreed to following a lower carbohydrate, vegetable and lean protein rich diet plan.  Behavioral Intervention  We discussed the following Behavioral Modification Strategies today: increasing lean protein intake, decreasing simple carbohydrates , increasing vegetables, increasing water intake, work on meal planning and easy cooking plans, planning for success, better snacking choices, and keeping healthy foods at home.  Additional resources provided today: NA  Recommended Physical Activity Goals  Preston Thomas has been advised to work up to 150 minutes of moderate intensity aerobic activity a week and strengthening exercises  2-3 times per week for cardiovascular health, weight loss maintenance and preservation of muscle mass.   He has agreed to Continue current level of physical activity    Pharmacotherapy We discussed various medication options to help Preston Thomas with his weight loss efforts and we both agreed to continue metformin for prediabetes and change to low carb plan to promote weight loss.    Return in about 3 weeks (around 01/13/2023).[name] He was informed of the importance of frequent follow up visits to maximize his success with intensive lifestyle modifications for his multiple health conditions.   ATTESTASTION STATEMENTS:  Reviewed by clinician on day of visit: allergies, medications, problem list, medical history, surgical history, family history, social history, and previous encounter notes.   I have personally spent 30 minutes total time today in preparation, patient care, nutritional counseling and documentation for this visit, including the following: review of clinical lab tests; review of medical tests/procedures/services.      Becky Colan, PA-C

## 2022-12-23 NOTE — Assessment & Plan Note (Addendum)
Prediabetes Last A1c was 5.8  Medication(s): Metformin 500 mg once daily breakfast No GI side effects with metformin. He is working on Camera operator to decrease simple carbohydrates, increase lean proteins and exercise to promote weight loss, improve glycemic control and prevent progression to Type 2 diabetes.   Lab Results  Component Value Date   HGBA1C 5.8 (H) 10/29/2022   HGBA1C 6.2 (H) 06/10/2022   HGBA1C 5.8 (A) 12/19/2021   HGBA1C 6.1 (A) 03/17/2021   HGBA1C 6.1 09/02/2020   Lab Results  Component Value Date   INSULIN 34.2 (H) 06/10/2022    Plan: Continue and refill Metformin 500 mg once daily breakfast Continue working on nutrition plan to decrease simple carbohydrates, increase lean proteins and exercise to promote weight loss, improve glycemic control and prevent progression to Type 2 diabetes.

## 2023-01-06 ENCOUNTER — Ambulatory Visit (INDEPENDENT_AMBULATORY_CARE_PROVIDER_SITE_OTHER): Payer: PPO | Admitting: Primary Care

## 2023-01-06 ENCOUNTER — Encounter: Payer: Self-pay | Admitting: Primary Care

## 2023-01-06 VITALS — BP 128/78 | HR 70 | Temp 97.2°F | Ht 60.0 in | Wt 219.0 lb

## 2023-01-06 DIAGNOSIS — G43009 Migraine without aura, not intractable, without status migrainosus: Secondary | ICD-10-CM

## 2023-01-06 DIAGNOSIS — E785 Hyperlipidemia, unspecified: Secondary | ICD-10-CM

## 2023-01-06 DIAGNOSIS — R1319 Other dysphagia: Secondary | ICD-10-CM

## 2023-01-06 DIAGNOSIS — G4733 Obstructive sleep apnea (adult) (pediatric): Secondary | ICD-10-CM | POA: Diagnosis not present

## 2023-01-06 DIAGNOSIS — L409 Psoriasis, unspecified: Secondary | ICD-10-CM

## 2023-01-06 DIAGNOSIS — Z0000 Encounter for general adult medical examination without abnormal findings: Secondary | ICD-10-CM | POA: Diagnosis not present

## 2023-01-06 DIAGNOSIS — Z6841 Body Mass Index (BMI) 40.0 and over, adult: Secondary | ICD-10-CM | POA: Diagnosis not present

## 2023-01-06 DIAGNOSIS — Z8546 Personal history of malignant neoplasm of prostate: Secondary | ICD-10-CM

## 2023-01-06 DIAGNOSIS — K219 Gastro-esophageal reflux disease without esophagitis: Secondary | ICD-10-CM

## 2023-01-06 DIAGNOSIS — R7303 Prediabetes: Secondary | ICD-10-CM

## 2023-01-06 MED ORDER — OMEPRAZOLE 20 MG PO CPDR
20.0000 mg | DELAYED_RELEASE_CAPSULE | Freq: Two times a day (BID) | ORAL | 3 refills | Status: DC
Start: 2023-01-06 — End: 2024-01-02

## 2023-01-06 MED ORDER — ATORVASTATIN CALCIUM 20 MG PO TABS: 20.0000 mg | ORAL_TABLET | Freq: Every day | ORAL | 3 refills | Status: DC

## 2023-01-06 NOTE — Assessment & Plan Note (Signed)
Controlled.  Continue sumatriptan 100 mg PRN.  Continue to monitor.

## 2023-01-06 NOTE — Assessment & Plan Note (Signed)
Improved.  Continue omeprazole 20 mg BID. Continue to monitor.

## 2023-01-06 NOTE — Assessment & Plan Note (Signed)
-  Continue CPAP machine nightly.

## 2023-01-06 NOTE — Progress Notes (Signed)
Subjective:    Patient ID: Preston Thomas, male    DOB: 1950/10/25, 72 y.o.   MRN: [id number]  HPI  Preston Thomas is a very pleasant 72 y.o. male who presents today for complete physical and follow up of chronic conditions.  Immunizations: -Tetanus: Completed in 2004 -Shingles: Never completed  -Pneumonia: Completed Prevnar 20 in 2023 and Pneumovax 23 in 2019  Diet: Improved diet. Following with healthy weight and wellness. Exercise: weight resistance and walking  Eye exam: Completes annually  Dental exam: Completed several years ago.   Colonoscopy: Completed in 2018, due 2028  PSA: Due  BP Readings from Last 3 Encounters:  01/06/23 128/78  12/23/22 (!) 140/82  12/10/22 132/78    Wt Readings from Last 3 Encounters:  01/06/23 219 lb (99.3 kg)  12/23/22 220 lb (99.8 kg)  12/10/22 218 lb (98.9 kg)   Body mass index is 42.77 kg/m.      Review of Systems  Constitutional:  Negative for unexpected weight change.  HENT:  Negative for rhinorrhea.   Respiratory:  Negative for cough and shortness of breath.   Cardiovascular:  Negative for chest pain.  Gastrointestinal:  Negative for constipation and diarrhea.  Genitourinary:  Negative for difficulty urinating.  Musculoskeletal:  Negative for arthralgias and myalgias.  Skin:  Negative for rash.  Allergic/Immunologic: Negative for environmental allergies.  Neurological:  Negative for dizziness and headaches.  Psychiatric/Behavioral:  The patient is not nervous/anxious.          Past Medical History:  Diagnosis Date   Acute conjunctivitis of both eyes 02/20/2022   Bilateral lower extremity edema    Cancer    COLONIC POLYPS, HX OF 08/31/2007   Qualifier: Diagnosis of  By: Alphonsus Sias MD, Ronnette Hila    DIVERTICULITIS, HX OF 08/31/2007   Qualifier: Diagnosis of  By: Alice Reichert CMA (AAMA), Natasha     DIVERTICULOSIS, COLON 08/31/2007   Qualifier: Diagnosis of  By: Alphonsus Sias MD, Ronnette Hila    Dyspnea     Related to Covid   Gallbladder problem    GERD (gastroesophageal reflux disease)    History of kidney stones    Hx of radiation therapy 04/11/2019   Pneumonia due to COVID-19 virus 04/19/2019   RENAL CALCULUS, HX OF 08/31/2007   Qualifier: Diagnosis of  By: Alice Reichert CMA (AAMA), Natasha     Shortness of breath 02/21/2020   Sleep apnea    Swallowing difficulty     Social History   Socioeconomic History   Marital status: Married    Spouse name: Not on file   Number of children: Not on file   Years of education: Not on file   Highest education level: Not on file  Occupational History   Not on file  Tobacco Use   Smoking status: Former    Packs/day: 1.50    Years: 10.00    Additional pack years: 0.00    Total pack years: 15.00    Types: Cigarettes    Quit date: 09/28/1984    Years since quitting: 38.2   Smokeless tobacco: Never  Vaping Use   Vaping Use: Never used  Substance and Sexual Activity   Alcohol use: Not Currently   Drug use: Never   Sexual activity: Yes  Other Topics Concern   Not on file  Social History Narrative   Not on file   Social Determinants of Health   Financial Resource Strain: Low Risk  (09/14/2022)   Overall Financial Resource Strain (CARDIA)  Difficulty of Paying Living Expenses: Not hard at all  Food Insecurity: No Food Insecurity (09/14/2022)   Hunger Vital Sign    Worried About Running Out of Food in the Last Year: Never true    Ran Out of Food in the Last Year: Never true  Transportation Needs: No Transportation Needs (09/14/2022)   PRAPARE - Administrator, Civil Service (Medical): No    Lack of Transportation (Non-Medical): No  Physical Activity: Insufficiently Active (09/14/2022)   Exercise Vital Sign    Days of Exercise per Week: 2 days    Minutes of Exercise per Session: 60 min  Stress: No Stress Concern Present (09/14/2022)   [employer] of Occupational Health - Occupational Stress Questionnaire    Feeling  of Stress : Not at all  Social Connections: Socially Integrated (09/14/2022)   Social Connection and Isolation Panel [NHANES]    Frequency of Communication with Friends and Family: More than three times a week    Frequency of Social Gatherings with Friends and Family: More than three times a week    Attends Religious Services: More than 4 times per year    Active Member of Clubs or Organizations: Yes    Attends Banker Meetings: More than 4 times per year    Marital Status: Married  Catering manager Violence: Not At Risk (09/14/2022)   Humiliation, Afraid, Rape, and Kick questionnaire    Fear of Current or Ex-Partner: No    Emotionally Abused: No    Physically Abused: No    Sexually Abused: No    Past Surgical History:  Procedure Laterality Date   APPENDECTOMY  1987   CHOLECYSTECTOMY  1987   RADIOACTIVE SEED IMPLANT N/A 06/27/2019   Procedure: RADIOACTIVE SEED IMPLANT/BRACHYTHERAPY IMPLANT;  Surgeon: Riki Altes, MD;  Location: [facility] ORS;  Service: Urology;  Laterality: N/A;    Family History  Problem Relation Age of Onset   Early death Father    Kidney disease Father    Heart failure Sister     Allergies  Allergen Reactions   Penicillins     Passed out Did it involve swelling of the face/tongue/throat, SOB, or low BP? Yes Did it involve sudden or severe rash/hives, skin peeling, or any reaction on the inside of your mouth or nose? No Did you need to seek medical attention at a hospital or doctor's office? No When did it last happen?      30 + years If all above answers are "NO", may proceed with cephalosporin use.     Current Outpatient Medications on File Prior to Visit  Medication Sig Dispense Refill   clobetasol ointment (TEMOVATE) 0.05 % Apply 1 application topically 2 (two) times daily. (Patient taking differently: Apply 1 application  topically 2 (two) times daily. As needed) 30 g 0   fluocinonide (LIDEX) 0.05 % external solution Apply 1  application topically 2 (two) times daily. (Patient taking differently: Apply 1 application  topically 2 (two) times daily. As needed) 60 mL 0   fluticasone (CUTIVATE) 0.05 % cream Apply topically 2 (two) times daily. (Patient taking differently: Apply topically 2 (two) times daily. As needed) 30 g 0   metFORMIN (GLUCOPHAGE) 500 MG tablet Take 1 tablet (500 mg total) by mouth daily. 30 tablet 0   SUMAtriptan (IMITREX) 100 MG tablet TAKE 1 TABLET BY MOUTH AT MIGRAINE ONSET,MAY REPEAT IN 2 HOURS IF HEADACHE PERSISTS/RECURS *DO NOT EXCEED 100MG  IN 24 HOURS** 10 tablet 0   tamsulosin (FLOMAX)  0.4 MG CAPS capsule Take 1 capsule (0.4 mg total) by mouth daily. 90 capsule 2   triamcinolone (KENALOG) 0.025 % cream Apply 1 application topically 2 (two) times daily.     vitamin B-12 (CYANOCOBALAMIN) 500 MCG tablet Take 500 mcg by mouth daily.     vitamin C (ASCORBIC ACID) 250 MG tablet Take 250 mg by mouth daily.     Vitamin D, Ergocalciferol, (DRISDOL) 1.25 MG (50000 UNIT) CAPS capsule Take 1 capsule (50,000 Units total) by mouth every 7 (seven) days. 5 capsule 0   Zinc 100 MG TABS Take by mouth.     No current facility-administered medications on file prior to visit.    BP 128/78   Pulse 70   Temp (!) 97.2 F (36.2 C) (Temporal)   Ht 5' (1.524 m)   Wt 219 lb (99.3 kg)   SpO2 98%   BMI 42.77 kg/m  Objective:   Physical Exam HENT:     Right Ear: Tympanic membrane and ear canal normal.     Left Ear: Tympanic membrane and ear canal normal.     Nose: Nose normal.     Right Sinus: No maxillary sinus tenderness or frontal sinus tenderness.     Left Sinus: No maxillary sinus tenderness or frontal sinus tenderness.  Eyes:     Conjunctiva/sclera: Conjunctivae normal.  Neck:     Thyroid: No thyromegaly.     Vascular: No carotid bruit.  Cardiovascular:     Rate and Rhythm: Normal rate and regular rhythm.     Heart sounds: Normal heart sounds.  Pulmonary:     Effort: Pulmonary effort is normal.      Breath sounds: Normal breath sounds. No wheezing or rales.  Abdominal:     General: Bowel sounds are normal.     Palpations: Abdomen is soft.     Tenderness: There is no abdominal tenderness.  Musculoskeletal:        General: Normal range of motion.     Cervical back: Neck supple.  Skin:    General: Skin is warm and dry.  Neurological:     Mental Status: He is alert and oriented to person, place, and time.     Cranial Nerves: No cranial nerve deficit.     Deep Tendon Reflexes: Reflexes are normal and symmetric.  Psychiatric:        Mood and Affect: Mood normal.           Assessment & Plan:  Preventative health care Assessment & Plan: Immunizations UTD. Discussed Shingrix vaccines at the pharmacy Colonoscopy UTD, due 2028 PSA UTD  Discussed the importance of a healthy diet and regular exercise in order for weight loss, and to reduce the risk of further co-morbidity.  Exam stable. Labs pending.  Follow up in 1 year for repeat physical.    Prediabetes Assessment & Plan: Improved. Reviewed A1C from February 2024 through [facility].  Continue to work on diet and exercise.  Continue metformin 500 mg daily.    Migraine without aura and without status migrainosus, not intractable Assessment & Plan: Controlled.  Continue sumatriptan 100 mg PRN.  Continue to monitor.    Hyperlipidemia, unspecified hyperlipidemia type Assessment & Plan: Reviewed lipid panel from September 2023. Continue atorvastatin 20 mg daily.   Orders: -     Atorvastatin Calcium; Take 1 tablet (20 mg total) by mouth daily. for cholesterol  Dispense: 90 tablet; Refill: 3  Gastroesophageal reflux disease, unspecified whether esophagitis present Assessment & Plan:  Improved.   Continue omeprazole 20 mg BID. Continue weight loss efforts.   Orders: -     Omeprazole; Take 1 capsule (20 mg total) by mouth 2 (two) times daily before a meal. For heartburn.  Dispense: 180  capsule; Refill: 3  OSA (obstructive sleep apnea) Assessment & Plan: Continue CPAP machine nightly.    Esophageal dysphagia Assessment & Plan: Improved.  Continue omeprazole 20 mg BID. Continue to monitor.    Class 3 severe obesity due to excess calories with serious comorbidity and body mass index (BMI) of 40.0 to 44.9 in adult Assessment & Plan: Commended him on weight loss.  Reviewed labs from March 2024 per [facility].    Psoriasis Assessment & Plan: Following with dermatology. Controlled.   Continue Lidex 0.05%, Cutivate 0.05% cream BID, Triamcinolone 0.25% , Temovate 0.05% all as needed.    History of prostate cancer Assessment & Plan: Following with Urology. PSA reviewed from March 2024.         Doreene Nest, NP

## 2023-01-06 NOTE — Assessment & Plan Note (Signed)
Reviewed lipid panel from September 2023. Continue atorvastatin 20 mg daily.

## 2023-01-06 NOTE — Assessment & Plan Note (Addendum)
Improved. Reviewed A1C from February 2024 through [facility].  Continue to work on diet and exercise.  Continue metformin 500 mg daily.

## 2023-01-06 NOTE — Assessment & Plan Note (Signed)
Commended him on weight loss.  Reviewed labs from March 2024 per [facility].

## 2023-01-06 NOTE — Assessment & Plan Note (Signed)
Following with dermatology. Controlled.   Continue Lidex 0.05%, Cutivate 0.05% cream BID, Triamcinolone 0.25% , Temovate 0.05% all as needed.

## 2023-01-06 NOTE — Patient Instructions (Signed)
It was a pleasure to see you today!   

## 2023-01-06 NOTE — Assessment & Plan Note (Signed)
Following with Urology. PSA reviewed from March 2024.

## 2023-01-06 NOTE — Assessment & Plan Note (Signed)
Immunizations UTD. Discussed Shingrix vaccines at the pharmacy Colonoscopy UTD, due 2028 PSA UTD  Discussed the importance of a healthy diet and regular exercise in order for weight loss, and to reduce the risk of further co-morbidity.  Exam stable. Labs pending.  Follow up in 1 year for repeat physical.

## 2023-01-06 NOTE — Assessment & Plan Note (Signed)
Improved.   Continue omeprazole 20 mg BID. Continue weight loss efforts.

## 2023-01-12 ENCOUNTER — Ambulatory Visit (INDEPENDENT_AMBULATORY_CARE_PROVIDER_SITE_OTHER): Payer: PPO | Admitting: Physician Assistant

## 2023-01-12 ENCOUNTER — Encounter (INDEPENDENT_AMBULATORY_CARE_PROVIDER_SITE_OTHER): Payer: Self-pay | Admitting: Physician Assistant

## 2023-01-12 VITALS — BP 115/66 | HR 68 | Temp 97.7°F | Ht 60.0 in | Wt 213.0 lb

## 2023-01-12 DIAGNOSIS — Z6841 Body Mass Index (BMI) 40.0 and over, adult: Secondary | ICD-10-CM | POA: Diagnosis not present

## 2023-01-12 DIAGNOSIS — E669 Obesity, unspecified: Secondary | ICD-10-CM | POA: Diagnosis not present

## 2023-01-12 DIAGNOSIS — E559 Vitamin D deficiency, unspecified: Secondary | ICD-10-CM | POA: Diagnosis not present

## 2023-01-12 DIAGNOSIS — R7303 Prediabetes: Secondary | ICD-10-CM | POA: Diagnosis not present

## 2023-01-12 MED ORDER — METFORMIN HCL 500 MG PO TABS
500.0000 mg | ORAL_TABLET | Freq: Every day | ORAL | 0 refills | Status: DC
Start: 2023-01-12 — End: 2023-02-02

## 2023-01-12 NOTE — Progress Notes (Signed)
Office: [phone]  /  Fax: [phone]  WEIGHT SUMMARY AND BIOMETRICS  Vitals Temp: 97.7 F (36.5 C) BP: 115/66 Pulse Rate: 68 SpO2: 95 %   Anthropometric Measurements Height: 5' (1.524 m) Weight: 213 lb (96.6 kg) BMI (Calculated): 41.6 Weight at Last Visit: 220 lb Weight Lost Since Last Visit: 7 lb Weight Gained Since Last Visit: 0 lb Starting Weight: 230 lb Total Weight Loss (lbs): 19 lb (8.618 kg)   Body Composition  Body Fat %: 38.3 % Fat Mass (lbs): 81.6 lbs Muscle Mass (lbs): 124.8 lbs Total Body Water (lbs): 99 lbs Visceral Fat Rating : 27   Other Clinical Data Fasting: no Labs: no Today's Visit #: 12 Starting Date: 05/29/22     HPI  Chief Complaint: OBESITY  Preston Thomas is here to discuss his progress with his obesity treatment plan. He is on the following a lower carbohydrate, vegetable and lean protein rich diet plan and states he is following his eating plan approximately 98 % of the time. He states he is exercising weight resistance 45 minutes 2 times per week.   Interval History:  Since last office visit he is down 7 lbs following switch to Low carbohydrate eating plan. He has been able to still eat out, but monitors proteins and carbohydrates carefully and is mindful of portions.  Hunger/appetite are well controlled.  He is not eating fast food anymore.  Exercising 45 mins 2 times weekly.   Pharmacotherapy: metformin for prediabetes. No side effects with metformin.   PHYSICAL EXAM:  Blood pressure 115/66, pulse 68, temperature 97.7 F (36.5 C), height 5' (1.524 m), weight 213 lb (96.6 kg), SpO2 95 %. Body mass index is 41.6 kg/m.  General: He is overweight, cooperative, alert, well developed, and in no acute distress. PSYCH: Has normal mood, affect and thought process.   Cardiovascular: regular rhythm Lungs: Normal breathing effort, no conversational dyspnea. Neuro: intact  DIAGNOSTIC DATA REVIEWED:  BMET    Component Value  Date/Time   NA 142 10/29/2022 1506   K 4.8 10/29/2022 1506   CL 106 10/29/2022 1506   CO2 21 10/29/2022 1506   GLUCOSE 100 (H) 10/29/2022 1506   GLUCOSE 95 03/27/2022 1522   BUN 18 10/29/2022 1506   CREATININE 1.05 10/29/2022 1506   CREATININE 1.14 03/27/2022 1522   CALCIUM 9.5 10/29/2022 1506   GFRNONAA >60 05/17/2019 0906   GFRAA >60 05/17/2019 0906   Lab Results  Component Value Date   HGBA1C 5.8 (H) 10/29/2022   HGBA1C 6.1 09/02/2020   Lab Results  Component Value Date   INSULIN 34.2 (H) 06/10/2022   Lab Results  Component Value Date   TSH 2.420 06/10/2022   CBC    Component Value Date/Time   WBC 7.1 06/10/2022 1213   WBC 8.2 03/27/2022 1522   RBC 4.70 06/10/2022 1213   RBC 4.81 03/27/2022 1522   HGB 14.1 06/10/2022 1213   HCT 42.6 06/10/2022 1213   PLT 338 06/10/2022 1213   MCV 91 06/10/2022 1213   MCH 30.0 06/10/2022 1213   MCH 29.5 03/27/2022 1522   MCHC 33.1 06/10/2022 1213   MCHC 32.6 03/27/2022 1522   RDW 12.9 06/10/2022 1213   Iron Studies    Component Value Date/Time   FERRITIN 986 (H) 04/26/2019 0205   Lipid Panel     Component Value Date/Time   CHOL 145 06/10/2022 1213   TRIG 110 06/10/2022 1213   HDL 36 (L) 06/10/2022 1213   CHOLHDL 4 12/19/2021 1246  VLDL 31.8 12/19/2021 1246   LDLCALC 89 06/10/2022 1213   LDLDIRECT 167.0 05/15/2019 1100   Hepatic Function Panel     Component Value Date/Time   PROT 6.9 10/29/2022 1506   ALBUMIN 4.4 10/29/2022 1506   AST 24 10/29/2022 1506   ALT 53 (H) 10/29/2022 1506   ALKPHOS 77 10/29/2022 1506   BILITOT 0.2 10/29/2022 1506      Component Value Date/Time   TSH 2.420 06/10/2022 1213   Nutritional Lab Results  Component Value Date   VD25OH 50.7 10/29/2022   VD25OH 35.8 06/10/2022    ASSOCIATED CONDITIONS ADDRESSED TODAY  ASSESSMENT AND PLAN  Problem List Items Addressed This Visit     Prediabetes - Primary    Prediabetes Last A1c was 5.8- not at goal/ Insulin 34.2- not at goal.    Medication(s): Metformin 500 mg once daily breakfast No side effects with metformin.  He is working on Engineer, technical sales to decrease simple carbohydrates, increase lean proteins and exercise to promote weight loss, improve glycemic control and prevent progression to Type 2 diabetes.   Lab Results  Component Value Date   HGBA1C 5.8 (H) 10/29/2022   HGBA1C 6.2 (H) 06/10/2022   HGBA1C 5.8 (A) 12/19/2021   HGBA1C 6.1 (A) 03/17/2021   HGBA1C 6.1 09/02/2020   Lab Results  Component Value Date   INSULIN 34.2 (H) 06/10/2022   Plan: Continue and refill Metformin 500 mg once daily breakfast Continue working on nutrition plan to decrease simple carbohydrates, increase lean proteins and exercise to promote weight loss, improve glycemic control and prevent progression to Type 2 diabetes.  Plan to obtain labs over the next 2 months in follow up.        Relevant Medications   metFORMIN (GLUCOPHAGE) 500 MG tablet   Vitamin D deficiency    Vitamin D Deficiency Vitamin D is at goal of 50.  Most recent vitamin D level was 50.7. He is on  prescription ergocalciferol 50,000 IU weekly. Lab Results  Component Value Date   VD25OH 50.7 10/29/2022   VD25OH 35.8 06/10/2022   Plan: Continue  prescription ergocalciferol 50,000 IU weekly Plan to recheck vitamin D level over next 2 months. Low vitamin D levels can be associated with adiposity and may result in leptin resistance and weight gain. Also associated with fatigue. Currently on vitamin D supplementation without any adverse effects.         Obesity (HCC)- start BMI 44.92   Relevant Medications   metFORMIN (GLUCOPHAGE) 500 MG tablet   BMI 40.0-44.9, adult (HCC)  Current BMI 43.1   Relevant Medications   metFORMIN (GLUCOPHAGE) 500 MG tablet    TREATMENT PLAN FOR OBESITY:  Recommended Dietary Goals  Preston Thomas is currently in the action stage of change. As such, his goal is to continue weight management plan. He has agreed to following a  lower carbohydrate, vegetable and lean protein rich diet plan.  Behavioral Intervention  We discussed the following Behavioral Modification Strategies today: increasing lean protein intake, decreasing simple carbohydrates , increasing vegetables, increasing fiber rich foods, avoiding skipping meals, increasing water intake, decreasing eating out or consumption of processed foods, and making healthy choices when eating convenient foods, and planning for success.  Additional resources provided today: NA  Recommended Physical Activity Goals  Bogdan has been advised to work up to 150 minutes of moderate intensity aerobic activity a week and strengthening exercises 2-3 times per week for cardiovascular health, weight loss maintenance and preservation of muscle mass.   He  has agreed to Continue current level of physical activity    Pharmacotherapy We discussed various medication options to help Khamar with his weight loss efforts and we both agreed to continue metformin for prediabetes and continue to work on nutritional and behavioral strategies to promote weight loss.    Return in about 3 weeks (around 02/02/2023).[name] He was informed of the importance of frequent follow up visits to maximize his success with intensive lifestyle modifications for his multiple health conditions.   ATTESTASTION STATEMENTS:  Reviewed by clinician on day of visit: allergies, medications, problem list, medical history, surgical history, family history, social history, and previous encounter notes.   I have personally spent 45 minutes total time today in preparation, patient care, nutritional counseling and documentation for this visit, including the following: review of clinical lab tests; review of medical tests/procedures/services.      Herman Mell, PA-C

## 2023-01-12 NOTE — Assessment & Plan Note (Signed)
Vitamin D Deficiency Vitamin D is at goal of 50.  Most recent vitamin D level was 50.7. He is on  prescription ergocalciferol 50,000 IU weekly. Lab Results  Component Value Date   VD25OH 50.7 10/29/2022   VD25OH 35.8 06/10/2022    Plan: Continue  prescription ergocalciferol 50,000 IU weekly Plan to recheck vitamin D level over next 2 months. Low vitamin D levels can be associated with adiposity and may result in leptin resistance and weight gain. Also associated with fatigue. Currently on vitamin D supplementation without any adverse effects.

## 2023-01-12 NOTE — Assessment & Plan Note (Addendum)
Prediabetes Last A1c was 5.8- not at goal/ Insulin 34.2- not at goal.   Medication(s): Metformin 500 mg once daily breakfast No side effects with metformin.  He is working on Engineer, technical sales to decrease simple carbohydrates, increase lean proteins and exercise to promote weight loss, improve glycemic control and prevent progression to Type 2 diabetes.   Lab Results  Component Value Date   HGBA1C 5.8 (H) 10/29/2022   HGBA1C 6.2 (H) 06/10/2022   HGBA1C 5.8 (A) 12/19/2021   HGBA1C 6.1 (A) 03/17/2021   HGBA1C 6.1 09/02/2020   Lab Results  Component Value Date   INSULIN 34.2 (H) 06/10/2022    Plan: Continue and refill Metformin 500 mg once daily breakfast Continue working on nutrition plan to decrease simple carbohydrates, increase lean proteins and exercise to promote weight loss, improve glycemic control and prevent progression to Type 2 diabetes.  Plan to obtain labs over the next 2 months in follow up.

## 2023-01-30 ENCOUNTER — Other Ambulatory Visit (INDEPENDENT_AMBULATORY_CARE_PROVIDER_SITE_OTHER): Payer: Self-pay | Admitting: Physician Assistant

## 2023-01-30 DIAGNOSIS — E559 Vitamin D deficiency, unspecified: Secondary | ICD-10-CM

## 2023-02-02 ENCOUNTER — Encounter (INDEPENDENT_AMBULATORY_CARE_PROVIDER_SITE_OTHER): Payer: Self-pay | Admitting: Physician Assistant

## 2023-02-02 ENCOUNTER — Ambulatory Visit (INDEPENDENT_AMBULATORY_CARE_PROVIDER_SITE_OTHER): Payer: PPO | Admitting: Physician Assistant

## 2023-02-02 VITALS — BP 126/87 | HR 69 | Temp 97.8°F | Ht 60.0 in | Wt 212.0 lb

## 2023-02-02 DIAGNOSIS — E669 Obesity, unspecified: Secondary | ICD-10-CM | POA: Diagnosis not present

## 2023-02-02 DIAGNOSIS — E559 Vitamin D deficiency, unspecified: Secondary | ICD-10-CM

## 2023-02-02 DIAGNOSIS — Z6841 Body Mass Index (BMI) 40.0 and over, adult: Secondary | ICD-10-CM | POA: Diagnosis not present

## 2023-02-02 DIAGNOSIS — R600 Localized edema: Secondary | ICD-10-CM

## 2023-02-02 DIAGNOSIS — R7303 Prediabetes: Secondary | ICD-10-CM | POA: Diagnosis not present

## 2023-02-02 MED ORDER — VITAMIN D (ERGOCALCIFEROL) 1.25 MG (50000 UNIT) PO CAPS
50000.0000 [IU] | ORAL_CAPSULE | ORAL | 0 refills | Status: DC
Start: 2023-02-02 — End: 2023-03-01

## 2023-02-02 MED ORDER — HYDROCHLOROTHIAZIDE 12.5 MG PO CAPS
12.5000 mg | ORAL_CAPSULE | ORAL | 0 refills | Status: DC
Start: 2023-02-02 — End: 2023-03-01

## 2023-02-02 MED ORDER — METFORMIN HCL 500 MG PO TABS
500.0000 mg | ORAL_TABLET | Freq: Every day | ORAL | 0 refills | Status: DC
Start: 2023-02-02 — End: 2023-03-02

## 2023-02-02 NOTE — Progress Notes (Signed)
Office: [phone]  /  Fax: [phone]  WEIGHT SUMMARY AND BIOMETRICS  Vitals Temp: 97.8 F (36.6 C) BP: 126/87 Pulse Rate: 69 SpO2: 98 %   Anthropometric Measurements Height: 5' (1.524 m) Weight: 212 lb (96.2 kg) BMI (Calculated): 41.4 Weight at Last Visit: 213 lb Weight Lost Since Last Visit: 1 lb Starting Weight: 230 lb   Body Composition  Body Fat %: 38 % Fat Mass (lbs): 80.6 lbs Muscle Mass (lbs): 125 lbs Total Body Water (lbs): 98.6 lbs Visceral Fat Rating : 26   Other Clinical Data Fasting: no Labs: no Today's Visit #: 13 Starting Date: 05/29/22     HPI  Chief Complaint: OBESITY  Preston Thomas is here to discuss his progress with his obesity treatment plan. He is on the following a lower carbohydrate, vegetable and lean protein rich diet plan and states he is following his eating plan approximately 98 % of the time. He states he is exercising 30 minutes 7 times per week.   Interval History:  Since last office visit he is down 1 lb/ Down 18 lbs total  Bio impedence scale: Up 0.2 lbs muscle mass, down 1 lbs adipose, down 0.4 lbs total water.  Notes some lower leg edema especially after walking in evenings.   Hunger/appetite-not excessive Stress- very manageable Sleep- Using CPAP for OSA nightly, restorative Exercise- Walking at least 30 minutes daily with goal of increasing to 45 minutes daily. Hydration- Drinking 40-50 oz of mainly Lipton diet green tea and we discussed increasing and adding some water intake to total at least 64 oz daily.     Pharmacotherapy: metformin for primary indication of prediabetes. No GI side effects with metformin.    PHYSICAL EXAM:  Blood pressure 126/87, pulse 69, temperature 97.8 F (36.6 C), height 5' (1.524 m), weight 212 lb (96.2 kg), SpO2 98 %. Body mass index is 41.4 kg/m.  General: He is overweight, cooperative, alert, well developed, and in no acute distress. PSYCH: Has normal mood, affect and thought  process.   Cardiovascular: HR 70's regular, + mild ankle and pedal edema Lungs: Normal breathing effort, no conversational dyspnea. Neuro: no focal deficits.   DIAGNOSTIC DATA REVIEWED:  BMET    Component Value Date/Time   NA 142 10/29/2022 1506   K 4.8 10/29/2022 1506   CL 106 10/29/2022 1506   CO2 21 10/29/2022 1506   GLUCOSE 100 (H) 10/29/2022 1506   GLUCOSE 95 03/27/2022 1522   BUN 18 10/29/2022 1506   CREATININE 1.05 10/29/2022 1506   CREATININE 1.14 03/27/2022 1522   CALCIUM 9.5 10/29/2022 1506   GFRNONAA >60 05/17/2019 0906   GFRAA >60 05/17/2019 0906   Lab Results  Component Value Date   HGBA1C 5.8 (H) 10/29/2022   HGBA1C 6.1 09/02/2020   Lab Results  Component Value Date   INSULIN 34.2 (H) 06/10/2022   Lab Results  Component Value Date   TSH 2.420 06/10/2022   CBC    Component Value Date/Time   WBC 7.1 06/10/2022 1213   WBC 8.2 03/27/2022 1522   RBC 4.70 06/10/2022 1213   RBC 4.81 03/27/2022 1522   HGB 14.1 06/10/2022 1213   HCT 42.6 06/10/2022 1213   PLT 338 06/10/2022 1213   MCV 91 06/10/2022 1213   MCH 30.0 06/10/2022 1213   MCH 29.5 03/27/2022 1522   MCHC 33.1 06/10/2022 1213   MCHC 32.6 03/27/2022 1522   RDW 12.9 06/10/2022 1213   Iron Studies    Component Value Date/Time  FERRITIN 986 (H) 04/26/2019 0205   Lipid Panel     Component Value Date/Time   CHOL 145 06/10/2022 1213   TRIG 110 06/10/2022 1213   HDL 36 (L) 06/10/2022 1213   CHOLHDL 4 12/19/2021 1246   VLDL 31.8 12/19/2021 1246   LDLCALC 89 06/10/2022 1213   LDLDIRECT 167.0 05/15/2019 1100   Hepatic Function Panel     Component Value Date/Time   PROT 6.9 10/29/2022 1506   ALBUMIN 4.4 10/29/2022 1506   AST 24 10/29/2022 1506   ALT 53 (H) 10/29/2022 1506   ALKPHOS 77 10/29/2022 1506   BILITOT 0.2 10/29/2022 1506      Component Value Date/Time   TSH 2.420 06/10/2022 1213   Nutritional Lab Results  Component Value Date   VD25OH 50.7 10/29/2022   VD25OH 35.8  06/10/2022    ASSOCIATED CONDITIONS ADDRESSED TODAY  ASSESSMENT AND PLAN  Problem List Items Addressed This Visit     Prediabetes - Primary   Relevant Medications   metFORMIN (GLUCOPHAGE) 500 MG tablet   Vitamin D deficiency   Relevant Medications   Vitamin D, Ergocalciferol, (DRISDOL) 1.25 MG (50000 UNIT) CAPS capsule   Obesity (HCC)- start BMI 44.92   Relevant Medications   metFORMIN (GLUCOPHAGE) 500 MG tablet   BMI 40.0-44.9, adult (HCC)  Current BMI 43.1   Relevant Medications   metFORMIN (GLUCOPHAGE) 500 MG tablet   Lower leg edema   Relevant Medications   hydrochlorothiazide (MICROZIDE) 12.5 MG capsule   Prediabetes Last A1c was 5.8 improved but not at goal/ Insulin 34.2 on 06/10/2022- not at goal Medication(s): Metformin 500 mg once daily breakfast No GI side effects with metformin.  He is working on [employer] to decrease simple carbohydrates, increase lean proteins and exercise to promote weight loss, improve glycemic control and prevent progression to Type 2 diabetes.   Lab Results  Component Value Date   HGBA1C 5.8 (H) 10/29/2022   HGBA1C 6.2 (H) 06/10/2022   HGBA1C 5.8 (A) 12/19/2021   HGBA1C 6.1 (A) 03/17/2021   HGBA1C 6.1 09/02/2020   Lab Results  Component Value Date   INSULIN 34.2 (H) 06/10/2022    Plan: Continue and refill Metformin 500 mg once daily breakfast Continue working on nutrition plan to decrease simple carbohydrates, increase lean proteins and exercise to promote weight loss, improve glycemic control and prevent progression to Type 2 diabetes.   Vitamin D Deficiency Vitamin D is at goal of 50.  Most recent vitamin D level was 50.7. He is on  prescription ergocalciferol 50,000 IU weekly. Lab Results  Component Value Date   VD25OH 50.7 10/29/2022   VD25OH 35.8 06/10/2022    Plan: Continue and refill  prescription ergocalciferol 50,000 IU weekly Low vitamin D levels can be associated with adiposity and may result in leptin  resistance and weight gain. Also associated with fatigue. Currently on vitamin D supplementation without any adverse effects.     Lower leg edema: Reports lower leg edema which is worse following walking in evenings.  BP borderline to mildly elevated.  Plan: Start HCTZ 12.56 mg every other day and monitor response.  Have asked that he check his BP with home cuff daily for the next few weeks and will monitor BP and leg edema closely.   Current BMI 41.4 Down 18 lbs overall TBW loss of 7.8%  TREATMENT PLAN FOR OBESITY:  Recommended Dietary Goals  Robertjohn is currently in the action stage of change. As such, his goal is to continue weight management  plan. He has agreed to following a lower carbohydrate, vegetable and lean protein rich diet plan.  Behavioral Intervention  We discussed the following Behavioral Modification Strategies today: increasing lean protein intake, decreasing simple carbohydrates , increasing vegetables, increasing lower glycemic fruits, increasing water intake, decreasing sodium intake, continue to practice mindfulness when eating, and planning for success.  Additional resources provided today: NA  Recommended Physical Activity Goals  Caide has been advised to work up to 150 minutes of moderate intensity aerobic activity a week and strengthening exercises 2-3 times per week for cardiovascular health, weight loss maintenance and preservation of muscle mass.   He has agreed to Continue current level of physical activity  working towards a goal of walking for  45 minutes daily.    Pharmacotherapy We discussed various medication options to help Leonette Most with his weight loss efforts and we both agreed to continue metformin for prediabetes and continue to work on nutritional and behavioral strategies to promote weight loss.      Return in about 4 weeks (around 03/02/2023).[name] He was informed of the importance of frequent follow up visits to maximize his success with  intensive lifestyle modifications for his multiple health conditions.   ATTESTASTION STATEMENTS:  Reviewed by clinician on day of visit: allergies, medications, problem list, medical history, surgical history, family history, social history, and previous encounter notes.   I have personally spent 42 minutes total time today in preparation, patient care, nutritional counseling and documentation for this visit, including the following: review of clinical lab tests; review of medical tests/procedures/services.      Bently Wyss, PA-C

## 2023-02-21 ENCOUNTER — Encounter: Payer: Self-pay | Admitting: Urology

## 2023-02-27 ENCOUNTER — Other Ambulatory Visit (INDEPENDENT_AMBULATORY_CARE_PROVIDER_SITE_OTHER): Payer: Self-pay | Admitting: Physician Assistant

## 2023-02-27 DIAGNOSIS — E559 Vitamin D deficiency, unspecified: Secondary | ICD-10-CM

## 2023-02-27 DIAGNOSIS — R600 Localized edema: Secondary | ICD-10-CM

## 2023-03-02 ENCOUNTER — Encounter (INDEPENDENT_AMBULATORY_CARE_PROVIDER_SITE_OTHER): Payer: Self-pay | Admitting: Physician Assistant

## 2023-03-02 ENCOUNTER — Ambulatory Visit (INDEPENDENT_AMBULATORY_CARE_PROVIDER_SITE_OTHER): Payer: PPO | Admitting: Physician Assistant

## 2023-03-02 VITALS — BP 128/84 | HR 65 | Temp 97.6°F | Ht 60.0 in | Wt 212.0 lb

## 2023-03-02 DIAGNOSIS — E669 Obesity, unspecified: Secondary | ICD-10-CM | POA: Diagnosis not present

## 2023-03-02 DIAGNOSIS — R7303 Prediabetes: Secondary | ICD-10-CM

## 2023-03-02 DIAGNOSIS — Z6841 Body Mass Index (BMI) 40.0 and over, adult: Secondary | ICD-10-CM

## 2023-03-02 DIAGNOSIS — R7989 Other specified abnormal findings of blood chemistry: Secondary | ICD-10-CM

## 2023-03-02 DIAGNOSIS — R600 Localized edema: Secondary | ICD-10-CM | POA: Diagnosis not present

## 2023-03-02 DIAGNOSIS — E559 Vitamin D deficiency, unspecified: Secondary | ICD-10-CM | POA: Diagnosis not present

## 2023-03-02 DIAGNOSIS — E785 Hyperlipidemia, unspecified: Secondary | ICD-10-CM

## 2023-03-02 MED ORDER — VITAMIN D (ERGOCALCIFEROL) 1.25 MG (50000 UNIT) PO CAPS: ORAL_CAPSULE | ORAL | 0 refills | Status: DC

## 2023-03-02 MED ORDER — METFORMIN HCL 500 MG PO TABS
500.0000 mg | ORAL_TABLET | Freq: Every day | ORAL | 0 refills | Status: DC
Start: 2023-03-02 — End: 2023-03-31

## 2023-03-02 MED ORDER — HYDROCHLOROTHIAZIDE 12.5 MG PO CAPS: ORAL_CAPSULE | ORAL | 0 refills | Status: DC

## 2023-03-02 NOTE — Progress Notes (Signed)
.smr  Office: [phone]  /  Fax: [phone]  WEIGHT SUMMARY AND BIOMETRICS  Vitals Temp: 97.6 F (36.4 C) BP: 128/84 Pulse Rate: 65 SpO2: 95 %   Anthropometric Measurements Height: 5' (1.524 m) Weight: 212 lb (96.2 kg) BMI (Calculated): 41.4 Weight at Last Visit: 212 lb Weight Lost Since Last Visit: 0 lb Weight Gained Since Last Visit: 0 lb Starting Weight: 230 lb   Body Composition  Body Fat %: 39 % Fat Mass (lbs): 82.8 lbs Muscle Mass (lbs): 123 lbs Total Body Water (lbs): 101.8 lbs Visceral Fat Rating : 27   Other Clinical Data Fasting: no Labs: Yes Today's Visit #: 14 Starting Date: 05/29/22     HPI  Chief Complaint: OBESITY  Preston Thomas is here to discuss his progress with his obesity treatment plan. He is on the following a lower carbohydrate, vegetable and lean protein rich diet plan and states he is following his eating plan approximately 95 % of the time. He states he is exercising/walking 30-45 minutes 3 times per week.   Interval History:  Since last office visit he maintained weight loss\total 18 pounds down TBW loss of 7.8% He was on vacation for a week at the beach and had a wonderful trip to [place].  He was able to enjoy himself, be more mindful of choices and did well maintaining his weight loss while traveling.  He was very pleased with his endurance level at the beach compared with last year. Hunger/appetite-well-controlled Cravings-denies excessive cravings Stress-manageable Exercise-he has been working on walking more with his wife and they plan to increase the frequency.  We also discussed starting a gentle [facility]  Plan to repeat fasting labs next visit  Pharmacotherapy: Metformin for prediabetes.  No GI side effects with metformin  TREATMENT PLAN FOR OBESITY:  Recommended Dietary Goals  Preston Thomas is currently in the action stage of change. As such, his goal is to continue  weight management plan. He has agreed to following a lower carbohydrate, vegetable and lean protein rich diet plan.  Behavioral Intervention  We discussed the following Behavioral Modification Strategies today: increasing lean protein intake, decreasing simple carbohydrates , increasing vegetables, increasing lower glycemic fruits, increasing water intake, work on tracking and journaling calories using tracking application, continue to practice mindfulness when eating, and planning for success.  Additional resources provided today: NA  Recommended Physical Activity Goals  Preston Thomas has been advised to work up to 150 minutes of moderate intensity aerobic activity a week and strengthening exercises 2-3 times per week for cardiovascular health, weight loss maintenance and preservation of muscle mass.   He has agreed to Continue current level of physical activity  and Start strengthening exercises with a goal of 2-3 sessions a week    Pharmacotherapy We discussed various medication options to help Preston Thomas with his weight loss efforts and we both agreed to continue metformin for prediabetes.    Return in about 4 weeks (around 03/30/2023).[name] He was informed of the importance of frequent follow up visits to maximize his success with intensive lifestyle modifications for his multiple health conditions.  PHYSICAL EXAM:  Blood pressure 128/84, pulse 65, temperature 97.6 F (36.4 C), height 5' (1.524 m), weight 212 lb (96.2 kg), SpO2 95 %. Body mass index is 41.4 kg/m.  General: He is overweight, cooperative, alert, well developed, and in no acute distress. PSYCH: Has normal mood, affect and thought process.   Cardiovascular: HR 60's regular. Mild  ankle edema.  Lungs: Normal breathing effort, no conversational dyspnea. Neuro: no focal deficits  DIAGNOSTIC DATA REVIEWED:  BMET    Component Value Date/Time   NA 142 10/29/2022 1506   K 4.8 10/29/2022 1506   CL 106 10/29/2022 1506   CO2 21  10/29/2022 1506   GLUCOSE 100 (H) 10/29/2022 1506   GLUCOSE 95 03/27/2022 1522   BUN 18 10/29/2022 1506   CREATININE 1.05 10/29/2022 1506   CREATININE 1.14 03/27/2022 1522   CALCIUM 9.5 10/29/2022 1506   GFRNONAA >60 05/17/2019 0906   GFRAA >60 05/17/2019 0906   Lab Results  Component Value Date   HGBA1C 5.8 (H) 10/29/2022   HGBA1C 6.1 09/02/2020   Lab Results  Component Value Date   INSULIN 34.2 (H) 06/10/2022   Lab Results  Component Value Date   TSH 2.420 06/10/2022   CBC    Component Value Date/Time   WBC 7.1 06/10/2022 1213   WBC 8.2 03/27/2022 1522   RBC 4.70 06/10/2022 1213   RBC 4.81 03/27/2022 1522   HGB 14.1 06/10/2022 1213   HCT 42.6 06/10/2022 1213   PLT 338 06/10/2022 1213   MCV 91 06/10/2022 1213   MCH 30.0 06/10/2022 1213   MCH 29.5 03/27/2022 1522   MCHC 33.1 06/10/2022 1213   MCHC 32.6 03/27/2022 1522   RDW 12.9 06/10/2022 1213   Iron Studies    Component Value Date/Time   FERRITIN 986 (H) 04/26/2019 0205   Lipid Panel     Component Value Date/Time   CHOL 145 06/10/2022 1213   TRIG 110 06/10/2022 1213   HDL 36 (L) 06/10/2022 1213   CHOLHDL 4 12/19/2021 1246   VLDL 31.8 12/19/2021 1246   LDLCALC 89 06/10/2022 1213   LDLDIRECT 167.0 05/15/2019 1100   Hepatic Function Panel     Component Value Date/Time   PROT 6.9 10/29/2022 1506   ALBUMIN 4.4 10/29/2022 1506   AST 24 10/29/2022 1506   ALT 53 (H) 10/29/2022 1506   ALKPHOS 77 10/29/2022 1506   BILITOT 0.2 10/29/2022 1506      Component Value Date/Time   TSH 2.420 06/10/2022 1213   Nutritional Lab Results  Component Value Date   VD25OH 50.7 10/29/2022   VD25OH 35.8 06/10/2022    ASSOCIATED CONDITIONS ADDRESSED TODAY  ASSESSMENT AND PLAN  Problem List Items Addressed This Visit     Prediabetes - Primary   Relevant Medications   metFORMIN (GLUCOPHAGE) 500 MG tablet   Vitamin D deficiency   Relevant Medications   Vitamin D, Ergocalciferol, (DRISDOL) 1.25 MG (50000 UNIT)  CAPS capsule   Obesity (HCC)- start BMI 44.92   Relevant Medications   metFORMIN (GLUCOPHAGE) 500 MG tablet   BMI 40.0-44.9, adult (HCC)  Current BMI 43.1   Relevant Medications   metFORMIN (GLUCOPHAGE) 500 MG tablet   Lower leg edema   Relevant Medications   hydrochlorothiazide (MICROZIDE) 12.5 MG capsule  Prediabetes Last A1c was 5.8/ Insulin 34.2- not at goals.   Medication(s): Metformin 500 mg once daily breakfast No GI side effects with metformin.  Polyphagia:No  He is working on nutrition plan to decrease simple carbohydrates, increase lean proteins and exercise to promote weight loss, improve glycemic control and prevent progression to Type 2 diabetes.   Lab Results  Component Value Date   HGBA1C 5.8 (H) 10/29/2022   HGBA1C 6.2 (H) 06/10/2022   HGBA1C 5.8 (A) 12/19/2021   HGBA1C 6.1 (A) 03/17/2021   HGBA1C 6.1 09/02/2020   Lab Results  Component  Value Date   INSULIN 34.2 (H) 06/10/2022    Plan: Continue and refill Metformin 500 mg once daily breakfast Continue working on nutrition plan to decrease simple carbohydrates, increase lean proteins and exercise to promote weight loss, improve glycemic control and prevent progression to Type 2 diabetes.  Recheck labs next visit.   Lower extremity edema:  Started low dose HCTZ 12.5 mg every other day with slight improvement in edema. Water weight was up slightly, but may be related to recent travel. BP 128/84 today.  Plan: Continue hydrochlorothiazide 12.5 mg every other day and recheck labs next visit and monitor closely.   Vitamin D Deficiency Vitamin D is at goal of 50.  Most recent vitamin D level was 50.7. He is on  prescription ergocalciferol 50,000 IU weekly. Lab Results  Component Value Date   VD25OH 50.7 10/29/2022   VD25OH 35.8 06/10/2022    Plan: Continue and refill  prescription ergocalciferol 50,000 IU weekly Low vitamin D levels can be associated with adiposity and may result in leptin resistance and  weight gain. Also associated with fatigue. Currently on vitamin D supplementation without any adverse effects.  Recheck labs next visit.   ATTESTASTION STATEMENTS:  Reviewed by clinician on day of visit: allergies, medications, problem list, medical history, surgical history, family history, social history, and previous encounter notes.   I have personally spent 37 minutes total time today in preparation, patient care, nutritional counseling and documentation for this visit, including the following: review of clinical lab tests; review of medical tests/procedures/services.      Marlita Keil, PA-C

## 2023-03-02 NOTE — Progress Notes (Unsigned)
03/03/2023 3:54 PM   Preston Thomas 1951/07/16 [id number]  Referring provider: Doreene Nest, NP [address] [place],  [place] [address]  Urologic history: 1. cT1c prostate cancer-high risk -Biopsy 10/2018, PSA 12.7, 28 g gland -10/12 cores positive Gleason 3+3 to 4+5 -CT/bone scan negative for metastatic disease -tx IMRT + brachytherapy + ADT; brachytherapy 05/2019  No chief complaint on file.   HPI: 72 y.o. male presents for ED.    SHIM 5  He has been having issues with erections since his treatment for prostate cancer.  The ADT significantly decreased his libido, but now it is starting to return.  He is having difficulty achieving erections. He has not tried any treatments.  Patient is not having spontaneous erections.  He denies any pain or curvature with erections.     SHIM     Row Name 03/03/23 1459         SHIM: Over the last 6 months:   How do you rate your confidence that you could get and keep an erection? Very Low     When you had erections with sexual stimulation, how often were your erections hard enough for penetration (entering your partner)? Almost Never or Never     During sexual intercourse, how often were you able to maintain your erection after you had penetrated (entered) your partner? Almost Never or Never     During sexual intercourse, how difficult was it to maintain your erection to completion of intercourse? Extremely Difficult     When you attempted sexual intercourse, how often was it satisfactory for you? Almost Never or Never       SHIM Total Score   SHIM 5              Score: 1-7 Severe ED 8-11 Moderate ED 12-16 Mild-Moderate ED 17-21 Mild ED 22-25 No ED   PMH: Past Medical History:  Diagnosis Date   Acute conjunctivitis of both eyes 02/20/2022   Bilateral lower extremity edema    Cancer (HCC)    COLONIC POLYPS, HX OF 08/31/2007   Qualifier: Diagnosis of  By: Alphonsus Sias MD, Ronnette Hila    DIVERTICULITIS, HX OF  08/31/2007   Qualifier: Diagnosis of  By: Alice Reichert CMA (AAMA), Natasha     DIVERTICULOSIS, COLON 08/31/2007   Qualifier: Diagnosis of  By: Alphonsus Sias MD, Ronnette Hila    Dyspnea    Related to Covid   Gallbladder problem    GERD (gastroesophageal reflux disease)    History of kidney stones    Hx of radiation therapy 04/11/2019   Pneumonia due to COVID-19 virus 04/19/2019   RENAL CALCULUS, HX OF 08/31/2007   Qualifier: Diagnosis of  By: Alice Reichert CMA (AAMA), Natasha     Shortness of breath 02/21/2020   Sleep apnea    Swallowing difficulty     Surgical History: Past Surgical History:  Procedure Laterality Date   APPENDECTOMY  1987   CHOLECYSTECTOMY  1987   RADIOACTIVE SEED IMPLANT N/A 06/27/2019   Procedure: RADIOACTIVE SEED IMPLANT/BRACHYTHERAPY IMPLANT;  Surgeon: Riki Altes, MD;  Location: [facility] ORS;  Service: Urology;  Laterality: N/A;    Home Medications:  Allergies as of 03/03/2023       Reactions   Penicillins    Passed out Did it involve swelling of the face/tongue/throat, SOB, or low BP? Yes Did it involve sudden or severe rash/hives, skin peeling, or any reaction on the inside of your mouth or nose? No Did you need  to seek medical attention at a hospital or doctor's office? No When did it last happen?      30 + years If all above answers are "NO", may proceed with cephalosporin use.        Medication List        Accurate as of March 03, 2023  3:54 PM. If you have any questions, ask your nurse or doctor.          atorvastatin 20 MG tablet Commonly known as: LIPITOR Take 1 tablet (20 mg total) by mouth daily. for cholesterol   clobetasol ointment 0.05 % Commonly known as: TEMOVATE Apply 1 application topically 2 (two) times daily. What changed: additional instructions   cyanocobalamin 500 MCG tablet Commonly known as: VITAMIN B12 Take 500 mcg by mouth daily.   fluocinonide 0.05 % external solution Commonly known as: LIDEX Apply 1 application topically 2  (two) times daily. What changed: additional instructions   fluticasone 0.05 % cream Commonly known as: CUTIVATE Apply topically 2 (two) times daily. What changed: additional instructions   hydrochlorothiazide 12.5 MG capsule Commonly known as: MICROZIDE TAKE ONE CAPSULE (12.5 MG TOTAL) BY MOUTH EVERY OTHER DAY.   metFORMIN 500 MG tablet Commonly known as: GLUCOPHAGE Take 1 tablet (500 mg total) by mouth daily.   omeprazole 20 MG capsule Commonly known as: PRILOSEC Take 1 capsule (20 mg total) by mouth 2 (two) times daily before a meal. For heartburn.   SUMAtriptan 100 MG tablet Commonly known as: IMITREX TAKE 1 TABLET BY MOUTH AT MIGRAINE ONSET,MAY REPEAT IN 2 HOURS IF HEADACHE PERSISTS/RECURS *DO NOT EXCEED 100MG  IN 24 HOURS**   tadalafil 5 MG tablet Commonly known as: CIALIS Take 1 tablet (5 mg total) by mouth daily as needed for erectile dysfunction. Started by: Michiel Cowboy, PA-C   tamsulosin 0.4 MG Caps capsule Commonly known as: FLOMAX Take 1 capsule (0.4 mg total) by mouth daily.   triamcinolone 0.025 % cream Commonly known as: KENALOG Apply 1 application topically 2 (two) times daily.   vitamin C 250 MG tablet Commonly known as: ASCORBIC ACID Take 250 mg by mouth daily.   Vitamin D (Ergocalciferol) 1.25 MG (50000 UNIT) Caps capsule Commonly known as: DRISDOL TAKE ONE CAPSULE (50,000 UNITS TOTAL) BY MOUTH EVERY SEVEN (SEVEN) DAYS.   Zinc 100 MG Tabs Take by mouth.        Allergies:  Allergies  Allergen Reactions   Penicillins     Passed out Did it involve swelling of the face/tongue/throat, SOB, or low BP? Yes Did it involve sudden or severe rash/hives, skin peeling, or any reaction on the inside of your mouth or nose? No Did you need to seek medical attention at a hospital or doctor's office? No When did it last happen?      30 + years If all above answers are "NO", may proceed with cephalosporin use.     Family History: Family History   Problem Relation Age of Onset   Early death Father    Kidney disease Father    Heart failure Sister     Social History:  reports that he quit smoking about 38 years ago. His smoking use included cigarettes. He has a 15.00 pack-year smoking history. He has never used smokeless tobacco. He reports that he does not currently use alcohol. He reports that he does not use drugs.   Physical Exam: BP 135/73   Pulse 74   Ht 5' (1.524 m)   Wt 212 lb (96.2 kg)  BMI 41.40 kg/m   Constitutional:  Well nourished. Alert and oriented, No acute distress. HEENT: [place] AT, moist mucus membranes.  Trachea midline Cardiovascular: No clubbing, cyanosis, or edema. Respiratory: Normal respiratory effort, no increased work of breathing. Neurologic: Grossly intact, no focal deficits, moving all 4 extremities. Psychiatric: Normal mood and affect.   Laboratory Data: Hemoglobin A1c (10/2022) 5.8 I have reviewed the labs.   Assessment & Plan:    1.  T1c high risk prostate cancer PSA bump 0.11-0.4 since July 2023 Lab visit for follow-up PSA in 6 months If follow-up PSA stable 1 year follow-up office visit PSMA/PET for rising PSA Tamsulosin refilled  2. Erectile dysfunction -He has been concerned with erectile dysfunction treatments is if it would raise his testosterone levels and then interfere with prostate cancer treatments or stimulate prostate cancer growth and I reassured him that they would not -We discussed PDE 5 inhibitors and ICI and penile prosthesis -He has no contraindications for PDE 5 inhibitors and since he is nave I suggested that we start with tadalafil 5 mg daily and then augment on the days he would like to have intercourse with 2 or 3 additional tablets  Return in about 1 month (around 04/02/2023) for SHIM .    Cloretta Ned  [facility] [address] [place], [place] [address] [phone]  I spent 30 minutes on the day of the  encounter to include pre-visit record review, face-to-face time with the patient, and post-visit ordering of tests.

## 2023-03-03 ENCOUNTER — Encounter: Payer: Self-pay | Admitting: Urology

## 2023-03-03 ENCOUNTER — Ambulatory Visit (INDEPENDENT_AMBULATORY_CARE_PROVIDER_SITE_OTHER): Payer: PPO | Admitting: Urology

## 2023-03-03 VITALS — BP 135/73 | HR 74 | Ht 60.0 in | Wt 212.0 lb

## 2023-03-03 DIAGNOSIS — N529 Male erectile dysfunction, unspecified: Secondary | ICD-10-CM

## 2023-03-03 DIAGNOSIS — C61 Malignant neoplasm of prostate: Secondary | ICD-10-CM | POA: Diagnosis not present

## 2023-03-03 MED ORDER — TADALAFIL 5 MG PO TABS
5.0000 mg | ORAL_TABLET | Freq: Every day | ORAL | 3 refills | Status: DC | PRN
Start: 2023-03-03 — End: 2023-04-13

## 2023-03-03 MED ORDER — TADALAFIL 5 MG PO TABS
5.0000 mg | ORAL_TABLET | Freq: Every day | ORAL | 3 refills | Status: DC | PRN
Start: 2023-03-03 — End: 2023-03-03

## 2023-03-03 NOTE — Patient Instructions (Signed)
Take one tadalafil 5 mg daily and on days when you want to be sexually active take 2 to 3 in addition to the daily dose.

## 2023-03-31 ENCOUNTER — Encounter (INDEPENDENT_AMBULATORY_CARE_PROVIDER_SITE_OTHER): Payer: Self-pay | Admitting: Physician Assistant

## 2023-03-31 ENCOUNTER — Ambulatory Visit (INDEPENDENT_AMBULATORY_CARE_PROVIDER_SITE_OTHER): Payer: PPO | Admitting: Physician Assistant

## 2023-03-31 VITALS — BP 116/72 | HR 67 | Temp 98.2°F | Ht 60.0 in | Wt 208.0 lb

## 2023-03-31 DIAGNOSIS — R600 Localized edema: Secondary | ICD-10-CM

## 2023-03-31 DIAGNOSIS — E559 Vitamin D deficiency, unspecified: Secondary | ICD-10-CM | POA: Diagnosis not present

## 2023-03-31 DIAGNOSIS — R7303 Prediabetes: Secondary | ICD-10-CM

## 2023-03-31 DIAGNOSIS — R7989 Other specified abnormal findings of blood chemistry: Secondary | ICD-10-CM | POA: Diagnosis not present

## 2023-03-31 DIAGNOSIS — E669 Obesity, unspecified: Secondary | ICD-10-CM

## 2023-03-31 DIAGNOSIS — E7849 Other hyperlipidemia: Secondary | ICD-10-CM

## 2023-03-31 DIAGNOSIS — Z6841 Body Mass Index (BMI) 40.0 and over, adult: Secondary | ICD-10-CM

## 2023-03-31 MED ORDER — VITAMIN D (ERGOCALCIFEROL) 1.25 MG (50000 UNIT) PO CAPS
ORAL_CAPSULE | ORAL | 0 refills | Status: DC
Start: 2023-03-31 — End: 2023-05-04

## 2023-03-31 MED ORDER — METFORMIN HCL 500 MG PO TABS
500.0000 mg | ORAL_TABLET | Freq: Every day | ORAL | 0 refills | Status: DC
Start: 2023-03-31 — End: 2023-05-04

## 2023-03-31 MED ORDER — HYDROCHLOROTHIAZIDE 12.5 MG PO CAPS
ORAL_CAPSULE | ORAL | 0 refills | Status: DC
Start: 2023-03-31 — End: 2023-05-04

## 2023-03-31 NOTE — Progress Notes (Signed)
.smr  Office: [phone]  /  Fax: [phone]  WEIGHT SUMMARY AND BIOMETRICS  Vitals Temp: 98.2 F (36.8 C) BP: 116/72 Pulse Rate: 67 SpO2: 95 %   Anthropometric Measurements Height: 5' (1.524 m) Weight: 208 lb (94.3 kg) BMI (Calculated): 40.62 Weight at Last Visit: 212 lb Weight Lost Since Last Visit: 4 lb Weight Gained Since Last Visit: 0 Starting Weight: 229 lb Total Weight Loss (lbs): 21 lb (9.526 kg) Peak Weight: 238 lb   Body Composition  Body Fat %: 37.3 % Fat Mass (lbs): 77.8 lbs Muscle Mass (lbs): 124.2 lbs Total Body Water (lbs): 98.4 lbs Visceral Fat Rating : 26   Other Clinical Data Fasting: yes Labs: yes Today's Visit #: 15 Starting Date: 06/10/22     HPI  Chief Complaint: OBESITY  Scarlett is here to discuss his progress with his obesity treatment plan. He is on the following a lower carbohydrate, vegetable and lean protein rich diet plan and states he is following his eating plan approximately 98 % of the time. He states he is exercising walking 45 minutes 4-5 times per week.   Interval History:  Since last office visit he down 4 pounds/down total of 21 pounds TBW loss 9.2%  Bioimpedance scale results reviewed with patient: Up 1.2 pounds muscle mass Down 5 pounds adipose mass Down 3.4 pounds total body water  Hunger/appetite-well-controlled Cravings-not excessive Stress-manageable Sleep-restorative Exercise-he has been walking up to 2 miles 5 times per week.  We discussed adding some ankle weights and to work on strengthening and may look at a weighted vest over the next month as well.   Fasting labs obtained today.  He was informed we would discuss his lab results at his next visit unless there is a critical issue that needs to be addressed sooner. He agreed to keep his next visit at the agreed upon time to discuss these results.    Pharmacotherapy: Metformin for primary indication of prediabetes.  No GI or other side effects with  metformin  TREATMENT PLAN FOR OBESITY:  Recommended Dietary Goals  Patriot is currently in the action stage of change. As such, his goal is to continue weight management plan. He has agreed to following a lower carbohydrate, vegetable and lean protein rich diet plan.  Behavioral Intervention  We discussed the following Behavioral Modification Strategies today: increasing lean protein intake, decreasing simple carbohydrates , increasing vegetables, increasing lower glycemic fruits, increasing fiber rich foods, increasing water intake, continue to practice mindfulness when eating, and planning for success.  Additional resources provided today: NA  Recommended Physical Activity Goals  Mykael has been advised to work up to 150 minutes of moderate intensity aerobic activity a week and strengthening exercises 2-3 times per week for cardiovascular health, weight loss maintenance and preservation of muscle mass.   He has agreed to Continue current level of physical activity  and add ankle weights to walking   Pharmacotherapy We discussed various medication options to help Leonette Most with his weight loss efforts and we both agreed to continue metformin for primary indication of prediabetes.    Return in about 4 weeks (around 04/28/2023).[name] He was informed of the importance of frequent follow up visits to maximize his success with intensive lifestyle modifications for his multiple health conditions.  PHYSICAL EXAM:  Blood pressure 116/72, pulse 67, temperature 98.2 F (36.8 C), height 5' (1.524 m), weight 208 lb (94.3 kg), SpO2 95 %. Body mass index is 40.62 kg/m.  General: He is overweight, cooperative, alert, well developed,  and in no acute distress. PSYCH: Has normal mood, affect and thought process.   Cardiovascular: HR disease and regular, blood pressure 116/72 Lungs: Normal breathing effort, no conversational dyspnea. Neuro: No focal deficits  DIAGNOSTIC DATA REVIEWED:  BMET     Component Value Date/Time   NA 142 10/29/2022 1506   K 4.8 10/29/2022 1506   CL 106 10/29/2022 1506   CO2 21 10/29/2022 1506   GLUCOSE 100 (H) 10/29/2022 1506   GLUCOSE 95 03/27/2022 1522   BUN 18 10/29/2022 1506   CREATININE 1.05 10/29/2022 1506   CREATININE 1.14 03/27/2022 1522   CALCIUM 9.5 10/29/2022 1506   GFRNONAA >60 05/17/2019 0906   GFRAA >60 05/17/2019 0906   Lab Results  Component Value Date   HGBA1C 5.8 (H) 10/29/2022   HGBA1C 6.1 09/02/2020   Lab Results  Component Value Date   INSULIN 34.2 (H) 06/10/2022   Lab Results  Component Value Date   TSH 2.420 06/10/2022   CBC    Component Value Date/Time   WBC 7.1 06/10/2022 1213   WBC 8.2 03/27/2022 1522   RBC 4.70 06/10/2022 1213   RBC 4.81 03/27/2022 1522   HGB 14.1 06/10/2022 1213   HCT 42.6 06/10/2022 1213   PLT 338 06/10/2022 1213   MCV 91 06/10/2022 1213   MCH 30.0 06/10/2022 1213   MCH 29.5 03/27/2022 1522   MCHC 33.1 06/10/2022 1213   MCHC 32.6 03/27/2022 1522   RDW 12.9 06/10/2022 1213   Iron Studies    Component Value Date/Time   FERRITIN 986 (H) 04/26/2019 0205   Lipid Panel     Component Value Date/Time   CHOL 145 06/10/2022 1213   TRIG 110 06/10/2022 1213   HDL 36 (L) 06/10/2022 1213   CHOLHDL 4 12/19/2021 1246   VLDL 31.8 12/19/2021 1246   LDLCALC 89 06/10/2022 1213   LDLDIRECT 167.0 05/15/2019 1100   Hepatic Function Panel     Component Value Date/Time   PROT 6.9 10/29/2022 1506   ALBUMIN 4.4 10/29/2022 1506   AST 24 10/29/2022 1506   ALT 53 (H) 10/29/2022 1506   ALKPHOS 77 10/29/2022 1506   BILITOT 0.2 10/29/2022 1506      Component Value Date/Time   TSH 2.420 06/10/2022 1213   Nutritional Lab Results  Component Value Date   VD25OH 50.7 10/29/2022   VD25OH 35.8 06/10/2022    ASSOCIATED CONDITIONS ADDRESSED TODAY  ASSESSMENT AND PLAN  Problem List Items Addressed This Visit     Hyperlipidemia   Relevant Medications   hydrochlorothiazide (MICROZIDE) 12.5  MG capsule   Other Relevant Orders   Lipid Panel With LDL/HDL Ratio   Prediabetes   Relevant Medications   metFORMIN (GLUCOPHAGE) 500 MG tablet   Other Relevant Orders   CMP14+EGFR   Hemoglobin A1c   Insulin, random   Vitamin D deficiency - Primary   Relevant Medications   Vitamin D, Ergocalciferol, (DRISDOL) 1.25 MG (50000 UNIT) CAPS capsule   Other Relevant Orders   VITAMIN D 25 Hydroxy (Vit-D Deficiency, Fractures)   Low vitamin B12 level   Relevant Orders   Vitamin B12   CBC with Differential/Platelet   Obesity (HCC)- start BMI 44.92   Relevant Medications   metFORMIN (GLUCOPHAGE) 500 MG tablet   BMI 40.0-44.9, adult (HCC)  Current BMI 43.1   Relevant Medications   metFORMIN (GLUCOPHAGE) 500 MG tablet   Lower leg edema   Relevant Medications   hydrochlorothiazide (MICROZIDE) 12.5 MG capsule   Vitamin D Deficiency Vitamin  D is at goal of 50.  Most recent vitamin D level was 50.7. He is on  prescription ergocalciferol 50,000 IU weekly.  No nausea, vomiting or muscle weakness or other side effects with ergocalciferol Lab Results  Component Value Date   VD25OH 50.7 10/29/2022   VD25OH 35.8 06/10/2022    Plan: Continue and refill  prescription ergocalciferol 50,000 IU weekly Recheck labs today and adjust supplementation accordingly.  Low vitamin D levels can be associated with adiposity and may result in leptin resistance and weight gain. Also associated with fatigue. Currently on vitamin D supplementation without any adverse effects.   Prediabetes Last A1c was 5.8-improved from 6.2/insulin 34.2-not at goal. Medication(s): Metformin 500 mg once daily breakfast no GI side effects with metformin. Polyphagia:No Lab Results  Component Value Date   HGBA1C 5.8 (H) 10/29/2022   HGBA1C 6.2 (H) 06/10/2022   HGBA1C 5.8 (A) 12/19/2021   HGBA1C 6.1 (A) 03/17/2021   HGBA1C 6.1 09/02/2020   Lab Results  Component Value Date   INSULIN 34.2 (H) 06/10/2022    Plan: Continue  and refill Metformin 500 mg once daily breakfast Recheck fasting labs today.  Continue working on nutrition plan to decrease simple carbohydrates, increase lean proteins and exercise to promote weight loss, improve glycemic control and prevent progression to Type 2 diabetes.    Lower extremity edema: Taking hydrochlorothiazide 12.5 mg every other day with good result.  Renal function normal. Bp in normal range.  Plan: Recheck labs today. Continue every other day hydrochlorothiazide 12.5 mg and refilled today.   Hyperlipidemia LDL is at goal. Medication(s): Lipitor 20 mg daily Cardiovascular risk factors: dyslipidemia, male gender, and obesity (BMI >= 30 kg/m2)  Lab Results  Component Value Date   CHOL 145 06/10/2022   HDL 36 (L) 06/10/2022   LDLCALC 89 06/10/2022   LDLDIRECT 167.0 05/15/2019   TRIG 110 06/10/2022   CHOLHDL 4 12/19/2021   CHOLHDL 4 09/02/2020   CHOLHDL 8 05/15/2019   Lab Results  Component Value Date   ALT 53 (H) 10/29/2022   AST 24 10/29/2022   ALKPHOS 77 10/29/2022   BILITOT 0.2 10/29/2022   The 10-year ASCVD risk score (Arnett DK, et al., 2019) is: 16.3%   Values used to calculate the score:     Age: 65 years     Sex: Male     Is Non-Hispanic African American: No     Diabetic: No     Tobacco smoker: No     Systolic Blood Pressure: 116 mmHg     Is BP treated: Yes     HDL Cholesterol: 36 mg/dL     Total Cholesterol: 145 mg/dL  Plan: Continue statin. Continue to work on nutrition plan -decreasing simple carbohydrates, increasing lean proteins, decreasing saturated fats and cholesterol , avoiding trans fats and exercise as able to promote weight loss, improve lipids and decrease cardiovascular risks. May be candidate for incretin therapy based on cardiovascular risks.  Recheck fasting lipids today.   Low serum B 12 level: Reports improvement in energy level with weight loss and regular exercise.  Last B 12 improved at 728 from 313.  Plan: Recheck  CBC, B 12 levels today and supplement accordingly.  Continue to work on nutrition plan to promote weight loss and improve overall health.    ATTESTASTION STATEMENTS:  Reviewed by clinician on day of visit: allergies, medications, problem list, medical history, surgical history, family history, social history, and previous encounter notes.   I have personally spent 42  minutes total time today in preparation, patient care, nutritional counseling and documentation for this visit, including the following: review of clinical lab tests; review of medical tests/procedures/services.      Noelle Hoogland, PA-C

## 2023-04-01 LAB — CMP14+EGFR
ALT: 32 IU/L (ref 0–44)
AST: 15 IU/L (ref 0–40)
Albumin: 4.3 g/dL (ref 3.9–4.9)
Alkaline Phosphatase: 67 IU/L (ref 44–121)
BUN/Creatinine Ratio: 20 (ref 10–24)
BUN: 19 mg/dL (ref 8–27)
Bilirubin Total: 0.3 mg/dL (ref 0.0–1.2)
CO2: 22 mmol/L (ref 20–29)
Calcium: 9 mg/dL (ref 8.6–10.2)
Chloride: 107 mmol/L — ABNORMAL HIGH (ref 96–106)
Creatinine, Ser: 0.97 mg/dL (ref 0.76–1.27)
Globulin, Total: 2.2 g/dL (ref 1.5–4.5)
Glucose: 98 mg/dL (ref 70–99)
Potassium: 4.3 mmol/L (ref 3.5–5.2)
Sodium: 144 mmol/L (ref 134–144)
Total Protein: 6.5 g/dL (ref 6.0–8.5)
eGFR: 85 mL/min/{1.73_m2} (ref >59)

## 2023-04-01 LAB — LIPID PANEL WITH LDL/HDL RATIO
Cholesterol, Total: 117 mg/dL (ref 100–199)
HDL: 34 mg/dL — ABNORMAL LOW (ref >39)
LDL Chol Calc (NIH): 67 mg/dL (ref 0–99)
LDL/HDL Ratio: 2 ratio (ref 0.0–3.6)
Triglycerides: 81 mg/dL (ref 0–149)
VLDL Cholesterol Cal: 16 mg/dL (ref 5–40)

## 2023-04-01 LAB — CBC WITH DIFFERENTIAL/PLATELET
Basophils Absolute: 0.1 10*3/uL (ref 0.0–0.2)
Basos: 1 %
EOS (ABSOLUTE): 0.2 10*3/uL (ref 0.0–0.4)
Eos: 3 %
Hematocrit: 44.3 % (ref 37.5–51.0)
Hemoglobin: 14.4 g/dL (ref 13.0–17.7)
Immature Grans (Abs): 0 10*3/uL (ref 0.0–0.1)
Immature Granulocytes: 1 %
Lymphocytes Absolute: 1.4 10*3/uL (ref 0.7–3.1)
Lymphs: 17 %
MCH: 29.5 pg (ref 26.6–33.0)
MCHC: 32.5 g/dL (ref 31.5–35.7)
MCV: 91 fL (ref 79–97)
Monocytes Absolute: 0.7 10*3/uL (ref 0.1–0.9)
Monocytes: 9 %
Neutrophils Absolute: 5.7 10*3/uL (ref 1.4–7.0)
Neutrophils: 69 %
Platelets: 279 10*3/uL (ref 150–450)
RBC: 4.88 x10E6/uL (ref 4.14–5.80)
RDW: 13.4 % (ref 11.6–15.4)
WBC: 8.1 10*3/uL (ref 3.4–10.8)

## 2023-04-01 LAB — VITAMIN B12: Vitamin B-12: 511 pg/mL (ref 232–1245)

## 2023-04-01 LAB — HEMOGLOBIN A1C
Est. average glucose Bld gHb Est-mCnc: 117 mg/dL
Hgb A1c MFr Bld: 5.7 % — ABNORMAL HIGH (ref 4.8–5.6)

## 2023-04-01 LAB — VITAMIN D 25 HYDROXY (VIT D DEFICIENCY, FRACTURES): Vit D, 25-Hydroxy: 69.6 ng/mL (ref 30.0–100.0)

## 2023-04-01 LAB — INSULIN, RANDOM: INSULIN: 19 u[IU]/mL (ref 2.6–24.9)

## 2023-04-07 DIAGNOSIS — G4733 Obstructive sleep apnea (adult) (pediatric): Secondary | ICD-10-CM | POA: Diagnosis not present

## 2023-04-12 NOTE — Progress Notes (Unsigned)
04/13/2023 2:01 PM   Preston Thomas 09-10-1951 [id number]  Referring provider: Doreene Nest, NP [address] [place],  [place] [address]  Urologic history: 1. cT1c prostate cancer-high risk -PSA (11/2022) 0.4 -Biopsy 10/2018, PSA 12.7, 28 g gland -10/12 cores positive Gleason 3+3 to 4+5 -CT/bone scan negative for metastatic disease -tx IMRT + brachytherapy + ADT; brachytherapy 05/2019 -has repeat PSA in September 2024  2. ED -contributing factors of age, prostate cancer, prostate radiation, OSA, DM, HLD, obesity, history of COVID and history of smoking -tadalafil 5 mg daily w/augmenting with up to three tablets for intercourse   Chief Complaint  Patient presents with   Erectile Dysfunction    HPI: 72 y.o. male presents for ED.    SHIM 23  He has had excellent response to tadalafil 5 mg daily augmenting with up to 3 tablets for intercourse.  Patient is not having spontaneous erections.  He denies any pain or curvature with erections.      SHIM     Row Name 04/13/23 1335         SHIM: Over the last 6 months:   How do you rate your confidence that you could get and keep an erection? High     When you had erections with sexual stimulation, how often were your erections hard enough for penetration (entering your partner)? Most Times (much more than half the time)     During sexual intercourse, how often were you able to maintain your erection after you had penetrated (entered) your partner? Almost Always or Always     During sexual intercourse, how difficult was it to maintain your erection to completion of intercourse? Not Difficult     When you attempted sexual intercourse, how often was it satisfactory for you? Almost Always or Always       SHIM Total Score   SHIM 23              Score: 1-7 Severe ED 8-11 Moderate ED 12-16 Mild-Moderate ED 17-21 Mild ED 22-25 No ED   PMH: Past Medical History:  Diagnosis Date   Acute conjunctivitis  of both eyes 02/20/2022   Bilateral lower extremity edema    Cancer (HCC)    COLONIC POLYPS, HX OF 08/31/2007   Qualifier: Diagnosis of  By: Alphonsus Sias MD, Ronnette Hila    DIVERTICULITIS, HX OF 08/31/2007   Qualifier: Diagnosis of  By: Alice Reichert CMA (AAMA), Natasha     DIVERTICULOSIS, COLON 08/31/2007   Qualifier: Diagnosis of  By: Alphonsus Sias MD, Ronnette Hila    Dyspnea    Related to Covid   Gallbladder problem    GERD (gastroesophageal reflux disease)    History of kidney stones    Hx of radiation therapy 04/11/2019   Pneumonia due to COVID-19 virus 04/19/2019   RENAL CALCULUS, HX OF 08/31/2007   Qualifier: Diagnosis of  By: Alice Reichert CMA (AAMA), Natasha     Shortness of breath 02/21/2020   Sleep apnea    Swallowing difficulty     Surgical History: Past Surgical History:  Procedure Laterality Date   APPENDECTOMY  1987   CHOLECYSTECTOMY  1987   RADIOACTIVE SEED IMPLANT N/A 06/27/2019   Procedure: RADIOACTIVE SEED IMPLANT/BRACHYTHERAPY IMPLANT;  Surgeon: Riki Altes, MD;  Location: [facility] ORS;  Service: Urology;  Laterality: N/A;    Home Medications:  Allergies as of 04/13/2023       Reactions   Penicillins    Passed out Did it involve swelling  of the face/tongue/throat, SOB, or low BP? Yes Did it involve sudden or severe rash/hives, skin peeling, or any reaction on the inside of your mouth or nose? No Did you need to seek medical attention at a hospital or doctor's office? No When did it last happen?      30 + years If all above answers are "NO", may proceed with cephalosporin use.        Medication List        Accurate as of April 13, 2023  2:01 PM. If you have any questions, ask your nurse or doctor.          atorvastatin 20 MG tablet Commonly known as: LIPITOR Take 1 tablet (20 mg total) by mouth daily. for cholesterol   clobetasol ointment 0.05 % Commonly known as: TEMOVATE Apply 1 application topically 2 (two) times daily. What changed: additional instructions    cyanocobalamin 500 MCG tablet Commonly known as: VITAMIN B12 Take 500 mcg by mouth daily.   fluocinonide 0.05 % external solution Commonly known as: LIDEX Apply 1 application topically 2 (two) times daily. What changed: additional instructions   fluticasone 0.05 % cream Commonly known as: CUTIVATE Apply topically 2 (two) times daily. What changed: additional instructions   hydrochlorothiazide 12.5 MG capsule Commonly known as: MICROZIDE TAKE ONE CAPSULE (12.5 MG TOTAL) BY MOUTH EVERY OTHER DAY.   metFORMIN 500 MG tablet Commonly known as: GLUCOPHAGE Take 1 tablet (500 mg total) by mouth daily.   omeprazole 20 MG capsule Commonly known as: PRILOSEC Take 1 capsule (20 mg total) by mouth 2 (two) times daily before a meal. For heartburn.   SUMAtriptan 100 MG tablet Commonly known as: IMITREX TAKE 1 TABLET BY MOUTH AT MIGRAINE ONSET,MAY REPEAT IN 2 HOURS IF HEADACHE PERSISTS/RECURS *DO NOT EXCEED 100MG  IN 24 HOURS**   tadalafil 5 MG tablet Commonly known as: CIALIS Take 1 tablet (5 mg total) by mouth daily as needed for erectile dysfunction.   tamsulosin 0.4 MG Caps capsule Commonly known as: FLOMAX Take 1 capsule (0.4 mg total) by mouth daily.   triamcinolone 0.025 % cream Commonly known as: KENALOG Apply 1 application topically 2 (two) times daily.   vitamin C 250 MG tablet Commonly known as: ASCORBIC ACID Take 250 mg by mouth daily.   Vitamin D (Ergocalciferol) 1.25 MG (50000 UNIT) Caps capsule Commonly known as: DRISDOL TAKE ONE CAPSULE (50,000 UNITS TOTAL) BY MOUTH EVERY SEVEN (SEVEN) DAYS.   Zinc 100 MG Tabs Take by mouth.        Allergies:  Allergies  Allergen Reactions   Penicillins     Passed out Did it involve swelling of the face/tongue/throat, SOB, or low BP? Yes Did it involve sudden or severe rash/hives, skin peeling, or any reaction on the inside of your mouth or nose? No Did you need to seek medical attention at a hospital or doctor's  office? No When did it last happen?      30 + years If all above answers are "NO", may proceed with cephalosporin use.     Family History: Family History  Problem Relation Age of Onset   Early death Father    Kidney disease Father    Heart failure Sister     Social History:  reports that he quit smoking about 38 years ago. His smoking use included cigarettes. He started smoking about 48 years ago. He has a 15 pack-year smoking history. He has never used smokeless tobacco. He reports that he does not  currently use alcohol. He reports that he does not use drugs.   Physical Exam: BP 138/74   Pulse 80   Ht 4\' 11"  (1.499 m)   Wt 208 lb (94.3 kg)   BMI 42.01 kg/m   Constitutional:  Well nourished. Alert and oriented, No acute distress. HEENT: [place] AT, moist mucus membranes.  Trachea midline Cardiovascular: No clubbing, cyanosis, or edema. Respiratory: Normal respiratory effort, no increased work of breathing. Neurologic: Grossly intact, no focal deficits, moving all 4 extremities. Psychiatric: Normal mood and affect.   Laboratory Data: Results for orders placed or performed in visit on 03/31/23  CMP14+EGFR  Result Value Ref Range   Glucose 98 70 - 99 mg/dL   BUN 19 8 - 27 mg/dL   Creatinine, Ser 4.09 0.76 - 1.27 mg/dL   eGFR 85 >81 XB/JYN/8.29   BUN/Creatinine Ratio 20 10 - 24   Sodium 144 134 - 144 mmol/L   Potassium 4.3 3.5 - 5.2 mmol/L   Chloride 107 (H) 96 - 106 mmol/L   CO2 22 20 - 29 mmol/L   Calcium 9.0 8.6 - 10.2 mg/dL   Total Protein 6.5 6.0 - 8.5 g/dL   Albumin 4.3 3.9 - 4.9 g/dL   Globulin, Total 2.2 1.5 - 4.5 g/dL   Bilirubin Total 0.3 0.0 - 1.2 mg/dL   Alkaline Phosphatase 67 44 - 121 IU/L   AST 15 0 - 40 IU/L   ALT 32 0 - 44 IU/L  Hemoglobin A1c  Result Value Ref Range   Hgb A1c MFr Bld 5.7 (H) 4.8 - 5.6 %   Est. average glucose Bld gHb Est-mCnc 117 mg/dL  Insulin, random  Result Value Ref Range   INSULIN 19.0 2.6 - 24.9 uIU/mL  Lipid Panel With  LDL/HDL Ratio  Result Value Ref Range   Cholesterol, Total 117 100 - 199 mg/dL   Triglycerides 81 0 - 149 mg/dL   HDL 34 (L) >56 mg/dL   VLDL Cholesterol Cal 16 5 - 40 mg/dL   LDL Chol Calc (NIH) 67 0 - 99 mg/dL   LDL/HDL Ratio 2.0 0.0 - 3.6 ratio  VITAMIN D 25 Hydroxy (Vit-D Deficiency, Fractures)  Result Value Ref Range   Vit D, 25-Hydroxy 69.6 30.0 - 100.0 ng/mL  Vitamin B12  Result Value Ref Range   Vitamin B-12 511 232 - 1,245 pg/mL  CBC with Differential/Platelet  Result Value Ref Range   WBC 8.1 3.4 - 10.8 x10E3/uL   RBC 4.88 4.14 - 5.80 x10E6/uL   Hemoglobin 14.4 13.0 - 17.7 g/dL   Hematocrit 21.3 08.6 - 51.0 %   MCV 91 79 - 97 fL   MCH 29.5 26.6 - 33.0 pg   MCHC 32.5 31.5 - 35.7 g/dL   RDW 57.8 46.9 - 62.9 %   Platelets 279 150 - 450 x10E3/uL   Neutrophils 69 Not Estab. %   Lymphs 17 Not Estab. %   Monocytes 9 Not Estab. %   Eos 3 Not Estab. %   Basos 1 Not Estab. %   Neutrophils Absolute 5.7 1.4 - 7.0 x10E3/uL   Lymphocytes Absolute 1.4 0.7 - 3.1 x10E3/uL   Monocytes Absolute 0.7 0.1 - 0.9 x10E3/uL   EOS (ABSOLUTE) 0.2 0.0 - 0.4 x10E3/uL   Basophils Absolute 0.1 0.0 - 0.2 x10E3/uL   Immature Granulocytes 1 Not Estab. %   Immature Grans (Abs) 0.0 0.0 - 0.1 x10E3/uL   I have reviewed the labs.   Assessment & Plan:    1.  T1c high risk prostate cancer PSA bump 0.11-0.4 since July 2023 Lab visit for follow-up PSA in 05/2023 -explained the importance of keeping the appointment, if PSA continues to rise, it is an indication that he has prostate cancer recurrence and that would need to be treated If follow-up PSA stable 1 year follow-up office visit PSMA/PET for rising PSA Tamsulosin refilled  2. Erectile dysfunction -At goal with tadalafil 5 mg daily with augmentation with up to 3 tablets for intercourse  Return for keep appointment in September .    Cloretta Ned  [facility] [address] [place], [place]  [address] [phone]

## 2023-04-13 ENCOUNTER — Encounter: Payer: Self-pay | Admitting: Urology

## 2023-04-13 ENCOUNTER — Ambulatory Visit: Payer: PPO | Admitting: Urology

## 2023-04-13 VITALS — BP 138/74 | HR 80 | Ht 59.0 in | Wt 208.0 lb

## 2023-04-13 DIAGNOSIS — C61 Malignant neoplasm of prostate: Secondary | ICD-10-CM

## 2023-04-13 DIAGNOSIS — N529 Male erectile dysfunction, unspecified: Secondary | ICD-10-CM

## 2023-04-13 MED ORDER — TADALAFIL 5 MG PO TABS
5.0000 mg | ORAL_TABLET | Freq: Every day | ORAL | 3 refills | Status: DC | PRN
Start: 2023-04-13 — End: 2024-02-18

## 2023-04-28 ENCOUNTER — Ambulatory Visit (INDEPENDENT_AMBULATORY_CARE_PROVIDER_SITE_OTHER): Payer: PPO | Admitting: Physician Assistant

## 2023-05-04 ENCOUNTER — Ambulatory Visit (INDEPENDENT_AMBULATORY_CARE_PROVIDER_SITE_OTHER): Payer: PPO | Admitting: Physician Assistant

## 2023-05-04 ENCOUNTER — Encounter (INDEPENDENT_AMBULATORY_CARE_PROVIDER_SITE_OTHER): Payer: Self-pay | Admitting: Physician Assistant

## 2023-05-04 VITALS — BP 112/69 | HR 70 | Temp 98.2°F | Ht 59.0 in | Wt 205.0 lb

## 2023-05-04 DIAGNOSIS — R7303 Prediabetes: Secondary | ICD-10-CM | POA: Diagnosis not present

## 2023-05-04 DIAGNOSIS — E785 Hyperlipidemia, unspecified: Secondary | ICD-10-CM

## 2023-05-04 DIAGNOSIS — E7849 Other hyperlipidemia: Secondary | ICD-10-CM

## 2023-05-04 DIAGNOSIS — E669 Obesity, unspecified: Secondary | ICD-10-CM

## 2023-05-04 DIAGNOSIS — Z6841 Body Mass Index (BMI) 40.0 and over, adult: Secondary | ICD-10-CM

## 2023-05-04 DIAGNOSIS — R600 Localized edema: Secondary | ICD-10-CM | POA: Diagnosis not present

## 2023-05-04 DIAGNOSIS — E559 Vitamin D deficiency, unspecified: Secondary | ICD-10-CM

## 2023-05-04 MED ORDER — METFORMIN HCL 500 MG PO TABS
500.0000 mg | ORAL_TABLET | Freq: Two times a day (BID) | ORAL | 0 refills | Status: DC
Start: 2023-05-04 — End: 2023-06-01

## 2023-05-04 MED ORDER — VITAMIN D (ERGOCALCIFEROL) 1.25 MG (50000 UNIT) PO CAPS: ORAL_CAPSULE | ORAL | 0 refills | Status: DC

## 2023-05-04 MED ORDER — HYDROCHLOROTHIAZIDE 12.5 MG PO CAPS
ORAL_CAPSULE | ORAL | 0 refills | Status: DC
Start: 2023-05-04 — End: 2023-06-01

## 2023-05-04 NOTE — Progress Notes (Signed)
.smr  Office: [phone]  /  Fax: [phone]  WEIGHT SUMMARY AND BIOMETRICS  Vitals Temp: 98.2 F (36.8 C) BP: 112/69 Pulse Rate: 70 SpO2: 96 %   Anthropometric Measurements Height: 4\' 11"  (1.499 m) (re checked height today) Weight: 205 lb (93 kg) BMI (Calculated): 41.38 Weight at Last Visit: 208 lb Weight Lost Since Last Visit: 3 lb Weight Gained Since Last Visit: 0 Starting Weight: 229 lb Total Weight Loss (lbs): 24 lb (10.9 kg) Peak Weight: 238 lb   Body Composition  Body Fat %: 37.7 % Fat Mass (lbs): 77.4 lbs Muscle Mass (lbs): 121.6 lbs Total Body Water (lbs): 95.2 lbs Visceral Fat Rating : 26   Other Clinical Data Fasting: no Labs: no Today's Visit #: 16 Starting Date: 06/10/22     HPI  Chief Complaint: OBESITY  Preston Thomas is here to discuss his progress with his obesity treatment plan. He is on the following a lower carbohydrate, vegetable and lean protein rich diet plan and states he is following his eating plan approximately 98 % of the time. He states he is exercising walking with ankle weights 45 minutes 3-4 times per week.  Discussed the use of AI scribe software for clinical note transcription with the patient, who gave verbal consent to proceed.  History of Present Illness     Preston Thomas, a 72 year old patient with a history of obesity, prediabetes, hyperlipidemia, vitamin D deficiency, low serum vitamin B12 level, and lower extremity edema, presents for a follow-up visit. He has been actively managing his weight and has seen significant improvements. He reports incorporating ankle weights into his exercise routine, which initially made his workouts more challenging but he has since adapted and can now exercise for 45 minutes with the weights. He has lost 24 pounds and has noticed a decrease in his adipose mass, particularly around his midsection. He reports feeling better overall and has noticed improvements in his clothes fitting. He has been  compliant with his medication regimen, which includes a fluid pill for his lower extremity edema and metformin for his prediabetes. He reports no issues with these medications. He also takes vitamin D supplements weekly for his vitamin D deficiency.       Pharmacotherapy: metformin 500 mg daily for primary indication of prediabetes. No Gi or other side effects with metformin.   TREATMENT PLAN FOR OBESITY: Obesity Significant progress with weight loss regimen, including exercise with ankle weights and mindful eating. Lost 24 pounds, approximately 10% of body weight. -Continue current regimen. -Return for follow-up in one month.  Recommended Dietary Goals  [name] is currently in the action stage of change. As such, his goal is to continue weight management plan. He has agreed to following a lower carbohydrate, vegetable and lean protein rich diet plan.  Behavioral Intervention  We discussed the following Behavioral Modification Strategies today: increasing lean protein intake, decreasing simple carbohydrates , increasing vegetables, increasing lower glycemic fruits, increasing fiber rich foods, increasing water intake, continue to practice mindfulness when eating, and planning for success.  Additional resources provided today: NA  Recommended Physical Activity Goals  [name] has been advised to work up to 150 minutes of moderate intensity aerobic activity a week and strengthening exercises 2-3 times per week for cardiovascular health, weight loss maintenance and preservation of muscle mass.   He has agreed to Continue current level of physical activity    Pharmacotherapy We discussed various medication options to help [name] with his weight loss efforts and we both agreed to increase  metformin to 500 mg twice daily for prediabetes.    Return in about 4 weeks (around 06/01/2023).[name] He was informed of the importance of frequent follow up visits to maximize his success with intensive  lifestyle modifications for his multiple health conditions.  PHYSICAL EXAM:  Blood pressure 112/69, pulse 70, temperature 98.2 F (36.8 C), height 4\' 11"  (1.499 m), weight 205 lb (93 kg), SpO2 96%. Body mass index is 41.4 kg/m.  General: He is overweight, cooperative, alert, well developed, and in no acute distress. PSYCH: Has normal mood, affect and thought process.   Cardiovascular: HR 70's regular, BP 112/69 Lungs: Normal breathing effort, no conversational dyspnea. Neuro: no focal deficits  DIAGNOSTIC DATA REVIEWED:  BMET    Component Value Date/Time   NA 144 03/31/2023 0918   K 4.3 03/31/2023 0918   CL 107 (H) 03/31/2023 0918   CO2 22 03/31/2023 0918   GLUCOSE 98 03/31/2023 0918   GLUCOSE 95 03/27/2022 1522   BUN 19 03/31/2023 0918   CREATININE 0.97 03/31/2023 0918   CREATININE 1.14 03/27/2022 1522   CALCIUM 9.0 03/31/2023 0918   GFRNONAA >60 05/17/2019 0906   GFRAA >60 05/17/2019 0906   Lab Results  Component Value Date   HGBA1C 5.7 (H) 03/31/2023   HGBA1C 6.1 09/02/2020   Lab Results  Component Value Date   INSULIN 19.0 03/31/2023   INSULIN 34.2 (H) 06/10/2022   Lab Results  Component Value Date   TSH 2.420 06/10/2022   CBC    Component Value Date/Time   WBC 8.1 03/31/2023 0918   WBC 8.2 03/27/2022 1522   RBC 4.88 03/31/2023 0918   RBC 4.81 03/27/2022 1522   HGB 14.4 03/31/2023 0918   HCT 44.3 03/31/2023 0918   PLT 279 03/31/2023 0918   MCV 91 03/31/2023 0918   MCH 29.5 03/31/2023 0918   MCH 29.5 03/27/2022 1522   MCHC 32.5 03/31/2023 0918   MCHC 32.6 03/27/2022 1522   RDW 13.4 03/31/2023 0918   Iron Studies    Component Value Date/Time   FERRITIN 986 (H) 04/26/2019 0205   Lipid Panel     Component Value Date/Time   CHOL 117 03/31/2023 0918   TRIG 81 03/31/2023 0918   HDL 34 (L) 03/31/2023 0918   CHOLHDL 4 12/19/2021 1246   VLDL 31.8 12/19/2021 1246   LDLCALC 67 03/31/2023 0918   LDLDIRECT 167.0 05/15/2019 1100   Hepatic Function  Panel     Component Value Date/Time   PROT 6.5 03/31/2023 0918   ALBUMIN 4.3 03/31/2023 0918   AST 15 03/31/2023 0918   ALT 32 03/31/2023 0918   ALKPHOS 67 03/31/2023 0918   BILITOT 0.3 03/31/2023 0918      Component Value Date/Time   TSH 2.420 06/10/2022 1213   Nutritional Lab Results  Component Value Date   VD25OH 69.6 03/31/2023   VD25OH 50.7 10/29/2022   VD25OH 35.8 06/10/2022    ASSOCIATED CONDITIONS ADDRESSED TODAY  ASSESSMENT AND PLAN  Problem List Items Addressed This Visit     Prediabetes - Primary   Relevant Medications   metFORMIN (GLUCOPHAGE) 500 MG tablet   Vitamin D deficiency   Relevant Medications   Vitamin D, Ergocalciferol, (DRISDOL) 1.25 MG (50000 UNIT) CAPS capsule   Obesity (HCC)- start BMI 44.92   Relevant Medications   metFORMIN (GLUCOPHAGE) 500 MG tablet   BMI 40.0-44.9, adult (HCC)  Current BMI 43.1   Relevant Medications   metFORMIN (GLUCOPHAGE) 500 MG tablet   Lower leg edema  Relevant Medications   hydrochlorothiazide (MICROZIDE) 12.5 MG capsule     Prediabetes Labs were reviewed today and discussed with the patient.  Decreased A1C and insulin levels, indicating improved glycemic control. Currently on Metformin 500mg  daily. -Increase Metformin to 500mg  twice daily with meals. -Recheck labs in 3-4 months.  Hyperlipidemia Labs were reviewed today and discussed with the patient.  Improved lipid profile, likely secondary to weight loss and exercise. -Continue current lifestyle modifications. Continue Lipitor 20 mg daily. No side effects with Lipitor  Vitamin D deficiency Therapeutic Vitamin D level achieved on weekly supplementation. -Reduce Vitamin D supplementation to every other week. Refilled Ergocalciferol today.  -Recheck levels in 3-4 months.  Vitamin B12 deficiency Vitamin B 12 level 511. On B 12 500 mcg daily. No side effects.  -Continue current management.  Lower extremity edema Improvement reported with low-dose  diuretic therapy every other day. -Continue current regimen with hydrochlorothiazide 12.5 mg every other day- Refilled today.  -Recheck at next visit.  General Health Maintenance -Annual wellness visit scheduled for December. -PSA check scheduled for September.   Prediabetes Last A1c was 5.7- not at goal, but much improved. Insulin 19.0- much improved but not at goal.   Medication(s): Metformin 500 mg once daily breakfast No Gi or other side effects with metformin.  Polyphagia:No Lab Results  Component Value Date   HGBA1C 5.7 (H) 03/31/2023   HGBA1C 5.8 (H) 10/29/2022   HGBA1C 6.2 (H) 06/10/2022   HGBA1C 5.8 (A) 12/19/2021   HGBA1C 6.1 (A) 03/17/2021   Lab Results  Component Value Date   INSULIN 19.0 03/31/2023   INSULIN 34.2 (H) 06/10/2022    Plan: Continue and increase dose Metformin 500 mg twice daily with meals Continue working on nutrition plan to decrease simple carbohydrates, increase lean proteins and exercise to promote weight loss, improve glycemic control and prevent progression to Type 2 diabetes.    ATTESTASTION STATEMENTS:  Reviewed by clinician on day of visit: allergies, medications, problem list, medical history, surgical history, family history, social history, and previous encounter notes.   I have personally spent 35 minutes total time today in preparation, patient care, nutritional counseling and documentation for this visit, including the following: review of clinical lab tests; review of medical tests/procedures/services.       , PA-C

## 2023-06-01 ENCOUNTER — Encounter (INDEPENDENT_AMBULATORY_CARE_PROVIDER_SITE_OTHER): Payer: Self-pay | Admitting: Physician Assistant

## 2023-06-01 ENCOUNTER — Ambulatory Visit (INDEPENDENT_AMBULATORY_CARE_PROVIDER_SITE_OTHER): Payer: PPO | Admitting: Physician Assistant

## 2023-06-01 VITALS — BP 116/73 | HR 72 | Temp 97.8°F | Ht 59.0 in | Wt 204.0 lb

## 2023-06-01 DIAGNOSIS — E669 Obesity, unspecified: Secondary | ICD-10-CM | POA: Diagnosis not present

## 2023-06-01 DIAGNOSIS — R7303 Prediabetes: Secondary | ICD-10-CM

## 2023-06-01 DIAGNOSIS — E785 Hyperlipidemia, unspecified: Secondary | ICD-10-CM

## 2023-06-01 DIAGNOSIS — R600 Localized edema: Secondary | ICD-10-CM | POA: Diagnosis not present

## 2023-06-01 DIAGNOSIS — E7849 Other hyperlipidemia: Secondary | ICD-10-CM

## 2023-06-01 DIAGNOSIS — Z6841 Body Mass Index (BMI) 40.0 and over, adult: Secondary | ICD-10-CM

## 2023-06-01 DIAGNOSIS — E559 Vitamin D deficiency, unspecified: Secondary | ICD-10-CM

## 2023-06-01 MED ORDER — METFORMIN HCL 500 MG PO TABS
500.0000 mg | ORAL_TABLET | Freq: Two times a day (BID) | ORAL | 0 refills | Status: AC
Start: 2023-06-01 — End: 2023-07-01

## 2023-06-01 MED ORDER — HYDROCHLOROTHIAZIDE 12.5 MG PO CAPS
ORAL_CAPSULE | ORAL | 0 refills | Status: AC
Start: 2023-06-01 — End: ?

## 2023-06-01 MED ORDER — VITAMIN D (ERGOCALCIFEROL) 1.25 MG (50000 UNIT) PO CAPS
ORAL_CAPSULE | ORAL | 0 refills | Status: AC
Start: 2023-06-01 — End: ?

## 2023-06-01 NOTE — Progress Notes (Addendum)
.smr  Office: [phone]  /  Fax: [phone]  WEIGHT SUMMARY AND BIOMETRICS  Vitals Temp: 97.8 F (36.6 C) BP: 116/73 Pulse Rate: 72 SpO2: 96 %   Anthropometric Measurements Height: 4\' 11"  (1.499 m) Weight: 204 lb (92.5 kg) BMI (Calculated): 41.18 Weight at Last Visit: 205 lb Weight Lost Since Last Visit: 1 lb Weight Gained Since Last Visit: 0 Starting Weight: 229 lb Total Weight Loss (lbs): 25 lb (11.3 kg) Peak Weight: 238 lb   Body Composition  Body Fat %: 37.5 % Fat Mass (lbs): 76.8 lbs Muscle Mass (lbs): 121.4 lbs Total Body Water (lbs): 94.2 lbs Visceral Fat Rating : 26   Other Clinical Data Fasting: yes Labs: no Today's Visit #: 17 Starting Date: 06/10/22     HPI  Chief Complaint: OBESITY  Preston Thomas is here to discuss his progress with his obesity treatment plan. He is on the following a lower carbohydrate, vegetable and lean protein rich diet plan and states he is following his eating plan approximately 98 % of the time. He states he is exercising walking with ankle weights 45 minutes 3-4 times per week.  Discussed the use of AI scribe software for clinical note transcription with the patient, who gave verbal consent to proceed.  History of Present Illness /     Interval History:  Since last office visit he is down 1 lb since last visit.  Down 25 lbs since 06/10/2022 TBW loss of 10.92% ! Great work!    The patient, a 72 year old gentleman with a history of obesity, prediabetes, and lower extremity edema, presents for a follow-up visit. He reports feeling different and better since the last visit, indicating progress in his weight loss journey. He has been adhering to his medication regimen, which includes metformin, hydrochlorothiazide, and vitamin D and B12 supplements. He has not experienced any adverse effects from the metformin.  The patient has been actively engaged in physical activities, including walking every three to four days with weights  and playing golf once a week. He has also incorporated more walking into his daily routine, opting for stairs over elevators. He reports noticeable improvements in his physical capabilities, such as being able to walk up and down stairs without getting out of breath.  Despite the progress, the patient acknowledges the need to continue with his weight loss efforts. He expresses a desire to lose at least the same amount of weight in the next year and is open to trying new strategies, such as wearing a weighted vest during his walks.     Pharmacotherapy: metformin 500 mg increased to twice daily 05/04/23-for primary indication of prediabetes. No Gi or other side effects with metformin.   TREATMENT PLAN FOR OBESITY: Obesity Significant progress with weight loss, improved lipid profile, and decreased insulin levels. Patient is adhering to a low-carb diet and has increased physical activity, including walking with weights and increased stair use. -Continue current regimen and consider adding a weighted vest for increased resistance during physical activity. Down 25 lbs TBW loss of 10.92%  Recommended Dietary Goals  Wandell is currently in the action stage of change. As such, his goal is to continue weight management plan. He has agreed to following a lower carbohydrate, vegetable and lean protein rich diet plan.  Behavioral Intervention  We discussed the following Behavioral Modification Strategies today: increasing lean protein intake, decreasing simple carbohydrates , increasing vegetables, increasing lower glycemic fruits, increasing fiber rich foods, increasing water intake, continue to practice mindfulness when eating, and planning  for success.  Additional resources provided today: NA  Recommended Physical Activity Goals  Preston Thomas has been advised to work up to 150 minutes of moderate intensity aerobic activity a week and strengthening exercises 2-3 times per week for cardiovascular health,  weight loss maintenance and preservation of muscle mass.   He has agreed to Continue current level of physical activity  and think about adding in a weighted vest when walking .    Pharmacotherapy We discussed various medication options to help Preston Thomas Most with his weight loss efforts and we both agreed to continue metformin 500 mg twice daily for primary indication of prediabetes.    Return in about 4 weeks (around 06/29/2023).Preston Thomas He was informed of the importance of frequent follow up visits to maximize his success with intensive lifestyle modifications for his multiple health conditions.  PHYSICAL EXAM:  Blood pressure 116/73, pulse 72, temperature 97.8 F (36.6 C), height 4\' 11"  (1.499 m), weight 204 lb (92.5 kg), SpO2 96%. Body mass index is 41.2 kg/m.  General: He is overweight, cooperative, alert, well developed, and in no acute distress. PSYCH: Has normal mood, affect and thought process.   Cardiovascular: HR 70's regular., BP 116/72 Lungs: Normal breathing effort, no conversational dyspnea. Neuro: no focal deficits  DIAGNOSTIC DATA REVIEWED:  BMET    Component Value Date/Time   NA 144 03/31/2023 0918   K 4.3 03/31/2023 0918   CL 107 (H) 03/31/2023 0918   CO2 22 03/31/2023 0918   GLUCOSE 98 03/31/2023 0918   GLUCOSE 95 03/27/2022 1522   BUN 19 03/31/2023 0918   CREATININE 0.97 03/31/2023 0918   CREATININE 1.14 03/27/2022 1522   CALCIUM 9.0 03/31/2023 0918   GFRNONAA >60 05/17/2019 0906   GFRAA >60 05/17/2019 0906   Lab Results  Component Value Date   HGBA1C 5.7 (H) 03/31/2023   HGBA1C 6.1 09/02/2020   Lab Results  Component Value Date   INSULIN 19.0 03/31/2023   INSULIN 34.2 (H) 06/10/2022   Lab Results  Component Value Date   TSH 2.420 06/10/2022   CBC    Component Value Date/Time   WBC 8.1 03/31/2023 0918   WBC 8.2 03/27/2022 1522   RBC 4.88 03/31/2023 0918   RBC 4.81 03/27/2022 1522   HGB 14.4 03/31/2023 0918   HCT 44.3 03/31/2023 0918   PLT 279  03/31/2023 0918   MCV 91 03/31/2023 0918   MCH 29.5 03/31/2023 0918   MCH 29.5 03/27/2022 1522   MCHC 32.5 03/31/2023 0918   MCHC 32.6 03/27/2022 1522   RDW 13.4 03/31/2023 0918   Iron Studies    Component Value Date/Time   FERRITIN 986 (H) 04/26/2019 0205   Lipid Panel     Component Value Date/Time   CHOL 117 03/31/2023 0918   TRIG 81 03/31/2023 0918   HDL 34 (L) 03/31/2023 0918   CHOLHDL 4 12/19/2021 1246   VLDL 31.8 12/19/2021 1246   LDLCALC 67 03/31/2023 0918   LDLDIRECT 167.0 05/15/2019 1100   Hepatic Function Panel     Component Value Date/Time   PROT 6.5 03/31/2023 0918   ALBUMIN 4.3 03/31/2023 0918   AST 15 03/31/2023 0918   ALT 32 03/31/2023 0918   ALKPHOS 67 03/31/2023 0918   BILITOT 0.3 03/31/2023 0918      Component Value Date/Time   TSH 2.420 06/10/2022 1213   Nutritional Lab Results  Component Value Date   VD25OH 69.6 03/31/2023   VD25OH 50.7 10/29/2022   VD25OH 35.8 06/10/2022    ASSOCIATED CONDITIONS  ADDRESSED TODAY  ASSESSMENT AND PLAN  Problem List Items Addressed This Visit     Hyperlipidemia   Relevant Medications   hydrochlorothiazide (MICROZIDE) 12.5 MG capsule   Prediabetes - Primary   Relevant Medications   metFORMIN (GLUCOPHAGE) 500 MG tablet   Vitamin D deficiency   Relevant Medications   Vitamin D, Ergocalciferol, (DRISDOL) 1.25 MG (50000 UNIT) CAPS capsule   Obesity (HCC)- start BMI 44.92   Relevant Medications   metFORMIN (GLUCOPHAGE) 500 MG tablet   BMI 40.0-44.9, adult (HCC)  Current BMI 43.1   Relevant Medications   metFORMIN (GLUCOPHAGE) 500 MG tablet   Lower leg edema   Relevant Medications   hydrochlorothiazide (MICROZIDE) 12.5 MG capsule   Prediabetes Improved control with Metformin twice daily, A1c down to 5.7. -Continue Metformin 500 mg twice daily.  Hyperlipidemia LDL and triglycerides well controlled on Lipitor. HDL remains low, but patient is doing all possible lifestyle modifications. -Continue  Lipitor.  Vitamin D deficiency Improved levels on weekly supplementation. -Reduce Vitamin D prescription to every other week.  Lower extremity edema Improvement noted on Hydrochlorothiazide. -Continue Hydrochlorothiazide every other day for leg swelling.  Follow-up in 1 month. Will plan to recheck labs in 2-3 months.  ATTESTASTION STATEMENTS:  Reviewed by clinician on day of visit: allergies, medications, problem list, medical history, surgical history, family history, social history, and previous encounter notes.   I have personally spent 30 minutes total time today in preparation, patient care, nutritional counseling and documentation for this visit, including the following: review of clinical lab tests; review of medical tests/procedures/services.      Jakara Blatter, PA-C

## 2023-06-11 ENCOUNTER — Other Ambulatory Visit: Payer: PPO

## 2023-06-16 ENCOUNTER — Other Ambulatory Visit: Payer: PPO

## 2023-06-16 DIAGNOSIS — C61 Malignant neoplasm of prostate: Secondary | ICD-10-CM | POA: Diagnosis not present

## 2023-06-17 LAB — PSA: Prostate Specific Ag, Serum: 0.7 ng/mL (ref 0.0–4.0)

## 2023-06-24 DIAGNOSIS — Z961 Presence of intraocular lens: Secondary | ICD-10-CM | POA: Diagnosis not present

## 2023-06-24 DIAGNOSIS — H26493 Other secondary cataract, bilateral: Secondary | ICD-10-CM | POA: Diagnosis not present

## 2023-07-01 ENCOUNTER — Encounter (INDEPENDENT_AMBULATORY_CARE_PROVIDER_SITE_OTHER): Payer: Self-pay | Admitting: Physician Assistant

## 2023-07-01 ENCOUNTER — Ambulatory Visit (INDEPENDENT_AMBULATORY_CARE_PROVIDER_SITE_OTHER): Payer: PPO | Admitting: Physician Assistant

## 2023-07-01 VITALS — BP 127/79 | HR 71 | Temp 98.6°F | Ht 59.0 in | Wt 204.0 lb

## 2023-07-01 DIAGNOSIS — Z6841 Body Mass Index (BMI) 40.0 and over, adult: Secondary | ICD-10-CM | POA: Diagnosis not present

## 2023-07-01 DIAGNOSIS — E669 Obesity, unspecified: Secondary | ICD-10-CM | POA: Diagnosis not present

## 2023-07-01 DIAGNOSIS — E785 Hyperlipidemia, unspecified: Secondary | ICD-10-CM | POA: Diagnosis not present

## 2023-07-01 DIAGNOSIS — E559 Vitamin D deficiency, unspecified: Secondary | ICD-10-CM

## 2023-07-01 DIAGNOSIS — R600 Localized edema: Secondary | ICD-10-CM

## 2023-07-01 DIAGNOSIS — R7303 Prediabetes: Secondary | ICD-10-CM

## 2023-07-01 DIAGNOSIS — E7849 Other hyperlipidemia: Secondary | ICD-10-CM

## 2023-07-01 MED ORDER — METFORMIN HCL 500 MG PO TABS
500.0000 mg | ORAL_TABLET | Freq: Two times a day (BID) | ORAL | 0 refills | Status: DC
Start: 2023-07-01 — End: 2023-07-29

## 2023-07-01 MED ORDER — VITAMIN D (ERGOCALCIFEROL) 1.25 MG (50000 UNIT) PO CAPS
ORAL_CAPSULE | ORAL | 0 refills | Status: DC
Start: 2023-07-01 — End: 2023-07-29

## 2023-07-01 MED ORDER — HYDROCHLOROTHIAZIDE 12.5 MG PO CAPS
ORAL_CAPSULE | ORAL | 0 refills | Status: DC
Start: 2023-07-01 — End: 2023-07-29
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
## 2023-07-09 ENCOUNTER — Ambulatory Visit: Payer: PPO | Admitting: Urology

## 2023-07-15 DIAGNOSIS — G4733 Obstructive sleep apnea (adult) (pediatric): Secondary | ICD-10-CM | POA: Diagnosis not present

## 2023-07-29 ENCOUNTER — Ambulatory Visit (INDEPENDENT_AMBULATORY_CARE_PROVIDER_SITE_OTHER): Payer: PPO | Admitting: Physician Assistant

## 2023-07-29 ENCOUNTER — Encounter (INDEPENDENT_AMBULATORY_CARE_PROVIDER_SITE_OTHER): Payer: Self-pay | Admitting: Physician Assistant

## 2023-07-29 VITALS — BP 125/77 | HR 72 | Temp 98.6°F | Ht 59.0 in | Wt 202.0 lb

## 2023-07-29 DIAGNOSIS — E669 Obesity, unspecified: Secondary | ICD-10-CM

## 2023-07-29 DIAGNOSIS — R7303 Prediabetes: Secondary | ICD-10-CM | POA: Diagnosis not present

## 2023-07-29 DIAGNOSIS — Z6841 Body Mass Index (BMI) 40.0 and over, adult: Secondary | ICD-10-CM | POA: Diagnosis not present

## 2023-07-29 DIAGNOSIS — R600 Localized edema: Secondary | ICD-10-CM

## 2023-07-29 DIAGNOSIS — E559 Vitamin D deficiency, unspecified: Secondary | ICD-10-CM | POA: Diagnosis not present

## 2023-07-29 MED ORDER — HYDROCHLOROTHIAZIDE 12.5 MG PO CAPS: ORAL_CAPSULE | ORAL | 0 refills | Status: DC

## 2023-07-29 MED ORDER — METFORMIN HCL 500 MG PO TABS
500.0000 mg | ORAL_TABLET | Freq: Two times a day (BID) | ORAL | 0 refills | Status: DC
Start: 2023-07-29 — End: 2023-09-01

## 2023-07-29 MED ORDER — VITAMIN D (ERGOCALCIFEROL) 1.25 MG (50000 UNIT) PO CAPS: ORAL_CAPSULE | ORAL | 0 refills | Status: DC

## 2023-07-29 NOTE — Progress Notes (Signed)
.smr  Office: [phone]  /  Fax: [phone]  WEIGHT SUMMARY AND BIOMETRICS  Vitals Temp: 98.6 F (37 C) BP: 125/77 Pulse Rate: 72 SpO2: 97 %   Anthropometric Measurements Height: 4\' 11"  (1.499 m) Weight: 202 lb (91.6 kg) BMI (Calculated): 40.78 Weight at Last Visit: 204 lb Weight Lost Since Last Visit: 2 lb Weight Gained Since Last Visit: 0 Starting Weight: 229 lb Total Weight Loss (lbs): 27 lb (12.2 kg) Peak Weight: 238 lb   Body Composition  Body Fat %: 37 % Fat Mass (lbs): 74.8 lbs Muscle Mass (lbs): 120.8 lbs Total Body Water (lbs): 93.6 lbs Visceral Fat Rating : 25   Other Clinical Data Fasting: no Labs: no Today's Visit #: 19 Starting Date: 06/10/22     HPI  Chief Complaint: OBESITY  Renauld is here to discuss his progress with his obesity treatment plan. He is on the following a lower carbohydrate, vegetable and lean protein rich diet plan and states he is following his eating plan approximately 98 % of the time. He states he is exercising increased walking to 2 miles 45-60 minutes 3-4 times per week. Discussed the use of AI scribe software for clinical note transcription with the patient, who gave verbal consent to proceed.  History of Present Illness     Interval History:  Since last office visit he is down 2 lbs. Bio impedence scale reviewed with the patient:  Muscle mass + 0.8 lbs Adipose mass - 3.2 lbs  The patient, with a history of obesity, prediabetes, vitamin D deficiency, and lower extremity edema, presents for a follow-up visit regarding his obesity treatment plan.  Decrease in adipose tissue and an increase in muscle mass, as evidenced by his body composition analysis at today's visit.  He notes the only difference over the past few weeks is an increase in walking distance .  He continues to have strict adherence to the treatment plan.  He denies any issues with his current medications, which include metformin, vitamin D, and  hydrochlorothiazide.  The patient also [facility] more [facility] to his routine. He denies any significant changes in his diet, except for occasional guilt-free desserts from a local bakery. The patient also reports no issues with his leg swelling.   Pharmacotherapy: metformin 500 mg increased to twice daily 05/04/23-for primary indication of prediabetes. No Gi or other side effects with metformin   TREATMENT PLAN FOR OBESITY: Down a total of 27 lbs !! TBW loss of 11.8% !!  Significant progress in weight loss and body composition changes, with an increase in muscle mass and decrease in adipose tissue. The patient has been adhering to the plan and has increased walking distance. -Continue current exercise and dietary regimen. -Consider adding weight training for further muscle mass development. Recommended Dietary Goals  Santiago is currently in the action stage of change. As such, his goal is to continue weight management plan. He has agreed to following a lower carbohydrate, vegetable and lean protein rich diet plan.  Behavioral Intervention  We discussed the following Behavioral Modification Strategies today: continue to work on maintaining a reduced calorie state, getting the recommended amount of protein, incorporating whole foods, making healthy choices, staying well hydrated and practicing mindfulness when eating..  Additional resources provided today: NA  Recommended Physical Activity Goals  Laquintin has been advised to work up to 150 minutes of moderate intensity aerobic activity a week and strengthening exercises 2-3 times per week for  cardiovascular health, weight loss maintenance and preservation of muscle mass.   He has agreed to Think about enjoyable ways to increase daily physical activity and overcoming barriers to exercise and Increase physical activity in their day and reduce sedentary time (increase  NEAT).   Pharmacotherapy We discussed various medication options to help Leonette Most with his weight loss efforts and we both agreed to continue metformin for primary indication of prediabetes.    Return in about 5 weeks (around 09/02/2023) for Fasting Lab.[name] He was informed of the importance of frequent follow up visits to maximize his success with intensive lifestyle modifications for his multiple health conditions.  PHYSICAL EXAM:  Blood pressure 125/77, pulse 72, temperature 98.6 F (37 C), height 4\' 11"  (1.499 m), weight 202 lb (91.6 kg), SpO2 97%. Body mass index is 40.8 kg/m.  General: He is overweight, cooperative, alert, well developed, and in no acute distress. PSYCH: Has normal mood, affect and thought process.   Cardiovascular: HR 70's BP 125/77. No ankle edema today.  Lungs: Normal breathing effort, no conversational dyspnea.  DIAGNOSTIC DATA REVIEWED:  BMET    Component Value Date/Time   NA 144 03/31/2023 0918   K 4.3 03/31/2023 0918   CL 107 (H) 03/31/2023 0918   CO2 22 03/31/2023 0918   GLUCOSE 98 03/31/2023 0918   GLUCOSE 95 03/27/2022 1522   BUN 19 03/31/2023 0918   CREATININE 0.97 03/31/2023 0918   CREATININE 1.14 03/27/2022 1522   CALCIUM 9.0 03/31/2023 0918   GFRNONAA >60 05/17/2019 0906   GFRAA >60 05/17/2019 0906   Lab Results  Component Value Date   HGBA1C 5.7 (H) 03/31/2023   HGBA1C 6.1 09/02/2020   Lab Results  Component Value Date   INSULIN 19.0 03/31/2023   INSULIN 34.2 (H) 06/10/2022   Lab Results  Component Value Date   TSH 2.420 06/10/2022   CBC    Component Value Date/Time   WBC 8.1 03/31/2023 0918   WBC 8.2 03/27/2022 1522   RBC 4.88 03/31/2023 0918   RBC 4.81 03/27/2022 1522   HGB 14.4 03/31/2023 0918   HCT 44.3 03/31/2023 0918   PLT 279 03/31/2023 0918   MCV 91 03/31/2023 0918   MCH 29.5 03/31/2023 0918   MCH 29.5 03/27/2022 1522   MCHC 32.5 03/31/2023 0918   MCHC 32.6 03/27/2022 1522   RDW 13.4 03/31/2023 0918   Iron  Studies    Component Value Date/Time   FERRITIN 986 (H) 04/26/2019 0205   Lipid Panel     Component Value Date/Time   CHOL 117 03/31/2023 0918   TRIG 81 03/31/2023 0918   HDL 34 (L) 03/31/2023 0918   CHOLHDL 4 12/19/2021 1246   VLDL 31.8 12/19/2021 1246   LDLCALC 67 03/31/2023 0918   LDLDIRECT 167.0 05/15/2019 1100   Hepatic Function Panel     Component Value Date/Time   PROT 6.5 03/31/2023 0918   ALBUMIN 4.3 03/31/2023 0918   AST 15 03/31/2023 0918   ALT 32 03/31/2023 0918   ALKPHOS 67 03/31/2023 0918   BILITOT 0.3 03/31/2023 0918      Component Value Date/Time   TSH 2.420 06/10/2022 1213   Nutritional Lab Results  Component Value Date   VD25OH 69.6 03/31/2023   VD25OH 50.7 10/29/2022   VD25OH 35.8 06/10/2022    ASSOCIATED CONDITIONS ADDRESSED TODAY  ASSESSMENT AND PLAN  Problem List Items Addressed This Visit     Prediabetes - Primary   Relevant Medications   metFORMIN (GLUCOPHAGE) 500 MG tablet  Vitamin D deficiency   Relevant Medications   Vitamin D, Ergocalciferol, (DRISDOL) 1.25 MG (50000 UNIT) CAPS capsule   Obesity (HCC)- start BMI 44.92   Relevant Medications   metFORMIN (GLUCOPHAGE) 500 MG tablet   BMI 40.0-44.9, adult (HCC)  Current BMI 43.1   Relevant Medications   metFORMIN (GLUCOPHAGE) 500 MG tablet   Lower leg edema   Relevant Medications   hydrochlorothiazide (MICROZIDE) 12.5 MG capsule   Prediabetes Last A1c was 5.7- not at goal, but much improved. Insulin 19.0- not at goal  Medication(s): Metformin 500 mg twice daily with meals Polyphagia:No Lab Results  Component Value Date   HGBA1C 5.7 (H) 03/31/2023   HGBA1C 5.8 (H) 10/29/2022   HGBA1C 6.2 (H) 06/10/2022   HGBA1C 5.8 (A) 12/19/2021   HGBA1C 6.1 (A) 03/17/2021   Lab Results  Component Value Date   INSULIN 19.0 03/31/2023   INSULIN 34.2 (H) 06/10/2022    Plan: Continue and refill Metformin 500 mg twice daily with meals Continue working on nutrition plan to decrease  simple carbohydrates, increase lean proteins and exercise to promote weight loss, improve glycemic control and prevent progression to Type 2 diabetes.  Recheck fasting labs next visit.    Vitamin D Deficiency Vitamin D is at goal of 50.  Most recent vitamin D level was 69.6. He is on  prescription ergocalciferol 50,000 IU every 14 days. No N/V or muscle weakness with Ergocalciferol.  Lab Results  Component Value Date   VD25OH 69.6 03/31/2023   VD25OH 50.7 10/29/2022   VD25OH 35.8 06/10/2022    Plan: Continue and refill  prescription ergocalciferol 50,000 IU every 14 days Low vitamin D levels can be associated with adiposity and may result in leptin resistance and weight gain. Also associated with fatigue. Currently on vitamin D supplementation without any adverse effects.  Recheck vitamin D level next visit.   Lower extremity edema Improvement noted with no current swelling. On low dose hydrochlorothiazide 12.5 mg every other day.  -Continue/refill  Hydrochlorothiazide 12.5 mg every other day.  Recheck renal function and electrolytes with lab next visit.  General Health Maintenance / Followup Plans  -Follow up appointment with fasting labs on 08/02/2023 at 8:15 am.  ATTESTASTION STATEMENTS:  Reviewed by clinician on day of visit: allergies, medications, problem list, medical history, surgical history, family history, social history, and previous encounter notes.   I have personally spent 41 minutes total time today in preparation, patient care, nutritional counseling and documentation for this visit, including the following: review of clinical lab tests; review of medical tests/procedures/services.      Alys Dulak, PA-C

## 2023-07-30 ENCOUNTER — Ambulatory Visit: Payer: PPO | Admitting: Urology

## 2023-08-06 ENCOUNTER — Encounter: Payer: Self-pay | Admitting: Urology

## 2023-08-06 ENCOUNTER — Ambulatory Visit: Payer: PPO | Admitting: Urology

## 2023-08-06 VITALS — BP 105/66 | HR 80 | Ht 59.0 in | Wt 200.0 lb

## 2023-08-06 DIAGNOSIS — C61 Malignant neoplasm of prostate: Secondary | ICD-10-CM | POA: Diagnosis not present

## 2023-08-06 NOTE — Progress Notes (Signed)
I, Maysun Anabel Bene, acting as a Neurosurgeon for Riki Altes, MD., have documented all relevant documentation on the behalf of Riki Altes, MD, as directed by Riki Altes, MD while in the presence of Riki Altes, MD.  08/06/2023 4:32 PM   [name] January 23, 1951 [id number]  Referring provider: Doreene Nest, NP [address] [place],  [place] [address]  Chief Complaint  Patient presents with   Prostate Cancer   Urologic history: 1. cT1c prostate cancer-high risk Biopsy 10/2018, PSA 12.7, 28 g gland 10/12 cores positive Gleason 3+3 to 4+5 CT/bone scan negative for metastatic disease tx IMRT + brachytherapy + ADT; brachytherapy 05/2019  HPI: Preston Thomas is a 72 y.o. male presents for a follow-up visit  Had his March 2024 visit. PSA had increased to 0.4 Repeat PSA 06/16/2023 was 0.7 He has no voiding complaints and no complaints today.   PSA trend  Prostate Specific Ag, Serum  Latest Ref Rng 0.0 - 4.0 ng/mL  11/11/2018 12.7 (H)   02/19/2020 0.3   11/28/2021 <0.1   11/30/2022 0.4   06/16/2023 0.7     PMH: Past Medical History:  Diagnosis Date   Acute conjunctivitis of both eyes 02/20/2022   Bilateral lower extremity edema    Cancer (HCC)    COLONIC POLYPS, HX OF 08/31/2007   Qualifier: Diagnosis of  By: Alphonsus Sias MD, Ronnette Hila    DIVERTICULITIS, HX OF 08/31/2007   Qualifier: Diagnosis of  By: Alice Reichert CMA (AAMA), Natasha     DIVERTICULOSIS, COLON 08/31/2007   Qualifier: Diagnosis of  By: Alphonsus Sias MD, Ronnette Hila    Dyspnea    Related to Covid   Gallbladder problem    GERD (gastroesophageal reflux disease)    History of kidney stones    Hx of radiation therapy 04/11/2019   Pneumonia due to COVID-19 virus 04/19/2019   Prostate cancer (HCC)    RENAL CALCULUS, HX OF 08/31/2007   Qualifier: Diagnosis of  By: Alice Reichert CMA (AAMA), Natasha     Shortness of breath 02/21/2020   Sleep apnea    Swallowing difficulty     Surgical  History: Past Surgical History:  Procedure Laterality Date   APPENDECTOMY  1987   CHOLECYSTECTOMY  1987   RADIOACTIVE SEED IMPLANT N/A 06/27/2019   Procedure: RADIOACTIVE SEED IMPLANT/BRACHYTHERAPY IMPLANT;  Surgeon: Riki Altes, MD;  Location: [facility] ORS;  Service: Urology;  Laterality: N/A;    Home Medications:  Allergies as of 08/06/2023       Reactions   Penicillins    Passed out Did it involve swelling of the face/tongue/throat, SOB, or low BP? Yes Did it involve sudden or severe rash/hives, skin peeling, or any reaction on the inside of your mouth or nose? No Did you need to seek medical attention at a hospital or doctor's office? No When did it last happen?      30 + years If all above answers are "NO", may proceed with cephalosporin use.        Medication List        Accurate as of August 06, 2023  4:32 PM. If you have any questions, ask your nurse or doctor.          atorvastatin 20 MG tablet Commonly known as: LIPITOR Take 1 tablet (20 mg total) by mouth daily. for cholesterol   clobetasol ointment 0.05 % Commonly known as: TEMOVATE Apply 1 application topically 2 (two) times daily. What  changed: additional instructions   cyanocobalamin 500 MCG tablet Commonly known as: VITAMIN B12 Take 500 mcg by mouth daily.   fluocinonide 0.05 % external solution Commonly known as: LIDEX Apply 1 application topically 2 (two) times daily. What changed: additional instructions   fluticasone 0.05 % cream Commonly known as: CUTIVATE Apply topically 2 (two) times daily. What changed: additional instructions   hydrochlorothiazide 12.5 MG capsule Commonly known as: MICROZIDE TAKE ONE CAPSULE (12.5 MG TOTAL) BY MOUTH EVERY OTHER DAY.   metFORMIN 500 MG tablet Commonly known as: GLUCOPHAGE Take 1 tablet (500 mg total) by mouth 2 (two) times daily with a meal.   omeprazole 20 MG capsule Commonly known as: PRILOSEC Take 1 capsule (20 mg total) by mouth 2 (two)  times daily before a meal. For heartburn.   SUMAtriptan 100 MG tablet Commonly known as: IMITREX TAKE 1 TABLET BY MOUTH AT MIGRAINE ONSET,MAY REPEAT IN 2 HOURS IF HEADACHE PERSISTS/RECURS *DO NOT EXCEED 100MG  IN 24 HOURS**   tadalafil 5 MG tablet Commonly known as: CIALIS Take 1 tablet (5 mg total) by mouth daily as needed for erectile dysfunction.   tamsulosin 0.4 MG Caps capsule Commonly known as: FLOMAX Take 1 capsule (0.4 mg total) by mouth daily.   triamcinolone 0.025 % cream Commonly known as: KENALOG Apply 1 application topically 2 (two) times daily.   vitamin C 250 MG tablet Commonly known as: ASCORBIC ACID Take 250 mg by mouth daily.   Vitamin D (Ergocalciferol) 1.25 MG (50000 UNIT) Caps capsule Commonly known as: DRISDOL TAKE ONE CAPSULE (50,000 UNITS TOTAL) BY MOUTH EVERY other week.   Zinc 100 MG Tabs Take by mouth.        Allergies:  Allergies  Allergen Reactions   Penicillins     Passed out Did it involve swelling of the face/tongue/throat, SOB, or low BP? Yes Did it involve sudden or severe rash/hives, skin peeling, or any reaction on the inside of your mouth or nose? No Did you need to seek medical attention at a hospital or doctor's office? No When did it last happen?      30 + years If all above answers are "NO", may proceed with cephalosporin use.     Family History: Family History  Problem Relation Age of Onset   Early death Father    Kidney disease Father    Heart failure Sister     Social History:  reports that he quit smoking about 38 years ago. His smoking use included cigarettes. He started smoking about 48 years ago. He has a 15 pack-year smoking history. He has never used smokeless tobacco. He reports that he does not currently use alcohol. He reports that he does not use drugs.   Physical Exam: BP 105/66 (BP Location: Left Arm, Patient Position: Sitting, Cuff Size: Normal)   Pulse 80   Ht 4\' 11"  (1.499 m)   Wt 200 lb (90.7 kg)    BMI 40.40 kg/m   Constitutional:  Alert, No acute distress. HEENT: [place] AT Respiratory: Normal respiratory effort, no increased work of breathing. Psychiatric: Normal mood and affect.   Assessment & Plan:    1. T1c high-risk prostate cancer PSA almost doubled between March-September 2024.  Will repeat PSA today to evaluate for transient elevation and if persistently elevated order PSMA/PET. Findings were discussed with Mr. Sopp and he's in agreement with this plan.   I have reviewed the above documentation for accuracy and completeness, and I agree with the above.   Pantelis Elgersma  Arnaldo Natal, MD  [facility] [address] [place], [place] [address] [phone]

## 2023-08-07 LAB — PSA: Prostate Specific Ag, Serum: 0.9 ng/mL (ref 0.0–4.0)

## 2023-08-08 ENCOUNTER — Encounter: Payer: Self-pay | Admitting: Urology

## 2023-08-12 DIAGNOSIS — H26492 Other secondary cataract, left eye: Secondary | ICD-10-CM | POA: Diagnosis not present

## 2023-08-12 DIAGNOSIS — H26491 Other secondary cataract, right eye: Secondary | ICD-10-CM | POA: Diagnosis not present

## 2023-08-19 ENCOUNTER — Encounter
Admission: RE | Admit: 2023-08-19 | Discharge: 2023-08-19 | Disposition: A | Discharge status: Home / Self Care | Payer: PPO | Source: Ambulatory Visit | Attending: Urology | Admitting: Urology

## 2023-08-19 DIAGNOSIS — C61 Malignant neoplasm of prostate: Secondary | ICD-10-CM | POA: Insufficient documentation

## 2023-08-19 MED ORDER — FLOTUFOLASTAT F 18 GALLIUM 296-5846 MBQ/ML IV SOLN
8.0000 | Freq: Once | INTRAVENOUS | Status: AC
  Administered 2023-08-19: 8.36 via INTRAVENOUS
  Filled 2023-08-19: qty 8

## 2023-08-31 NOTE — Progress Notes (Unsigned)
SUBJECTIVE:  Chief Complaint: Obesity  Interim History: He is up 4 lbs since the last visit.  Down 23 lbs overall since 06/10/2022. TBW loss of 10.04% Preston Thomas, a 72 year old gentleman with a history of prediabetes, vitamin D deficiency, and mild lower extremity edema, presents for a follow-up visit regarding his obesity treatment plan. He reports no significant changes in his health status, except for an increase in PSA level following monitoring for treatment of prostate cancer. The patient has already undergone a PET scan and is awaiting results to evaluate for return of the cancer. He expresses concern about the potential need for hormone replacement therapy, which previously led to a significant weight gain of 30 pounds.  Despite these health challenges, the patient has made significant strides in his weight loss journey, returning to his pre-cancer diagnosis weight. He has been actively walking, achieving approximately 11,000 steps daily. However, he expresses frustration about the potential impact of hormone therapy on his weight loss progress.  The patient also reports no changes in his medication regimen, which includes hydrochlorothiazide, metformin, and vitamin D, all filled at a [facility]. The patient is also considering the addition of a weighted vest to his exercise routine to increase strength training.  In terms of social history, the patient plans to visit his oldest son in [place] for the Christmas holidays. He expresses a commitment to maintaining his current health and weight loss regimen during this period.  Preston Thomas is here to discuss his progress with his obesity treatment plan. He is on the following a lower carbohydrate, vegetable and lean protein rich diet plan and states he is following his eating plan approximately 98 % of the time. He states he is exercising walking 45 minutes 3 times per week.  Fasting labs were obtained today.  He was informed we  would discuss his lab results at his next visit unless there is a critical issue that needs to be addressed sooner. He agreed to keep his next visit at the agreed upon time to discuss these results.    May want to repeat fasting IC over the next few months if continues with regain or stall of weight loss.    OBJECTIVE: Visit Diagnoses: Problem List Items Addressed This Visit     Hyperlipidemia   Prediabetes - Primary   Vitamin D deficiency   Low vitamin B12 level   Obesity (HCC)- start BMI 44.92   Lower leg edema  Obesity 72 year old with obesity, significant weight loss returning to pre-cancer weight. Concern about potential weight gain if hormone replacement therapy resumes for prostate cancer. Discussed stress and cortisol impact on weight. Engaging in significant physical activity (10,000-11,000 steps daily). Potential use of Zepbound or Wegovy to prevent weight regain or stall if needed.  - Continue current weight management plan - Consider Zepbound  or Wegovy for weight loss if Medicare approved - Encourage use of weighted vest during walks for strength training - Order fasting labs: insulin levels, A1c, thyroid function - Consider repeating metabolic tests if weight not maintained or reduced over next month  Prostate Cancer Slight PSA increase over past six months. Awaiting PET scan results. Possible resumption of hormone replacement therapy, previously led to significant weight gain. Need to adjust weight management plan if hormone therapy resumes. - Await PET scan results - Coordinate with oncology team regarding potential hormone replacement therapy - Monitor weight and adjust weight management plan if hormone therapy resumes  Prediabetes Lab Results  Component Value Date   HGBA1C  5.7 (H) 03/31/2023   HGBA1C 5.8 (H) 10/29/2022   HGBA1C 6.2 (H) 06/10/2022   Lab Results  Component Value Date   LDLCALC 67 03/31/2023   CREATININE 0.97 03/31/2023    Prediabetes managed  with lifestyle modifications and metformin. Compliant with treatment plan. Due for fasting labs to assess current status. No GI or other side effects with metformin.  - Order fasting labs: insulin levels, A1c, CMET - Continue/refill metformin Continue working on nutrition plan to decrease simple carbohydrates, increase lean proteins and exercise to promote weight loss, improve glycemic control and prevent progression to Type 2 diabetes.   Lower Extremity Edema Mild lower extremity edema managed with hydrochlorothiazide. No side effects with hydrochlorothiazide. - Continue/refill hydrochlorothiazide 12.5 mg every other day.    Hyperlipidemia LDL is at goal. HDL < 40- not at goal.  Medication(s): lipitor 20 mg daily. No side effects Cardiovascular risk factors: advanced age (older than 49 for men, 17 for women), dyslipidemia, male gender, obesity (BMI >= 30 kg/m2), and sedentary lifestyle  Lab Results  Component Value Date   CHOL 117 03/31/2023   HDL 34 (L) 03/31/2023   LDLCALC 67 03/31/2023   LDLDIRECT 167.0 05/15/2019   TRIG 81 03/31/2023   CHOLHDL 4 12/19/2021   CHOLHDL 4 09/02/2020   CHOLHDL 8 05/15/2019   Lab Results  Component Value Date   ALT 32 03/31/2023   AST 15 03/31/2023   ALKPHOS 67 03/31/2023   BILITOT 0.3 03/31/2023   The ASCVD Risk score (Arnett DK, et al., 2019) failed to calculate for the following reasons:   The valid total cholesterol range is 130 to 320 mg/dL  Plan: Continue statin. Continue to work on nutrition plan -decreasing simple carbohydrates, increasing lean proteins, decreasing saturated fats and cholesterol , avoiding trans fats and exercise as able to promote weight loss, improve lipids and decrease cardiovascular risks. Recheck fasting lipids today.  Consider Wegovy/Zepbound as additional medication to lower CV risks in addition to weight loss benefit.    Vitamin D Deficiency Vitamin D is at goal of 50.  Most recent vitamin D level was  69.6. He is on  prescription ergocalciferol 50,000 IU every 14 days. No N/V or muscle weakness with Ergocalciferol.  Lab Results  Component Value Date   VD25OH 69.6 03/31/2023   VD25OH 50.7 10/29/2022   VD25OH 35.8 06/10/2022    Plan: Continue and refill  prescription ergocalciferol 50,000 IU every 14 days Recheck vitamin D level today and adjust supplementation accordingly.  Low vitamin D levels can be associated with adiposity and may result in leptin resistance and weight gain. Also associated with fatigue. Currently on vitamin D supplementation without any adverse effects.    General Health Maintenance Maintaining active lifestyle with significant physical activity. Discussed importance of maintaining weight during holiday season and stress impact on health. - Encourage continued physical activity - Monitor weight and stress levels - Schedule follow-up appointment for January 7th at 9 AM.  Vitals Temp: 98.2 F (36.8 C) BP: 134/81 Pulse Rate: 78 SpO2: 97 %   Anthropometric Measurements Height: 4\' 11"  (1.499 m) Weight: 206 lb (93.4 kg) BMI (Calculated): 41.58 Weight at Last Visit: 202 lb Weight Lost Since Last Visit: 0 Weight Gained Since Last Visit: 4 lb Starting Weight: 229 lb Total Weight Loss (lbs): 23 lb (10.4 kg) Peak Weight: 238 lb   Body Composition  Body Fat %: 38.7 % Fat Mass (lbs): 79.8 lbs Muscle Mass (lbs): 120.2 lbs Total Body Water (lbs): 93.8 lbs Visceral  Fat Rating : 27   Other Clinical Data Fasting: yes Labs: yes Today's Visit #: 20 Starting Date: 06/10/22     ASSESSMENT AND PLAN:  Diet: Jahdai is currently in the action stage of change. As such, his goal is to continue with weight loss efforts. He has agreed to following a lower carbohydrate, vegetable and lean protein rich diet plan.  Exercise: Sanchez has been instructed to work up to a goal of 150 minutes of combined cardio and strengthening exercise per week for weight loss and  overall health benefits.   Behavior Modification:  We discussed the following Behavioral Modification Strategies today: increasing lean protein intake, decreasing simple carbohydrates, increasing vegetables, increase H2O intake, increase high fiber foods, holiday eating strategies, and planning for success. We discussed various medication options to help Tedric with his weight loss efforts and we both agreed to continue metformin for primary indication of prediabetes.  Return in about 4 weeks (around 09/29/2023).[name] He was informed of the importance of frequent follow up visits to maximize his success with intensive lifestyle modifications for his multiple health conditions.  Attestation Statements:   Reviewed by clinician on day of visit: allergies, medications, problem list, medical history, surgical history, family history, social history, and previous encounter notes.   Time spent on visit including pre-visit chart review and post-visit care and charting was 46 minutes.    Truth Barot, PA-C

## 2023-09-01 ENCOUNTER — Encounter (INDEPENDENT_AMBULATORY_CARE_PROVIDER_SITE_OTHER): Payer: Self-pay | Admitting: Physician Assistant

## 2023-09-01 ENCOUNTER — Ambulatory Visit (INDEPENDENT_AMBULATORY_CARE_PROVIDER_SITE_OTHER): Payer: PPO | Admitting: Physician Assistant

## 2023-09-01 VITALS — BP 134/81 | HR 78 | Temp 98.2°F | Ht 59.0 in | Wt 206.0 lb

## 2023-09-01 DIAGNOSIS — R7303 Prediabetes: Secondary | ICD-10-CM | POA: Diagnosis not present

## 2023-09-01 DIAGNOSIS — R7989 Other specified abnormal findings of blood chemistry: Secondary | ICD-10-CM

## 2023-09-01 DIAGNOSIS — E7849 Other hyperlipidemia: Secondary | ICD-10-CM | POA: Diagnosis not present

## 2023-09-01 DIAGNOSIS — R600 Localized edema: Secondary | ICD-10-CM

## 2023-09-01 DIAGNOSIS — Z6841 Body Mass Index (BMI) 40.0 and over, adult: Secondary | ICD-10-CM

## 2023-09-01 DIAGNOSIS — E785 Hyperlipidemia, unspecified: Secondary | ICD-10-CM | POA: Diagnosis not present

## 2023-09-01 DIAGNOSIS — E669 Obesity, unspecified: Secondary | ICD-10-CM

## 2023-09-01 DIAGNOSIS — E559 Vitamin D deficiency, unspecified: Secondary | ICD-10-CM

## 2023-09-01 MED ORDER — METFORMIN HCL 500 MG PO TABS: 500.0000 mg | ORAL_TABLET | Freq: Two times a day (BID) | ORAL | 0 refills | Status: DC

## 2023-09-01 MED ORDER — VITAMIN D (ERGOCALCIFEROL) 1.25 MG (50000 UNIT) PO CAPS: ORAL_CAPSULE | ORAL | 0 refills | Status: DC

## 2023-09-01 MED ORDER — HYDROCHLOROTHIAZIDE 12.5 MG PO CAPS: ORAL_CAPSULE | ORAL | 0 refills | Status: DC

## 2023-09-02 LAB — CMP14+EGFR
ALT: 49 [IU]/L — ABNORMAL HIGH (ref 0–44)
AST: 21 [IU]/L (ref 0–40)
Albumin: 4.3 g/dL (ref 3.9–4.9)
Alkaline Phosphatase: 65 [IU]/L (ref 44–121)
BUN/Creatinine Ratio: 18 (ref 10–24)
BUN: 17 mg/dL (ref 8–27)
Bilirubin Total: 0.3 mg/dL (ref 0.0–1.2)
CO2: 22 mmol/L (ref 20–29)
Calcium: 9.2 mg/dL (ref 8.6–10.2)
Chloride: 107 mmol/L — ABNORMAL HIGH (ref 96–106)
Creatinine, Ser: 0.97 mg/dL (ref 0.76–1.27)
Globulin, Total: 2.5 g/dL (ref 1.5–4.5)
Glucose: 101 mg/dL — ABNORMAL HIGH (ref 70–99)
Potassium: 4.3 mmol/L (ref 3.5–5.2)
Sodium: 145 mmol/L — ABNORMAL HIGH (ref 134–144)
Total Protein: 6.8 g/dL (ref 6.0–8.5)
eGFR: 84 mL/min/{1.73_m2} (ref >59)

## 2023-09-02 LAB — CBC WITH DIFFERENTIAL/PLATELET
Basophils Absolute: 0.1 10*3/uL (ref 0.0–0.2)
Basos: 1 %
EOS (ABSOLUTE): 0.3 10*3/uL (ref 0.0–0.4)
Eos: 3 %
Hematocrit: 47.1 % (ref 37.5–51.0)
Hemoglobin: 15.3 g/dL (ref 13.0–17.7)
Immature Grans (Abs): 0.2 10*3/uL — ABNORMAL HIGH (ref 0.0–0.1)
Immature Granulocytes: 2 %
Lymphocytes Absolute: 1.6 10*3/uL (ref 0.7–3.1)
Lymphs: 16 %
MCH: 29.8 pg (ref 26.6–33.0)
MCHC: 32.5 g/dL (ref 31.5–35.7)
MCV: 92 fL (ref 79–97)
Monocytes Absolute: 1 10*3/uL — ABNORMAL HIGH (ref 0.1–0.9)
Monocytes: 10 %
Neutrophils Absolute: 6.8 10*3/uL (ref 1.4–7.0)
Neutrophils: 68 %
Platelets: 273 10*3/uL (ref 150–450)
RBC: 5.14 x10E6/uL (ref 4.14–5.80)
RDW: 13.1 % (ref 11.6–15.4)
WBC: 9.9 10*3/uL (ref 3.4–10.8)

## 2023-09-02 LAB — HEMOGLOBIN A1C
Est. average glucose Bld gHb Est-mCnc: 117 mg/dL
Hgb A1c MFr Bld: 5.7 % — ABNORMAL HIGH (ref 4.8–5.6)

## 2023-09-02 LAB — LIPID PANEL WITH LDL/HDL RATIO
Cholesterol, Total: 132 mg/dL (ref 100–199)
HDL: 36 mg/dL — ABNORMAL LOW (ref >39)
LDL Chol Calc (NIH): 81 mg/dL (ref 0–99)
LDL/HDL Ratio: 2.3 {ratio} (ref 0.0–3.6)
Triglycerides: 75 mg/dL (ref 0–149)
VLDL Cholesterol Cal: 15 mg/dL (ref 5–40)

## 2023-09-02 LAB — INSULIN, RANDOM: INSULIN: 20.4 u[IU]/mL (ref 2.6–24.9)

## 2023-09-02 LAB — VITAMIN D 25 HYDROXY (VIT D DEFICIENCY, FRACTURES): Vit D, 25-Hydroxy: 69.1 ng/mL (ref 30.0–100.0)

## 2023-09-02 LAB — VITAMIN B12: Vitamin B-12: 463 pg/mL (ref 232–1245)

## 2023-09-02 LAB — TSH: TSH: 2.84 u[IU]/mL (ref 0.450–4.500)

## 2023-09-06 ENCOUNTER — Telehealth: Payer: Self-pay | Admitting: Urology

## 2023-09-06 DIAGNOSIS — C61 Malignant neoplasm of prostate: Secondary | ICD-10-CM

## 2023-09-06 NOTE — Telephone Encounter (Signed)
I contacted Preston Thomas to discuss his PSMA/PET scan.  Prior IMRT + brachytherapy + ADT for high risk prostate cancer.  Recent detectable/rising PSA.  PSMA/PET shows activity and 3 nodes at the level of the aortic bifurcation consistent with nodal metastasis.  There was additional node in the left perirectal fat that was indeterminant but suspicious.  No visceral or skeletal metastasis or recurrence in the prostate gland noted.  I discussed the findings and recommend medical oncology evaluation.  He was in agreement with this plan and referral was placed.

## 2023-09-07 DIAGNOSIS — C61 Malignant neoplasm of prostate: Secondary | ICD-10-CM | POA: Insufficient documentation

## 2023-09-07 NOTE — Progress Notes (Unsigned)
[facility] CONSULT NOTE  Patient Care Team: Doreene Nest, NP as PCP - General (Internal Medicine) Debbe Odea, MD as PCP - Cardiology (Cardiology)  ASSESSMENT & PLAN:  Preston Thomas is a 72 y.o.male with history of HTN, HLD, DM, OSA being seen at [facility] for prostate cancer.  New PSMA PET showed multiple avid lymph nodes.  We discussed these are small and the reason he has no symptoms. We discussed the natural history of prostate cancer and the standard treatment options that are available for prostate cancer. Discussed early signs of metastatic disease. Disease if not considered curable but treatable and manageable. Systemic treatment consists of ADT with next generation antiandrogen for palliative reason. We will need to balance AEs vs effectiveness.  He has experience from side effects of ADT.  He expressed that he would like to try systemic treatment knowing he is in a better place this time and also has other providers from his weight loss program to help him with weight loss management.  We discussed the importance of continued exercise, healthy diet. Side effect of ADT and abiraterone plus prednisone can be hypertension, hot flash, loss of libido and erectile dysfunction, shrinkage of penis and testicles, joint pain, loss of muscle mass and strength, decreased potassium, fatigue, increased liver enzymes, constipation, edema, hyperglycemia, changes in cholesterol, anemia, muscle pain or weakness, osteoporosis, infection. Severe liver toxicity and death has been reported. Rare arrhythmia and heart failure have been reported.  After discussion, he would like to proceed.   Current diagnosis: M1 HSPC Initial diagnosis: cT1cN0M0 PSA 12.7, GG5 (GRADES 4 + 5)  Germline testing: not yet  Somatic testing: not yet Treatment: initial treatment: 05/2019 IMRT + brachytherapy + ADT. Received final leuprolide injection 02/10/2021  Current PSA 0.9  At risk for side effect  of medication Supportive baseline bone mineral density study and then every 2 years calcium (1000-1200 mg daily from food and supplements) and vitamin D3 (1000 IU daily) Routine dental care Control and prevent diabetes Weight-bearing exercises (30 minutes per day) Limit alcohol consumption and avoid smoking  Hormone sensitive prostate cancer (HCC) Start AAP CMP every 2 weeks for first 3 months repeat LFT today Follow PSA about every 3 months Will start Lupron every 3 months next month  Follow up in about 4 weeks or 2 weeks after starting AAP.  CMP every 2 weeks for 3 months.  Standing CMP ordered.  Orders Placed This Encounter  Procedures   Hepatic function panel    Standing Status:   Future    Number of Occurrences:   1    Expiration Date:   09/08/2024   PSA, total and free    Standing Status:   Future    Number of Occurrences:   1    Expiration Date:   09/08/2024   Testosterone    Standing Status:   Future    Number of Occurrences:   1    Expiration Date:   09/08/2024   All questions were answered. The patient knows to call the clinic with any problems, questions or concerns. No barriers to learning was detected.  [name], MD 12/12/20244:09 PM  CHIEF COMPLAINTS/PURPOSE OF CONSULTATION:  Prostate cancer  HISTORY OF PRESENTING ILLNESS:  Preston Thomas 72 y.o. male is here because of prostate cancer.  He was initially diagnosed in February 2020 with Gleason 4+5 = 9 GG4 PSA 12.7 high risk disease.  He underwent EBRT with brachytherapy and ADT.  I  have reviewed his chart and materials related to his cancer extensively and collaborated history with the patient. Summary of oncologic history is as follows: Oncology History  Hormone sensitive prostate cancer (HCC)  11/25/2018 Pathology Results   Prostate biopsy diagnosis:  A. BENIGN PROSTATE TISSUE. NO EVIDENCE OF MALIGNANCY.   B.  PROSTATIC ADENOCARCINOMA. GLEASON'S SCORE 6 (GRADES 3 + 3) NOTED IN 1   OUT OF 1 SUBMITTED PROSTATE CORE SEGMENTS. APPROXIMATELY 60% OF SUBMITTED  TISSUE INVOLVED.. (GRADE GROUP 1) PERINEURAL INVASION IS PRESENT.   C.  PROSTATIC ADENOCARCINOMA. GLEASON'S SCORE 6 (GRADES 3 + 3) NOTED IN 1  OUT OF 1 SUBMITTED PROSTATE CORE SEGMENTS. APPROXIMATELY 45% OF SUBMITTED  TISSUE INVOLVED.. (GRADE GROUP 1)   D.  BENIGN CONNECTIVE TISSUE AND SMOOTH MUSCLE. NO PROSTATE TISSUE SEEN.   E.  PROSTATIC ADENOCARCINOMA. GLEASON'S SCORE 8 (GRADES 3 + 5) NOTED IN 1  OUT OF 1 SUBMITTED PROSTATE CORE SEGMENTS. APPROXIMATELY 91% OF SUBMITTED  TISSUE INVOLVED. (GRADE GROUP 4) PERINEURAL INVASION IS PRESENT.   F.  PROSTATIC ADENOCARCINOMA. GLEASON'S SCORE 7 (GRADES 3 + 4) NOTED IN 1  OUT OF 1 SUBMITTED PROSTATE CORE SEGMENTS. APPROXIMATELY 85% OF SUBMITTED  TISSUE INVOLVED.[name] GLEASON GRADE 4 COMPRISES 30% OF THE TUMOR. (GRADE GROUP  2) PERINEURAL INVASION IS PRESENT.   G.  PROSTATIC ADENOCARCINOMA. GLEASON'S SCORE 6 (GRADES 3 + 3) NOTED IN 1  OUT OF 2 SUBMITTED PROSTATE CORE SEGMENTS. APPROXIMATELY 9% OF SUBMITTED  TISSUE INVOLVED.. (GRADE GROUP 1)   H.  PROSTATIC ADENOCARCINOMA. GLEASON'S SCORE 6 (GRADES 3 + 3) NOTED IN 1  OUT OF 1 SUBMITTED PROSTATE CORE SEGMENTS. APPROXIMATELY 13% OF SUBMITTED  TISSUE INVOLVED.. (GRADE GROUP 1) PERINEURAL INVASION IS PRESENT.   I.  PROSTATIC ADENOCARCINOMA. GLEASON'S SCORE 7 (GRADES 4 + 3) NOTED IN 2  OUT OF 2 SUBMITTED PROSTATE CORE SEGMENTS. APPROXIMATELY 27% OF SUBMITTED  TISSUE INVOLVED.. (GRADE GROUP 3) GLEASON GRADE 4 COMPRISES 70% OF THE  TUMOR.   J. PROSTATIC ADENOCARCINOMA. GLEASON'S SCORE 9 (GRADES 4 + 5) NOTED IN 1  OUT OF 2 SUBMITTED PROSTATE CORE SEGMENTS. APPROXIMATELY 73% OF SUBMITTED  TISSUE INVOLVED.. (GRADE GROUP 5) PERINEURAL INVASION IS PRESENT.   K.  PROSTATIC ADENOCARCINOMA. GLEASON'S SCORE 8 (GRADES 3 + 5) NOTED IN 1  OUT OF 1 SUBMITTED PROSTATE CORE SEGMENTS. APPROXIMATELY 100% OF SUBMITTED  TISSUE INVOLVED.. (GRADE  GROUP 4) PERINEURAL INVASION IS PRESENT.   L.  PROSTATIC ADENOCARCINOMA. GLEASON'S SCORE 7 (GRADES 4 + 3) NOTED IN 1  OUT OF 1 SUBMITTED PROSTATE CORE SEGMENTS. APPROXIMATELY 66% OF SUBMITTED  TISSUE INVOLVED.. (GRADE GROUP 3) GLEASON GRADE 4 COMPRISES 60% OF THE  TUMOR.    08/19/2023 PET scan   PSMA PET IMPRESSION: 1. No evidence of recurrence in the prostate gland. 2. Three very small lymph nodes at the level of the aortic bifurcation with intense radiotracer activity most consistent with prostate cancer nodal metastasis. 3. One small node in the LEFT perirectal fat at 5 o'clock location in relation to teh rectum is indeterminate but suspicious. 4. No more distant metastatic nodal disease. No visceral metastasis or skeletal metastasis.   09/07/2023 Initial Diagnosis   Hormone sensitive prostate cancer (HCC)    Tumor Marker   PSA 08/18/18 11.54 11/11/18 12.7 10/23/19 0.29 02/19/20 0.3 04/22/20 0.15 09/02/20 0.11 04/23/21 0.04 11/28/21 <0.1 04/24/22 0.11 11/30/22 0.4 06/16/23 0.7 08/06/23 0.9     MEDICAL HISTORY:  Past Medical History:  Diagnosis Date   Acute conjunctivitis of both eyes 02/20/2022  Bilateral lower extremity edema    Cancer (HCC)    COLONIC POLYPS, HX OF 08/31/2007   Qualifier: Diagnosis of  By: Alphonsus Sias MD, Ronnette Hila    DIVERTICULITIS, HX OF 08/31/2007   Qualifier: Diagnosis of  By: Alice Reichert CMA (AAMA), Natasha     DIVERTICULOSIS, COLON 08/31/2007   Qualifier: Diagnosis of  By: Alphonsus Sias MD, Ronnette Hila    Dyspnea    Related to Covid   Gallbladder problem    GERD (gastroesophageal reflux disease)    History of kidney stones    Hx of radiation therapy 04/11/2019   Pneumonia due to COVID-19 virus 04/19/2019   Prostate cancer (HCC)    RENAL CALCULUS, HX OF 08/31/2007   Qualifier: Diagnosis of  By: Alice Reichert CMA (AAMA), Natasha     Shortness of breath 02/21/2020   Sleep apnea    Swallowing difficulty     SURGICAL HISTORY: Past Surgical History:   Procedure Laterality Date   APPENDECTOMY  1987   CHOLECYSTECTOMY  1987   RADIOACTIVE SEED IMPLANT N/A 06/27/2019   Procedure: RADIOACTIVE SEED IMPLANT/BRACHYTHERAPY IMPLANT;  Surgeon: Riki Altes, MD;  Location: [facility] ORS;  Service: Urology;  Laterality: N/A;    SOCIAL HISTORY: Social History   Socioeconomic History   Marital status: Married    Spouse name: Not on file   Number of children: Not on file   Years of education: Not on file   Highest education level: Not on file  Occupational History   Not on file  Tobacco Use   Smoking status: Former    Current packs/day: 0.00    Average packs/day: 1.5 packs/day for 10.0 years (15.0 ttl pk-yrs)    Types: Cigarettes    Start date: 09/28/1974    Quit date: 09/28/1984    Years since quitting: 38.9   Smokeless tobacco: Never  Vaping Use   Vaping status: Never Used  Substance and Sexual Activity   Alcohol use: Not Currently   Drug use: Never   Sexual activity: Yes  Other Topics Concern   Not on file  Social History Narrative   Not on file   Social Drivers of Health   Financial Resource Strain: Low Risk  (09/14/2022)   Overall Financial Resource Strain (CARDIA)    Difficulty of Paying Living Expenses: Not hard at all  Food Insecurity: No Food Insecurity (09/14/2022)   Hunger Vital Sign    Worried About Running Out of Food in the Last Year: Never true    Ran Out of Food in the Last Year: Never true  Transportation Needs: No Transportation Needs (09/14/2022)   PRAPARE - Administrator, Civil Service (Medical): No    Lack of Transportation (Non-Medical): No  Physical Activity: Insufficiently Active (09/14/2022)   Exercise Vital Sign    Days of Exercise per Week: 2 days    Minutes of Exercise per Session: 60 min  Stress: No Stress Concern Present (09/14/2022)   [employer] of Occupational Health - Occupational Stress Questionnaire    Feeling of Stress : Not at all  Social Connections: Socially Integrated  (09/14/2022)   Social Connection and Isolation Panel [NHANES]    Frequency of Communication with Friends and Family: More than three times a week    Frequency of Social Gatherings with Friends and Family: More than three times a week    Attends Religious Services: More than 4 times per year    Active Member of [employer] or Organizations: Yes    Attends Club or  Organization Meetings: More than 4 times per year    Marital Status: Married  Catering manager Violence: Not At Risk (09/14/2022)   Humiliation, Afraid, Rape, and Kick questionnaire    Fear of Current or Ex-Partner: No    Emotionally Abused: No    Physically Abused: No    Sexually Abused: No    FAMILY HISTORY: Family History  Problem Relation Age of Onset   Early death Father    Kidney disease Father    Heart failure Sister     ALLERGIES:  is allergic to penicillins.  MEDICATIONS:  Current Outpatient Medications  Medication Sig Dispense Refill   abiraterone acetate (ZYTIGA) 250 MG tablet Take 4 tablets (1,000 mg total) by mouth daily. Take on an empty stomach 1 hour before or 2 hours after a meal 120 tablet 11   predniSONE (DELTASONE) 5 MG tablet Take 1 tablet (5 mg total) by mouth daily with breakfast. 30 tablet 11   atorvastatin (LIPITOR) 20 MG tablet Take 1 tablet (20 mg total) by mouth daily. for cholesterol 90 tablet 3   clobetasol ointment (TEMOVATE) 0.05 % Apply 1 application topically 2 (two) times daily. (Patient taking differently: Apply 1 application  topically 2 (two) times daily. As needed) 30 g 0   fluocinonide (LIDEX) 0.05 % external solution Apply 1 application topically 2 (two) times daily. (Patient taking differently: Apply 1 application  topically 2 (two) times daily. As needed) 60 mL 0   fluticasone (CUTIVATE) 0.05 % cream Apply topically 2 (two) times daily. (Patient taking differently: Apply topically 2 (two) times daily. As needed) 30 g 0   hydrochlorothiazide (MICROZIDE) 12.5 MG capsule TAKE ONE CAPSULE  (12.5 MG TOTAL) BY MOUTH EVERY OTHER DAY. 15 capsule 0   metFORMIN (GLUCOPHAGE) 500 MG tablet Take 1 tablet (500 mg total) by mouth 2 (two) times daily with a meal. 180 tablet 0   omeprazole (PRILOSEC) 20 MG capsule Take 1 capsule (20 mg total) by mouth 2 (two) times daily before a meal. For heartburn. 180 capsule 3   SUMAtriptan (IMITREX) 100 MG tablet TAKE 1 TABLET BY MOUTH AT MIGRAINE ONSET,MAY REPEAT IN 2 HOURS IF HEADACHE PERSISTS/RECURS *DO NOT EXCEED 100MG  IN 24 HOURS** 10 tablet 0   tadalafil (CIALIS) 5 MG tablet Take 1 tablet (5 mg total) by mouth daily as needed for erectile dysfunction. 30 tablet 3   tamsulosin (FLOMAX) 0.4 MG CAPS capsule Take 1 capsule (0.4 mg total) by mouth daily. 90 capsule 2   triamcinolone (KENALOG) 0.025 % cream Apply 1 application topically 2 (two) times daily.     vitamin B-12 (CYANOCOBALAMIN) 500 MCG tablet Take 500 mcg by mouth daily.     vitamin C (ASCORBIC ACID) 250 MG tablet Take 250 mg by mouth daily.     Vitamin D, Ergocalciferol, (DRISDOL) 1.25 MG (50000 UNIT) CAPS capsule TAKE ONE CAPSULE (50,000 UNITS TOTAL) BY MOUTH EVERY other week. 5 capsule 0   Zinc 100 MG TABS Take by mouth.     No current facility-administered medications for this visit.    REVIEW OF SYSTEMS:   All relevant systems were reviewed with the patient and are negative.  PHYSICAL EXAMINATION: ECOG PERFORMANCE STATUS: 1 - Symptomatic but completely ambulatory  Vitals:   09/09/23 1434  BP: 137/66  Pulse: 77  Resp: 20  Temp: 97.7 F (36.5 C)  SpO2: 95%   Filed Weights   09/09/23 1434  Weight: 211 lb (95.7 kg)    GENERAL: alert, no distress and comfortable  SKIN: skin color is normal, no jaundice, rashes EYES: sclera clear OROPHARYNX: no exudate, no erythema NECK: supple.  No palpable cervical lymph nodes. LUNGS: Effort normal, no respiratory distress.  Clear to auscultation bilaterally HEART: regular rate & rhythm and no lower extremity edema ABDOMEN: soft,  non-tender and nondistended NEURO: no focal motor/sensory deficits  LABORATORY DATA:  I have reviewed the data as listed Lab Results  Component Value Date   WBC 9.9 09/01/2023   HGB 15.3 09/01/2023   HCT 47.1 09/01/2023   MCV 92 09/01/2023   PLT 273 09/01/2023   Recent Labs    10/29/22 1506 03/31/23 0918 09/01/23 0853  NA 142 144 145*  K 4.8 4.3 4.3  CL 106 107* 107*  CO2 21 22 22   GLUCOSE 100* 98 101*  BUN 18 19 17   CREATININE 1.05 0.97 0.97  CALCIUM 9.5 9.0 9.2  PROT 6.9 6.5 6.8  ALBUMIN 4.4 4.3 4.3  AST 24 15 21   ALT 53* 32 49*  ALKPHOS 77 67 65  BILITOT 0.2 0.3 0.3    RADIOGRAPHIC STUDIES: I have personally reviewed the radiological images as listed and agreed with the findings in the report. NM PET (PSMA) SKULL TO MID THIGH Result Date: 09/05/2023 CLINICAL DATA:  Prostate carcinoma with biochemical recurrence. PSA equal 0.9 EXAM: NUCLEAR MEDICINE PET SKULL BASE TO THIGH TECHNIQUE: 8.4 mCi Flotufolastat (Posluma) was injected intravenously. Full-ring PET imaging was performed from the skull base to thigh after the radiotracer. CT data was obtained and used for attenuation correction and anatomic localization. COMPARISON:  None Available. FINDINGS: NECK No radiotracer activity in neck lymph nodes. Incidental CT finding: None. CHEST No radiotracer accumulation within mediastinal or hilar lymph nodes. No suspicious pulmonary nodules on the CT scan. Incidental CT finding: None. ABDOMEN/PELVIS Prostate: Mild relatively low level nonspecific activity throughout the prostate gland. Lymph nodes: Three intensely radiotracer avid pelvic lymph nodes. Two small nodes position between the common iliac arteries. Node on the LEFT on image 115 measures approximately 2 mm with SUV max equal 6.5. Node on the RIGHT is difficult to define by CT but with intense with activity SUV max equal 5.4 on image 116. Slightly more superior just ventral to the aortic bifurcation is a 3 mm node on image 105  with SUV max equal 7.9. Posterior LEFT of the rectum in the perirectal fat (approximately 5 o'clock in relation to the rectum) is a focus of radiotracer activity SUV max equal 3.8. Very small 1 mm node at this level. Node nodes more superiorly in the periaortic retroperitoneum. Liver: No evidence of liver metastasis. Incidental CT finding: None. SKELETON No focal activity to suggest skeletal metastasis. IMPRESSION: 1. No evidence of recurrence in the prostate gland. 2. Three very small lymph nodes at the level of the aortic bifurcation with intense radiotracer activity most consistent with prostate cancer nodal metastasis. 3. One small node in the LEFT perirectal fat at 5 o'clock location in relation to teh rectum is indeterminate but suspicious. 4. No more distant metastatic nodal disease. No visceral metastasis or skeletal metastasis. Electronically Signed   By: Genevive Bi M.D.   On: 09/05/2023 20:00

## 2023-09-09 ENCOUNTER — Telehealth: Payer: Self-pay | Admitting: Pharmacy Technician

## 2023-09-09 ENCOUNTER — Inpatient Hospital Stay: Payer: PPO

## 2023-09-09 ENCOUNTER — Other Ambulatory Visit (HOSPITAL_COMMUNITY): Payer: Self-pay

## 2023-09-09 VITALS — BP 137/66 | HR 77 | Temp 97.7°F | Resp 20 | Wt 211.0 lb

## 2023-09-09 DIAGNOSIS — E1159 Type 2 diabetes mellitus with other circulatory complications: Secondary | ICD-10-CM

## 2023-09-09 DIAGNOSIS — Z9189 Other specified personal risk factors, not elsewhere classified: Secondary | ICD-10-CM | POA: Diagnosis not present

## 2023-09-09 DIAGNOSIS — C61 Malignant neoplasm of prostate: Secondary | ICD-10-CM | POA: Diagnosis not present

## 2023-09-09 DIAGNOSIS — Z79899 Other long term (current) drug therapy: Secondary | ICD-10-CM | POA: Insufficient documentation

## 2023-09-09 DIAGNOSIS — I10 Essential (primary) hypertension: Secondary | ICD-10-CM | POA: Diagnosis not present

## 2023-09-09 DIAGNOSIS — Z87891 Personal history of nicotine dependence: Secondary | ICD-10-CM | POA: Diagnosis not present

## 2023-09-09 LAB — HEPATIC FUNCTION PANEL
ALT: 38 U/L (ref 0–44)
AST: 23 U/L (ref 15–41)
Albumin: 4.1 g/dL (ref 3.5–5.0)
Alkaline Phosphatase: 55 U/L (ref 38–126)
Bilirubin, Direct: <0.1 mg/dL (ref 0.0–0.2)
Total Bilirubin: 0.4 mg/dL (ref <1.2)
Total Protein: 7 g/dL (ref 6.5–8.1)

## 2023-09-09 MED ORDER — PREDNISONE 5 MG PO TABS
5.0000 mg | ORAL_TABLET | Freq: Every day | ORAL | 11 refills | Status: DC
  Filled 2023-09-09 – 2023-09-10 (×4): qty 30, 30d supply, fill #0
  Filled 2023-09-30: qty 30, 30d supply, fill #1
  Filled 2023-10-22: qty 30, 30d supply, fill #2
  Filled 2023-11-25: qty 30, 30d supply, fill #3
  Filled 2023-12-21: qty 30, 30d supply, fill #4
  Filled 2024-01-27: qty 30, 30d supply, fill #5
  Filled 2024-03-05: qty 30, 30d supply, fill #6
  Filled 2024-03-23 (×2): qty 30, 30d supply, fill #7
  Filled 2024-05-05: qty 30, 30d supply, fill #8
  Filled 2024-06-09: qty 30, 30d supply, fill #9

## 2023-09-09 MED ORDER — ABIRATERONE ACETATE 250 MG PO TABS
1000.0000 mg | ORAL_TABLET | Freq: Every day | ORAL | 11 refills | Status: DC
  Filled 2023-09-10 (×3): qty 120, 30d supply, fill #0
  Filled 2023-09-30: qty 120, 30d supply, fill #1
  Filled 2023-10-22: qty 120, 30d supply, fill #2
  Filled 2023-11-25: qty 120, 30d supply, fill #3
  Filled 2023-12-21: qty 120, 30d supply, fill #4
  Filled 2024-01-27: qty 120, 30d supply, fill #5
  Filled 2024-02-23: qty 120, 30d supply, fill #6
  Filled 2024-03-23: qty 120, 30d supply, fill #7
  Filled 2024-04-18: qty 120, 30d supply, fill #8
  Filled 2024-05-10 (×4): qty 120, 30d supply, fill #9
  Filled 2024-06-09: qty 120, 30d supply, fill #10
  Filled 2024-07-07: qty 120, 30d supply, fill #11

## 2023-09-09 NOTE — Assessment & Plan Note (Addendum)
Start AAP CMP every 2 weeks for first 3 months repeat LFT today Follow PSA about every 3 months Will start Lupron every 3 months any day next month

## 2023-09-09 NOTE — Telephone Encounter (Signed)
Oral Oncology Patient Advocate Encounter  Prior Authorization for abiraterone has been approved.    PA# [id number] Effective dates: 09/09/23 through 09/08/24  Patients co-pay is $100.    Jinger Neighbors, CPhT-Adv Oncology Pharmacy Patient Advocate [facility] Direct Number: [phone]  Fax: [phone]

## 2023-09-09 NOTE — Assessment & Plan Note (Signed)
Supportive baseline bone mineral density study and then every 2 years calcium (1000-1200 mg daily from food and supplements) and vitamin D3 (1000 IU daily) Routine dental care Control and prevent diabetes Weight-bearing exercises (30 minutes per day) Limit alcohol consumption and avoid smoking

## 2023-09-09 NOTE — Telephone Encounter (Signed)
Oral Oncology Patient Advocate Encounter   Received notification that prior authorization for abiraterone is required.   PA submitted on 09/09/23 Key [id number] Status is pending     Jinger Neighbors, CPhT-Adv Oncology Pharmacy Patient Advocate [facility] Direct Number: [phone]  Fax: [phone]

## 2023-09-10 ENCOUNTER — Other Ambulatory Visit (HOSPITAL_COMMUNITY): Payer: Self-pay

## 2023-09-10 ENCOUNTER — Telehealth: Payer: Self-pay

## 2023-09-10 ENCOUNTER — Other Ambulatory Visit: Payer: Self-pay | Admitting: Pharmacy Technician

## 2023-09-10 ENCOUNTER — Other Ambulatory Visit: Payer: Self-pay

## 2023-09-10 LAB — PSA, TOTAL AND FREE
PSA, Free Pct: 38.9 %
PSA, Free: 0.35 ng/mL
Prostate Specific Ag, Serum: 0.9 ng/mL (ref 0.0–4.0)

## 2023-09-10 LAB — TESTOSTERONE: Testosterone: 144 ng/dL — ABNORMAL LOW (ref 264–916)

## 2023-09-10 NOTE — Progress Notes (Signed)
Specialty Pharmacy Initial Fill Coordination Note  Preston Thomas is a 72 y.o. male contacted today regarding refills of specialty medication(s) Abiraterone Acetate (ZYTIGA) .  Patient requested Delivery  on 09/14/23  to verified address [address]   [place] [address]   Medication will be filled on 09/10/23.   Patient is aware of $100 copayment.

## 2023-09-10 NOTE — Telephone Encounter (Signed)
Oral Oncology Pharmacist Encounter  Received and spoke to patient about the new prescription for Zytiga (abiraterone) for the treatment of metastatic hormone sensitive prostate cancer in conjunction with prednisone, planned duration until disease progression or unacceptable toxicity .  Labs from 09/01/23 (CBC and CMP) and 09/09/23 (hepatic panel) assessed, no interventions needed. Prescription dose and frequency assessed for appropriateness.  Current medication list in Epic reviewed, DDIs with Zytiga identified: - atorvastatin (Cat C): zytiga may increase adverse effects of statins. Will have patient monitor for increased effects including rhabdomyolysis. - tamsulosin (Cat C): zytiga may increase the concentration of tamsulosin. Will have patient monitor for hypotension, orthostasis.   Evaluated chart and no patient barriers to medication adherence noted.  Patient agreement for treatment documented in MD note on 09/09/2023.  Counseled patient on administration, dosing, side effects, monitoring, drug-food interactions, safe handling, storage, and disposal.  Patient will take Zytiga 250mg  tablets, 4 tablets (1000mg ) by mouth once daily on an empty stomach, 1 hour before or 2 hours after a meal. Patient states he will take his Zytiga when he first wakes up and will wait at least 1 hour before eating. Patient will take prednisone 5mg  tablet, 1 tablet by mouth one daily with breakfast.  Zytiga start date: 09/15/2023  Adverse effects include but are not limited to: peripheral edema, GI upset, hypertension, hot flashes, fatigue, and arthralgias.    Prednisone prescription has been sent to [facility] outpatient [facility] on 09/09/23 and patient will receive prednisone with the zytiga through shipment.  Reviewed with patient importance of keeping a medication schedule and plan for any missed doses. No barriers to medication adherence identified.  Medication reconciliation performed and  medication/allergy list updated.  Patient informed the pharmacy will reach out 5-7 days prior to needing next fill of Zytiga to coordinate continued medication acquisition to prevent break in therapy.  All questions answered. Patient voiced understanding and appreciation. Medication education handout placed in mail for patient. Patient knows to call the office with questions or concerns. Oral Chemotherapy Clinic phone number provided to patient.   Bethel Born, PharmD Hematology/Oncology Clinical Pharmacist [facility] Oral Chemotherapy Navigation Clinic [phone] 09/10/2023 9:56 AM

## 2023-09-10 NOTE — Progress Notes (Signed)
Patient counseled in telephone encounter on 09/10/2023.   Bethel Born, PharmD Hematology/Oncology Clinical Pharmacist [facility] Oral Chemotherapy Navigation Clinic [phone]

## 2023-09-13 ENCOUNTER — Other Ambulatory Visit (HOSPITAL_COMMUNITY): Payer: Self-pay

## 2023-09-13 NOTE — Progress Notes (Signed)
Introduced myself to the patient as the prostate nurse navigator.  No barriers to care identified at this time.  He is here to discuss his treatment options and will start AAP along with Lupron.  I gave him my business card and asked him to call me with questions or concerns.  Verbalized understanding.

## 2023-09-15 ENCOUNTER — Inpatient Hospital Stay: Payer: PPO

## 2023-09-15 NOTE — Progress Notes (Signed)
[facility] Clinical Social Work  Initial Assessment   Preston Thomas is a 72 y.o. year old male contacted by phone. Clinical Social Work was referred by new patient protocol for assessment of psychosocial needs.   SDOH (Social Determinants of Health) assessments performed: Yes SDOH Interventions    Flowsheet Row Clinical Support from 09/14/2022 in [facility] Visit from 09/12/2020 in [facility] Clinical Support from 09/10/2020 in [facility]  SDOH Interventions     Food Insecurity Interventions Intervention Not Indicated -- --  Housing Interventions Intervention Not Indicated -- --  Transportation Interventions Intervention Not Indicated -- --  Utilities Interventions Intervention Not Indicated -- --  Alcohol Usage Interventions Intervention Not Indicated (Score <7) -- --  Depression Interventions/Treatment  -- Counseling PHQ2-9 Score <4 Follow-up Not Indicated  Financial Strain Interventions Intervention Not Indicated -- --  Physical Activity Interventions Intervention Not Indicated -- --  Stress Interventions Intervention Not Indicated -- --  Social Connections Interventions Intervention Not Indicated -- --       SDOH Screenings   Food Insecurity: No Food Insecurity (09/15/2023)  Housing: Low Risk  (09/15/2023)  Transportation Needs: No Transportation Needs (09/15/2023)  Utilities: Not At Risk (09/15/2023)  Alcohol Screen: Low Risk  (09/14/2022)  Depression (PHQ2-9): Low Risk  (01/06/2023)  Financial Resource Strain: Low Risk  (09/14/2022)  Physical Activity: Insufficiently Active (09/14/2022)  Social Connections: Socially Integrated (09/14/2022)  Stress: No Stress Concern Present (09/14/2022)  Tobacco Use: Medium Risk (09/01/2023)     Distress Screen completed: No    09/09/2023    2:49 PM  ONCBCN DISTRESS SCREENING  Screening Type Initial Screening   Distress experienced in past week (1-10) 0  Information Concerns Type Lack of info about diagnosis      Family/Social Information:  Housing Arrangement: patient lives with spouse. Family members/support persons in your life? 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  Income source: Employment Financial concerns: No Type of concern: None Food access concerns: no Religious or spiritual practice: Yes-Christian. Services Currently in place:  Spiritual Community, Community education officer, Employment, Family    Coping/ Adjustment to diagnosis: Patient understands treatment plan and what happens next? yes Concerns about diagnosis and/or treatment:  No specific worries related to treatment. Patient reported stressors:  No Stressors Reported Patient enjoys time with family/ friends Current coping skills/ strengths: Ability for insight , Active sense of humor , Average or above average intelligence , Capable of independent living , Communication skills , Contractor , General fund of knowledge , Motivation for treatment/growth , Religious Affiliation , and Supportive family/friends     SUMMARY: Current SDOH Barriers:  No SDOH Barriers identified at time of assessment.  Clinical Social Work Clinical Goal(s):  No clinical social work goals at this time  Interventions: Discussed common feeling and emotions when being diagnosed with cancer, and the importance of support during treatment Informed patient of the support team roles and support services at [facility] Provided CSW contact information and encouraged patient to call with any questions or concerns  Follow Up Plan: Patient will contact CSW with any support or resource needs Patient verbalizes understanding of plan: Yes  Marguerita Merles, LCSW Clinical Social Worker [facility] [phone]

## 2023-09-16 ENCOUNTER — Ambulatory Visit: Payer: PPO

## 2023-09-16 VITALS — Ht 59.0 in | Wt 211.0 lb

## 2023-09-16 DIAGNOSIS — Z0000 Encounter for general adult medical examination without abnormal findings: Secondary | ICD-10-CM | POA: Diagnosis not present

## 2023-09-16 NOTE — Patient Instructions (Signed)
Preston Thomas , Thank you for taking time to come for your Medicare Wellness Visit. I appreciate your ongoing commitment to your health goals. Please review the following plan we discussed and let me know if I can assist you in the future.   Referrals/Orders/Follow-Ups/Clinician Recommendations: none  This is a list of the screening recommended for you and due dates:  Health Maintenance  Topic Date Due   Zoster (Shingles) Vaccine (1 of 2) Never done   DTaP/Tdap/Td vaccine (2 - Tdap) 04/25/2013   Flu Shot  04/29/2023   Medicare Annual Wellness Visit  09/15/2024   Colon Cancer Screening  09/28/2026   Pneumonia Vaccine  Completed   Hepatitis C Screening  Completed   HPV Vaccine  Aged Out   COVID-19 Vaccine  Discontinued    Advanced directives: (Copy Requested) Please bring a copy of your health care power of attorney and living will to the office to be added to your chart at your convenience.  Next Medicare Annual Wellness Visit scheduled for next year: Yes 09/18/24@ 8:10am televisit

## 2023-09-16 NOTE — Progress Notes (Signed)
Subjective:   Preston Thomas is a 72 y.o. male who presents for Medicare Annual/Subsequent preventive examination.  Visit Complete: Virtual I connected with  Preston Thomas on 09/16/23 by a audio enabled telemedicine application and verified that I am speaking with the correct person using two identifiers.  Patient Location: Home  Provider Location: Home Office  I discussed the limitations of evaluation and management by telemedicine. The patient expressed understanding and agreed to proceed.  Vital Signs: Because this visit was a virtual/telehealth visit, some criteria may be missing or patient reported. Any vitals not documented were not able to be obtained and vitals that have been documented are patient reported.  Patient Medicare AWV questionnaire was completed by the patient on (not done); I have confirmed that all information answered by patient is correct and no changes since this date.  Cardiac Risk Factors include: advanced age (>66men, >74 women);dyslipidemia;male gender;obesity (BMI >30kg/m2)     Objective:    Today's Vitals   09/16/23 0819  Weight: 211 lb (95.7 kg)  Height: 4\' 11"  (1.499 m)  PainSc: 0-No pain   Body mass index is 42.62 kg/m.     09/16/2023    8:28 AM 09/14/2022   10:27 AM 07/07/2022    2:02 PM 09/12/2021    8:20 AM 09/10/2020    8:14 AM 04/29/2020    9:04 AM 10/30/2019    9:36 AM  Advanced Directives  Does Patient Have a Medical Advance Directive? Yes Yes Yes Yes Yes No Yes  Type of Advance Directive Living will;Healthcare Power of [employer] Power of [place];Living will Healthcare Power of [employer] of [place];Living will Healthcare Power of [place];Living will  Healthcare Power of [place];Living will  Does patient want to make changes to medical advance directive?   No - Patient declined Yes (MAU/Ambulatory/Procedural Areas - Information given)   No - Patient declined  Copy of Healthcare Power  of Attorney in Chart? No - copy requested No - copy requested   No - copy requested  No - copy requested  Would patient like information on creating a medical advance directive?      No - Patient declined     Current Medications (verified) Outpatient Encounter Medications as of 09/16/2023  Medication Sig   abiraterone acetate (ZYTIGA) 250 MG tablet Take 4 tablets (1,000 mg total) by mouth daily. Take on an empty stomach 1 hour before or 2 hours after a meal   atorvastatin (LIPITOR) 20 MG tablet Take 1 tablet (20 mg total) by mouth daily. for cholesterol   clobetasol ointment (TEMOVATE) 0.05 % Apply 1 application topically 2 (two) times daily. (Patient taking differently: Apply 1 application  topically 2 (two) times daily. As needed)   fluocinonide (LIDEX) 0.05 % external solution Apply 1 application topically 2 (two) times daily. (Patient taking differently: Apply 1 application  topically 2 (two) times daily. As needed)   fluticasone (CUTIVATE) 0.05 % cream Apply topically 2 (two) times daily. (Patient taking differently: Apply topically 2 (two) times daily. As needed)   hydrochlorothiazide (MICROZIDE) 12.5 MG capsule TAKE ONE CAPSULE (12.5 MG TOTAL) BY MOUTH EVERY OTHER DAY.   metFORMIN (GLUCOPHAGE) 500 MG tablet Take 1 tablet (500 mg total) by mouth 2 (two) times daily with a meal.   omeprazole (PRILOSEC) 20 MG capsule Take 1 capsule (20 mg total) by mouth 2 (two) times daily before a meal. For heartburn.   predniSONE (DELTASONE) 5 MG tablet Take 1 tablet (5 mg total)  by mouth daily with breakfast.   SUMAtriptan (IMITREX) 100 MG tablet TAKE 1 TABLET BY MOUTH AT MIGRAINE ONSET,MAY REPEAT IN 2 HOURS IF HEADACHE PERSISTS/RECURS *DO NOT EXCEED 100MG  IN 24 HOURS**   tadalafil (CIALIS) 5 MG tablet Take 1 tablet (5 mg total) by mouth daily as needed for erectile dysfunction.   tamsulosin (FLOMAX) 0.4 MG CAPS capsule Take 1 capsule (0.4 mg total) by mouth daily.   triamcinolone (KENALOG) 0.025 % cream  Apply 1 application topically 2 (two) times daily.   vitamin B-12 (CYANOCOBALAMIN) 500 MCG tablet Take 500 mcg by mouth daily.   vitamin C (ASCORBIC ACID) 250 MG tablet Take 250 mg by mouth daily.   Vitamin D, Ergocalciferol, (DRISDOL) 1.25 MG (50000 UNIT) CAPS capsule TAKE ONE CAPSULE (50,000 UNITS TOTAL) BY MOUTH EVERY other week.   Zinc 100 MG TABS Take by mouth.   No facility-administered encounter medications on file as of 09/16/2023.    Allergies (verified) Penicillins   History: Past Medical History:  Diagnosis Date   Acute conjunctivitis of both eyes 02/20/2022   Bilateral lower extremity edema    Cancer (HCC)    COLONIC POLYPS, HX OF 08/31/2007   Qualifier: Diagnosis of  By: Alphonsus Sias MD, Ronnette Hila    DIVERTICULITIS, HX OF 08/31/2007   Qualifier: Diagnosis of  By: Alice Reichert CMA (AAMA), Natasha     DIVERTICULOSIS, COLON 08/31/2007   Qualifier: Diagnosis of  By: Alphonsus Sias MD, Ronnette Hila    Dyspnea    Related to Covid   Gallbladder problem    GERD (gastroesophageal reflux disease)    History of kidney stones    Hx of radiation therapy 04/11/2019   Pneumonia due to COVID-19 virus 04/19/2019   Prostate cancer (HCC)    RENAL CALCULUS, HX OF 08/31/2007   Qualifier: Diagnosis of  By: Alice Reichert CMA (AAMA), Natasha     Shortness of breath 02/21/2020   Sleep apnea    Swallowing difficulty    Past Surgical History:  Procedure Laterality Date   APPENDECTOMY  1987   CHOLECYSTECTOMY  1987   RADIOACTIVE SEED IMPLANT N/A 06/27/2019   Procedure: RADIOACTIVE SEED IMPLANT/BRACHYTHERAPY IMPLANT;  Surgeon: Riki Altes, MD;  Location: [facility] ORS;  Service: Urology;  Laterality: N/A;   Family History  Problem Relation Age of Onset   Early death Father    Kidney disease Father    Heart failure Sister    Social History   Socioeconomic History   Marital status: Married    Spouse name: Not on file   Number of children: Not on file   Years of education: Not on file   Highest  education level: Not on file  Occupational History   Not on file  Tobacco Use   Smoking status: Former    Current packs/day: 0.00    Average packs/day: 1.5 packs/day for 10.0 years (15.0 ttl pk-yrs)    Types: Cigarettes    Start date: 09/28/1974    Quit date: 09/28/1984    Years since quitting: 38.9   Smokeless tobacco: Never  Vaping Use   Vaping status: Never Used  Substance and Sexual Activity   Alcohol use: Not Currently   Drug use: Never   Sexual activity: Yes  Other Topics Concern   Not on file  Social History Narrative   Not on file   Social Drivers of Health   Financial Resource Strain: Low Risk  (09/16/2023)   Overall Financial Resource Strain (CARDIA)    Difficulty of Paying Living Expenses:  Not very hard  Food Insecurity: No Food Insecurity (09/16/2023)   Hunger Vital Sign    Worried About Running Out of Food in the Last Year: Never true    Ran Out of Food in the Last Year: Never true  Transportation Needs: No Transportation Needs (09/16/2023)   PRAPARE - Administrator, Civil Service (Medical): No    Lack of Transportation (Non-Medical): No  Physical Activity: Sufficiently Active (09/16/2023)   Exercise Vital Sign    Days of Exercise per Week: 5 days    Minutes of Exercise per Session: 50 min  Stress: No Stress Concern Present (09/16/2023)   [employer] of Occupational Health - Occupational Stress Questionnaire    Feeling of Stress : Not at all  Social Connections: Socially Integrated (09/16/2023)   Social Connection and Isolation Panel [NHANES]    Frequency of Communication with Friends and Family: More than three times a week    Frequency of Social Gatherings with Friends and Family: More than three times a week    Attends Religious Services: More than 4 times per year    Active Member of [employer] or Organizations: Yes    Attends Engineer, structural: More than 4 times per year    Marital Status: Married    Tobacco  Counseling Counseling given: Not Answered   Clinical Intake:  Pre-visit preparation completed: Yes  Pain : No/denies pain Pain Score: 0-No pain    BMI - recorded: 42.62 Nutritional Status: BMI > 30  Obese Nutritional Risks: None Diabetes: No  How often do you need to have someone help you when you read instructions, pamphlets, or other written materials from your doctor or pharmacy?: 1 - Never  Interpreter Needed?: No  Comments: others live with pt Information entered by :: B.Harry Shuck,LPN   Activities of Daily Living    09/16/2023    8:28 AM  In your present state of health, do you have any difficulty performing the following activities:  Hearing? 0  Vision? 0  Difficulty concentrating or making decisions? 0  Walking or climbing stairs? 0  Dressing or bathing? 0  Doing errands, shopping? 0  Preparing Food and eating ? N  Using the Toilet? N  In the past six months, have you accidently leaked urine? N  Do you have problems with loss of bowel control? N  Managing your Medications? N  Managing your Finances? N  Housekeeping or managing your Housekeeping? N    Patient Care Team: Doreene Nest, NP as PCP - General (Internal Medicine) Debbe Odea, MD as PCP - Cardiology (Cardiology) Cherlyn Cushing, RN as Oncology Nurse Navigator  Indicate any recent Medical Services you may have received from other than [facility] providers in the past year (date may be approximate).     Assessment:   This is a routine wellness examination for Preston Thomas.  Hearing/Vision screen Hearing Screening - Comments:: Pt says he hears good Vision Screening - Comments:: Pt says his vision is good Dr,G Lyles   Goals Addressed               This Visit's Progress     Lose weight (pt-stated)   On track     Still losing and keep goal      Maintain optimal adherence to therapy   Improving     Patient is initiating therapy. Patient will maintain adherence       COMPLETED: Patient  Stated   On track     09/10/2020, I  will maintain and continue medications as prescribed.       COMPLETED: Patient Stated   On track     Would like to drink more water, eat healthier and start walking       Depression Screen    09/16/2023    8:25 AM 09/15/2023    1:50 PM 01/06/2023    8:59 AM 09/14/2022   10:24 AM 06/10/2022   10:38 AM 12/19/2021   12:00 PM 09/12/2021    8:23 AM  PHQ 2/9 Scores  PHQ - 2 Score 0 0 0 0 1 0 0  PHQ- 9 Score     4      Fall Risk    09/16/2023    8:21 AM 01/06/2023    8:59 AM 09/14/2022   10:26 AM 09/08/2022    6:37 PM 12/19/2021   12:00 PM  Fall Risk   Falls in the past year? 0 0 0 0 0  Number falls in past yr: 0 0 0 0 0  Injury with Fall? 0 0 0 0 0  Risk for fall due to : No Fall Risks No Fall Risks No Fall Risks    Follow up Education provided;Falls prevention discussed Falls evaluation completed Falls prevention discussed  Falls evaluation completed    MEDICARE RISK AT HOME: Medicare Risk at Home Any stairs in or around the home?: No If so, are there any without handrails?: No Home free of loose throw rugs in walkways, pet beds, electrical cords, etc?: Yes Adequate lighting in your home to reduce risk of falls?: Yes Life alert?: No Use of a cane, walker or w/c?: No Grab bars in the bathroom?: No Shower chair or bench in shower?: No Elevated toilet seat or a handicapped toilet?: No  TIMED UP AND GO:  Was the test performed?  No    Cognitive Function:    09/10/2020    8:20 AM  MMSE - Mini Mental State Exam  Orientation to time 5  Orientation to Place 5  Registration 3  Attention/ Calculation 5  Recall 3  Language- repeat 1        09/16/2023    8:30 AM 09/14/2022   10:28 AM  6CIT Screen  What Year? 0 points 0 points  What month? 0 points 0 points  What time? 0 points 0 points  Count back from 20 0 points 0 points  Months in reverse 0 points 0 points  Repeat phrase 0 points 0 points  Total Score 0 points 0 points     Immunizations Immunization History  Administered Date(s) Administered   Fluad Quad(high Dose 65+) 09/07/2019   Influenza,inj,Quad PF,6+ Mos 08/17/2018   PFIZER(Purple Top)SARS-COV-2 Vaccination 11/10/2019, 12/05/2019   PNEUMOCOCCAL CONJUGATE-20 12/19/2021   Pneumococcal Polysaccharide-23 08/17/2018   Td 04/26/2003    TDAP status: Up to date  Flu Vaccine status: Declined, Education has been provided regarding the importance of this vaccine but patient still declined. Advised may receive this vaccine at local pharmacy or Health Dept. Aware to provide a copy of the vaccination record if obtained from local pharmacy or Health Dept. Verbalized acceptance and understanding.  Pneumococcal vaccine status: Up to date  Covid-19 vaccine status: Declined, Education has been provided regarding the importance of this vaccine but patient still declined. Advised may receive this vaccine at local pharmacy or Health Dept.or vaccine clinic. Aware to provide a copy of the vaccination record if obtained from local pharmacy or Health Dept. Verbalized acceptance and understanding.  Qualifies for Shingles  Vaccine? Yes   Zostavax completed No   Shingrix Completed?: No.    Education has been provided regarding the importance of this vaccine. Patient has been advised to call insurance company to determine out of pocket expense if they have not yet received this vaccine. Advised may also receive vaccine at local pharmacy or Health Dept. Verbalized acceptance and understanding.  Screening Tests Health Maintenance  Topic Date Due   Zoster Vaccines- Shingrix (1 of 2) Never done   DTaP/Tdap/Td (2 - Tdap) 04/25/2013   INFLUENZA VACCINE  04/29/2023   Medicare Annual Wellness (AWV)  09/15/2024   Colonoscopy  09/28/2026   Pneumonia Vaccine 37+ Years old  Completed   Hepatitis C Screening  Completed   HPV VACCINES  Aged Out   COVID-19 Vaccine  Discontinued    Health Maintenance  Health Maintenance Due   Topic Date Due   Zoster Vaccines- Shingrix (1 of 2) Never done   DTaP/Tdap/Td (2 - Tdap) 04/25/2013   INFLUENZA VACCINE  04/29/2023    Colorectal cancer screening: Type of screening: Colonoscopy. Completed 09/28/2016. Repeat every 10 years  Lung Cancer Screening: (Low Dose CT Chest recommended if Age 63-80 years, 20 pack-year currently smoking OR have quit w/in 15years.) does not qualify.   Lung Cancer Screening Referral: no  Additional Screening:  Hepatitis C Screening: does not qualify; Completed no  Vision Screening: Recommended annual ophthalmology exams for early detection of glaucoma and other disorders of the eye. Is the patient up to date with their annual eye exam?  Yes  Who is the provider or what is the name of the office in which the patient attends annual eye exams? Dr Randon Goldsmith If pt is not established with a provider, would they like to be referred to a provider to establish care? No .   Dental Screening: Recommended annual dental exams for proper oral hygiene  Diabetic Foot Exam: n/a  Community Resource Referral / Chronic Care Management: CRR required this visit?  No   CCM required this visit?  Appt made w/PCP for PE     Plan:     I have personally reviewed and noted the following in the patient's chart:   Medical and social history Use of alcohol, tobacco or illicit drugs  Current medications and supplements including opioid prescriptions. Patient is not currently taking opioid prescriptions. Functional ability and status Nutritional status Physical activity Advanced directives List of other physicians Hospitalizations, surgeries, and ER visits in previous 12 months Vitals Screenings to include cognitive, depression, and falls Referrals and appointments  In addition, I have reviewed and discussed with patient certain preventive protocols, quality metrics, and best practice recommendations. A written personalized care plan for preventive services as well  as general preventive health recommendations were provided to patient.     Sue Lush, LPN   27/25/3664   After Visit Summary: (MyChart) Due to this being a telephonic visit, the after visit summary with patients personalized plan was offered to patient via MyChart   Nurse Notes: The patient states he is doing well and has no concerns or questions at this time.

## 2023-09-20 ENCOUNTER — Other Ambulatory Visit (HOSPITAL_COMMUNITY): Payer: Self-pay

## 2023-09-20 ENCOUNTER — Telehealth: Payer: Self-pay | Admitting: Pharmacy Technician

## 2023-09-20 NOTE — Telephone Encounter (Signed)
Oral Oncology Patient Advocate Encounter  Was successful in securing patient a $8,000 grant from [facility] to provide copayment coverage for abiraterone.  This will keep the out of pocket expense at $0.     Final approval is pending diagnosis verification.  [employer] ID: [id number]  I have spoken with the patient.   The billing information is as follows and has been shared with [facility].    RxBin: [id number] PCN: [id number] Member ID: [id number] Group ID: [id number] Dates of Eligibility: 08/21/23 through 08/19/24  Fund:  Prostate  Jinger Neighbors, CPhT-Adv Oncology Pharmacy Patient Advocate [facility] Direct Number: [phone]  Fax: [phone]

## 2023-09-26 DIAGNOSIS — G4733 Obstructive sleep apnea (adult) (pediatric): Secondary | ICD-10-CM | POA: Diagnosis not present

## 2023-09-27 NOTE — Telephone Encounter (Signed)
Oral Oncology Patient Advocate Encounter  Diagnosis confirmation submitted via online portal.  Jinger Neighbors, CPhT-Adv Oncology Pharmacy Patient Advocate [facility] Direct Number: [phone]  Fax: [phone]

## 2023-09-28 ENCOUNTER — Other Ambulatory Visit: Payer: Self-pay

## 2023-09-30 ENCOUNTER — Other Ambulatory Visit: Payer: Self-pay

## 2023-09-30 ENCOUNTER — Encounter: Payer: Self-pay | Admitting: Urology

## 2023-09-30 ENCOUNTER — Other Ambulatory Visit: Payer: Self-pay | Admitting: *Deleted

## 2023-09-30 ENCOUNTER — Other Ambulatory Visit (HOSPITAL_COMMUNITY): Payer: Self-pay

## 2023-09-30 DIAGNOSIS — E785 Hyperlipidemia, unspecified: Secondary | ICD-10-CM

## 2023-09-30 MED ORDER — TAMSULOSIN HCL 0.4 MG PO CAPS: 0.4000 mg | ORAL_CAPSULE | Freq: Every day | ORAL | 2 refills | Status: DC

## 2023-09-30 MED ORDER — ATORVASTATIN CALCIUM 20 MG PO TABS: 20.0000 mg | ORAL_TABLET | Freq: Every day | ORAL | 0 refills | Status: DC

## 2023-09-30 NOTE — Progress Notes (Signed)
 Specialty Pharmacy Ongoing Clinical Assessment Note  Preston Thomas is a 73 y.o. male who is being followed by the specialty pharmacy service for RxSp Oncology   Patient's specialty medication(s) reviewed today: Abiraterone  Acetate (ZYTIGA )   Missed doses in the last 4 weeks: 0   Patient/Caregiver did not have any additional questions or concerns.   Therapeutic benefit summary: Unable to assess   Adverse events/side effects summary: No adverse events/side effects   Patient's therapy is appropriate to: Continue    Goals Addressed             This Visit's Progress    Stabilization of disease       Patient is on track. Patient will maintain adherence         Follow up:  3 months  Mitzie GORMAN [facility] Specialty Pharmacist

## 2023-09-30 NOTE — Progress Notes (Signed)
 Specialty Pharmacy Refill Coordination Note  Preston Thomas is a 73 y.o. male contacted today regarding refills of specialty medication(s) Abiraterone  Acetate (ZYTIGA )   Patient requested Delivery   Delivery date: 10/07/23   Verified address: [address]   Medication will be filled on 10/06/23.

## 2023-09-30 NOTE — Telephone Encounter (Signed)
 Oral Oncology Patient Advocate Encounter  Diagnosis verified. Preston Thomas is active.  Jinger Neighbors, CPhT-Adv Oncology Pharmacy Patient Advocate [facility] Direct Number: [phone]  Fax: [phone]

## 2023-09-30 NOTE — Progress Notes (Signed)
 Patient started targeted therapy on 12/18 and has follow up with Dr. Cherly Hensen on 1/10.  Request sent for injection appointment to be added on 1/10 with anticipation of starting Lupron.

## 2023-10-04 NOTE — Progress Notes (Signed)
 SUBJECTIVE: Discussed the use of AI scribe software for clinical note transcription with the patient, who gave verbal consent to proceed.  Chief Complaint: Obesity  Interim History: He is down 1 lb since his last visit. Down 24 lbs overall TBW loss of 10.5% Preston Thomas, a 73 year old individual with a history of prediabetes, hyperlipidemia, intermittent lower extremity swelling, and vitamin D  deficiency, presents for a follow-up visit regarding his obesity treatment plan. He reports a successful holiday period, during which he managed to lose a pound despite travel and associated dietary challenges. He attributes this success to an increased commitment to physical activity, including weightlifting and cardio, and a continued focus on a low-carb diet.  Preston Thomas has also been managing a diagnosis of hormone sensitive prostate cancer, for which he started medication in mid-December. He is asymptomatic at this time.   He reports no significant side effects or symptoms related to the cancer or its treatment at this time. However, he has been prescribed prednisone , which he acknowledges may lead to increased hunger and water weight gain. Despite this, he reports feeling satisfied with his current dietary plan and has not noticed an increase in hunger or cravings.He will begin treatment with Lupron  every 3 months starting on 10/08/2023.   In addition to his cancer treatment, Preston Thomas has been managing his other health conditions with metformin , ergocalciferol , hydrochlorothiazide , and Lipitor. He reports running out of metformin  and is due for a refill. He also reports a change in his pharmacy due to changes in his health insurance plan.  Preston Thomas commitment to his health is evident in his proactive approach to managing his conditions. He expresses a high level of motivation to maintain his current health status and continue his weight loss journey, despite the challenges presented by his cancer  treatment and other health conditions. He expresses a desire to do everything he can to stay as healthy as possible under the circumstances.  Preston Thomas is here to discuss his progress with his obesity treatment plan. He is on the following a lower carbohydrate, vegetable and lean protein rich diet plan and states he is following his eating plan approximately 98 % of the time. He states he is exercising weight training/cardio 45 minutes 5 times per week.   OBJECTIVE: Visit Diagnoses: Problem List Items Addressed This Visit     Hyperlipidemia   Relevant Medications   hydrochlorothiazide  (MICROZIDE ) 12.5 MG capsule   Prediabetes - Primary   Relevant Medications   metFORMIN  (GLUCOPHAGE ) 500 MG tablet   Vitamin D  deficiency   Relevant Medications   Vitamin D , Ergocalciferol , (DRISDOL ) 1.25 MG (50000 UNIT) CAPS capsule   Obesity (HCC)- start BMI 44.92   Relevant Medications   metFORMIN  (GLUCOPHAGE ) 500 MG tablet   BMI 40.0-44.9, adult (HCC)  Current BMI 43.1   Relevant Medications   metFORMIN  (GLUCOPHAGE ) 500 MG tablet   Lower leg edema   Relevant Medications   hydrochlorothiazide  (MICROZIDE ) 12.5 MG capsule   Hormone sensitive prostate cancer (HCC)   Obesity 73 year old with obesity, currently on a weight loss plan. Lost one pound over the holidays. Engaging in regular exercise, including walking and weight training. Managing diet well, not experiencing excessive hunger despite starting prednisone . Discussed balance between weight training and cardio, and potential benefits of adding a weighted vest during walks. - Continue current exercise regimen (walking and weight training) - Consider adding a weighted vest for additional challenge during walks - Monitor weight and dietary intake  Prediabetes Prediabetes with an A1c  of 5.7% as of December. Currently on metformin  500 mg twice daily. Steroids may impact glucose levels. Need to monitor for increased hunger and potential rise in A1c.  Discussed possibility of increasing metformin  to 1000 mg twice daily if A1c increases. - Refill/continue metformin  500 mg twice daily - Monitor for increased hunger and recheck A1c if symptoms arise - Consider increasing metformin  to 1000 mg twice daily if A1c increases Continue working on nutrition plan to decrease simple carbohydrates, increase lean proteins and exercise to promote weight loss, improve glycemic control and prevent progression to Type 2 diabetes.   Hyperlipidemia/ Low HDL On Lipitor 20 mg daily. Overall cholesterol numbers are good, but HDL is still less than 40, increasing risk. - Continue Lipitor 20 mg daily - Monitor lipid levels Continue to work on nutrition plan -decreasing simple carbohydrates, increasing lean proteins, decreasing saturated fats and cholesterol , avoiding trans fats and exercise as able to promote weight loss, improve lipids and decrease cardiovascular risks.   Fluid Retention/Lower extremity swelling.  Intermittent lower extremity swelling, possibly exacerbated by prednisone . Managed with hydrochlorothiazide . Discussed monitoring for increased fluid retention and potential need to adjust hydrochlorothiazide  dosage. - Continue/refill hydrochlorothiazide  12.5 mg every other day - Monitor for signs of increased fluid retention  Vitamin D  Deficiency On ergocalciferol  50,000 units every other week. Vitamin D  levels have improved but need maintenance. - Continue/refill ergocalciferol  50,000 units every other week Recheck levels over the next 2-3 months.   General Health Maintenance Generally in good health and motivated to maintain health through diet and exercise. Taking omeprazole  to protect stomach from steroids. Discussed potential benefit of adding vitamin B12 500 mcg sublingual daily to improve energy levels. - Continue omeprazole  as needed - Consider adding vitamin B12 500 mcg sublingual daily to improve energy levels - Monitor B12 levels and  recheck in several months  Follow-up - Follow up in 4 weeks - Schedule next appointment for Wednesday, February 5th at 8:30 AM.   Hormone sensitive Prostate Cancer Undergoing cancer treatment with Lupron  injections and prednisone . Managing well with no current symptoms and maintaining good energy levels. Plan to start with a three-month Lupron  shot to manage remission with quality of life. Open to medication adjustments based on response. - Continue current cancer treatment regimen - Monitor for side effects, including fatigue and hypertension Plan per Oncology- Dr Tina  Current diagnosis: M1 HSPC Initial diagnosis: cT1cN0M0 PSA 12.7, GG5 (GRADES 4 + 5)  Germline testing: not yet          Somatic testing: not yet Treatment: initial treatment: 05/2019 IMRT + brachytherapy + ADT. Received final leuprolide  injection 02/10/2021  Current PSA 0.9   At risk for side effect of medication Supportive baseline bone mineral density study and then every 2 years calcium  (1000-1200 mg daily from food and supplements) and vitamin D3 (1000 IU daily) Routine dental care Control and prevent diabetes Weight-bearing exercises (30 minutes per day) Limit alcohol consumption and avoid smoking   Hormone sensitive prostate cancer (HCC) Start AAP CMP every 2 weeks for first 3 months repeat LFT today Follow PSA about every 3 months Will start Lupron  every 3 months next month   Follow up in about 4 weeks or 2 weeks after starting AAP.  CMP every 2 weeks for 3 months.  Standing CMP ordered.  Vitals Temp: 98.4 F (36.9 C) BP: 139/73 Pulse Rate: 76 SpO2: 96 %   Anthropometric Measurements Height: 4' 11 (1.499 m) Weight: 205 lb (93 kg) BMI (Calculated): 41.38  Weight at Last Visit: 206 lb Weight Lost Since Last Visit: 1 lb Weight Gained Since Last Visit: 0 Starting Weight: 229 lb Total Weight Loss (lbs): 24 lb (10.9 kg) Peak Weight: 238 lb   Body Composition  Body Fat %: 39.2 % Fat Mass  (lbs): 80.4 lbs Muscle Mass (lbs): 118.4 lbs Total Body Water (lbs): 96 lbs Visceral Fat Rating : 27   Other Clinical Data Fasting: no Labs: no Today's Visit #: 21 Starting Date: 06/10/22     ASSESSMENT AND PLAN:  Diet: Preston Thomas is currently in the action stage of change. As such, his goal is to continue with weight loss efforts. He has agreed to following a lower carbohydrate, vegetable and lean protein rich diet plan.  Exercise: Preston Thomas has been instructed to work up to a goal of 150 minutes of combined cardio and strengthening exercise per week for weight loss and overall health benefits.   Behavior Modification:  We discussed the following Behavioral Modification Strategies today: increasing lean protein intake, decreasing simple carbohydrates, increasing vegetables, increase H2O intake, increase high fiber foods, and planning for success. We discussed various medication options to help Preston Thomas with his weight loss efforts and we both agreed to continue metformin  for prediabetes management.  Return in about 4 weeks (around 11/02/2023).SABRA He was informed of the importance of frequent follow up visits to maximize his success with intensive lifestyle modifications for his multiple health conditions.  Attestation Statements:   Reviewed by clinician on day of visit: allergies, medications, problem list, medical history, surgical history, family history, social history, and previous encounter notes.   Time spent on visit including pre-visit chart review and post-visit care and charting was 48 minutes.    Sommer Spickard, PA-C

## 2023-10-05 ENCOUNTER — Ambulatory Visit (INDEPENDENT_AMBULATORY_CARE_PROVIDER_SITE_OTHER): Payer: PPO | Admitting: Physician Assistant

## 2023-10-05 ENCOUNTER — Encounter (INDEPENDENT_AMBULATORY_CARE_PROVIDER_SITE_OTHER): Payer: Self-pay | Admitting: Physician Assistant

## 2023-10-05 VITALS — BP 139/73 | HR 76 | Temp 98.4°F | Ht 59.0 in | Wt 205.0 lb

## 2023-10-05 DIAGNOSIS — Z6841 Body Mass Index (BMI) 40.0 and over, adult: Secondary | ICD-10-CM | POA: Diagnosis not present

## 2023-10-05 DIAGNOSIS — C61 Malignant neoplasm of prostate: Secondary | ICD-10-CM

## 2023-10-05 DIAGNOSIS — E785 Hyperlipidemia, unspecified: Secondary | ICD-10-CM | POA: Diagnosis not present

## 2023-10-05 DIAGNOSIS — E669 Obesity, unspecified: Secondary | ICD-10-CM

## 2023-10-05 DIAGNOSIS — R7303 Prediabetes: Secondary | ICD-10-CM

## 2023-10-05 DIAGNOSIS — E559 Vitamin D deficiency, unspecified: Secondary | ICD-10-CM

## 2023-10-05 DIAGNOSIS — Z191 Hormone sensitive malignancy status: Secondary | ICD-10-CM | POA: Diagnosis not present

## 2023-10-05 DIAGNOSIS — R600 Localized edema: Secondary | ICD-10-CM

## 2023-10-05 MED ORDER — VITAMIN D (ERGOCALCIFEROL) 1.25 MG (50000 UNIT) PO CAPS
ORAL_CAPSULE | ORAL | 0 refills | Status: DC
Start: 2023-10-05 — End: 2023-12-01

## 2023-10-05 MED ORDER — METFORMIN HCL 500 MG PO TABS: 500.0000 mg | ORAL_TABLET | Freq: Two times a day (BID) | ORAL | 0 refills | Status: DC

## 2023-10-05 MED ORDER — HYDROCHLOROTHIAZIDE 12.5 MG PO CAPS: ORAL_CAPSULE | ORAL | 0 refills | Status: DC

## 2023-10-06 ENCOUNTER — Other Ambulatory Visit: Payer: Self-pay

## 2023-10-07 ENCOUNTER — Other Ambulatory Visit: Payer: Self-pay

## 2023-10-07 DIAGNOSIS — C61 Malignant neoplasm of prostate: Secondary | ICD-10-CM

## 2023-10-08 ENCOUNTER — Inpatient Hospital Stay: Payer: HMO

## 2023-10-08 VITALS — BP 141/58 | HR 78 | Temp 97.5°F | Resp 18 | Wt 210.1 lb

## 2023-10-08 DIAGNOSIS — Z5111 Encounter for antineoplastic chemotherapy: Secondary | ICD-10-CM | POA: Insufficient documentation

## 2023-10-08 DIAGNOSIS — I159 Secondary hypertension, unspecified: Secondary | ICD-10-CM | POA: Diagnosis not present

## 2023-10-08 DIAGNOSIS — C61 Malignant neoplasm of prostate: Secondary | ICD-10-CM | POA: Insufficient documentation

## 2023-10-08 DIAGNOSIS — Z79899 Other long term (current) drug therapy: Secondary | ICD-10-CM | POA: Diagnosis not present

## 2023-10-08 DIAGNOSIS — E7849 Other hyperlipidemia: Secondary | ICD-10-CM

## 2023-10-08 DIAGNOSIS — C7951 Secondary malignant neoplasm of bone: Secondary | ICD-10-CM | POA: Insufficient documentation

## 2023-10-08 DIAGNOSIS — Z191 Hormone sensitive malignancy status: Secondary | ICD-10-CM | POA: Diagnosis not present

## 2023-10-08 DIAGNOSIS — Z9189 Other specified personal risk factors, not elsewhere classified: Secondary | ICD-10-CM | POA: Diagnosis not present

## 2023-10-08 LAB — CBC WITH DIFFERENTIAL (CANCER CENTER ONLY)
Abs Immature Granulocytes: 0.05 10*3/uL (ref 0.00–0.07)
Basophils Absolute: 0.1 10*3/uL (ref 0.0–0.1)
Basophils Relative: 1 %
Eosinophils Absolute: 0.3 10*3/uL (ref 0.0–0.5)
Eosinophils Relative: 3 %
HCT: 44.5 % (ref 39.0–52.0)
Hemoglobin: 14.9 g/dL (ref 13.0–17.0)
Immature Granulocytes: 1 %
Lymphocytes Relative: 20 %
Lymphs Abs: 1.8 10*3/uL (ref 0.7–4.0)
MCH: 29.9 pg (ref 26.0–34.0)
MCHC: 33.5 g/dL (ref 30.0–36.0)
MCV: 89.2 fL (ref 80.0–100.0)
Monocytes Absolute: 1 10*3/uL (ref 0.1–1.0)
Monocytes Relative: 11 %
Neutro Abs: 5.9 10*3/uL (ref 1.7–7.7)
Neutrophils Relative %: 64 %
Platelet Count: 299 10*3/uL (ref 150–400)
RBC: 4.99 MIL/uL (ref 4.22–5.81)
RDW: 13.5 % (ref 11.5–15.5)
WBC Count: 9.1 10*3/uL (ref 4.0–10.5)
nRBC: 0 % (ref 0.0–0.2)

## 2023-10-08 LAB — CMP (CANCER CENTER ONLY)
ALT: 32 U/L (ref 0–44)
AST: 16 U/L (ref 15–41)
Albumin: 4.1 g/dL (ref 3.5–5.0)
Alkaline Phosphatase: 58 U/L (ref 38–126)
Anion gap: 6 (ref 5–15)
BUN: 17 mg/dL (ref 8–23)
CO2: 26 mmol/L (ref 22–32)
Calcium: 9.1 mg/dL (ref 8.9–10.3)
Chloride: 109 mmol/L (ref 98–111)
Creatinine: 0.95 mg/dL (ref 0.61–1.24)
GFR, Estimated: >60 mL/min (ref >60)
Glucose, Bld: 98 mg/dL (ref 70–99)
Potassium: 3.9 mmol/L (ref 3.5–5.1)
Sodium: 141 mmol/L (ref 135–145)
Total Bilirubin: 0.6 mg/dL (ref 0.0–1.2)
Total Protein: 6.6 g/dL (ref 6.5–8.1)

## 2023-10-08 MED ORDER — LEUPROLIDE ACETATE (3 MONTH) 22.5 MG ~~LOC~~ KIT
22.5000 mg | PACK | Freq: Once | SUBCUTANEOUS | Status: AC
  Administered 2023-10-08: 22.5 mg via SUBCUTANEOUS
  Filled 2023-10-08: qty 22.5

## 2023-10-08 NOTE — Assessment & Plan Note (Signed)
 Supportive baseline bone mineral density study and then every 2 years calcium (1000-1200 mg daily from food and supplements) and vitamin D3 (1000 IU daily) Routine dental care Control and prevent diabetes Weight-bearing exercises (30 minutes per day) Limit alcohol consumption and avoid smoking

## 2023-10-08 NOTE — Assessment & Plan Note (Signed)
 Start AAP CMP every 2 weeks for first 3 months repeat LFT today Follow PSA about every 3 months Will start Lupron every 3 months any day next month

## 2023-10-08 NOTE — Assessment & Plan Note (Signed)
 Lipitor 20 mg daily No SE

## 2023-10-08 NOTE — Assessment & Plan Note (Signed)
 Continue HCTZ

## 2023-10-08 NOTE — Progress Notes (Signed)
 Patient Care Team: Gretta Comer POUR, NP as PCP - General (Internal Medicine) Darliss Rogue, MD as PCP - Cardiology (Cardiology) Vertell Pont, RN as Oncology Nurse Navigator Charmayne Molly, MD as Consulting Physician (Ophthalmology)  Clinic Day:  10/08/2023  Referring physician: Gretta Comer POUR, NP  ASSESSMENT & PLAN:   Assessment & Plan: Preston Thomas is a 73 y.o.male with history of HTN, HLD, DM, OSA [facility].   Current diagnosis: M1 HSPC Initial diagnosis: cT1cN0M0 PSA 12.7, GG5 (GRADES 4 + 5)  Germline testing: not yet          Somatic testing: not yet Treatment: initial treatment: 05/2019 IMRT + brachytherapy + ADT. Received final leuprolide  injection 02/10/2021  Current pre-AAP PSA 0.9 09/15/23 started AAP  Tolerating well.   Hormone sensitive prostate cancer (HCC) Start AAP CMP every 2 weeks for first 3 months repeat LFT today Follow PSA about every 3 months Will start Lupron  every 3 months any day next month    Hyperlipidemia Lipitor 20 mg daily No SE  At risk for side effect of medication Supportive baseline bone mineral density study and then every 2 years calcium  (1000-1200 mg daily from food and supplements) and vitamin D3 (1000 IU daily) Routine dental care Control and prevent diabetes Weight-bearing exercises (30 minutes per day) Limit alcohol consumption and avoid smoking  HTN (hypertension) Continue HCTZ   Follow up in every 2 weeks for labs for until mid March. See me every 4 weeks.  The patient understands the plans discussed today and is in agreement with them.  He knows to contact our office if he develops concerns prior to his next appointment.  [name], MD  [facility] [address] [place] [address] Dept: [phone] Dept Fax: [phone]   Orders Placed This Encounter  Procedures    CMP (Cancer Center only)    Standing Status:   Standing    Number of Occurrences:   6    Expiration Date:   10/07/2024      CHIEF COMPLAINT:  CC: mHSPC   Current Treatment:  AAP  INTERVAL HISTORY:  Preston Thomas is here today for repeat clinical assessment.   No chest pain, short of breath, headache.  BP at home between 135-140's. Mostly <140 SBP.  No muscle pain or fatigue.  No abdominal pain, nausea, vomiting, diarrhea.   No difficulty urinating, bloody urine. He drinks a lot of fluid. 60+ oz.  Staying active and goes to the gym 5-6x/week, cardio/weights training.  I have reviewed the past medical history, past surgical history, social history and family history with the patient and they are unchanged from previous note.  ALLERGIES:  is allergic to penicillins.  MEDICATIONS:  Current Outpatient Medications  Medication Sig Dispense Refill   abiraterone  acetate (ZYTIGA ) 250 MG tablet Take 4 tablets (1,000 mg total) by mouth daily. Take on an empty stomach 1 hour before or 2 hours after a meal 120 tablet 11   atorvastatin  (LIPITOR) 20 MG tablet Take 1 tablet (20 mg total) by mouth daily. for cholesterol 90 tablet 0   clobetasol  ointment (TEMOVATE ) 0.05 % Apply 1 application topically 2 (two) times daily. (Patient taking differently: Apply 1 application  topically 2 (two) times daily. As needed) 30 g 0   fluocinonide  (LIDEX ) 0.05 % external solution Apply 1 application topically 2 (two) times daily. (Patient taking differently:  Apply 1 application  topically 2 (two) times daily. As needed) 60 mL 0   fluticasone  (CUTIVATE ) 0.05 % cream Apply topically 2 (two) times daily. (Patient taking differently: Apply topically 2 (two) times daily. As needed) 30 g 0   hydrochlorothiazide  (MICROZIDE ) 12.5 MG capsule TAKE ONE CAPSULE (12.5 MG TOTAL) BY MOUTH EVERY OTHER DAY. 15 capsule 0   metFORMIN  (GLUCOPHAGE ) 500 MG tablet Take 1 tablet (500 mg total) by mouth 2 (two) times daily with a meal. 180  tablet 0   omeprazole  (PRILOSEC) 20 MG capsule Take 1 capsule (20 mg total) by mouth 2 (two) times daily before a meal. For heartburn. 180 capsule 3   predniSONE  (DELTASONE ) 5 MG tablet Take 1 tablet (5 mg total) by mouth daily with breakfast. 30 tablet 11   SUMAtriptan  (IMITREX ) 100 MG tablet TAKE 1 TABLET BY MOUTH AT MIGRAINE ONSET,MAY REPEAT IN 2 HOURS IF HEADACHE PERSISTS/RECURS *DO NOT EXCEED 100MG  IN 24 HOURS** 10 tablet 0   tadalafil  (CIALIS ) 5 MG tablet Take 1 tablet (5 mg total) by mouth daily as needed for erectile dysfunction. 30 tablet 3   tamsulosin  (FLOMAX ) 0.4 MG CAPS capsule Take 1 capsule (0.4 mg total) by mouth daily. 90 capsule 2   triamcinolone (KENALOG) 0.025 % cream Apply 1 application topically 2 (two) times daily.     vitamin B-12 (CYANOCOBALAMIN ) 500 MCG tablet Take 500 mcg by mouth daily.     vitamin C  (ASCORBIC ACID ) 250 MG tablet Take 250 mg by mouth daily.     Vitamin D , Ergocalciferol , (DRISDOL ) 1.25 MG (50000 UNIT) CAPS capsule TAKE ONE CAPSULE (50,000 UNITS TOTAL) BY MOUTH EVERY other week. 5 capsule 0   Zinc 100 MG TABS Take by mouth.     No current facility-administered medications for this visit.    HISTORY OF PRESENT ILLNESS:   Oncology History  Hormone sensitive prostate cancer (HCC)  11/25/2018 Pathology Results   Prostate biopsy diagnosis:  A. BENIGN PROSTATE TISSUE. NO EVIDENCE OF MALIGNANCY.   B.  PROSTATIC ADENOCARCINOMA. GLEASON'S SCORE 6 (GRADES 3 + 3) NOTED IN 1  OUT OF 1 SUBMITTED PROSTATE CORE SEGMENTS. APPROXIMATELY 60% OF SUBMITTED  TISSUE INVOLVED.. (GRADE GROUP 1) PERINEURAL INVASION IS PRESENT.   C.  PROSTATIC ADENOCARCINOMA. GLEASON'S SCORE 6 (GRADES 3 + 3) NOTED IN 1  OUT OF 1 SUBMITTED PROSTATE CORE SEGMENTS. APPROXIMATELY 45% OF SUBMITTED  TISSUE INVOLVED.. (GRADE GROUP 1)   D.  BENIGN CONNECTIVE TISSUE AND SMOOTH MUSCLE. NO PROSTATE TISSUE SEEN.   E.  PROSTATIC ADENOCARCINOMA. GLEASON'S SCORE 8 (GRADES 3 + 5) NOTED IN 1  OUT OF  1 SUBMITTED PROSTATE CORE SEGMENTS. APPROXIMATELY 91% OF SUBMITTED  TISSUE INVOLVED. (GRADE GROUP 4) PERINEURAL INVASION IS PRESENT.   F.  PROSTATIC ADENOCARCINOMA. GLEASON'S SCORE 7 (GRADES 3 + 4) NOTED IN 1  OUT OF 1 SUBMITTED PROSTATE CORE SEGMENTS. APPROXIMATELY 85% OF SUBMITTED  TISSUE INVOLVED.SABRA GLEASON GRADE 4 COMPRISES 30% OF THE TUMOR. (GRADE GROUP  2) PERINEURAL INVASION IS PRESENT.   G.  PROSTATIC ADENOCARCINOMA. GLEASON'S SCORE 6 (GRADES 3 + 3) NOTED IN 1  OUT OF 2 SUBMITTED PROSTATE CORE SEGMENTS. APPROXIMATELY 9% OF SUBMITTED  TISSUE INVOLVED.. (GRADE GROUP 1)   H.  PROSTATIC ADENOCARCINOMA. GLEASON'S SCORE 6 (GRADES 3 + 3) NOTED IN 1  OUT OF 1 SUBMITTED PROSTATE CORE SEGMENTS. APPROXIMATELY 13% OF SUBMITTED  TISSUE INVOLVED.. (GRADE GROUP 1) PERINEURAL INVASION IS PRESENT.   I.  PROSTATIC ADENOCARCINOMA. GLEASON'S SCORE 7 (GRADES 4 + 3)  NOTED IN 2  OUT OF 2 SUBMITTED PROSTATE CORE SEGMENTS. APPROXIMATELY 27% OF SUBMITTED  TISSUE INVOLVED.. (GRADE GROUP 3) GLEASON GRADE 4 COMPRISES 70% OF THE  TUMOR.   J. PROSTATIC ADENOCARCINOMA. GLEASON'S SCORE 9 (GRADES 4 + 5) NOTED IN 1  OUT OF 2 SUBMITTED PROSTATE CORE SEGMENTS. APPROXIMATELY 73% OF SUBMITTED  TISSUE INVOLVED.. (GRADE GROUP 5) PERINEURAL INVASION IS PRESENT.   K.  PROSTATIC ADENOCARCINOMA. GLEASON'S SCORE 8 (GRADES 3 + 5) NOTED IN 1  OUT OF 1 SUBMITTED PROSTATE CORE SEGMENTS. APPROXIMATELY 100% OF SUBMITTED  TISSUE INVOLVED.. (GRADE GROUP 4) PERINEURAL INVASION IS PRESENT.   L.  PROSTATIC ADENOCARCINOMA. GLEASON'S SCORE 7 (GRADES 4 + 3) NOTED IN 1  OUT OF 1 SUBMITTED PROSTATE CORE SEGMENTS. APPROXIMATELY 66% OF SUBMITTED  TISSUE INVOLVED.. (GRADE GROUP 3) GLEASON GRADE 4 COMPRISES 60% OF THE  TUMOR.    08/19/2023 PET scan   PSMA PET IMPRESSION: 1. No evidence of recurrence in the prostate gland. 2. Three very small lymph nodes at the level of the aortic bifurcation with intense radiotracer activity most  consistent with prostate cancer nodal metastasis. 3. One small node in the LEFT perirectal fat at 5 o'clock location in relation to teh rectum is indeterminate but suspicious. 4. No more distant metastatic nodal disease. No visceral metastasis or skeletal metastasis.   09/07/2023 Initial Diagnosis   Hormone sensitive prostate cancer (HCC)    Tumor Marker   PSA 08/18/18 11.54 11/11/18 12.7 10/23/19 0.29 02/19/20 0.3 04/22/20 0.15 09/02/20 0.11 04/23/21 0.04 11/28/21 <0.1 04/24/22 0.11 11/30/22 0.4 06/16/23 0.7 08/06/23 0.9       REVIEW OF SYSTEMS:   All relevant systems were reviewed with the patient and are negative.   VITALS:  Blood pressure (!) 141/58, pulse 78, temperature (!) 97.5 F (36.4 C), temperature source Temporal, resp. rate 18, weight 210 lb 1.6 oz (95.3 kg), SpO2 97%.  Wt Readings from Last 3 Encounters:  10/08/23 210 lb 1.6 oz (95.3 kg)  10/05/23 205 lb (93 kg)  09/16/23 211 lb (95.7 kg)    Body mass index is 42.44 kg/m.  Performance status (ECOG): 0 - Asymptomatic  PHYSICAL EXAM:   GENERAL:alert, no distress and comfortable SKIN: skin color normal EYES: normal, sclera clear OROPHARYNX: no exudate, no erythema    LUNGS: clear to auscultation with normal breathing effort.  No wheeze or rales HEART: regular rate & rhythm and no murmurs and no lower extremity edema ABDOMEN: abdomen soft, non-tender and nondistended Musculoskeletal: no edema  LABORATORY DATA:  I have reviewed the data as listed    Component Value Date/Time   NA 141 10/08/2023 0824   NA 145 (H) 09/01/2023 0853   K 3.9 10/08/2023 0824   CL 109 10/08/2023 0824   CO2 26 10/08/2023 0824   GLUCOSE 98 10/08/2023 0824   BUN 17 10/08/2023 0824   BUN 17 09/01/2023 0853   CREATININE 0.95 10/08/2023 0824   CREATININE 1.14 03/27/2022 1522   CALCIUM  9.1 10/08/2023 0824   PROT 6.6 10/08/2023 0824   PROT 6.8 09/01/2023 0853   ALBUMIN 4.1 10/08/2023 0824   ALBUMIN 4.3 09/01/2023 0853   AST 16  10/08/2023 0824   ALT 32 10/08/2023 0824   ALKPHOS 58 10/08/2023 0824   BILITOT 0.6 10/08/2023 0824   GFRNONAA >60 10/08/2023 0824   GFRAA >60 05/17/2019 0906    No results found for: SPEP, UPEP  Lab Results  Component Value Date   WBC 9.1 10/08/2023   NEUTROABS 5.9 10/08/2023  HGB 14.9 10/08/2023   HCT 44.5 10/08/2023   MCV 89.2 10/08/2023   PLT 299 10/08/2023      Chemistry      Component Value Date/Time   NA 141 10/08/2023 0824   NA 145 (H) 09/01/2023 0853   K 3.9 10/08/2023 0824   CL 109 10/08/2023 0824   CO2 26 10/08/2023 0824   BUN 17 10/08/2023 0824   BUN 17 09/01/2023 0853   CREATININE 0.95 10/08/2023 0824   CREATININE 1.14 03/27/2022 1522      Component Value Date/Time   CALCIUM  9.1 10/08/2023 0824   ALKPHOS 58 10/08/2023 0824   AST 16 10/08/2023 0824   ALT 32 10/08/2023 0824   BILITOT 0.6 10/08/2023 0824       RADIOGRAPHIC STUDIES: I have personally reviewed the radiological images as listed and agreed with the findings in the report. No results found.

## 2023-10-09 LAB — PROSTATE-SPECIFIC AG, SERUM (LABCORP): Prostate Specific Ag, Serum: 0.8 ng/mL (ref 0.0–4.0)

## 2023-10-09 LAB — TESTOSTERONE: Testosterone: <3 ng/dL — ABNORMAL LOW (ref 264–916)

## 2023-10-20 ENCOUNTER — Other Ambulatory Visit: Payer: Self-pay

## 2023-10-22 ENCOUNTER — Other Ambulatory Visit: Payer: Self-pay

## 2023-10-22 ENCOUNTER — Telehealth: Payer: Self-pay

## 2023-10-22 ENCOUNTER — Inpatient Hospital Stay: Payer: HMO

## 2023-10-22 DIAGNOSIS — Z9189 Other specified personal risk factors, not elsewhere classified: Secondary | ICD-10-CM

## 2023-10-22 DIAGNOSIS — Z5111 Encounter for antineoplastic chemotherapy: Secondary | ICD-10-CM | POA: Diagnosis not present

## 2023-10-22 LAB — CMP (CANCER CENTER ONLY)
ALT: 30 U/L (ref 0–44)
AST: 17 U/L (ref 15–41)
Albumin: 4.2 g/dL (ref 3.5–5.0)
Alkaline Phosphatase: 56 U/L (ref 38–126)
Anion gap: 6 (ref 5–15)
BUN: 18 mg/dL (ref 8–23)
CO2: 28 mmol/L (ref 22–32)
Calcium: 9.3 mg/dL (ref 8.9–10.3)
Chloride: 109 mmol/L (ref 98–111)
Creatinine: 0.94 mg/dL (ref 0.61–1.24)
GFR, Estimated: >60 mL/min (ref >60)
Glucose, Bld: 97 mg/dL (ref 70–99)
Potassium: 3.7 mmol/L (ref 3.5–5.1)
Sodium: 143 mmol/L (ref 135–145)
Total Bilirubin: 0.5 mg/dL (ref 0.0–1.2)
Total Protein: 6.6 g/dL (ref 6.5–8.1)

## 2023-10-22 NOTE — Telephone Encounter (Signed)
-----   Message from [name] sent at 10/22/2023 10:41 AM EST ----- Please let him know labs look fine today.  Will repeat in 2 weeks.  Thank you.

## 2023-10-22 NOTE — Telephone Encounter (Signed)
TC to inform of the below message by Dr. Cherly Hensen. No answer. Left VM w/ this information and office number to call if there are any questions.

## 2023-10-22 NOTE — Progress Notes (Signed)
Specialty Pharmacy Refill Coordination Note  Preston Thomas is a 73 y.o. male contacted today regarding refills of specialty medication(s) Abiraterone Acetate Preston Thomas)   Patient requested Delivery   Delivery date: 11/03/23   Verified address: [address]   Medication will be filled on 02.04.25.

## 2023-10-27 ENCOUNTER — Other Ambulatory Visit (INDEPENDENT_AMBULATORY_CARE_PROVIDER_SITE_OTHER): Payer: Self-pay | Admitting: Physician Assistant

## 2023-10-27 DIAGNOSIS — R600 Localized edema: Secondary | ICD-10-CM

## 2023-10-27 DIAGNOSIS — E559 Vitamin D deficiency, unspecified: Secondary | ICD-10-CM

## 2023-11-02 ENCOUNTER — Other Ambulatory Visit: Payer: Self-pay

## 2023-11-02 DIAGNOSIS — C61 Malignant neoplasm of prostate: Secondary | ICD-10-CM

## 2023-11-02 NOTE — Assessment & Plan Note (Signed)
 Continue HCTZ

## 2023-11-02 NOTE — Progress Notes (Signed)
 Patient Care Team: Gretta Comer POUR, NP as PCP - General (Internal Medicine) Darliss Rogue, MD as PCP - Cardiology (Cardiology) Vertell Pont, RN as Oncology Nurse Navigator Charmayne Molly, MD as Consulting Physician (Ophthalmology)  Clinic Day:  11/04/2023  Referring physician: Gretta Comer POUR, NP  ASSESSMENT & PLAN:   Assessment & Plan: Preston Thomas is a 73 y.o.male with history of HTN, HLD, DM, OSA being [facility] cancer.    Current diagnosis: M1 HSPC Initial diagnosis: cT1cN0M0 PSA 12.7, GG5 (GRADES 4 + 5)  Germline testing: not yet          Somatic testing: not yet Treatment: initial treatment: 05/2019 IMRT + brachytherapy + ADT. Received final leuprolide  injection 02/10/2021  Current pre-AAP PSA 0.9 09/15/23 started AAP   Tolerating well.   Last ADT with Lupron  on 10/08/2023  BP stable so far on AAP. Repeat CMP in 2 and 4 weeks again.  Hormone sensitive prostate cancer (HCC) Start AAP CMP every 2 weeks for first 3 months repeat LFT today  HTN (hypertension) Continue HCTZ   At risk for side effect of medication baseline bone mineral density study  calcium  (1000-1200 mg daily from food and supplements) and vitamin D3 (1000 IU daily) Routine dental care Control and prevent diabetes Weight-bearing exercises (30 minutes per day) Limit alcohol consumption and avoid smoking  Hyperlipidemia On Lipitor. No SE from adding AAP   Keep appointment. The patient understands the plans discussed today and is in agreement with them.  He knows to contact our office if he develops concerns prior to his next appointment.  [name], MD  [facility] - [facility] DEPT [facility] [address] [place] [address] Dept: [phone] Dept Fax: [phone]   Orders Placed This Encounter  Procedures   DG Bone Density    Standing Status:   Future    Expiration Date:    11/03/2024    Reason for Exam (SYMPTOM  OR DIAGNOSIS REQUIRED):   Bone density on ADT    Preferred imaging location?:   [address]      CHIEF COMPLAINT:  CC: prostate cancer  Current Treatment:  AAP with ADT  INTERVAL HISTORY:  Preston Thomas is here today for repeat clinical assessment.   Report BP is fine at home in the 130's SBP with hydrochlorothiazide . No chest pain, short of breath, palpitation, stomach, diarrhea.   No dizziness. Few headache resolved on its own.  No trouble urinating.  He attends weight loss program, exercising at least 5x/week, walking, weights.   He is taking vit D/calcium .  I have reviewed the past medical history, past surgical history, social history and family history with the patient and they are unchanged from previous note.  ALLERGIES:  is allergic to penicillins.  MEDICATIONS:  Current Outpatient Medications  Medication Sig Dispense Refill   abiraterone  acetate (ZYTIGA ) 250 MG tablet Take 4 tablets (1,000 mg total) by mouth daily. Take on an empty stomach 1 hour before or 2 hours after a meal 120 tablet 11   atorvastatin  (LIPITOR) 20 MG tablet Take 1 tablet (20 mg total) by mouth daily. for cholesterol 90 tablet 0   hydrochlorothiazide  (MICROZIDE ) 12.5 MG capsule TAKE ONE CAPSULE (12.5 MG TOTAL) BY MOUTH EVERY day 30 capsule 1   metFORMIN  (GLUCOPHAGE ) 500 MG tablet Take 1 tablet (500 mg total) by mouth 2 (two) times daily with a meal. 180 tablet 0  omeprazole  (PRILOSEC) 20 MG capsule Take 1 capsule (20 mg total) by mouth 2 (two) times daily before a meal. For heartburn. 180 capsule 3   predniSONE  (DELTASONE ) 5 MG tablet Take 1 tablet (5 mg total) by mouth daily with breakfast. 30 tablet 11   SUMAtriptan  (IMITREX ) 100 MG tablet TAKE 1 TABLET BY MOUTH AT MIGRAINE ONSET,MAY REPEAT IN 2 HOURS IF HEADACHE PERSISTS/RECURS *DO NOT EXCEED 100MG  IN 24 HOURS** 10 tablet 0   tadalafil  (CIALIS ) 5 MG tablet Take 1 tablet (5 mg total) by mouth daily as needed  for erectile dysfunction. 30 tablet 3   tamsulosin  (FLOMAX ) 0.4 MG CAPS capsule Take 1 capsule (0.4 mg total) by mouth daily. 90 capsule 2   vitamin B-12 (CYANOCOBALAMIN ) 500 MCG tablet Take 500 mcg by mouth daily.     vitamin C  (ASCORBIC ACID ) 250 MG tablet Take 250 mg by mouth daily.     Vitamin D , Ergocalciferol , (DRISDOL ) 1.25 MG (50000 UNIT) CAPS capsule TAKE ONE CAPSULE (50,000 UNITS TOTAL) BY MOUTH EVERY other week. 5 capsule 0   Zinc 100 MG TABS Take by mouth.     No current facility-administered medications for this visit.    HISTORY OF PRESENT ILLNESS:   Oncology History  Hormone sensitive prostate cancer (HCC)  11/25/2018 Pathology Results   Prostate biopsy diagnosis:  A. BENIGN PROSTATE TISSUE. NO EVIDENCE OF MALIGNANCY.   B.  PROSTATIC ADENOCARCINOMA. GLEASON'S SCORE 6 (GRADES 3 + 3) NOTED IN 1  OUT OF 1 SUBMITTED PROSTATE CORE SEGMENTS. APPROXIMATELY 60% OF SUBMITTED  TISSUE INVOLVED.. (GRADE GROUP 1) PERINEURAL INVASION IS PRESENT.   C.  PROSTATIC ADENOCARCINOMA. GLEASON'S SCORE 6 (GRADES 3 + 3) NOTED IN 1  OUT OF 1 SUBMITTED PROSTATE CORE SEGMENTS. APPROXIMATELY 45% OF SUBMITTED  TISSUE INVOLVED.. (GRADE GROUP 1)   D.  BENIGN CONNECTIVE TISSUE AND SMOOTH MUSCLE. NO PROSTATE TISSUE SEEN.   E.  PROSTATIC ADENOCARCINOMA. GLEASON'S SCORE 8 (GRADES 3 + 5) NOTED IN 1  OUT OF 1 SUBMITTED PROSTATE CORE SEGMENTS. APPROXIMATELY 91% OF SUBMITTED  TISSUE INVOLVED. (GRADE GROUP 4) PERINEURAL INVASION IS PRESENT.   F.  PROSTATIC ADENOCARCINOMA. GLEASON'S SCORE 7 (GRADES 3 + 4) NOTED IN 1  OUT OF 1 SUBMITTED PROSTATE CORE SEGMENTS. APPROXIMATELY 85% OF SUBMITTED  TISSUE INVOLVED.SABRA GLEASON GRADE 4 COMPRISES 30% OF THE TUMOR. (GRADE GROUP  2) PERINEURAL INVASION IS PRESENT.   G.  PROSTATIC ADENOCARCINOMA. GLEASON'S SCORE 6 (GRADES 3 + 3) NOTED IN 1  OUT OF 2 SUBMITTED PROSTATE CORE SEGMENTS. APPROXIMATELY 9% OF SUBMITTED  TISSUE INVOLVED.. (GRADE GROUP 1)   H.  PROSTATIC  ADENOCARCINOMA. GLEASON'S SCORE 6 (GRADES 3 + 3) NOTED IN 1  OUT OF 1 SUBMITTED PROSTATE CORE SEGMENTS. APPROXIMATELY 13% OF SUBMITTED  TISSUE INVOLVED.. (GRADE GROUP 1) PERINEURAL INVASION IS PRESENT.   I.  PROSTATIC ADENOCARCINOMA. GLEASON'S SCORE 7 (GRADES 4 + 3) NOTED IN 2  OUT OF 2 SUBMITTED PROSTATE CORE SEGMENTS. APPROXIMATELY 27% OF SUBMITTED  TISSUE INVOLVED.. (GRADE GROUP 3) GLEASON GRADE 4 COMPRISES 70% OF THE  TUMOR.   J. PROSTATIC ADENOCARCINOMA. GLEASON'S SCORE 9 (GRADES 4 + 5) NOTED IN 1  OUT OF 2 SUBMITTED PROSTATE CORE SEGMENTS. APPROXIMATELY 73% OF SUBMITTED  TISSUE INVOLVED.. (GRADE GROUP 5) PERINEURAL INVASION IS PRESENT.   K.  PROSTATIC ADENOCARCINOMA. GLEASON'S SCORE 8 (GRADES 3 + 5) NOTED IN 1  OUT OF 1 SUBMITTED PROSTATE CORE SEGMENTS. APPROXIMATELY 100% OF SUBMITTED  TISSUE INVOLVED.. (GRADE GROUP 4) PERINEURAL INVASION IS PRESENT.  L.  PROSTATIC ADENOCARCINOMA. GLEASON'S SCORE 7 (GRADES 4 + 3) NOTED IN 1  OUT OF 1 SUBMITTED PROSTATE CORE SEGMENTS. APPROXIMATELY 66% OF SUBMITTED  TISSUE INVOLVED.. (GRADE GROUP 3) GLEASON GRADE 4 COMPRISES 60% OF THE  TUMOR.    08/19/2023 PET scan   PSMA PET IMPRESSION: 1. No evidence of recurrence in the prostate gland. 2. Three very small lymph nodes at the level of the aortic bifurcation with intense radiotracer activity most consistent with prostate cancer nodal metastasis. 3. One small node in the LEFT perirectal fat at 5 o'clock location in relation to teh rectum is indeterminate but suspicious. 4. No more distant metastatic nodal disease. No visceral metastasis or skeletal metastasis.   09/07/2023 Initial Diagnosis   Hormone sensitive prostate cancer (HCC)    Tumor Marker   PSA 08/18/18 11.54 11/11/18 12.7 10/23/19 0.29 02/19/20 0.3 04/22/20 0.15 09/02/20 0.11 04/23/21 0.04 11/28/21 <0.1 04/24/22 0.11 11/30/22 0.4 06/16/23 0.7 08/06/23 0.9   10/12/2023 Cancer Staging   Staging form: Prostate, AJCC 8th Edition -  Clinical: Stage IVB (cTX, cN1, cM1a, Grade Group: 5) - Signed by Tina Pauletta BROCKS, MD on 10/12/2023 Histologic grading system: 5 grade system       REVIEW OF SYSTEMS:   All relevant systems were reviewed with the patient and are negative.   VITALS:  There were no vitals taken for this visit.  Wt Readings from Last 3 Encounters:  11/03/23 207 lb (93.9 kg)  10/08/23 210 lb 1.6 oz (95.3 kg)  10/05/23 205 lb (93 kg)    There is no height or weight on file to calculate BMI.  Performance status (ECOG): 0 - Asymptomatic  PHYSICAL EXAM:   GENERAL:alert, no distress and comfortable SKIN: skin color normal, no rashes  EYES: normal, sclera clear OROPHARYNX: no exudate, no erythema    NECK: supple,  non-tender, without nodularity LYMPH:  no palpable cervical lymphadenopathy LUNGS: clear to auscultation with normal breathing effort.  No wheeze or rales HEART: regular rate & rhythm and no murmurs and no lower extremity edema ABDOMEN: abdomen soft, non-tender and nondistended Musculoskeletal: no edema NEURO: alert, fluent speech, no focal motor/sensory deficits.  Strength and sensation equal bilaterally.  LABORATORY DATA:  I have reviewed the data as listed    Component Value Date/Time   NA 143 11/04/2023 1107   NA 145 (H) 09/01/2023 0853   K 4.0 11/04/2023 1107   CL 111 11/04/2023 1107   CO2 27 11/04/2023 1107   GLUCOSE 113 (H) 11/04/2023 1107   BUN 18 11/04/2023 1107   BUN 17 09/01/2023 0853   CREATININE 0.89 11/04/2023 1107   CREATININE 1.14 03/27/2022 1522   CALCIUM  8.9 11/04/2023 1107   PROT 6.7 11/04/2023 1107   PROT 6.8 09/01/2023 0853   ALBUMIN 4.1 11/04/2023 1107   ALBUMIN 4.3 09/01/2023 0853   AST 19 11/04/2023 1107   ALT 29 11/04/2023 1107   ALKPHOS 52 11/04/2023 1107   BILITOT 0.4 11/04/2023 1107   GFRNONAA >60 11/04/2023 1107   GFRAA >60 05/17/2019 0906    No results found for: SPEP, UPEP  Lab Results  Component Value Date   WBC 9.1 10/08/2023    NEUTROABS 5.9 10/08/2023   HGB 14.9 10/08/2023   HCT 44.5 10/08/2023   MCV 89.2 10/08/2023   PLT 299 10/08/2023      Chemistry      Component Value Date/Time   NA 143 11/04/2023 1107   NA 145 (H) 09/01/2023 0853   K 4.0 11/04/2023 1107  CL 111 11/04/2023 1107   CO2 27 11/04/2023 1107   BUN 18 11/04/2023 1107   BUN 17 09/01/2023 0853   CREATININE 0.89 11/04/2023 1107   CREATININE 1.14 03/27/2022 1522      Component Value Date/Time   CALCIUM  8.9 11/04/2023 1107   ALKPHOS 52 11/04/2023 1107   AST 19 11/04/2023 1107   ALT 29 11/04/2023 1107   BILITOT 0.4 11/04/2023 1107       RADIOGRAPHIC STUDIES: I have personally reviewed the radiological images as listed and agreed with the findings in the report. No results found.

## 2023-11-02 NOTE — Assessment & Plan Note (Signed)
Start AAP CMP every 2 weeks for first 3 months repeat LFT today

## 2023-11-03 ENCOUNTER — Ambulatory Visit (INDEPENDENT_AMBULATORY_CARE_PROVIDER_SITE_OTHER): Payer: HMO | Admitting: Physician Assistant

## 2023-11-03 ENCOUNTER — Encounter (INDEPENDENT_AMBULATORY_CARE_PROVIDER_SITE_OTHER): Payer: Self-pay | Admitting: Physician Assistant

## 2023-11-03 VITALS — BP 127/74 | HR 76 | Temp 98.4°F | Ht 59.0 in | Wt 207.0 lb

## 2023-11-03 DIAGNOSIS — Z191 Hormone sensitive malignancy status: Secondary | ICD-10-CM | POA: Diagnosis not present

## 2023-11-03 DIAGNOSIS — C61 Malignant neoplasm of prostate: Secondary | ICD-10-CM

## 2023-11-03 DIAGNOSIS — Z6841 Body Mass Index (BMI) 40.0 and over, adult: Secondary | ICD-10-CM

## 2023-11-03 DIAGNOSIS — E559 Vitamin D deficiency, unspecified: Secondary | ICD-10-CM

## 2023-11-03 DIAGNOSIS — R600 Localized edema: Secondary | ICD-10-CM

## 2023-11-03 DIAGNOSIS — R7303 Prediabetes: Secondary | ICD-10-CM

## 2023-11-03 DIAGNOSIS — E669 Obesity, unspecified: Secondary | ICD-10-CM

## 2023-11-03 MED ORDER — HYDROCHLOROTHIAZIDE 12.5 MG PO CAPS: ORAL_CAPSULE | ORAL | 1 refills | Status: DC

## 2023-11-03 NOTE — Progress Notes (Signed)
 SUBJECTIVE:  Chief Complaint: Obesity  Interim History: He is up 2 lbs since last visit.  Bio impedence scale reviewed with the patient:  Muscle mass + 1.2 lbs Adipose mass + 1.2 lbs Total body water -1.2 lbs  Preston Thomas is a 73 year old male with obesity, prediabetes, hyperlipidemia, and hormone-sensitive prostate cancer who presents for follow-up of his obesity treatment plan.  He is actively managing his obesity through a structured exercise regimen and dietary modifications. He focuses on building muscle mass with weightlifting, using four different machines with ten reps and five sets each. His diet consists of single-ingredient foods, primarily proteins, while avoiding processed foods and sweets. He monitors his blood pressure and heart rate closely during exercise, noting an increase in heart rate during his treadmill routine.  He was recently diagnosed with hormone-sensitive prostate cancer and has been restarted on treatment with Eligard . He received his first Eligard  shot about two weeks ago and is experiencing side effects such as hot flashes, headaches, and fatigue. His heart rate was elevated during his walk yesterday, reaching 135 bpm, which he attributes to the medication. He is also experiencing some swelling in his lower extremities, particularly in his ankles and wrists, and is monitoring this closely.  His past medical history includes prediabetes, hyperlipidemia, vitamin D  deficiency, and mild swelling of the lower extremities. He is currently taking metformin  without any issues and is on a regimen of vitamin D  50,000 units every other week. He also takes B12 supplements to help with energy levels. He is on a small dose of hydrochlorothiazide  to manage swelling and is monitoring his potassium levels, which were last checked at 3.7 on January 24th. No issues with his current medications.   Preston Thomas is here to discuss his progress with his obesity  treatment plan. He is on the following a lower carbohydrate, vegetable and lean protein rich diet plan and states he is following his eating plan approximately 98 % of the time. He states he is exercising walking/weight training 60 minutes 5 times per week.   OBJECTIVE: Visit Diagnoses: Problem List Items Addressed This Visit     Prediabetes - Primary   Vitamin D  deficiency   Obesity (HCC)- start BMI 44.92   BMI 40.0-44.9, adult (HCC)  Current BMI 43.1   Lower leg edema   Relevant Medications   hydrochlorothiazide  (MICROZIDE ) 12.5 MG capsule   Hormone sensitive prostate cancer [facility])   Obesity 73 year old male actively working on diet and exercise, including weight lifting and single-ingredient foods. Slight weight increase, partially due to muscle gain. Oncologist advised focusing on limiting weight gain during prostate cancer treatment. - Continue current diet and exercise regimen - Avoid processed foods - Monitor weight and muscle mass   Hormone-sensitive prostate cancer Recently diagnosed and started on Zytiga /Prednisone  daily. Experiencing hot flashes, headaches, increased heart rate during exercise, and fatigue. PSA levels have decreased. Follow-up labs on Friday with Oncology. Discussed the importance of exercise and diet to mitigate side effects and maintain health. - Continue Zytiga /Prednisone  per Oncology- Dr. Tina - Monitor side effects - Follow-up labs on Friday with Oncology    Lower extremity edema/ BP elevations on treatment  Mild swelling in lower extremities, with recent increase in wrists and ankles. Currently on hydrochlorothiazide  12.5 mg every other day and feel we need to increase to daily dosing. Discussed monitoring swelling and adjusting medication as needed. - Increase hydrochlorothiazide  12.5 mg daily dosing - Monitor swelling in lower extremities - Check potassium  levels/renal function periodically- oncology following up labs regularly during current  treatment.   Prediabetes Last A1c was 5.7- not at goal, but improved overall. Insulin  20.4- not at goal.   Medication(s): Metformin  500 mg twice daily with meals Polyphagia:No Lab Results  Component Value Date   HGBA1C 5.7 (H) 09/01/2023   HGBA1C 5.7 (H) 03/31/2023   HGBA1C 5.8 (H) 10/29/2022   HGBA1C 6.2 (H) 06/10/2022   HGBA1C 5.8 (A) 12/19/2021   Lab Results  Component Value Date   INSULIN  20.4 09/01/2023   INSULIN  19.0 03/31/2023   INSULIN  34.2 (H) 06/10/2022    Plan: Continue Metformin  500 mg twice daily with meals Continue working on nutrition plan to decrease simple carbohydrates, increase lean proteins and exercise to promote weight loss, improve glycemic control and prevent progression to Type 2 diabetes.    Vitamin D  deficiency Currently on Ergocalciferol  50,000 units of vitamin D  every other week. No changes needed. - Continue Ergocalciferol  50,000 units of vitamin D  every other week Recheck vitamin D  levels periodically to optimize supplementation.   General Health Maintenance Maintaining a healthy diet and exercise regimen. Taking B12 supplements for energy. - Continue B12 supplements - Maintain healthy diet and exercise regimen  Follow-up - Follow-up appointment in four weeks on March 5th at 8:30 AM.  Vitals Temp: 98.4 F (36.9 C) BP: 127/74 Pulse Rate: 76 SpO2: 97 %   Anthropometric Measurements Height: 4' 11 (1.499 m) Weight: 207 lb (93.9 kg) BMI (Calculated): 41.79 Weight at Last Visit: 205 lb Weight Lost Since Last Visit: 0 Weight Gained Since Last Visit: 2 lb Starting Weight: 229 lb Total Weight Loss (lbs): 22 lb (9.979 kg) Peak Weight: 238 lb   Body Composition  Body Fat %: 39.3 % Fat Mass (lbs): 81.6 lbs Muscle Mass (lbs): 119.6 lbs Total Body Water (lbs): 94.8 lbs Visceral Fat Rating : 27   Other Clinical Data Fasting: no Labs: no Today's Visit #: 22 Starting Date: 06/10/22     ASSESSMENT AND PLAN:  Diet: Preston Thomas is  currently in the action stage of change. As such, his goal is to continue with weight loss efforts. He has agreed to following a lower carbohydrate, vegetable and lean protein rich diet plan.  Exercise: Preston Thomas has been instructed to continue exercising as is for weight loss and overall health benefits.   Behavior Modification:  We discussed the following Behavioral Modification Strategies today: increasing lean protein intake, decreasing simple carbohydrates, increasing vegetables, increase H2O intake, increase high fiber foods, no skipping meals, avoiding temptations, and planning for success. We discussed various medication options to help Preston with his weight loss efforts and we both agreed to continue metformin  for primary indication of prediabetes and continue to work on nutritional and behavioral strategies to promote weight loss.  .  Return in about 4 weeks (around 12/01/2023).SABRA He was informed of the importance of frequent follow up visits to maximize his success with intensive lifestyle modifications for his multiple health conditions.  Attestation Statements:   Reviewed by clinician on day of visit: allergies, medications, problem list, medical history, surgical history, family history, social history, and previous encounter notes.   Time spent on visit including pre-visit chart review and post-visit care and charting was 31 minutes.    Delainey Winstanley, PA-C

## 2023-11-04 ENCOUNTER — Inpatient Hospital Stay (HOSPITAL_BASED_OUTPATIENT_CLINIC_OR_DEPARTMENT_OTHER): Payer: HMO

## 2023-11-04 ENCOUNTER — Inpatient Hospital Stay: Payer: HMO

## 2023-11-04 DIAGNOSIS — Z191 Hormone sensitive malignancy status: Secondary | ICD-10-CM | POA: Diagnosis not present

## 2023-11-04 DIAGNOSIS — I10 Essential (primary) hypertension: Secondary | ICD-10-CM | POA: Diagnosis not present

## 2023-11-04 DIAGNOSIS — E7849 Other hyperlipidemia: Secondary | ICD-10-CM

## 2023-11-04 DIAGNOSIS — I159 Secondary hypertension, unspecified: Secondary | ICD-10-CM | POA: Diagnosis not present

## 2023-11-04 DIAGNOSIS — E785 Hyperlipidemia, unspecified: Secondary | ICD-10-CM | POA: Insufficient documentation

## 2023-11-04 DIAGNOSIS — Z87891 Personal history of nicotine dependence: Secondary | ICD-10-CM | POA: Insufficient documentation

## 2023-11-04 DIAGNOSIS — C61 Malignant neoplasm of prostate: Secondary | ICD-10-CM | POA: Insufficient documentation

## 2023-11-04 DIAGNOSIS — Z79899 Other long term (current) drug therapy: Secondary | ICD-10-CM | POA: Diagnosis not present

## 2023-11-04 DIAGNOSIS — Z9189 Other specified personal risk factors, not elsewhere classified: Secondary | ICD-10-CM

## 2023-11-04 LAB — CMP (CANCER CENTER ONLY)
ALT: 29 U/L (ref 0–44)
AST: 19 U/L (ref 15–41)
Albumin: 4.1 g/dL (ref 3.5–5.0)
Alkaline Phosphatase: 52 U/L (ref 38–126)
Anion gap: 5 (ref 5–15)
BUN: 18 mg/dL (ref 8–23)
CO2: 27 mmol/L (ref 22–32)
Calcium: 8.9 mg/dL (ref 8.9–10.3)
Chloride: 111 mmol/L (ref 98–111)
Creatinine: 0.89 mg/dL (ref 0.61–1.24)
GFR, Estimated: >60 mL/min (ref >60)
Glucose, Bld: 113 mg/dL — ABNORMAL HIGH (ref 70–99)
Potassium: 4 mmol/L (ref 3.5–5.1)
Sodium: 143 mmol/L (ref 135–145)
Total Bilirubin: 0.4 mg/dL (ref 0.0–1.2)
Total Protein: 6.7 g/dL (ref 6.5–8.1)

## 2023-11-04 NOTE — Assessment & Plan Note (Signed)
 On Lipitor. No SE from adding AAP

## 2023-11-04 NOTE — Assessment & Plan Note (Signed)
 baseline bone mineral density study  calcium  (1000-1200 mg daily from food and supplements) and vitamin D3 (1000 IU daily) Routine dental care Control and prevent diabetes Weight-bearing exercises (30 minutes per day) Limit alcohol consumption and avoid smoking

## 2023-11-05 LAB — PROSTATE-SPECIFIC AG, SERUM (LABCORP): Prostate Specific Ag, Serum: 0.2 ng/mL (ref 0.0–4.0)

## 2023-11-05 LAB — TESTOSTERONE: Testosterone: <3 ng/dL — ABNORMAL LOW (ref 264–916)

## 2023-11-15 ENCOUNTER — Other Ambulatory Visit (HOSPITAL_COMMUNITY): Payer: Self-pay

## 2023-11-15 ENCOUNTER — Other Ambulatory Visit: Payer: Self-pay | Admitting: Primary Care

## 2023-11-15 DIAGNOSIS — E785 Hyperlipidemia, unspecified: Secondary | ICD-10-CM

## 2023-11-15 DIAGNOSIS — G43909 Migraine, unspecified, not intractable, without status migrainosus: Secondary | ICD-10-CM

## 2023-11-15 MED ORDER — SUMATRIPTAN SUCCINATE 100 MG PO TABS
ORAL_TABLET | ORAL | 0 refills | Status: DC
  Filled 2023-11-15: qty 9, 9d supply, fill #0
  Filled 2024-07-01: qty 9, 9d supply, fill #1

## 2023-11-15 NOTE — Telephone Encounter (Signed)
 [email or web address]

## 2023-11-15 NOTE — Telephone Encounter (Signed)
 Patient is due for CPE/follow up in late April, this will be required prior to any further refills.  Please schedule, thank you!

## 2023-11-19 ENCOUNTER — Inpatient Hospital Stay: Payer: HMO

## 2023-11-19 DIAGNOSIS — Z9189 Other specified personal risk factors, not elsewhere classified: Secondary | ICD-10-CM

## 2023-11-19 DIAGNOSIS — C61 Malignant neoplasm of prostate: Secondary | ICD-10-CM | POA: Diagnosis not present

## 2023-11-19 LAB — CMP (CANCER CENTER ONLY)
ALT: 24 U/L (ref 0–44)
AST: 14 U/L — ABNORMAL LOW (ref 15–41)
Albumin: 4 g/dL (ref 3.5–5.0)
Alkaline Phosphatase: 55 U/L (ref 38–126)
Anion gap: 5 (ref 5–15)
BUN: 15 mg/dL (ref 8–23)
CO2: 27 mmol/L (ref 22–32)
Calcium: 8.7 mg/dL — ABNORMAL LOW (ref 8.9–10.3)
Chloride: 108 mmol/L (ref 98–111)
Creatinine: 0.86 mg/dL (ref 0.61–1.24)
GFR, Estimated: >60 mL/min (ref >60)
Glucose, Bld: 87 mg/dL (ref 70–99)
Potassium: 3.9 mmol/L (ref 3.5–5.1)
Sodium: 140 mmol/L (ref 135–145)
Total Bilirubin: 0.5 mg/dL (ref 0.0–1.2)
Total Protein: 6 g/dL — ABNORMAL LOW (ref 6.5–8.1)

## 2023-11-25 ENCOUNTER — Other Ambulatory Visit: Payer: Self-pay

## 2023-11-25 ENCOUNTER — Other Ambulatory Visit (HOSPITAL_COMMUNITY): Payer: Self-pay

## 2023-11-25 ENCOUNTER — Other Ambulatory Visit: Payer: Self-pay | Admitting: Pharmacy Technician

## 2023-11-25 NOTE — Progress Notes (Signed)
 Specialty Pharmacy Ongoing Clinical Assessment Note  Preston Thomas is a 73 y.o. male who is being followed by the specialty pharmacy service for RxSp Oncology   Patient's specialty medication(s) reviewed today: Abiraterone Acetate (ZYTIGA)   Missed doses in the last 4 weeks: 0   Patient/Caregiver did not have any additional questions or concerns.   Therapeutic benefit summary: Patient is achieving benefit   Adverse events/side effects summary: No adverse events/side effects   Patient's therapy is appropriate to: Continue    Goals Addressed             This Visit's Progress    Maintain optimal adherence to therapy   On track    Patient is initiating therapy. Patient will maintain adherence         Follow up:  3 months  Servando Snare Specialty Pharmacist

## 2023-11-25 NOTE — Progress Notes (Signed)
 Specialty Pharmacy Refill Coordination Note  Preston Thomas is a 73 y.o. male contacted today regarding refills of specialty medication(s) Abiraterone Acetate [facility]aser)   Patient requested Delivery   Delivery date: 11/30/23   Verified address: [address] [place]   Medication will be filled on 11/29/23.

## 2023-11-29 ENCOUNTER — Other Ambulatory Visit: Payer: Self-pay

## 2023-12-01 ENCOUNTER — Encounter (INDEPENDENT_AMBULATORY_CARE_PROVIDER_SITE_OTHER): Payer: Self-pay | Admitting: Physician Assistant

## 2023-12-01 ENCOUNTER — Ambulatory Visit (INDEPENDENT_AMBULATORY_CARE_PROVIDER_SITE_OTHER): Payer: HMO | Admitting: Physician Assistant

## 2023-12-01 ENCOUNTER — Other Ambulatory Visit (HOSPITAL_COMMUNITY): Payer: Self-pay

## 2023-12-01 VITALS — BP 130/79 | HR 79 | Temp 97.5°F | Ht 59.0 in | Wt 210.0 lb

## 2023-12-01 DIAGNOSIS — E559 Vitamin D deficiency, unspecified: Secondary | ICD-10-CM

## 2023-12-01 DIAGNOSIS — C61 Malignant neoplasm of prostate: Secondary | ICD-10-CM | POA: Diagnosis not present

## 2023-12-01 DIAGNOSIS — Z191 Hormone sensitive malignancy status: Secondary | ICD-10-CM | POA: Diagnosis not present

## 2023-12-01 DIAGNOSIS — R600 Localized edema: Secondary | ICD-10-CM

## 2023-12-01 DIAGNOSIS — R7303 Prediabetes: Secondary | ICD-10-CM

## 2023-12-01 DIAGNOSIS — E669 Obesity, unspecified: Secondary | ICD-10-CM | POA: Diagnosis not present

## 2023-12-01 DIAGNOSIS — Z6841 Body Mass Index (BMI) 40.0 and over, adult: Secondary | ICD-10-CM | POA: Diagnosis not present

## 2023-12-01 MED ORDER — VITAMIN D (ERGOCALCIFEROL) 1.25 MG (50000 UNIT) PO CAPS
50000.0000 [IU] | ORAL_CAPSULE | ORAL | 0 refills | Status: DC
  Filled 2023-12-01: qty 5, 70d supply, fill #0

## 2023-12-01 MED ORDER — HYDROCHLOROTHIAZIDE 12.5 MG PO CAPS
12.5000 mg | ORAL_CAPSULE | Freq: Every day | ORAL | 1 refills | Status: DC
  Filled 2023-12-01 (×2): qty 30, 30d supply, fill #0

## 2023-12-01 NOTE — Progress Notes (Signed)
 SUBJECTIVE: Discussed the use of AI scribe software for clinical note transcription with the patient, who gave verbal consent to proceed.  Chief Complaint: Obesity  Interim History: He is up 3 lbs since last visit. Bio impedence scale reviewed with the patient:  Muscle mass + 0.2 lbs Adipose mass + 2.2 lbs Total body water + 2.6 lbs ( on prednisone as part of therapy for Preston Thomas)  Preston Thomas is here to discuss his progress with his obesity treatment plan. He is on the following a lower carbohydrate, vegetable and lean protein rich diet plan and states he is following his eating plan approximately 98 % of the time. He states he is exercising Strength training/walking 45+ minutes 5 times per week. Preston Thomas "Preston Thomas" is a 73 year old male with hormone-sensitive prostate cancer who presents for follow-up of his obesity treatment plan.  He is currently undergoing treatment for hormone-sensitive prostate cancer, which has contributed to weight gain. Over the past several weeks, he has experienced a slight increase in adipose tissue but has maintained or built some muscle mass over the past four weeks.  He experiences fatigue, described as a general malaise, but it does not affect his ability to exercise. He is actively engaging in physical activity, including weight training at the [facility], which he finds beneficial for his overall well-being.  He mentions experiencing hot flashes, which he attributes to his cancer treatment. He is part of a support group and acknowledges that while the side effects are unpleasant, they are not debilitating. He is motivated to continue treatment despite these challenges.  He notes that his PSA levels have significantly dropped. He is scheduled to see his oncologist soon to discuss the management of his prostate cancer and any potential adjustments to his treatment plan.  He is currently taking vitamin B12, vitamin D, hydrochlorothiazide,  and metformin without any reported issues. He also takes 1200 mg of calcium daily, which is an over-the-counter supplement. He monitors his blood pressure, which has been stable around 130/80 mmHg.  OBJECTIVE: Visit Diagnoses: Problem List Items Addressed This Visit     Prediabetes - Primary   Vitamin D deficiency   Relevant Medications   Vitamin D, Ergocalciferol, (DRISDOL) 1.25 MG (50000 UNIT) CAPS capsule   Obesity (HCC)- start BMI 44.92   BMI 40.0-44.9, adult (HCC)  Current BMI 43.1   Lower leg edema   Relevant Medications   hydrochlorothiazide (MICROZIDE) 12.5 MG capsule   Hormone sensitive prostate cancer (HCC)  esity Experiencing weight gain likely due to hormone-sensitive prostate cancer treatments. Despite increased adipose tissue, he has maintained or built muscle mass over the past four weeks. Weight gain is attributed to side effects of cancer treatment. Focus is on maintaining health through the process. - Continue current exercise regimen, including weight training at the [facility] - Monitor weight and muscle mass - Encourage maintaining current diet and exercise plan  Lower extremity edema Experiences lower extremity edema, possibly related to low protein levels. Increasing protein intake to manage condition. BP okay with recent treatments and hydrochlorothiazide helping with increased edema with treatments. Was increased from every other day dosing to daily. Last potassium level stable without supplementation.  Continue to work on nutrition plan to promote weight loss and improve BP control and edema control.  - Monitor for changes in edema  Prediabetes On metformin 500 mg BID for management with no issues reported.  Lab Results  Component Value Date   HGBA1C  5.7 (H) 09/01/2023   HGBA1C 5.7 (H) 03/31/2023   HGBA1C 5.8 (H) 10/29/2022   Lab Results  Component Value Date   LDLCALC 81 09/01/2023   CREATININE 0.86 11/19/2023   INSULIN  Date Value Ref  Range Status  09/01/2023 20.4 2.6 - 24.9 uIU/mL Final  ] - Continue metformin 500 mg BID. No refill needed this visit.  Continue working on nutrition plan to decrease simple carbohydrates, increase lean proteins and exercise to promote weight loss, improve glycemic control and prevent progression to Type 2 diabetes.    Vitamin D deficiency On vitamin D supplementation Ergocalciferol 50,000 units weekly and requires a refill. No N/V or muscle weakness with Ergo.  Last vitamin D Lab Results  Component Value Date   VD25OH 69.1 09/01/2023   Lab Results  Component Value Date   CALCIUM 8.7 (L) 11/19/2023     Calcium levels slightly low, taking 1200 mg of calcium daily. - Refill/continue Ergocalciferol 50,000 units weekly.  - Continue calcium supplementation at 1200 mg daily Continue periodic monitoring of levels along with Oncology.   Hormone-sensitive prostate cancer Undergoing androgen deprivation therapy (ADT) with a significant drop in PSA levels, indicating a positive response.  Lab Results  Component Value Date   PSA1 0.2 11/04/2023   PSA1 0.8 10/08/2023   PSA1 0.9 09/09/2023   PSA 0.11 09/02/2020   PSA 11.54 (H) 08/17/2018     Chemistry      Component Value Date/Time   NA 140 11/19/2023 0858   NA 145 (H) 09/01/2023 0853   K 3.9 11/19/2023 0858   CL 108 11/19/2023 0858   CO2 27 11/19/2023 0858   BUN 15 11/19/2023 0858   BUN 17 09/01/2023 0853   CREATININE 0.86 11/19/2023 0858   CREATININE 1.14 03/27/2022 1522      Component Value Date/Time   CALCIUM 8.7 (L) 11/19/2023 0858   ALKPHOS 55 11/19/2023 0858   AST 14 (L) 11/19/2023 0858   ALT 24 11/19/2023 0858   BILITOT 0.5 11/19/2023 0858     Experiencing side effects such as fatigue, general malaise, and hot flashes. Managing side effects and part of a support group. Treatment plan aims to balance remission with quality of life. - Continue current cancer treatment regimen - Attend follow-up appointment with  oncologist    Vitals Temp: (!) 97.5 F (36.4 C) BP: 130/79 Pulse Rate: 79 SpO2: 95 %   Anthropometric Measurements Height: 4\' 11"  (1.499 m) Weight: 210 lb (95.3 kg) BMI (Calculated): 42.39 Weight at Last Visit: 207lb Weight Lost Since Last Visit: 0 Weight Gained Since Last Visit: 3lb Starting Weight: 229lb Total Weight Loss (lbs): 19 lb (8.618 kg) Peak Weight: 238lb   Body Composition  Body Fat %: 39.9 % Fat Mass (lbs): 83.8 lbs Muscle Mass (lbs): 119.8 lbs Total Body Water (lbs): 97.4 lbs Visceral Fat Rating : 28   Other Clinical Data Fasting: no Labs: no Today's Visit #: 23 Starting Date: 06/10/22     ASSESSMENT AND PLAN:  Diet: Navon is currently in the action stage of change. As such, his goal is to continue with weight loss efforts and has agreed to following a lower carbohydrate, vegetable and lean protein rich diet plan.   Exercise:  Older adults should follow the adult guidelines. When older adults cannot meet the adult guidelines, they should be as physically active as their abilities and conditions will allow. and Older adults should determine their level of effort for physical activity relative to their level of fitness.  Behavior Modification:  We discussed the following Behavioral Modification Strategies today: increasing lean protein intake, decreasing simple carbohydrates, increasing vegetables, increase H2O intake, increase high fiber foods, avoiding temptations, and planning for success. We discussed various medication options to help Preston Thomas with his weight loss efforts and we both agreed to continue metformin for primary indications of prediabetes and insulin resistance and continue to work on nutritional and behavioral strategies to promote weight loss.  .  Return in about 4 weeks (around 12/29/2023).[name] He was informed of the importance of frequent follow up visits to maximize his success with intensive lifestyle modifications for his multiple  health conditions.  Attestation Statements:   Reviewed by clinician on day of visit: allergies, medications, problem list, medical history, surgical history, family history, social history, and previous encounter notes.   Time spent on visit including pre-visit chart review and post-visit care and charting was 37 minutes  Reuben Likes, MD

## 2023-12-03 ENCOUNTER — Telehealth: Payer: Self-pay

## 2023-12-03 ENCOUNTER — Inpatient Hospital Stay: Payer: HMO

## 2023-12-03 VITALS — BP 137/66 | HR 74 | Temp 97.3°F | Resp 16 | Ht 59.0 in | Wt 215.2 lb

## 2023-12-03 DIAGNOSIS — C61 Malignant neoplasm of prostate: Secondary | ICD-10-CM | POA: Insufficient documentation

## 2023-12-03 DIAGNOSIS — Z191 Hormone sensitive malignancy status: Secondary | ICD-10-CM | POA: Diagnosis not present

## 2023-12-03 DIAGNOSIS — I10 Essential (primary) hypertension: Secondary | ICD-10-CM | POA: Diagnosis not present

## 2023-12-03 DIAGNOSIS — I159 Secondary hypertension, unspecified: Secondary | ICD-10-CM | POA: Diagnosis not present

## 2023-12-03 DIAGNOSIS — Z79899 Other long term (current) drug therapy: Secondary | ICD-10-CM | POA: Insufficient documentation

## 2023-12-03 DIAGNOSIS — Z87891 Personal history of nicotine dependence: Secondary | ICD-10-CM | POA: Insufficient documentation

## 2023-12-03 DIAGNOSIS — E785 Hyperlipidemia, unspecified: Secondary | ICD-10-CM | POA: Insufficient documentation

## 2023-12-03 DIAGNOSIS — Z9189 Other specified personal risk factors, not elsewhere classified: Secondary | ICD-10-CM

## 2023-12-03 DIAGNOSIS — E7849 Other hyperlipidemia: Secondary | ICD-10-CM

## 2023-12-03 LAB — CMP (CANCER CENTER ONLY)
ALT: 23 U/L (ref 0–44)
AST: 14 U/L — ABNORMAL LOW (ref 15–41)
Albumin: 4.1 g/dL (ref 3.5–5.0)
Alkaline Phosphatase: 62 U/L (ref 38–126)
Anion gap: 5 (ref 5–15)
BUN: 16 mg/dL (ref 8–23)
CO2: 30 mmol/L (ref 22–32)
Calcium: 8.9 mg/dL (ref 8.9–10.3)
Chloride: 109 mmol/L (ref 98–111)
Creatinine: 0.92 mg/dL (ref 0.61–1.24)
GFR, Estimated: >60 mL/min (ref >60)
Glucose, Bld: 107 mg/dL — ABNORMAL HIGH (ref 70–99)
Potassium: 3.9 mmol/L (ref 3.5–5.1)
Sodium: 144 mmol/L (ref 135–145)
Total Bilirubin: 0.4 mg/dL (ref 0.0–1.2)
Total Protein: 6.6 g/dL (ref 6.5–8.1)

## 2023-12-03 NOTE — Assessment & Plan Note (Signed)
 On Lipitor. No SE from adding AAP

## 2023-12-03 NOTE — Progress Notes (Signed)
 Patient Care Team: Doreene Nest, NP as PCP - General (Internal Medicine) Debbe Odea, MD as PCP - Cardiology (Cardiology) Cherlyn Cushing, RN as Oncology Nurse Navigator Antony Contras, MD as Consulting Physician (Ophthalmology)  Clinic Day:  12/03/2023  Referring physician: Doreene Nest, NP  ASSESSMENT & PLAN:   Assessment & Plan: Preston Thomas is a 73 y.o.male with history of HTN, HLD, DM, OSA [facility].    Current diagnosis: M1 HSPC Initial diagnosis: [id number] PSA 12.7, GG5 (GRADES 4 + 5)  Germline testing: not yet          Somatic testing: not yet Treatment: initial treatment: 05/2019 IMRT + brachytherapy + ADT. Received final leuprolide injection 02/10/2021  Current pre-AAP PSA 0.9 09/15/23 started AAP   Tolerating well.   Last ADT with Lupron on 10/08/2023  BP stable so far on AAP. LFT normal.  Gets phone call monthly for delivery.  Hormone sensitive prostate cancer (HCC) Continue AAP Repeat lab in 3 months.  Hyperlipidemia On Lipitor. No SE from adding AAP  HTN (hypertension) BP stable at home with abiraterone Continue HCTZ 12.5 mg daily  See me on 3/27 The patient understands the plans discussed today and is in agreement with them.  He knows to contact our office if he develops concerns prior to his next appointment.  [name], MD  [facility] - A DEPT OF [facility] [address] [place] [address] Dept: [phone] Dept Fax: [phone]   Orders Placed This Encounter  Procedures   CBC with Differential (Cancer Center Only)    Standing Status:   Future    Expiration Date:   12/02/2024   CMP (Cancer Center only)    Standing Status:   Future    Expiration Date:   12/02/2024   Testosterone    Standing Status:   Future    Expiration Date:   12/02/2024   Prostate-Specific AG, Serum    Standing Status:   Future    Expiration  Date:   12/02/2024      CHIEF COMPLAINT:  CC: prostate cancer  Current Treatment:  AAP with ADT  INTERVAL HISTORY:  Preston Thomas is here today for repeat clinical assessment.   A little fatigue but still working out and not affecting his daily life.  Report BP is fine at home in the 130's SBP with hydrochlorothiazide.  No muscle ache.  No chest pain, short of breath, palpitation, stomach pain, diarrhea.   No trouble urinating.  He attends weight loss program, exercising at least 5x/week, walking, weights.   He is taking vit D/calcium.  I have reviewed the past medical history, past surgical history, social history and family history with the patient and they are unchanged from previous note.  ALLERGIES:  is allergic to penicillins.  MEDICATIONS:  Current Outpatient Medications  Medication Sig Dispense Refill   abiraterone acetate (ZYTIGA) 250 MG tablet Take 4 tablets (1,000 mg total) by mouth daily. Take on an empty stomach 1 hour before or 2 hours after a meal 120 tablet 11   atorvastatin (LIPITOR) 20 MG tablet Take 1 tablet (20 mg total) by mouth daily. for cholesterol 90 tablet 0   hydrochlorothiazide (MICROZIDE) 12.5 MG capsule Take 1 capsule (12.5 mg total) by mouth daily. 30 capsule 1   metFORMIN (GLUCOPHAGE) 500 MG tablet Take 1 tablet (500 mg total) by mouth 2 (two) times daily with a  meal. 180 tablet 0   omeprazole (PRILOSEC) 20 MG capsule Take 1 capsule (20 mg total) by mouth 2 (two) times daily before a meal. For heartburn. 180 capsule 3   predniSONE (DELTASONE) 5 MG tablet Take 1 tablet (5 mg total) by mouth daily with breakfast. 30 tablet 11   SUMAtriptan (IMITREX) 100 MG tablet TAKE 1 TABLET BY MOUTH AT MIGRAINE ONSET,MAY REPEAT IN 2 HOURS IF HEADACHE PERSISTS/RECURS *DO NOT EXCEED 100MG  IN 24 HOURS** 10 tablet 0   tadalafil (CIALIS) 5 MG tablet Take 1 tablet (5 mg total) by mouth daily as needed for erectile dysfunction. 30 tablet 3   tamsulosin (FLOMAX) 0.4 MG CAPS  capsule Take 1 capsule (0.4 mg total) by mouth daily. 90 capsule 2   vitamin B-12 (CYANOCOBALAMIN) 500 MCG tablet Take 500 mcg by mouth daily.     vitamin C (ASCORBIC ACID) 250 MG tablet Take 250 mg by mouth daily.     Vitamin D, Ergocalciferol, (DRISDOL) 1.25 MG (50000 UNIT) CAPS capsule Take 1 capsule (50,000 Units total) by mouth every other week. 5 capsule 0   Zinc 100 MG TABS Take by mouth.     No current facility-administered medications for this visit.    HISTORY OF PRESENT ILLNESS:   Oncology History  Hormone sensitive prostate cancer (HCC)  11/25/2018 Pathology Results   Prostate biopsy diagnosis:  A. BENIGN PROSTATE TISSUE. NO EVIDENCE OF MALIGNANCY.   B.  PROSTATIC ADENOCARCINOMA. GLEASON'S SCORE 6 (GRADES 3 + 3) NOTED IN 1  OUT OF 1 SUBMITTED PROSTATE CORE SEGMENTS. APPROXIMATELY 60% OF SUBMITTED  TISSUE INVOLVED.. (GRADE GROUP 1) PERINEURAL INVASION IS PRESENT.   C.  PROSTATIC ADENOCARCINOMA. GLEASON'S SCORE 6 (GRADES 3 + 3) NOTED IN 1  OUT OF 1 SUBMITTED PROSTATE CORE SEGMENTS. APPROXIMATELY 45% OF SUBMITTED  TISSUE INVOLVED.. (GRADE GROUP 1)   D.  BENIGN CONNECTIVE TISSUE AND SMOOTH MUSCLE. NO PROSTATE TISSUE SEEN.   E.  PROSTATIC ADENOCARCINOMA. GLEASON'S SCORE 8 (GRADES 3 + 5) NOTED IN 1  OUT OF 1 SUBMITTED PROSTATE CORE SEGMENTS. APPROXIMATELY 91% OF SUBMITTED  TISSUE INVOLVED. (GRADE GROUP 4) PERINEURAL INVASION IS PRESENT.   F.  PROSTATIC ADENOCARCINOMA. GLEASON'S SCORE 7 (GRADES 3 + 4) NOTED IN 1  OUT OF 1 SUBMITTED PROSTATE CORE SEGMENTS. APPROXIMATELY 85% OF SUBMITTED  TISSUE INVOLVED.[name] GLEASON GRADE 4 COMPRISES 30% OF THE TUMOR. (GRADE GROUP  2) PERINEURAL INVASION IS PRESENT.   G.  PROSTATIC ADENOCARCINOMA. GLEASON'S SCORE 6 (GRADES 3 + 3) NOTED IN 1  OUT OF 2 SUBMITTED PROSTATE CORE SEGMENTS. APPROXIMATELY 9% OF SUBMITTED  TISSUE INVOLVED.. (GRADE GROUP 1)   H.  PROSTATIC ADENOCARCINOMA. GLEASON'S SCORE 6 (GRADES 3 + 3) NOTED IN 1  OUT OF 1 SUBMITTED  PROSTATE CORE SEGMENTS. APPROXIMATELY 13% OF SUBMITTED  TISSUE INVOLVED.. (GRADE GROUP 1) PERINEURAL INVASION IS PRESENT.   I.  PROSTATIC ADENOCARCINOMA. GLEASON'S SCORE 7 (GRADES 4 + 3) NOTED IN 2  OUT OF 2 SUBMITTED PROSTATE CORE SEGMENTS. APPROXIMATELY 27% OF SUBMITTED  TISSUE INVOLVED.. (GRADE GROUP 3) GLEASON GRADE 4 COMPRISES 70% OF THE  TUMOR.   J. PROSTATIC ADENOCARCINOMA. GLEASON'S SCORE 9 (GRADES 4 + 5) NOTED IN 1  OUT OF 2 SUBMITTED PROSTATE CORE SEGMENTS. APPROXIMATELY 73% OF SUBMITTED  TISSUE INVOLVED.. (GRADE GROUP 5) PERINEURAL INVASION IS PRESENT.   K.  PROSTATIC ADENOCARCINOMA. GLEASON'S SCORE 8 (GRADES 3 + 5) NOTED IN 1  OUT OF 1 SUBMITTED PROSTATE CORE SEGMENTS. APPROXIMATELY 100% OF SUBMITTED  TISSUE INVOLVED.. (GRADE GROUP  4) PERINEURAL INVASION IS PRESENT.   L.  PROSTATIC ADENOCARCINOMA. GLEASON'S SCORE 7 (GRADES 4 + 3) NOTED IN 1  OUT OF 1 SUBMITTED PROSTATE CORE SEGMENTS. APPROXIMATELY 66% OF SUBMITTED  TISSUE INVOLVED.. (GRADE GROUP 3) GLEASON GRADE 4 COMPRISES 60% OF THE  TUMOR.    08/19/2023 PET scan   PSMA PET IMPRESSION: 1. No evidence of recurrence in the prostate gland. 2. Three very small lymph nodes at the level of the aortic bifurcation with intense radiotracer activity most consistent with prostate cancer nodal metastasis. 3. One small node in the LEFT perirectal fat at 5 o'clock location in relation to teh rectum is indeterminate but suspicious. 4. No more distant metastatic nodal disease. No visceral metastasis or skeletal metastasis.   09/07/2023 Initial Diagnosis   Hormone sensitive prostate cancer (HCC)    Tumor Marker   PSA 08/18/18 11.54 11/11/18 12.7 10/23/19 0.29 02/19/20 0.3 04/22/20 0.15 09/02/20 0.11 04/23/21 0.04 11/28/21 <0.1 04/24/22 0.11 11/30/22 0.4 06/16/23 0.7 08/06/23 0.9   10/12/2023 Cancer Staging   Staging form: Prostate, AJCC 8th Edition - Clinical: Stage IVB (cTX, cN1, cM1a, Grade Group: 5) - Signed by [name],  MD on 10/12/2023 Histologic grading system: 5 grade system       REVIEW OF SYSTEMS:   All relevant systems were reviewed with the patient and are negative.   VITALS:  Blood pressure 137/66, pulse 74, temperature (!) 97.3 F (36.3 C), temperature source Temporal, resp. rate 16, height 4\' 11"  (1.499 m), weight 215 lb 3.2 oz (97.6 kg), SpO2 99%.  Wt Readings from Last 3 Encounters:  12/03/23 215 lb 3.2 oz (97.6 kg)  12/01/23 210 lb (95.3 kg)  11/03/23 207 lb (93.9 kg)    Body mass index is 43.47 kg/m.  Performance status (ECOG): 0 - Asymptomatic  PHYSICAL EXAM:   GENERAL:alert, no distress and comfortable SKIN: skin color normal LUNGS: clear to auscultation with normal breathing effort.  No wheeze or rales HEART: regular rate & rhythm and no murmurs and no lower extremity edema ABDOMEN: abdomen soft, non-tender and nondistended   LABORATORY DATA:  I have reviewed the data as listed    Component Value Date/Time   NA 144 12/03/2023 1055   NA 145 (H) 09/01/2023 0853   K 3.9 12/03/2023 1055   CL 109 12/03/2023 1055   CO2 30 12/03/2023 1055   GLUCOSE 107 (H) 12/03/2023 1055   BUN 16 12/03/2023 1055   BUN 17 09/01/2023 0853   CREATININE 0.92 12/03/2023 1055   CREATININE 1.14 03/27/2022 1522   CALCIUM 8.9 12/03/2023 1055   PROT 6.6 12/03/2023 1055   PROT 6.8 09/01/2023 0853   ALBUMIN 4.1 12/03/2023 1055   ALBUMIN 4.3 09/01/2023 0853   AST 14 (L) 12/03/2023 1055   ALT 23 12/03/2023 1055   ALKPHOS 62 12/03/2023 1055   BILITOT 0.4 12/03/2023 1055   GFRNONAA >60 12/03/2023 1055   GFRAA >60 05/17/2019 0906    No results found for: "SPEP", "UPEP"  Lab Results  Component Value Date   WBC 9.1 10/08/2023   NEUTROABS 5.9 10/08/2023   HGB 14.9 10/08/2023   HCT 44.5 10/08/2023   MCV 89.2 10/08/2023   PLT 299 10/08/2023      Chemistry      Component Value Date/Time   NA 144 12/03/2023 1055   NA 145 (H) 09/01/2023 0853   K 3.9 12/03/2023 1055   CL 109 12/03/2023  1055   CO2 30 12/03/2023 1055   BUN 16 12/03/2023  1055   BUN 17 09/01/2023 0853   CREATININE 0.92 12/03/2023 1055   CREATININE 1.14 03/27/2022 1522      Component Value Date/Time   CALCIUM 8.9 12/03/2023 1055   ALKPHOS 62 12/03/2023 1055   AST 14 (L) 12/03/2023 1055   ALT 23 12/03/2023 1055   BILITOT 0.4 12/03/2023 1055       RADIOGRAPHIC STUDIES: I have personally reviewed the radiological images as listed and agreed with the findings in the report. No results found.

## 2023-12-03 NOTE — Telephone Encounter (Signed)
 Patient Scheduled appts. Patient is aware of all appt details.

## 2023-12-03 NOTE — Assessment & Plan Note (Signed)
 Continue AAP Repeat lab in 3 months.

## 2023-12-03 NOTE — Assessment & Plan Note (Addendum)
 BP stable at home with abiraterone Continue HCTZ 12.5 mg daily

## 2023-12-06 ENCOUNTER — Other Ambulatory Visit: Payer: Self-pay

## 2023-12-06 DIAGNOSIS — C61 Malignant neoplasm of prostate: Secondary | ICD-10-CM

## 2023-12-10 ENCOUNTER — Other Ambulatory Visit: Payer: PPO

## 2023-12-10 DIAGNOSIS — C61 Malignant neoplasm of prostate: Secondary | ICD-10-CM

## 2023-12-11 LAB — PSA: Prostate Specific Ag, Serum: 0.2 ng/mL (ref 0.0–4.0)

## 2023-12-14 ENCOUNTER — Ambulatory Visit: Payer: PPO | Admitting: Urology

## 2023-12-15 ENCOUNTER — Encounter: Payer: Self-pay | Admitting: Urology

## 2023-12-15 ENCOUNTER — Ambulatory Visit: Payer: PPO | Admitting: Urology

## 2023-12-15 VITALS — BP 109/69 | HR 91 | Ht 59.0 in | Wt 210.0 lb

## 2023-12-15 DIAGNOSIS — C61 Malignant neoplasm of prostate: Secondary | ICD-10-CM | POA: Diagnosis not present

## 2023-12-15 NOTE — Progress Notes (Signed)
I, Preston Thomas, acting as a Neurosurgeon for Preston Altes, MD., have documented all relevant documentation on the behalf of Preston Altes, MD, as directed by Preston Altes, MD while in the presence of Preston Altes, MD.  12/15/2023 12:26 PM   Preston Thomas 02-22-1951 [id number]  Referring provider: Doreene Nest, NP [address] [place],  [place] [address]  Chief Complaint  Patient presents with   Prostate Cancer   Urologic history: 1. cT1c prostate cancer-high risk Biopsy 10/2018, PSA 12.7, 28 g gland 10/12 cores positive Gleason 3+3 to 4+5 CT/bone scan negative for metastatic disease tx IMRT + brachytherapy + ADT; brachytherapy 05/2019 PSA noted September 2024 and PSMA PET with tracer-avid lymph nodes at aortic bifurcation Saw Dr Preston Thomas in [place] and presently on Leuprolide/abiraterone.   HPI: Preston Thomas is a 73 y.o. male presents for prostate cancer follow-up.  No complaints since last visit.  Since starting ADT+abiraterone, he has good energy and feels well.  No bothersome LUTS on tamsulosin.  Last PSA 12/10/23 was a 0.2, testosterone level 11/04/23 < 3.  PSA trend  Prostate Specific Ag, Serum  Latest Ref Rng 0.0 - 4.0 ng/mL  02/19/2020 0.3   11/28/2021 <0.1   11/30/2022 0.4   06/16/2023 0.7   08/06/2023 0.9   09/09/2023 0.9   10/08/2023 0.8   11/04/2023 0.2   12/10/2023 0.2      PMH: Past Medical History:  Diagnosis Date   Acute conjunctivitis of both eyes 02/20/2022   Bilateral lower extremity edema    Cancer (HCC)    COLONIC POLYPS, HX OF 08/31/2007   Qualifier: Diagnosis of  By: Alphonsus Sias MD, Ronnette Hila    DIVERTICULITIS, HX OF 08/31/2007   Qualifier: Diagnosis of  By: Alice Reichert CMA (AAMA), Natasha     DIVERTICULOSIS, COLON 08/31/2007   Qualifier: Diagnosis of  By: Alphonsus Sias MD, Ronnette Hila    Dyspnea    Related to Covid   Gallbladder problem    GERD (gastroesophageal reflux disease)    History of kidney stones    Hx of  radiation therapy 04/11/2019   Pneumonia due to COVID-19 virus 04/19/2019   Prostate cancer (HCC)    RENAL CALCULUS, HX OF 08/31/2007   Qualifier: Diagnosis of  By: Alice Reichert CMA (AAMA), Natasha     Shortness of breath 02/21/2020   Sleep apnea    Swallowing difficulty     Surgical History: Past Surgical History:  Procedure Laterality Date   APPENDECTOMY  1987   CHOLECYSTECTOMY  1987   RADIOACTIVE SEED IMPLANT N/A 06/27/2019   Procedure: RADIOACTIVE SEED IMPLANT/BRACHYTHERAPY IMPLANT;  Surgeon: Preston Altes, MD;  Location: [facility] ORS;  Service: Urology;  Laterality: N/A;    Home Medications:  Allergies as of 12/15/2023       Reactions   Penicillins    Passed out Did it involve swelling of the face/tongue/throat, SOB, or low BP? Yes Did it involve sudden or severe rash/hives, skin peeling, or any reaction on the inside of your mouth or nose? No Did you need to seek medical attention at a hospital or doctor's office? No When did it last happen?      30 + years If all above answers are "NO", may proceed with cephalosporin use.        Medication List        Accurate as of December 15, 2023 12:26 PM. If you have any questions, ask your nurse or  doctor.          abiraterone acetate 250 MG tablet Commonly known as: ZYTIGA Take 4 tablets (1,000 mg total) by mouth daily. Take on an empty stomach 1 hour before or 2 hours after a meal   atorvastatin 20 MG tablet Commonly known as: LIPITOR Take 1 tablet (20 mg total) by mouth daily. for cholesterol   cyanocobalamin 500 MCG tablet Commonly known as: VITAMIN B12 Take 500 mcg by mouth daily.   hydrochlorothiazide 12.5 MG capsule Commonly known as: MICROZIDE Take 1 capsule (12.5 mg total) by mouth daily.   metFORMIN 500 MG tablet Commonly known as: GLUCOPHAGE Take 1 tablet (500 mg total) by mouth 2 (two) times daily with a meal.   omeprazole 20 MG capsule Commonly known as: PRILOSEC Take 1 capsule (20 mg total) by mouth 2  (two) times daily before a meal. For heartburn.   predniSONE 5 MG tablet Commonly known as: DELTASONE Take 1 tablet (5 mg total) by mouth daily with breakfast.   SUMAtriptan 100 MG tablet Commonly known as: IMITREX TAKE 1 TABLET BY MOUTH AT MIGRAINE ONSET,MAY REPEAT IN 2 HOURS IF HEADACHE PERSISTS/RECURS *DO NOT EXCEED 100MG  IN 24 HOURS**   tadalafil 5 MG tablet Commonly known as: CIALIS Take 1 tablet (5 mg total) by mouth daily as needed for erectile dysfunction.   tamsulosin 0.4 MG Caps capsule Commonly known as: FLOMAX Take 1 capsule (0.4 mg total) by mouth daily.   vitamin C 250 MG tablet Commonly known as: ASCORBIC ACID Take 250 mg by mouth daily.   Vitamin D (Ergocalciferol) 1.25 MG (50000 UNIT) Caps capsule Commonly known as: DRISDOL Take 1 capsule (50,000 Units total) by mouth every other week.   Zinc 100 MG Tabs Take by mouth.        Allergies:  Allergies  Allergen Reactions   Penicillins     Passed out Did it involve swelling of the face/tongue/throat, SOB, or low BP? Yes Did it involve sudden or severe rash/hives, skin peeling, or any reaction on the inside of your mouth or nose? No Did you need to seek medical attention at a hospital or doctor's office? No When did it last happen?      30 + years If all above answers are "NO", may proceed with cephalosporin use.     Family History: Family History  Problem Relation Age of Onset   Early death Father    Kidney disease Father    Heart failure Sister     Social History:  reports that he quit smoking about 39 years ago. His smoking use included cigarettes. He started smoking about 49 years ago. He has a 15 pack-year smoking history. He has never used smokeless tobacco. He reports that he does not currently use alcohol. He reports that he does not use drugs.   Physical Exam: BP 109/69   Pulse 91   Ht 4\' 11"  (1.499 m)   Wt 210 lb (95.3 kg)   BMI 42.41 kg/m   Constitutional:  Alert and oriented, No  acute distress. HEENT: [place] AT Respiratory: Normal respiratory effort, no increased work of breathing. Psychiatric: Normal mood and affect.   Assessment & Plan:    1. M1 hormone sensitive prostate cancer. On Leuprolide+abiraterone.  Sees medical oncology regularly. 1 year follow-up.  I have reviewed the above documentation for accuracy and completeness, and I agree with the above.   Preston Altes, MD  [facility] [address], [address] [place], [place]  [address] [phone]  

## 2023-12-21 ENCOUNTER — Other Ambulatory Visit: Payer: Self-pay

## 2023-12-21 NOTE — Progress Notes (Signed)
 Specialty Pharmacy Refill Coordination Note  Preston Thomas is a 73 y.o. male contacted today regarding refills of specialty medication(s) Abiraterone Acetate (ZYTIGA)   Patient requested (Patient-Rptd) Delivery   Delivery date: (Patient-Rptd) 12/27/23   Verified address: (Patient-Rptd) [address] [place], [place] [address]   Medication will be filled on 03.28.25.

## 2023-12-22 NOTE — Assessment & Plan Note (Signed)
 Continue AAP Will obtain PET and refer for MDT if able.

## 2023-12-22 NOTE — Progress Notes (Unsigned)
  Cancer Center OFFICE PROGRESS NOTE  Patient Care Team: Doreene Nest, NP as PCP - General (Internal Medicine) Debbe Odea, MD as PCP - Cardiology (Cardiology) Cherlyn Cushing, RN as Oncology Nurse Navigator Antony Contras, MD as Consulting Physician (Ophthalmology)  Preston Thomas is a 73 y.o.male with history of HTN, HLD, DM, OSA being seen at [facility] for prostate cancer.    Current diagnosis: M1 HSPC Initial diagnosis: cT1cN0M0 PSA 12.7, GG5 (GRADES 4 + 5)  Germline testing: not yet. Referral placed       Somatic testing: not yet Treatment: initial treatment: 05/2019 IMRT + brachytherapy + ADT. Received final leuprolide injection 02/10/2021  Recurrence 08/2023 PET: Three very small lymph nodes at the level of the aortic bifurcation with intense radiotracer activity most consistent with prostate cancer nodal metastasis. One small node in the LEFT perirectal fat at 5 o'clock location in relation to the rectum is indeterminate but suspicious.  09/15/23 started AAP. Pre-AAP PSA 0.9.    Tolerating well. PSA at 0.2   Last ADT with Lupron on 10/08/2023 Assessment & Plan Hormone sensitive prostate cancer (HCC) Continue AAP Next ADT 4/10 Will obtain PET and refer for MDT if able. Referral placed to genetics Secondary hypertension BP stable at home with abiraterone mostly 130's SBP Continue HCTZ 12.5 mg daily Other hyperlipidemia On Lipitor. No SE from adding AAP  Labs and PET in about 4 weeks. See me in about 6 weeks.  Orders Placed This Encounter  Procedures   NM PET (PSMA) SKULL TO MID THIGH    Standing Status:   Future    Expected Date:   01/24/2024    Expiration Date:   12/22/2024    If indicated for the ordered procedure, I authorize the administration of a radiopharmaceutical per Radiology protocol:   Yes    Preferred imaging location?:   [facility]   CMP (Cancer Center only)    Standing Status:   Future    Expiration Date:   12/22/2024   CBC with  Differential (Cancer Center Only)    Standing Status:   Future    Expiration Date:   12/22/2024   Prostate-Specific AG, Serum    Standing Status:   Future    Expiration Date:   12/22/2024   Testosterone    Standing Status:   Future    Expiration Date:   12/22/2024   Ambulatory referral to Genetics    Referral Priority:   Routine    Referral Type:   Consultation    Referral Reason:   Specialty Services Required    Number of Visits Requested:   1     [name], MD  INTERVAL HISTORY: he returns for treatment follow-up. BP 130's at home. No chest pain, headache. Some tolerable hot flashes. Continues to exercise, lifting weight. Morning fatigue not preventing him from activities. Urinating fine without issue.   Oncology History  Hormone sensitive prostate cancer (HCC)  11/25/2018 Pathology Results   Prostate biopsy diagnosis:  A. BENIGN PROSTATE TISSUE. NO EVIDENCE OF MALIGNANCY.   B.  PROSTATIC ADENOCARCINOMA. GLEASON'S SCORE 6 (GRADES 3 + 3) NOTED IN 1  OUT OF 1 SUBMITTED PROSTATE CORE SEGMENTS. APPROXIMATELY 60% OF SUBMITTED  TISSUE INVOLVED.. (GRADE GROUP 1) PERINEURAL INVASION IS PRESENT.   C.  PROSTATIC ADENOCARCINOMA. GLEASON'S SCORE 6 (GRADES 3 + 3) NOTED IN 1  OUT OF 1 SUBMITTED PROSTATE CORE SEGMENTS. APPROXIMATELY 45% OF SUBMITTED  TISSUE INVOLVED.. (GRADE GROUP 1)   D.  BENIGN CONNECTIVE  TISSUE AND SMOOTH MUSCLE. NO PROSTATE TISSUE SEEN.   E.  PROSTATIC ADENOCARCINOMA. GLEASON'S SCORE 8 (GRADES 3 + 5) NOTED IN 1  OUT OF 1 SUBMITTED PROSTATE CORE SEGMENTS. APPROXIMATELY 91% OF SUBMITTED  TISSUE INVOLVED. (GRADE GROUP 4) PERINEURAL INVASION IS PRESENT.   F.  PROSTATIC ADENOCARCINOMA. GLEASON'S SCORE 7 (GRADES 3 + 4) NOTED IN 1  OUT OF 1 SUBMITTED PROSTATE CORE SEGMENTS. APPROXIMATELY 85% OF SUBMITTED  TISSUE INVOLVED.Preston Thomas GLEASON GRADE 4 COMPRISES 30% OF THE TUMOR. (GRADE GROUP  2) PERINEURAL INVASION IS PRESENT.   G.  PROSTATIC ADENOCARCINOMA. GLEASON'S SCORE 6  (GRADES 3 + 3) NOTED IN 1  OUT OF 2 SUBMITTED PROSTATE CORE SEGMENTS. APPROXIMATELY 9% OF SUBMITTED  TISSUE INVOLVED.. (GRADE GROUP 1)   H.  PROSTATIC ADENOCARCINOMA. GLEASON'S SCORE 6 (GRADES 3 + 3) NOTED IN 1  OUT OF 1 SUBMITTED PROSTATE CORE SEGMENTS. APPROXIMATELY 13% OF SUBMITTED  TISSUE INVOLVED.. (GRADE GROUP 1) PERINEURAL INVASION IS PRESENT.   I.  PROSTATIC ADENOCARCINOMA. GLEASON'S SCORE 7 (GRADES 4 + 3) NOTED IN 2  OUT OF 2 SUBMITTED PROSTATE CORE SEGMENTS. APPROXIMATELY 27% OF SUBMITTED  TISSUE INVOLVED.. (GRADE GROUP 3) GLEASON GRADE 4 COMPRISES 70% OF THE  TUMOR.   J. PROSTATIC ADENOCARCINOMA. GLEASON'S SCORE 9 (GRADES 4 + 5) NOTED IN 1  OUT OF 2 SUBMITTED PROSTATE CORE SEGMENTS. APPROXIMATELY 73% OF SUBMITTED  TISSUE INVOLVED.. (GRADE GROUP 5) PERINEURAL INVASION IS PRESENT.   K.  PROSTATIC ADENOCARCINOMA. GLEASON'S SCORE 8 (GRADES 3 + 5) NOTED IN 1  OUT OF 1 SUBMITTED PROSTATE CORE SEGMENTS. APPROXIMATELY 100% OF SUBMITTED  TISSUE INVOLVED.. (GRADE GROUP 4) PERINEURAL INVASION IS PRESENT.   L.  PROSTATIC ADENOCARCINOMA. GLEASON'S SCORE 7 (GRADES 4 + 3) NOTED IN 1  OUT OF 1 SUBMITTED PROSTATE CORE SEGMENTS. APPROXIMATELY 66% OF SUBMITTED  TISSUE INVOLVED.. (GRADE GROUP 3) GLEASON GRADE 4 COMPRISES 60% OF THE  TUMOR.    08/19/2023 PET scan   PSMA PET IMPRESSION: 1. No evidence of recurrence in the prostate gland. 2. Three very small lymph nodes at the level of the aortic bifurcation with intense radiotracer activity most consistent with prostate cancer nodal metastasis. 3. One small node in the LEFT perirectal fat at 5 o'clock location in relation to teh rectum is indeterminate but suspicious. 4. No more distant metastatic nodal disease. No visceral metastasis or skeletal metastasis.   09/07/2023 Initial Diagnosis   Hormone sensitive prostate cancer (HCC)    Tumor Marker   PSA 08/18/18 11.54 11/11/18 12.7 10/23/19 0.29 02/19/20 0.3 04/22/20 0.15 09/02/20  0.11 04/23/21 0.04 11/28/21 <0.1 04/24/22 0.11 11/30/22 0.4 06/16/23 0.7 08/06/23 0.9   10/12/2023 Cancer Staging   Staging form: Prostate, AJCC 8th Edition - Clinical: Stage IVB (cTX, cN1, cM1a, Grade Group: 5) - Signed by [name], MD on 10/12/2023 Histologic grading system: 5 grade system      PHYSICAL EXAMINATION: ECOG PERFORMANCE STATUS: 0 - Asymptomatic  Vitals:   12/23/23 0900  BP: 124/66  Pulse: 84  Resp: 16  Temp: 97.6 F (36.4 C)  SpO2: 97%   Filed Weights   12/23/23 0900  Weight: 214 lb 12.8 oz (97.4 kg)    Relevant data reviewed during this visit included labs.

## 2023-12-23 ENCOUNTER — Inpatient Hospital Stay

## 2023-12-23 ENCOUNTER — Encounter: Payer: Self-pay | Admitting: Urology

## 2023-12-23 ENCOUNTER — Other Ambulatory Visit: Payer: Self-pay

## 2023-12-23 ENCOUNTER — Other Ambulatory Visit: Payer: Self-pay | Admitting: *Deleted

## 2023-12-23 ENCOUNTER — Other Ambulatory Visit (HOSPITAL_COMMUNITY): Payer: Self-pay

## 2023-12-23 VITALS — BP 124/66 | HR 84 | Temp 97.6°F | Resp 16 | Ht 59.0 in | Wt 214.8 lb

## 2023-12-23 DIAGNOSIS — Z191 Hormone sensitive malignancy status: Secondary | ICD-10-CM

## 2023-12-23 DIAGNOSIS — E7849 Other hyperlipidemia: Secondary | ICD-10-CM | POA: Diagnosis not present

## 2023-12-23 DIAGNOSIS — I159 Secondary hypertension, unspecified: Secondary | ICD-10-CM | POA: Diagnosis not present

## 2023-12-23 DIAGNOSIS — C61 Malignant neoplasm of prostate: Secondary | ICD-10-CM | POA: Diagnosis not present

## 2023-12-23 LAB — CBC WITH DIFFERENTIAL (CANCER CENTER ONLY)
Abs Immature Granulocytes: 0.08 10*3/uL — ABNORMAL HIGH (ref 0.00–0.07)
Basophils Absolute: 0.1 10*3/uL (ref 0.0–0.1)
Basophils Relative: 1 %
Eosinophils Absolute: 0.4 10*3/uL (ref 0.0–0.5)
Eosinophils Relative: 4 %
HCT: 43.7 % (ref 39.0–52.0)
Hemoglobin: 14.7 g/dL (ref 13.0–17.0)
Immature Granulocytes: 1 %
Lymphocytes Relative: 25 %
Lymphs Abs: 2.4 10*3/uL (ref 0.7–4.0)
MCH: 30.5 pg (ref 26.0–34.0)
MCHC: 33.6 g/dL (ref 30.0–36.0)
MCV: 90.7 fL (ref 80.0–100.0)
Monocytes Absolute: 1.1 10*3/uL — ABNORMAL HIGH (ref 0.1–1.0)
Monocytes Relative: 11 %
Neutro Abs: 5.6 10*3/uL (ref 1.7–7.7)
Neutrophils Relative %: 58 %
Platelet Count: 308 10*3/uL (ref 150–400)
RBC: 4.82 MIL/uL (ref 4.22–5.81)
RDW: 13.1 % (ref 11.5–15.5)
WBC Count: 9.7 10*3/uL (ref 4.0–10.5)
nRBC: 0 % (ref 0.0–0.2)

## 2023-12-23 LAB — CMP (CANCER CENTER ONLY)
ALT: 24 U/L (ref 0–44)
AST: 14 U/L — ABNORMAL LOW (ref 15–41)
Albumin: 4.2 g/dL (ref 3.5–5.0)
Alkaline Phosphatase: 57 U/L (ref 38–126)
Anion gap: 8 (ref 5–15)
BUN: 18 mg/dL (ref 8–23)
CO2: 32 mmol/L (ref 22–32)
Calcium: 9.6 mg/dL (ref 8.9–10.3)
Chloride: 105 mmol/L (ref 98–111)
Creatinine: 0.96 mg/dL (ref 0.61–1.24)
GFR, Estimated: >60 mL/min (ref >60)
Glucose, Bld: 103 mg/dL — ABNORMAL HIGH (ref 70–99)
Potassium: 3.7 mmol/L (ref 3.5–5.1)
Sodium: 145 mmol/L (ref 135–145)
Total Bilirubin: 0.5 mg/dL (ref 0.0–1.2)
Total Protein: 6.9 g/dL (ref 6.5–8.1)

## 2023-12-23 MED ORDER — TAMSULOSIN HCL 0.4 MG PO CAPS
0.4000 mg | ORAL_CAPSULE | Freq: Every day | ORAL | 2 refills | Status: AC
  Filled 2023-12-23 (×2): qty 90, 90d supply, fill #0
  Filled 2024-03-24: qty 90, 90d supply, fill #1
  Filled 2024-08-25: qty 90, 90d supply, fill #2

## 2023-12-23 NOTE — Assessment & Plan Note (Addendum)
 On Lipitor. No SE from adding AAP

## 2023-12-23 NOTE — Assessment & Plan Note (Addendum)
 BP stable at home with abiraterone mostly 130's SBP Continue HCTZ 12.5 mg daily

## 2023-12-24 ENCOUNTER — Other Ambulatory Visit: Payer: Self-pay

## 2023-12-24 LAB — PROSTATE-SPECIFIC AG, SERUM (LABCORP): Prostate Specific Ag, Serum: 0.1 ng/mL (ref 0.0–4.0)

## 2023-12-24 LAB — TESTOSTERONE: Testosterone: <3 ng/dL — ABNORMAL LOW (ref 264–916)

## 2023-12-30 ENCOUNTER — Other Ambulatory Visit: Payer: Self-pay | Admitting: Primary Care

## 2023-12-30 ENCOUNTER — Other Ambulatory Visit: Payer: Self-pay

## 2023-12-30 ENCOUNTER — Other Ambulatory Visit (HOSPITAL_COMMUNITY): Payer: Self-pay

## 2023-12-30 ENCOUNTER — Ambulatory Visit (INDEPENDENT_AMBULATORY_CARE_PROVIDER_SITE_OTHER): Admitting: Physician Assistant

## 2023-12-30 ENCOUNTER — Encounter (INDEPENDENT_AMBULATORY_CARE_PROVIDER_SITE_OTHER): Payer: Self-pay | Admitting: Physician Assistant

## 2023-12-30 VITALS — BP 111/68 | HR 82 | Temp 98.4°F | Ht 59.0 in | Wt 211.0 lb

## 2023-12-30 DIAGNOSIS — Z191 Hormone sensitive malignancy status: Secondary | ICD-10-CM | POA: Diagnosis not present

## 2023-12-30 DIAGNOSIS — R600 Localized edema: Secondary | ICD-10-CM

## 2023-12-30 DIAGNOSIS — E785 Hyperlipidemia, unspecified: Secondary | ICD-10-CM

## 2023-12-30 DIAGNOSIS — E538 Deficiency of other specified B group vitamins: Secondary | ICD-10-CM | POA: Diagnosis not present

## 2023-12-30 DIAGNOSIS — R7303 Prediabetes: Secondary | ICD-10-CM | POA: Diagnosis not present

## 2023-12-30 DIAGNOSIS — E669 Obesity, unspecified: Secondary | ICD-10-CM

## 2023-12-30 DIAGNOSIS — E559 Vitamin D deficiency, unspecified: Secondary | ICD-10-CM | POA: Diagnosis not present

## 2023-12-30 DIAGNOSIS — Z6841 Body Mass Index (BMI) 40.0 and over, adult: Secondary | ICD-10-CM

## 2023-12-30 DIAGNOSIS — R7989 Other specified abnormal findings of blood chemistry: Secondary | ICD-10-CM

## 2023-12-30 DIAGNOSIS — C61 Malignant neoplasm of prostate: Secondary | ICD-10-CM | POA: Diagnosis not present

## 2023-12-30 MED ORDER — METFORMIN HCL 500 MG PO TABS
500.0000 mg | ORAL_TABLET | Freq: Two times a day (BID) | ORAL | 0 refills | Status: DC
  Filled 2023-12-30: qty 180, 90d supply, fill #0

## 2023-12-30 MED ORDER — VITAMIN D (ERGOCALCIFEROL) 1.25 MG (50000 UNIT) PO CAPS
50000.0000 [IU] | ORAL_CAPSULE | ORAL | 0 refills | Status: DC
  Filled 2023-12-30: qty 5, fill #0

## 2023-12-30 MED ORDER — HYDROCHLOROTHIAZIDE 12.5 MG PO CAPS
12.5000 mg | ORAL_CAPSULE | Freq: Every day | ORAL | 1 refills | Status: DC
  Filled 2023-12-30: qty 30, 30d supply, fill #0

## 2023-12-30 MED ORDER — ATORVASTATIN CALCIUM 20 MG PO TABS
20.0000 mg | ORAL_TABLET | Freq: Every day | ORAL | 0 refills | Status: DC
Start: 2023-12-30 — End: 2024-04-02
  Filled 2023-12-30: qty 90, 90d supply, fill #0

## 2023-12-30 NOTE — Progress Notes (Signed)
 SUBJECTIVE: Discussed the use of AI scribe software for clinical note transcription with the patient, who gave verbal consent to proceed.  Chief Complaint: Obesity  Interim History: He is up 1 lbs since last visit.  Down 18 lbs overall]  Muscle mass - 1 lb Adipose mass + 2.4 lbs Total body water + 1.2 lbs  Preston Thomas is here to discuss his progress with his obesity treatment plan. He is on the following a lower carbohydrate, vegetable and lean protein rich diet plan and states he is following his eating plan approximately 98 % of the time. He states he is exercising weight lifting, walking for 45 minutes 5 times per week.  Preston Levans III "Billey Gosling" is a 73 year old male who presents for follow-up of his obesity treatment plan.  He is currently undergoing treatment for hormone-sensitive prostate cancer and is experiencing side effects from his medications, including fatigue and hot flashes. His PSA levels have decreased from 0.9 to 0.1.  He has gained approximately six pounds since his weight started increasing again, currently weighing 211 pounds, up from 205 pounds. He attributes some of the weight gain to water retention and is working hard to manage his weight despite the challenges posed by his medications. He is focused on maintaining muscle mass and consuming a high-protein diet while avoiding carbohydrates.  He is on hydrochlorothiazide for swelling, which is not significantly helping with the puffiness, particularly in his arms.  He is on hormonal therapy and prednisone for prostate cancer management. He is also taking metformin 500 mg twice daily for prediabetic management, ergocalciferol 50,000 units once weekly for vitamin D deficiency, and vitamin B12, 500 mcg daily. He reports no stomach issues with metformin. OBJECTIVE: Visit Diagnoses: Problem List Items Addressed This Visit     Prediabetes - Primary   Relevant Medications   metFORMIN (GLUCOPHAGE) 500 MG tablet    Other Relevant Orders   Hemoglobin A1c   Vitamin D deficiency   Relevant Medications   Vitamin D, Ergocalciferol, (DRISDOL) 1.25 MG (50000 UNIT) CAPS capsule   Low vitamin B12 level   Obesity (HCC)- start BMI 44.92   Relevant Medications   metFORMIN (GLUCOPHAGE) 500 MG tablet   BMI 40.0-44.9, adult (HCC)  Current BMI 43.1   Relevant Medications   metFORMIN (GLUCOPHAGE) 500 MG tablet   Lower leg edema   Relevant Medications   hydrochlorothiazide (MICROZIDE) 12.5 MG capsule   Hormone sensitive prostate cancer (HCC)   Obesity He is working on [employer] and has gained only one pound with his chemotherapy/hormone therapy for prostate cancer. He is currently at 211 pounds, up from 205 pounds, but still down 18 pounds overall. He is focused on maintaining his weight and is aware of the pharmaceutical impact on his weight. He is encouraged by his progress and is committed to controlling his diet and exercise. - Continue current weight management plan - Encourage high protein intake and avoidance of carbohydrates - Schedule follow-up appointment in six weeks   Prostate Cancer He is undergoing treatment for hormone-sensitive prostate cancer with hormonal therapy and prednisone. His PSA levels have decreased from 0.9 to 0.1, indicating a positive response to treatment. A PET scan is scheduled for April 28 to locate cancer cells for potential high-focused radiation therapy, which could lead to remission and discontinuation of current medications. He is hopeful about the possibility of remission and discontinuing medication. Will follow with oncology.   Prediabetes He is on metformin 500 mg twice daily for prediabetic  management. He reports no gastrointestinal side effects from metformin. The possibility of increasing metformin to three times daily was discussed but deferred until after the PET scan results. He is on daily prednisone, which may affect glucose control. - Continue metformin  500 mg twice daily - Order A1c test to monitor glucose control - Evaluate potential increase in metformin dosage after PET scan results Continue working on nutrition plan to decrease simple carbohydrates, increase lean proteins and exercise to promote weight loss, improve glycemic control and prevent progression to Type 2 diabetes.  Meds ordered this encounter  Medications   Vitamin D, Ergocalciferol, (DRISDOL) 1.25 MG (50000 UNIT) CAPS capsule    Sig: Take 1 capsule (50,000 Units total) by mouth every other week.    Dispense:  5 capsule    Refill:  0   metFORMIN (GLUCOPHAGE) 500 MG tablet    Sig: Take 1 tablet (500 mg total) by mouth 2 (two) times daily with a meal.    Dispense:  180 tablet    Refill:  0   hydrochlorothiazide (MICROZIDE) 12.5 MG capsule    Sig: Take 1 capsule (12.5 mg total) by mouth daily.    Dispense:  30 capsule    Refill:  1    Leg Edema He experiences some swelling, which is managed with hydrochlorothiazide. The swelling is noted to be different, and he appears puffier than usual. He acknowledges the swelling as a side effect of his current treatment regimen. - Continue hydrochlorothiazide 12.5 mg daily for edema management He will have follow up labs at the cancer center over the next week and will monitor renal/potassium with these labs.   Vitamin D Deficiency He is on ergocalciferol 50,000 units once weekly for vitamin D deficiency. No N/V or muscle weakness with Ergocalciferol.  Last vitamin D Lab Results  Component Value Date   VD25OH 69.1 09/01/2023  Low vitamin D levels can be associated with adiposity and may result in leptin resistance and weight gain. Also associated with fatigue.  Currently on vitamin D supplementation without any adverse effects such as nausea, vomiting or muscle weakness.  - Refill ergocalciferol 50,000 units once weekly  Vitamin B12 Deficiency He is on vitamin B12, 500 mcg daily. No side effects.  Lab Results  Component Value  Date   VITAMINB12 463 09/01/2023   - Continue vitamin B12, 500 mcg daily  Follow-up He will have a follow-up appointment after the PET scan results are available. The appointment is scheduled for May 14 at 9 AM. Labs, including an A1c test, will be reviewed at that time. - Schedule follow-up appointment for May 14 at 9 AM - Review PET scan results and lab work at follow-up appointment Vitals Temp: 98.4 F (36.9 C) BP: 111/68 Pulse Rate: 82 SpO2: 97 %   Anthropometric Measurements Height: 4\' 11"  (1.499 m) Weight: 211 lb (95.7 kg) BMI (Calculated): 42.59 Weight at Last Visit: 210 lb Weight Lost Since Last Visit: 0 Weight Gained Since Last Visit: 1 lb Starting Weight: 229 lb Total Weight Loss (lbs): 18 lb (8.165 kg) Peak Weight: 238 lb   Body Composition  Body Fat %: 40.8 % Fat Mass (lbs): 86.2 lbs Muscle Mass (lbs): 118.8 lbs Total Body Water (lbs): 98.6 lbs Visceral Fat Rating : 29   Other Clinical Data Fasting: no Labs: no Today's Visit #: 24 Starting Date: 06/10/22     ASSESSMENT AND PLAN:  Diet: Alekai is currently in the action stage of change. As such, his goal is to  continue with weight loss efforts. He has agreed to following a lower carbohydrate, vegetable and lean protein rich diet plan.  Exercise: Vittorio has been instructed to continue exercising as is for weight loss and overall health benefits.   Behavior Modification:  We discussed the following Behavioral Modification Strategies today: increasing lean protein intake, decreasing simple carbohydrates, increasing vegetables, increase H2O intake, increase high fiber foods, avoiding temptations, and planning for success. We discussed various medication options to help Leonette Most with his weight loss efforts and we both agreed to continue current medical plan and continue to work on nutritional and behavioral strategies to promote weight loss.  .  Return in about 6 weeks (around 02/10/2024).[name] He was  informed of the importance of frequent follow up visits to maximize his success with intensive lifestyle modifications for his multiple health conditions.  Attestation Statements:   Reviewed by clinician on day of visit: allergies, medications, problem list, medical history, surgical history, family history, social history, and previous encounter notes.   Time spent on visit including pre-visit chart review and post-visit care and charting was 26 minutes.    Chyenne Sobczak, PA-C

## 2023-12-31 ENCOUNTER — Other Ambulatory Visit: Payer: Self-pay

## 2024-01-01 ENCOUNTER — Other Ambulatory Visit: Payer: Self-pay | Admitting: Primary Care

## 2024-01-01 DIAGNOSIS — K219 Gastro-esophageal reflux disease without esophagitis: Secondary | ICD-10-CM

## 2024-01-04 DIAGNOSIS — K219 Gastro-esophageal reflux disease without esophagitis: Secondary | ICD-10-CM

## 2024-01-04 NOTE — Telephone Encounter (Signed)
 That is the pharmacy we have on file. Do we need to select something different in order to get it mail order?

## 2024-01-06 ENCOUNTER — Inpatient Hospital Stay

## 2024-01-06 VITALS — BP 142/79 | HR 84 | Temp 98.9°F

## 2024-01-06 DIAGNOSIS — Z5111 Encounter for antineoplastic chemotherapy: Secondary | ICD-10-CM | POA: Diagnosis not present

## 2024-01-06 DIAGNOSIS — C61 Malignant neoplasm of prostate: Secondary | ICD-10-CM | POA: Insufficient documentation

## 2024-01-06 MED ORDER — LEUPROLIDE ACETATE (3 MONTH) 22.5 MG ~~LOC~~ KIT
22.5000 mg | PACK | Freq: Once | SUBCUTANEOUS | Status: AC
  Administered 2024-01-06: 22.5 mg via SUBCUTANEOUS
  Filled 2024-01-06: qty 22.5

## 2024-01-06 NOTE — Patient Instructions (Signed)
 Leuprolide Emulsion for Injection What is this medication? LEUPROLIDE (loo PROE lide) reduces the symptoms of prostate cancer. It works by decreasing levels of the hormone testosterone in the body. This prevents prostate cancer cells from spreading or growing. This medicine may be used for other purposes; ask your health care provider or pharmacist if you have questions. COMMON BRAND NAME(S): CAMCEVI What should I tell my care team before I take this medication? They need to know if you have any of these conditions: Diabetes Heart disease Heart failure High or low levels of electrolytes, such as magnesium, potassium, or sodium in your blood Irregular heartbeat or rhythm Seizures An unusual or allergic reaction to leuprolide, other medications, foods, dyes, or preservatives Pregnant or trying to get pregnant Breastfeeding How should I use this medication? This medication is injected under the skin. It is given by your care team in a hospital or clinic setting. Talk to your care team about the use of this medication in children. Special care may be needed. Overdosage: If you think you have taken too much of this medicine contact a poison control center or emergency room at once. NOTE: This medicine is only for you. Do not share this medicine with others. What if I miss a dose? Keep appointments for follow-up doses. It is important not to miss your dose. Call your care team if you are unable to keep an appointment. What may interact with this medication? Do not take this medication with any of the following: Cisapride Dronedarone Ketoconazole Levoketoconazole Pimozide Thioridazine This medication may also interact with the following: Other medications that cause heart rhythm changes This list may not describe all possible interactions. Give your health care provider a list of all the medicines, herbs, non-prescription drugs, or dietary supplements you use. Also tell them if you smoke,  drink alcohol, or use illegal drugs. Some items may interact with your medicine. What should I watch for while using this medication? Visit your care team for regular checks on your progress. Tell your care team if your symptoms do not start to get better or if they get worse. This medication may increase blood sugar. The risk may be higher in patients who already have diabetes. Ask your care team what you can do to lower the risk of diabetes while taking this medication. This medication may cause infertility. Talk to your care team if you are concerned about your fertility. Heart attacks and strokes have been reported with the use of this medication. Get emergency help if you develop signs or symptoms of a heart attack or stroke. Talk to your care team about the risks and benefits of this medication. What side effects may I notice from receiving this medication? Side effects that you should report to your care team as soon as possible: Allergic reactions--skin rash, itching, hives, swelling of the face, lips, tongue, or throat Heart attack--pain or tightness in the chest, shoulders, arms, or jaw, nausea, shortness of breath, cold or clammy skin, feeling faint or lightheaded Heart rhythm changes--fast or irregular heartbeat, dizziness, feeling faint or lightheaded, chest pain, trouble breathing High blood sugar (hyperglycemia)--increased thirst or amount of urine, unusual weakness or fatigue, blurry vision New or worsening seizures Redness, blistering, peeling, or loosening of the skin, including inside the mouth Stroke--sudden numbness or weakness of the face, arm, or leg, trouble speaking, confusion, trouble walking, loss of balance or coordination, dizziness, severe headache, change in vision Swelling and pain of the tumor site or lymph nodes Side effects that  usually do not require medical attention (report these to your care team if they continue or are bothersome): Change in sex drive or  performance Hot flashes Joint pain Pain, redness, or irritation at injection site Swelling of the ankles, hands, or feet Unusual weakness or fatigue This list may not describe all possible side effects. Call your doctor for medical advice about side effects. You may report side effects to FDA at 1-800-FDA-1088. Where should I keep my medication? This medication is given in a hospital or clinic. It will not be stored at home. NOTE: This sheet is a summary. It may not cover all possible information. If you have questions about this medicine, talk to your doctor, pharmacist, or health care provider.  2024 Elsevier/Gold Standard (2023-08-27 00:00:00)

## 2024-01-11 ENCOUNTER — Other Ambulatory Visit (HOSPITAL_COMMUNITY): Payer: Self-pay

## 2024-01-11 MED ORDER — OMEPRAZOLE 20 MG PO CPDR
20.0000 mg | DELAYED_RELEASE_CAPSULE | Freq: Two times a day (BID) | ORAL | 0 refills | Status: DC
  Filled 2024-01-11 – 2024-03-24 (×2): qty 180, 90d supply, fill #0

## 2024-01-11 NOTE — Addendum Note (Signed)
 Addended by: Nai Dasch K on: 01/11/2024 04:53 PM   Modules accepted: Orders

## 2024-01-21 ENCOUNTER — Ambulatory Visit (INDEPENDENT_AMBULATORY_CARE_PROVIDER_SITE_OTHER): Payer: PPO | Admitting: Primary Care

## 2024-01-21 ENCOUNTER — Encounter: Payer: Self-pay | Admitting: Primary Care

## 2024-01-21 ENCOUNTER — Inpatient Hospital Stay

## 2024-01-21 VITALS — BP 126/72 | HR 78 | Temp 97.6°F | Ht 60.0 in | Wt 218.0 lb

## 2024-01-21 DIAGNOSIS — G4733 Obstructive sleep apnea (adult) (pediatric): Secondary | ICD-10-CM | POA: Diagnosis not present

## 2024-01-21 DIAGNOSIS — E785 Hyperlipidemia, unspecified: Secondary | ICD-10-CM | POA: Diagnosis not present

## 2024-01-21 DIAGNOSIS — K219 Gastro-esophageal reflux disease without esophagitis: Secondary | ICD-10-CM | POA: Diagnosis not present

## 2024-01-21 DIAGNOSIS — I10 Essential (primary) hypertension: Secondary | ICD-10-CM

## 2024-01-21 DIAGNOSIS — L409 Psoriasis, unspecified: Secondary | ICD-10-CM

## 2024-01-21 DIAGNOSIS — C61 Malignant neoplasm of prostate: Secondary | ICD-10-CM

## 2024-01-21 DIAGNOSIS — R7303 Prediabetes: Secondary | ICD-10-CM

## 2024-01-21 DIAGNOSIS — Z0000 Encounter for general adult medical examination without abnormal findings: Secondary | ICD-10-CM

## 2024-01-21 DIAGNOSIS — Z191 Hormone sensitive malignancy status: Secondary | ICD-10-CM

## 2024-01-21 DIAGNOSIS — G43009 Migraine without aura, not intractable, without status migrainosus: Secondary | ICD-10-CM

## 2024-01-21 DIAGNOSIS — H6120 Impacted cerumen, unspecified ear: Secondary | ICD-10-CM | POA: Insufficient documentation

## 2024-01-21 DIAGNOSIS — Z5111 Encounter for antineoplastic chemotherapy: Secondary | ICD-10-CM | POA: Diagnosis not present

## 2024-01-21 DIAGNOSIS — H6123 Impacted cerumen, bilateral: Secondary | ICD-10-CM | POA: Diagnosis not present

## 2024-01-21 LAB — CBC WITH DIFFERENTIAL (CANCER CENTER ONLY)
Abs Immature Granulocytes: 0.1 10*3/uL — ABNORMAL HIGH (ref 0.00–0.07)
Basophils Absolute: 0.1 10*3/uL (ref 0.0–0.1)
Basophils Relative: 1 %
Eosinophils Absolute: 0.4 10*3/uL (ref 0.0–0.5)
Eosinophils Relative: 4 %
HCT: 39.1 % (ref 39.0–52.0)
Hemoglobin: 13.5 g/dL (ref 13.0–17.0)
Immature Granulocytes: 1 %
Lymphocytes Relative: 20 %
Lymphs Abs: 1.8 10*3/uL (ref 0.7–4.0)
MCH: 30.8 pg (ref 26.0–34.0)
MCHC: 34.5 g/dL (ref 30.0–36.0)
MCV: 89.1 fL (ref 80.0–100.0)
Monocytes Absolute: 0.7 10*3/uL (ref 0.1–1.0)
Monocytes Relative: 8 %
Neutro Abs: 6 10*3/uL (ref 1.7–7.7)
Neutrophils Relative %: 66 %
Platelet Count: 293 10*3/uL (ref 150–400)
RBC: 4.39 MIL/uL (ref 4.22–5.81)
RDW: 13.3 % (ref 11.5–15.5)
WBC Count: 9.1 10*3/uL (ref 4.0–10.5)
nRBC: 0 % (ref 0.0–0.2)

## 2024-01-21 LAB — CMP (CANCER CENTER ONLY)
ALT: 25 U/L (ref 0–44)
AST: 17 U/L (ref 15–41)
Albumin: 4.2 g/dL (ref 3.5–5.0)
Alkaline Phosphatase: 50 U/L (ref 38–126)
Anion gap: 6 (ref 5–15)
BUN: 20 mg/dL (ref 8–23)
CO2: 30 mmol/L (ref 22–32)
Calcium: 9.1 mg/dL (ref 8.9–10.3)
Chloride: 109 mmol/L (ref 98–111)
Creatinine: 0.98 mg/dL (ref 0.61–1.24)
GFR, Estimated: >60 mL/min (ref >60)
Glucose, Bld: 108 mg/dL — ABNORMAL HIGH (ref 70–99)
Potassium: 3.3 mmol/L — ABNORMAL LOW (ref 3.5–5.1)
Sodium: 145 mmol/L (ref 135–145)
Total Bilirubin: 0.4 mg/dL (ref 0.0–1.2)
Total Protein: 6.5 g/dL (ref 6.5–8.1)

## 2024-01-21 NOTE — Assessment & Plan Note (Signed)
 Immunizations UTD.  Colonoscopy UTD, due 2028 PSA UTD  Discussed the importance of a healthy diet and regular exercise in order for weight loss, and to reduce the risk of further co-morbidity.  Exam stable. Labs reviewed  Follow up in 1 year for repeat physical.

## 2024-01-21 NOTE — Assessment & Plan Note (Signed)
Continue CPAP nightly. °

## 2024-01-21 NOTE — Assessment & Plan Note (Signed)
 Controlled.  Continue hydrochlorothiazide  12.5 mg daily.  CMP reviewed from march 2025.

## 2024-01-21 NOTE — Assessment & Plan Note (Signed)
 Controlled.  Continue Lidex  0.05%, Cutivate  0.05%, Triamcinolone 0.25%, Temovate  0.05%. Following with dermatology.

## 2024-01-21 NOTE — Progress Notes (Signed)
 Subjective:    Patient ID: Preston Thomas, male    DOB: 04-Mar-1951, 73 y.o.   MRN: [id number]  HPI  Preston Thomas is a very pleasant 73 y.o. male who presents today for complete physical and follow up of chronic conditions.  Immunizations: -Tetanus: Completed in 2004 -Shingles: Completed Shingrix series -Pneumonia: Completed Prevnar 20 in 2023  Diet: Following with healthy weight and wellness Exercise: Several days weekly  Eye exam: Completes annually  Dental exam: Completed several years ago   Colonoscopy: Completed in 2018, due 2028   PSA: UTD and collected on 12/10/23   Wt Readings from Last 3 Encounters:  01/21/24 218 lb (98.9 kg)  12/30/23 211 lb (95.7 kg)  12/23/23 214 lb 12.8 oz (97.4 kg)    BP Readings from Last 3 Encounters:  01/21/24 126/72  01/06/24 (!) 142/79  12/30/23 111/68       Review of Systems  Constitutional:  Negative for unexpected weight change.  HENT:  Negative for rhinorrhea.   Respiratory:  Negative for cough and shortness of breath.   Cardiovascular:  Negative for chest pain.  Gastrointestinal:  Negative for constipation and diarrhea.  Genitourinary:  Negative for difficulty urinating.  Musculoskeletal:  Negative for arthralgias and myalgias.  Skin:  Negative for rash.  Allergic/Immunologic: Negative for environmental allergies.  Neurological:  Negative for dizziness and headaches.  Psychiatric/Behavioral:  The patient is not nervous/anxious.          Past Medical History:  Diagnosis Date   Acute conjunctivitis of both eyes 02/20/2022   Bilateral lower extremity edema    Cancer (HCC)    COLONIC POLYPS, HX OF 08/31/2007   Qualifier: Diagnosis of  By: Joelle Musca MD, Oddis Bench    DIVERTICULITIS, HX OF 08/31/2007   Qualifier: Diagnosis of  By: Comer Decamp CMA (AAMA), Natasha     DIVERTICULOSIS, COLON 08/31/2007   Qualifier: Diagnosis of  By: Joelle Musca MD, Oddis Bench    Dyspnea    Related to Covid   Gallbladder  problem    GERD (gastroesophageal reflux disease)    History of kidney stones    Hx of radiation therapy 04/11/2019   Pneumonia due to COVID-19 virus 04/19/2019   Prostate cancer (HCC)    RENAL CALCULUS, HX OF 08/31/2007   Qualifier: Diagnosis of  By: Comer Decamp CMA (AAMA), Natasha     Shortness of breath 02/21/2020   Sleep apnea    Swallowing difficulty    Syncope, near 03/27/2022    Social History   Socioeconomic History   Marital status: Married    Spouse name: Not on file   Number of children: Not on file   Years of education: Not on file   Highest education level: Not on file  Occupational History   Not on file  Tobacco Use   Smoking status: Former    Current packs/day: 0.00    Average packs/day: 1.5 packs/day for 10.0 years (15.0 ttl pk-yrs)    Types: Cigarettes    Start date: 09/28/1974    Quit date: 09/28/1984    Years since quitting: 39.3   Smokeless tobacco: Never  Vaping Use   Vaping status: Never Used  Substance and Sexual Activity   Alcohol use: Not Currently   Drug use: Never   Sexual activity: Yes  Other Topics Concern   Not on file  Social History Narrative   Not on file   Social Drivers of Health   Financial Resource Strain: Low Risk  (09/16/2023)  Overall Financial Resource Strain (CARDIA)    Difficulty of Paying Living Expenses: Not very hard  Food Insecurity: No Food Insecurity (09/16/2023)   Hunger Vital Sign    Worried About Running Out of Food in the Last Year: Never true    Ran Out of Food in the Last Year: Never true  Transportation Needs: No Transportation Needs (09/16/2023)   PRAPARE - Administrator, Civil Service (Medical): No    Lack of Transportation (Non-Medical): No  Physical Activity: Sufficiently Active (09/16/2023)   Exercise Vital Sign    Days of Exercise per Week: 5 days    Minutes of Exercise per Session: 50 min  Stress: No Stress Concern Present (09/16/2023)   [employer] of Occupational Health -  Occupational Stress Questionnaire    Feeling of Stress : Not at all  Social Connections: Socially Integrated (09/16/2023)   Social Connection and Isolation Panel [NHANES]    Frequency of Communication with Friends and Family: More than three times a week    Frequency of Social Gatherings with Friends and Family: More than three times a week    Attends Religious Services: More than 4 times per year    Active Member of Clubs or Organizations: Yes    Attends Banker Meetings: More than 4 times per year    Marital Status: Married  Catering manager Violence: Not At Risk (09/16/2023)   Humiliation, Afraid, Rape, and Kick questionnaire    Fear of Current or Ex-Partner: No    Emotionally Abused: No    Physically Abused: No    Sexually Abused: No    Past Surgical History:  Procedure Laterality Date   APPENDECTOMY  1987   CHOLECYSTECTOMY  1987   RADIOACTIVE SEED IMPLANT N/A 06/27/2019   Procedure: RADIOACTIVE SEED IMPLANT/BRACHYTHERAPY IMPLANT;  Surgeon: Geraline Knapp, MD;  Location: [facility] ORS;  Service: Urology;  Laterality: N/A;    Family History  Problem Relation Age of Onset   Early death Father    Kidney disease Father    Heart failure Sister     Allergies  Allergen Reactions   Penicillins     Passed out Did it involve swelling of the face/tongue/throat, SOB, or low BP? Yes Did it involve sudden or severe rash/hives, skin peeling, or any reaction on the inside of your mouth or nose? No Did you need to seek medical attention at a hospital or doctor's office? No When did it last happen?      30 + years If all above answers are "NO", may proceed with cephalosporin use.     Current Outpatient Medications on File Prior to Visit  Medication Sig Dispense Refill   abiraterone  acetate (ZYTIGA ) 250 MG tablet Take 4 tablets (1,000 mg total) by mouth daily. Take on an empty stomach 1 hour before or 2 hours after a meal 120 tablet 11   atorvastatin  (LIPITOR) 20 MG tablet  Take 1 tablet (20 mg total) by mouth daily. for cholesterol 90 tablet 0   hydrochlorothiazide  (MICROZIDE ) 12.5 MG capsule Take 1 capsule (12.5 mg total) by mouth daily. 30 capsule 1   metFORMIN  (GLUCOPHAGE ) 500 MG tablet Take 1 tablet (500 mg total) by mouth 2 (two) times daily with a meal. 180 tablet 0   omeprazole  (PRILOSEC) 20 MG capsule Take 1 capsule (20 mg total) by mouth 2 (two) times daily before a meal. for heartburn. 180 capsule 0   predniSONE  (DELTASONE ) 5 MG tablet Take 1 tablet (5 mg total) by  mouth daily with breakfast. 30 tablet 11   SUMAtriptan  (IMITREX ) 100 MG tablet TAKE 1 TABLET BY MOUTH AT MIGRAINE ONSET,MAY REPEAT IN 2 HOURS IF HEADACHE PERSISTS/RECURS *DO NOT EXCEED 100MG  IN 24 HOURS** 10 tablet 0   tamsulosin  (FLOMAX ) 0.4 MG CAPS capsule Take 1 capsule (0.4 mg total) by mouth daily. 90 capsule 2   vitamin B-12 (CYANOCOBALAMIN ) 500 MCG tablet Take 500 mcg by mouth daily.     vitamin C  (ASCORBIC ACID ) 250 MG tablet Take 250 mg by mouth daily.     Vitamin D , Ergocalciferol , (DRISDOL ) 1.25 MG (50000 UNIT) CAPS capsule Take 1 capsule (50,000 Units total) by mouth every other week. 5 capsule 0   Zinc 100 MG TABS Take by mouth.     tadalafil  (CIALIS ) 5 MG tablet Take 1 tablet (5 mg total) by mouth daily as needed for erectile dysfunction. (Patient not taking: Reported on 01/21/2024) 30 tablet 3   No current facility-administered medications on file prior to visit.    BP 126/72   Pulse 78   Temp 97.6 F (36.4 C) (Temporal)   Ht 5' (1.524 m)   Wt 218 lb (98.9 kg)   SpO2 96%   BMI 42.58 kg/m  Objective:   Physical Exam HENT:     Right Ear: There is impacted cerumen.     Left Ear: There is impacted cerumen.  Eyes:     Pupils: Pupils are equal, round, and reactive to light.  Cardiovascular:     Rate and Rhythm: Normal rate and regular rhythm.  Pulmonary:     Effort: Pulmonary effort is normal.     Breath sounds: Normal breath sounds.  Abdominal:     General: Bowel  sounds are normal.     Palpations: Abdomen is soft.     Tenderness: There is no abdominal tenderness.  Musculoskeletal:        General: Normal range of motion.     Cervical back: Neck supple.  Skin:    General: Skin is warm and dry.  Neurological:     Mental Status: He is alert and oriented to person, place, and time.     Cranial Nerves: No cranial nerve deficit.     Deep Tendon Reflexes:     Reflex Scores:      Patellar reflexes are 2+ on the right side and 2+ on the left side. Psychiatric:        Mood and Affect: Mood normal.           Assessment & Plan:  Hypertension, unspecified type Assessment & Plan: Controlled.  Continue hydrochlorothiazide  12.5 mg daily.  CMP reviewed from march 2025.   Hormone sensitive prostate cancer [facility]) Assessment & Plan: Following with oncology, office notes and labs reviewed from March 2025.  Complete PET scan as scheduled. Continue treatments.   Continue Flomax  0.4 mg daily.    Migraine without aura and without status migrainosus, not intractable Assessment & Plan: Controlled.  Continue sumatriptan  100 mg PRN.   OSA (obstructive sleep apnea) Assessment & Plan: Continue CPAP nightly.   Gastroesophageal reflux disease, unspecified whether esophagitis present Assessment & Plan: Controlled.  Continue omeprazole  20 mg BID.    Psoriasis Assessment & Plan: Controlled.  Continue Lidex  0.05%, Cutivate  0.05%, Triamcinolone 0.25%, Temovate  0.05%. Following with dermatology.    Prediabetes Assessment & Plan: Reviewed A1C from December 2024.  Continue metformin  500 mg BID.  Following with healthy weight and wellness.    Preventative health care Assessment & Plan: Immunizations UTD.  Colonoscopy UTD, due 2028 PSA UTD  Discussed the importance of a healthy diet and regular exercise in order for weight loss, and to reduce the risk of further co-morbidity.  Exam stable. Labs reviewed  Follow up in 1 year for  repeat physical.    Bilateral impacted cerumen Assessment & Plan: Bilateral cerumen impaction identified on exam. Patient consented to irrigation of canals bilaterally.  Bilateral canals irrigated. Patient tolerated well. TM's and canals post irrigation unremarkable.   Discussed home care instructions.     Hyperlipidemia, unspecified hyperlipidemia type Assessment & Plan: Controlled. Lipid panel reviewed from December 2024.  Continue atorvastatin  20 mg daily.         Janazia Schreier K Kiasia Chou, NP

## 2024-01-21 NOTE — Assessment & Plan Note (Signed)
 Bilateral cerumen impaction identified on exam. Patient consented to irrigation of canals bilaterally.  Bilateral canals irrigated. Patient tolerated well. TM's and canals post irrigation unremarkable.   Discussed home care instructions.

## 2024-01-21 NOTE — Assessment & Plan Note (Signed)
Controlled. ? ?Continue omeprazole 20 mg BID.  ?

## 2024-01-21 NOTE — Assessment & Plan Note (Signed)
Controlled.  Continue sumatriptan 100 mg PRN.

## 2024-01-21 NOTE — Assessment & Plan Note (Addendum)
 Following with oncology, office notes and labs reviewed from March 2025.  Complete PET scan as scheduled. Continue treatments.   Continue Flomax  0.4 mg daily.

## 2024-01-21 NOTE — Assessment & Plan Note (Signed)
 Reviewed A1C from December 2024.  Continue metformin  500 mg BID.  Following with healthy weight and wellness.

## 2024-01-21 NOTE — Assessment & Plan Note (Signed)
 Controlled. Lipid panel reviewed from December 2024.  Continue atorvastatin  20 mg daily.

## 2024-01-21 NOTE — Patient Instructions (Signed)
 It was a pleasure to see you today!

## 2024-01-22 LAB — PROSTATE-SPECIFIC AG, SERUM (LABCORP): Prostate Specific Ag, Serum: <0.1 ng/mL (ref 0.0–4.0)

## 2024-01-22 LAB — TESTOSTERONE: Testosterone: <3 ng/dL — ABNORMAL LOW (ref 264–916)

## 2024-01-24 ENCOUNTER — Other Ambulatory Visit: Payer: Self-pay

## 2024-01-24 ENCOUNTER — Encounter (HOSPITAL_COMMUNITY)
Admission: RE | Admit: 2024-01-24 | Discharge: 2024-01-24 | Disposition: A | Discharge status: Home / Self Care | Source: Ambulatory Visit

## 2024-01-24 DIAGNOSIS — Z191 Hormone sensitive malignancy status: Secondary | ICD-10-CM | POA: Insufficient documentation

## 2024-01-24 DIAGNOSIS — C61 Malignant neoplasm of prostate: Secondary | ICD-10-CM | POA: Diagnosis not present

## 2024-01-24 MED ORDER — FLOTUFOLASTAT F 18 GALLIUM 296-5846 MBQ/ML IV SOLN
8.0470 | Freq: Once | INTRAVENOUS | Status: AC
  Administered 2024-01-24: 8.047 via INTRAVENOUS

## 2024-01-25 ENCOUNTER — Encounter: Payer: Self-pay | Admitting: Genetic Counselor

## 2024-01-25 ENCOUNTER — Inpatient Hospital Stay

## 2024-01-25 ENCOUNTER — Inpatient Hospital Stay: Admitting: Genetic Counselor

## 2024-01-25 ENCOUNTER — Other Ambulatory Visit: Payer: Self-pay | Admitting: Genetic Counselor

## 2024-01-25 DIAGNOSIS — C61 Malignant neoplasm of prostate: Secondary | ICD-10-CM

## 2024-01-25 DIAGNOSIS — Z191 Hormone sensitive malignancy status: Secondary | ICD-10-CM

## 2024-01-25 LAB — GENETIC SCREENING ORDER

## 2024-01-25 NOTE — Progress Notes (Addendum)
 REFERRING PROVIDER: Lowanda Ruddy, MD [address] [place],  [place] [address]  PRIMARY PROVIDER:  Gabriel John, NP  PRIMARY REASON FOR VISIT:  1. Hormone sensitive prostate cancer (HCC)      HISTORY OF PRESENT ILLNESS:   Preston Thomas, a 73 y.o. male, was seen for a [facility] cancer genetics consultation at the request of Dr. Alita Irwin due to a personal history of prostate cancer.  Preston Thomas presents to clinic today to discuss the possibility of a hereditary predisposition to cancer, genetic testing, and to further clarify his future cancer risks, as well as potential cancer risks for family members.   In 2020, at the age of 44, Preston Thomas was diagnosed with prostate cancer.  The Gleason score at that time was 6.  More recently, his prostate cancer returned with a Gleason = 9.    CANCER HISTORY:  Oncology History  Hormone sensitive prostate cancer (HCC)  11/25/2018 Pathology Results   Prostate biopsy diagnosis:  A. BENIGN PROSTATE TISSUE. NO EVIDENCE OF MALIGNANCY.   B.  PROSTATIC ADENOCARCINOMA. GLEASON'S SCORE 6 (GRADES 3 + 3) NOTED IN 1  OUT OF 1 SUBMITTED PROSTATE CORE SEGMENTS. APPROXIMATELY 60% OF SUBMITTED  TISSUE INVOLVED.. (GRADE GROUP 1) PERINEURAL INVASION IS PRESENT.   C.  PROSTATIC ADENOCARCINOMA. GLEASON'S SCORE 6 (GRADES 3 + 3) NOTED IN 1  OUT OF 1 SUBMITTED PROSTATE CORE SEGMENTS. APPROXIMATELY 45% OF SUBMITTED  TISSUE INVOLVED.. (GRADE GROUP 1)   D.  BENIGN CONNECTIVE TISSUE AND SMOOTH MUSCLE. NO PROSTATE TISSUE SEEN.   E.  PROSTATIC ADENOCARCINOMA. GLEASON'S SCORE 8 (GRADES 3 + 5) NOTED IN 1  OUT OF 1 SUBMITTED PROSTATE CORE SEGMENTS. APPROXIMATELY 91% OF SUBMITTED  TISSUE INVOLVED. (GRADE GROUP 4) PERINEURAL INVASION IS PRESENT.   F.  PROSTATIC ADENOCARCINOMA. GLEASON'S SCORE 7 (GRADES 3 + 4) NOTED IN 1  OUT OF 1 SUBMITTED PROSTATE CORE SEGMENTS. APPROXIMATELY 85% OF SUBMITTED  TISSUE INVOLVED.Preston Thomas GLEASON GRADE 4 COMPRISES 30% OF THE TUMOR. (GRADE GROUP  2)  PERINEURAL INVASION IS PRESENT.   G.  PROSTATIC ADENOCARCINOMA. GLEASON'S SCORE 6 (GRADES 3 + 3) NOTED IN 1  OUT OF 2 SUBMITTED PROSTATE CORE SEGMENTS. APPROXIMATELY 9% OF SUBMITTED  TISSUE INVOLVED.. (GRADE GROUP 1)   H.  PROSTATIC ADENOCARCINOMA. GLEASON'S SCORE 6 (GRADES 3 + 3) NOTED IN 1  OUT OF 1 SUBMITTED PROSTATE CORE SEGMENTS. APPROXIMATELY 13% OF SUBMITTED  TISSUE INVOLVED.. (GRADE GROUP 1) PERINEURAL INVASION IS PRESENT.   I.  PROSTATIC ADENOCARCINOMA. GLEASON'S SCORE 7 (GRADES 4 + 3) NOTED IN 2  OUT OF 2 SUBMITTED PROSTATE CORE SEGMENTS. APPROXIMATELY 27% OF SUBMITTED  TISSUE INVOLVED.. (GRADE GROUP 3) GLEASON GRADE 4 COMPRISES 70% OF THE  TUMOR.   J. PROSTATIC ADENOCARCINOMA. GLEASON'S SCORE 9 (GRADES 4 + 5) NOTED IN 1  OUT OF 2 SUBMITTED PROSTATE CORE SEGMENTS. APPROXIMATELY 73% OF SUBMITTED  TISSUE INVOLVED.. (GRADE GROUP 5) PERINEURAL INVASION IS PRESENT.   K.  PROSTATIC ADENOCARCINOMA. GLEASON'S SCORE 8 (GRADES 3 + 5) NOTED IN 1  OUT OF 1 SUBMITTED PROSTATE CORE SEGMENTS. APPROXIMATELY 100% OF SUBMITTED  TISSUE INVOLVED.. (GRADE GROUP 4) PERINEURAL INVASION IS PRESENT.   L.  PROSTATIC ADENOCARCINOMA. GLEASON'S SCORE 7 (GRADES 4 + 3) NOTED IN 1  OUT OF 1 SUBMITTED PROSTATE CORE SEGMENTS. APPROXIMATELY 66% OF SUBMITTED  TISSUE INVOLVED.. (GRADE GROUP 3) GLEASON GRADE 4 COMPRISES 60% OF THE  TUMOR.    08/19/2023 PET scan   PSMA PET IMPRESSION: 1. No evidence of recurrence  in the prostate gland. 2. Three very small lymph nodes at the level of the aortic bifurcation with intense radiotracer activity most consistent with prostate cancer nodal metastasis. 3. One small node in the LEFT perirectal fat at 5 o'clock location in relation to teh rectum is indeterminate but suspicious. 4. No more distant metastatic nodal disease. No visceral metastasis or skeletal metastasis.   09/07/2023 Initial Diagnosis   Hormone sensitive prostate cancer (HCC)    Tumor Marker    PSA 08/18/18 11.54 11/11/18 12.7 10/23/19 0.29 02/19/20 0.3 04/22/20 0.15 09/02/20 0.11 04/23/21 0.04 11/28/21 <0.1 04/24/22 0.11 11/30/22 0.4 06/16/23 0.7 08/06/23 0.9   10/12/2023 Cancer Staging   Staging form: Prostate, AJCC 8th Edition - Clinical: Stage IVB (cTX, cN1, cM1a, Grade Group: 5) - Signed by Lowanda Ruddy, MD on 10/12/2023 Histologic grading system: 5 grade system     Past Medical History:  Diagnosis Date   Acute conjunctivitis of both eyes 02/20/2022   Bilateral lower extremity edema    Cancer (HCC)    COLONIC POLYPS, HX OF 08/31/2007   Qualifier: Diagnosis of  By: Joelle Musca MD, Oddis Bench    DIVERTICULITIS, HX OF 08/31/2007   Qualifier: Diagnosis of  By: Comer Decamp CMA (AAMA), Natasha     DIVERTICULOSIS, COLON 08/31/2007   Qualifier: Diagnosis of  By: Joelle Musca MD, Oddis Bench    Dyspnea    Related to Covid   Gallbladder problem    GERD (gastroesophageal reflux disease)    History of kidney stones    Hx of radiation therapy 04/11/2019   Pneumonia due to COVID-19 virus 04/19/2019   Prostate cancer (HCC)    RENAL CALCULUS, HX OF 08/31/2007   Qualifier: Diagnosis of  By: Comer Decamp CMA (AAMA), Natasha     Shortness of breath 02/21/2020   Sleep apnea    Swallowing difficulty    Syncope, near 03/27/2022    Past Surgical History:  Procedure Laterality Date   APPENDECTOMY  1987   CHOLECYSTECTOMY  1987   RADIOACTIVE SEED IMPLANT N/A 06/27/2019   Procedure: RADIOACTIVE SEED IMPLANT/BRACHYTHERAPY IMPLANT;  Surgeon: Geraline Knapp, MD;  Location: [facility] ORS;  Service: Urology;  Laterality: N/A;    Social History   Socioeconomic History   Marital status: Married    Spouse name: Not on file   Number of children: Not on file   Years of education: Not on file   Highest education level: Not on file  Occupational History   Not on file  Tobacco Use   Smoking status: Former    Current packs/day: 0.00    Average packs/day: 1.5 packs/day for 10.0 years (15.0 ttl pk-yrs)     Types: Cigarettes    Start date: 09/28/1974    Quit date: 09/28/1984    Years since quitting: 39.3   Smokeless tobacco: Never  Vaping Use   Vaping status: Never Used  Substance and Sexual Activity   Alcohol use: Not Currently   Drug use: Never   Sexual activity: Yes  Other Topics Concern   Not on file  Social History Narrative   Not on file   Social Drivers of Health   Financial Resource Strain: Low Risk  (09/16/2023)   Overall Financial Resource Strain (CARDIA)    Difficulty of Paying Living Expenses: Not very hard  Food Insecurity: No Food Insecurity (09/16/2023)   Hunger Vital Sign    Worried About Running Out of Food in the Last Year: Never true    Ran Out of Food in the Last  Year: Never true  Transportation Needs: No Transportation Needs (09/16/2023)   PRAPARE - Administrator, Civil Service (Medical): No    Lack of Transportation (Non-Medical): No  Physical Activity: Sufficiently Active (09/16/2023)   Exercise Vital Sign    Days of Exercise per Week: 5 days    Minutes of Exercise per Session: 50 min  Stress: No Stress Concern Present (09/16/2023)   [employer] of Occupational Health - Occupational Stress Questionnaire    Feeling of Stress : Not at all  Social Connections: Socially Integrated (09/16/2023)   Social Connection and Isolation Panel [NHANES]    Frequency of Communication with Friends and Family: More than three times a week    Frequency of Social Gatherings with Friends and Family: More than three times a week    Attends Religious Services: More than 4 times per year    Active Member of [employer] or Organizations: Yes    Attends Engineer, structural: More than 4 times per year    Marital Status: Married     FAMILY HISTORY:  We obtained a detailed, 4-generation family history.  Significant diagnoses reported by the patient are listed below: Family History  Problem Relation Age of Onset   Early death Father    Kidney disease Father  34   Heart failure Sister    Suicidality Maternal Grandfather    Heart failure Paternal Grandfather      The patient has three sons and a daughter who are cancer free. He had a sister who died of congestive heart failure.  Both parents are deceased.  The patient's mother died of old age in her 109's.  There is no reported family history of cancer on the maternal side.  The patient's father died of kidney failure at 59.  There is no reported family history of cancer on the paternal side.  Preston Thomas is unaware of previous family history of genetic testing for hereditary cancer risks.There is no reported Ashkenazi Jewish ancestry. There is no known consanguinity.  GENETIC COUNSELING ASSESSMENT: Preston Thomas is a 73 y.o. male with a personal history of prostate cancer which is somewhat suggestive of a hereditary cancer syndrome and predisposition to cancer given his Gleason score and high risk grade. We, therefore, discussed and recommended the following at today's visit.   DISCUSSION: We discussed that, in general, most cancer is not inherited in families, but instead is sporadic or familial. Sporadic cancers occur by chance and typically happen at older ages (>50 years) as this type of cancer is caused by genetic changes acquired during an individual's lifetime. Some families have more cancers than would be expected by chance; however, the ages or types of cancer are not consistent with a known genetic mutation or known genetic mutations have been ruled out. This type of familial cancer is thought to be due to a combination of multiple genetic, environmental, hormonal, and lifestyle factors. While this combination of factors likely increases the risk of cancer, the exact source of this risk is not currently identifiable or testable.  We discussed that up to 15% of prostate cancer is hereditary, with most cases associated with BRCA mutations.  There are other genes that can be associated with hereditary  prostate cancer syndromes.  These include ATM, CHEK2, PALB2 and HOXB13.  We discussed that testing is beneficial for several reasons including knowing how to follow individuals after completing their treatment, identifying whether potential treatment options such as PARP inhibitors would be beneficial, and understand  if other family members could be at risk for cancer and allow them to undergo genetic testing.   We reviewed the characteristics, features and inheritance patterns of hereditary cancer syndromes. We also discussed genetic testing, including the appropriate family members to test, the process of testing, insurance coverage and turn-around-time for results. We discussed the implications of a negative, positive, carrier and/or variant of uncertain significant result. Preston Thomas  was offered a common hereditary cancer panel (36+ genes) and an expanded pan-cancer panel (70+ genes). Preston Thomas was informed of the benefits and limitations of each panel, including that expanded pan-cancer panels contain genes that do not have clear management guidelines at this point in time.  We also discussed that as the number of genes included on a panel increases, the chances of variants of uncertain significance increases. Preston Thomas decided to pursue genetic testing for the CancerNext-Expanded+RNAinsight gene panel.   The CancerNext-Expanded gene panel offered by [facility] and includes sequencing, rearrangement, and RNA analysis for the following 76 genes: AIP, ALK, APC, ATM, AXIN2, BAP1, BARD1, BMPR1A, BRCA1, BRCA2, BRIP1, CDC73, CDH1, CDK4, CDKN1B, CDKN2A, CEBPA, CHEK2, CTNNA1, DDX41, DICER1, ETV6, FH, FLCN, GATA2, LZTR1, MAX, MBD4, MEN1, MET, MLH1, MSH2, MSH3, MSH6, MUTYH, NF1, NF2, NTHL1, PALB2, PHOX2B, PMS2, POT1, PRKAR1A, PTCH1, PTEN, RAD51C, RAD51D, RB1, RET, RUNX1, SDHA, SDHAF2, SDHB, SDHC, SDHD, SMAD4, SMARCA4, SMARCB1, SMARCE1, STK11, SUFU, TMEM127, TP53, TSC1, TSC2, VHL, and WT1 (sequencing and  deletion/duplication); EGFR, HOXB13, KIT, MITF, PDGFRA, POLD1, and POLE (sequencing only); EPCAM and GREM1 (deletion/duplication only).    Based on Preston Thomas's personal history of cancer, he meets medical criteria for genetic testing. Despite that he meets criteria, he may still have an out of pocket cost. We discussed that if his out of pocket cost for testing is over $100, the laboratory will call and confirm whether he wants to proceed with testing.  If the out of pocket cost of testing is less than $100 he will be billed by the genetic testing laboratory.   PLAN: After considering the risks, benefits, and limitations, Preston Thomas provided informed consent to pursue genetic testing and the blood sample was sent to [facility] for analysis of the CancerNext-Expanded+RNAinsight. Results should be available within approximately 2-3 weeks' time, at which point they will be disclosed by telephone to Preston Thomas, as will any additional recommendations warranted by these results. Preston Thomas will receive a summary of his genetic counseling visit and a copy of his results once available. This information will also be available in Epic.   Lastly, we encouraged Preston Thomas to remain in contact with cancer genetics annually so that we can continuously update the family history and inform him of any changes in cancer genetics and testing that may be of benefit for this family.   Preston Thomas questions were answered to his satisfaction today. Our contact information was provided should additional questions or concerns arise. Thank you for the referral and allowing us  to share in the care of your patient.   Merlean Pizzini P. [place], MS, CGC Licensed, Patent attorney [email or web address] phone: [phone]  60 minutes were spent on the date of the encounter in service to the patient including preparation, face-to-face consultation, documentation and care coordination.  The patient was seen alone. Drs.  Johnna Nakai, and/or Gudena were available for questions, if needed..    _______________________________________________________________________ For Office Staff:  Number of people involved in session: 2 Was an Intern/ student involved with case: no

## 2024-01-27 ENCOUNTER — Other Ambulatory Visit: Payer: Self-pay

## 2024-01-27 ENCOUNTER — Other Ambulatory Visit: Payer: Self-pay | Admitting: Pharmacy Technician

## 2024-01-27 ENCOUNTER — Telehealth: Payer: Self-pay

## 2024-01-27 NOTE — Progress Notes (Signed)
 Specialty Pharmacy Refill Coordination Note  Mayan Fillers III is a 73 y.o. male contacted today regarding refills of specialty medication(s)   Abiraterone  Acetate (ZYTIGA )    Patient requested (Patient-Rptd) Delivery   Delivery date: (Patient-Rptd) 02/01/24   Verified address: (Patient-Rptd) [address]  [place], [place]  [address]   Medication will be filled on 01/31/24.

## 2024-01-27 NOTE — Telephone Encounter (Signed)
 I called Preston Thomas to see if he could re-schedule his appointment for Monday. I left my direct line and informed Preston Thomas to call back if he is able to reschedule his appointment.

## 2024-01-31 ENCOUNTER — Other Ambulatory Visit: Payer: Self-pay

## 2024-02-01 NOTE — Telephone Encounter (Signed)
 Spoke with Preston Thomas. He states he is still having diarrhea. Encouraged to increase fluids and electrolytes. Discussed bland diet and imodium use. Will see Dr Alita Irwin on Thursday

## 2024-02-03 ENCOUNTER — Inpatient Hospital Stay

## 2024-02-03 VITALS — BP 126/74 | HR 88 | Temp 97.2°F | Resp 17 | Wt 213.0 lb

## 2024-02-03 DIAGNOSIS — C61 Malignant neoplasm of prostate: Secondary | ICD-10-CM

## 2024-02-03 DIAGNOSIS — C779 Secondary and unspecified malignant neoplasm of lymph node, unspecified: Secondary | ICD-10-CM | POA: Insufficient documentation

## 2024-02-03 DIAGNOSIS — Z79899 Other long term (current) drug therapy: Secondary | ICD-10-CM | POA: Insufficient documentation

## 2024-02-03 DIAGNOSIS — Z9189 Other specified personal risk factors, not elsewhere classified: Secondary | ICD-10-CM | POA: Diagnosis not present

## 2024-02-03 DIAGNOSIS — E7849 Other hyperlipidemia: Secondary | ICD-10-CM | POA: Diagnosis not present

## 2024-02-03 DIAGNOSIS — Z87891 Personal history of nicotine dependence: Secondary | ICD-10-CM | POA: Diagnosis not present

## 2024-02-03 DIAGNOSIS — Z191 Hormone sensitive malignancy status: Secondary | ICD-10-CM | POA: Diagnosis not present

## 2024-02-03 DIAGNOSIS — I159 Secondary hypertension, unspecified: Secondary | ICD-10-CM | POA: Diagnosis not present

## 2024-02-03 NOTE — Assessment & Plan Note (Addendum)
 Currently controlled. Continue hydrochlorothiazide  12.5 mg daily.  CMP reviewed from march 2025.

## 2024-02-03 NOTE — Assessment & Plan Note (Addendum)
 Controlled. Lipid panel reviewed from December 2024.  Continue atorvastatin  20 mg daily.

## 2024-02-03 NOTE — Assessment & Plan Note (Addendum)
 Continue AAP. Continue max CV risk factor modification Continue Lupron .  Continue Flomax  0.4 mg daily.

## 2024-02-03 NOTE — Progress Notes (Signed)
 [facility] OFFICE PROGRESS NOTE  Patient Care Team: Gabriel John, NP as PCP - General (Internal Medicine) Constancia Delton, MD as PCP - Cardiology (Cardiology) Katheleen Palmer, RN as Oncology Nurse Navigator Alvina Axon, MD as Consulting Physician (Ophthalmology)  Preston Thomas is a 73 y.o.male with history of HTN, HLD, DM, OSA being seen [facility] for prostate cancer.    Current diagnosis: M1 HSPC Initial diagnosis: cT1cN0M0 PSA 12.7, GG5 (GRADES 4 + 5)  Germline testing: not yet. Referral placed       Somatic testing: not yet Treatment: initial treatment: 05/2019 IMRT + brachytherapy + ADT. Received final leuprolide  injection 02/10/2021  Recurrence 08/2023 PET: Three very small lymph nodes at the level of the aortic bifurcation with intense radiotracer activity most consistent with prostate cancer nodal metastasis. One small node in the LEFT perirectal fat at 5 o'clock location in relation to the rectum is indeterminate but suspicious.  09/15/23 started AAP. Pre-AAP PSA 0.9.   PET showed interval improvement. Lymph nodes: The diminutive nodes anterior to the aortic bifurcation are less distinct today and measure maximally a S.U.V. max of 2.2. A compare a S.U.V. max of 7.9 on the prior exam. The tiny left perirectal node measures a S.U.V. max of 2.4 on 158/4 today versus a S.U.V. max of 3.8 on the prior.  Tolerating well. PSA now < 0.1   Last ADT with Lupron  on 01/06/2024  Will discuss at [facility].  Monitor labs, BP and side effects. Assessment & Plan Hormone sensitive prostate cancer (HCC) Continue AAP. Continue max CV risk factor modification Continue Lupron .  Continue Flomax  0.4 mg daily.  At risk for side effect of medication baseline bone mineral density study  calcium  (1000-1200 mg daily from food and supplements) and vitamin D3 (1000 IU daily) Routine dental care Control and prevent diabetes Weight-bearing exercises (30 minutes per day) Limit  alcohol consumption and avoid smoking Other hyperlipidemia Controlled. Lipid panel reviewed from December 2024. Continue atorvastatin  20 mg daily. Secondary hypertension Currently controlled. Continue hydrochlorothiazide  12.5 mg daily.  CMP reviewed from march 2025.  Orders Placed This Encounter  Procedures   CBC with Differential (Cancer Center Only)    Standing Status:   Future    Expiration Date:   02/02/2025   CMP (Cancer Center only)    Standing Status:   Future    Expiration Date:   02/02/2025   Prostate-Specific AG, Serum    Standing Status:   Future    Expiration Date:   02/02/2025   Testosterone     Standing Status:   Future    Expiration Date:   02/02/2025     Lowanda Ruddy, MD  INTERVAL HISTORY: Patient returns for follow-up. He had diarrhea over the weekend from out of town at graduation. This finally resolved and is able to drink a lot of fluid. No trouble urinating. No fever, chills or stomach.   Hot flashes daily but tolerable. Exercises is ongoing. He gets tired faster   Oncology History  Hormone sensitive prostate cancer (HCC)  11/25/2018 Pathology Results   Prostate biopsy diagnosis:  A. BENIGN PROSTATE TISSUE. NO EVIDENCE OF MALIGNANCY.   B.  PROSTATIC ADENOCARCINOMA. GLEASON'S SCORE 6 (GRADES 3 + 3) NOTED IN 1  OUT OF 1 SUBMITTED PROSTATE CORE SEGMENTS. APPROXIMATELY 60% OF SUBMITTED  TISSUE INVOLVED.. (GRADE GROUP 1) PERINEURAL INVASION IS PRESENT.   C.  PROSTATIC ADENOCARCINOMA. GLEASON'S SCORE 6 (GRADES 3 + 3) NOTED IN 1  OUT OF 1 SUBMITTED PROSTATE  CORE SEGMENTS. APPROXIMATELY 45% OF SUBMITTED  TISSUE INVOLVED.. (GRADE GROUP 1)   D.  BENIGN CONNECTIVE TISSUE AND SMOOTH MUSCLE. NO PROSTATE TISSUE SEEN.   E.  PROSTATIC ADENOCARCINOMA. GLEASON'S SCORE 8 (GRADES 3 + 5) NOTED IN 1  OUT OF 1 SUBMITTED PROSTATE CORE SEGMENTS. APPROXIMATELY 91% OF SUBMITTED  TISSUE INVOLVED. (GRADE GROUP 4) PERINEURAL INVASION IS PRESENT.   F.  PROSTATIC ADENOCARCINOMA.  GLEASON'S SCORE 7 (GRADES 3 + 4) NOTED IN 1  OUT OF 1 SUBMITTED PROSTATE CORE SEGMENTS. APPROXIMATELY 85% OF SUBMITTED  TISSUE INVOLVED.Aaron Aas GLEASON GRADE 4 COMPRISES 30% OF THE TUMOR. (GRADE GROUP  2) PERINEURAL INVASION IS PRESENT.   G.  PROSTATIC ADENOCARCINOMA. GLEASON'S SCORE 6 (GRADES 3 + 3) NOTED IN 1  OUT OF 2 SUBMITTED PROSTATE CORE SEGMENTS. APPROXIMATELY 9% OF SUBMITTED  TISSUE INVOLVED.. (GRADE GROUP 1)   H.  PROSTATIC ADENOCARCINOMA. GLEASON'S SCORE 6 (GRADES 3 + 3) NOTED IN 1  OUT OF 1 SUBMITTED PROSTATE CORE SEGMENTS. APPROXIMATELY 13% OF SUBMITTED  TISSUE INVOLVED.. (GRADE GROUP 1) PERINEURAL INVASION IS PRESENT.   I.  PROSTATIC ADENOCARCINOMA. GLEASON'S SCORE 7 (GRADES 4 + 3) NOTED IN 2  OUT OF 2 SUBMITTED PROSTATE CORE SEGMENTS. APPROXIMATELY 27% OF SUBMITTED  TISSUE INVOLVED.. (GRADE GROUP 3) GLEASON GRADE 4 COMPRISES 70% OF THE  TUMOR.   J. PROSTATIC ADENOCARCINOMA. GLEASON'S SCORE 9 (GRADES 4 + 5) NOTED IN 1  OUT OF 2 SUBMITTED PROSTATE CORE SEGMENTS. APPROXIMATELY 73% OF SUBMITTED  TISSUE INVOLVED.. (GRADE GROUP 5) PERINEURAL INVASION IS PRESENT.   K.  PROSTATIC ADENOCARCINOMA. GLEASON'S SCORE 8 (GRADES 3 + 5) NOTED IN 1  OUT OF 1 SUBMITTED PROSTATE CORE SEGMENTS. APPROXIMATELY 100% OF SUBMITTED  TISSUE INVOLVED.. (GRADE GROUP 4) PERINEURAL INVASION IS PRESENT.   L.  PROSTATIC ADENOCARCINOMA. GLEASON'S SCORE 7 (GRADES 4 + 3) NOTED IN 1  OUT OF 1 SUBMITTED PROSTATE CORE SEGMENTS. APPROXIMATELY 66% OF SUBMITTED  TISSUE INVOLVED.. (GRADE GROUP 3) GLEASON GRADE 4 COMPRISES 60% OF THE  TUMOR.    08/19/2023 PET scan   PSMA PET IMPRESSION: 1. No evidence of recurrence in the prostate gland. 2. Three very small lymph nodes at the level of the aortic bifurcation with intense radiotracer activity most consistent with prostate cancer nodal metastasis. 3. One small node in the LEFT perirectal fat at 5 o'clock location in relation to teh rectum is indeterminate but  suspicious. 4. No more distant metastatic nodal disease. No visceral metastasis or skeletal metastasis.   09/07/2023 Initial Diagnosis   Hormone sensitive prostate cancer (HCC)    Tumor Marker   PSA 08/18/18 11.54 11/11/18 12.7 10/23/19 0.29 02/19/20 0.3 04/22/20 0.15 09/02/20 0.11 04/23/21 0.04 11/28/21 <0.1 04/24/22 0.11 11/30/22 0.4 06/16/23 0.7 08/06/23 0.9   10/12/2023 Cancer Staging   Staging form: Prostate, AJCC 8th Edition - Clinical: Stage IVB (cTX, cN1, cM1a, Grade Group: 5) - Signed by Lowanda Ruddy, MD on 10/12/2023 Histologic grading system: 5 grade system    Current Outpatient Medications on File Prior to Visit  Medication Sig Dispense Refill   abiraterone  acetate (ZYTIGA ) 250 MG tablet Take 4 tablets (1,000 mg total) by mouth daily. Take on an empty stomach 1 hour before or 2 hours after a meal 120 tablet 11   atorvastatin  (LIPITOR) 20 MG tablet Take 1 tablet (20 mg total) by mouth daily. for cholesterol 90 tablet 0   hydrochlorothiazide  (MICROZIDE ) 12.5 MG capsule Take 1 capsule (12.5 mg total) by mouth daily. 30 capsule 1   metFORMIN  (  GLUCOPHAGE ) 500 MG tablet Take 1 tablet (500 mg total) by mouth 2 (two) times daily with a meal. 180 tablet 0   omeprazole  (PRILOSEC) 20 MG capsule Take 1 capsule (20 mg total) by mouth 2 (two) times daily before a meal. for heartburn. 180 capsule 0   predniSONE  (DELTASONE ) 5 MG tablet Take 1 tablet (5 mg total) by mouth daily with breakfast. 30 tablet 11   SUMAtriptan  (IMITREX ) 100 MG tablet TAKE 1 TABLET BY MOUTH AT MIGRAINE ONSET,MAY REPEAT IN 2 HOURS IF HEADACHE PERSISTS/RECURS *DO NOT EXCEED 100MG  IN 24 HOURS** 10 tablet 0   tadalafil  (CIALIS ) 5 MG tablet Take 1 tablet (5 mg total) by mouth daily as needed for erectile dysfunction. (Patient not taking: Reported on 01/21/2024) 30 tablet 3   tamsulosin  (FLOMAX ) 0.4 MG CAPS capsule Take 1 capsule (0.4 mg total) by mouth daily. 90 capsule 2   vitamin B-12 (CYANOCOBALAMIN ) 500 MCG tablet Take  500 mcg by mouth daily.     vitamin C  (ASCORBIC ACID ) 250 MG tablet Take 250 mg by mouth daily.     Vitamin D , Ergocalciferol , (DRISDOL ) 1.25 MG (50000 UNIT) CAPS capsule Take 1 capsule (50,000 Units total) by mouth every other week. 5 capsule 0   Zinc 100 MG TABS Take by mouth.     No current facility-administered medications on file prior to visit.     PHYSICAL EXAMINATION: ECOG PERFORMANCE STATUS: 0 - Asymptomatic  Vitals:   02/03/24 0833  BP: 126/74  Pulse: 88  Resp: 17  Temp: (!) 97.2 F (36.2 C)  SpO2: 96%   Filed Weights   02/03/24 0833  Weight: 213 lb (96.6 kg)   GENERAL: alert, no distress and comfortable SKIN: skin color normal  EYES:  sclera clear NECK: No palpable mass  LUNGS: clear to auscultation and percussion with normal breathing effort HEART: regular rate & rhythm  ABDOMEN: abdomen soft, non-tender and nondistended. Musculoskeletal: no edema   Relevant data reviewed during this visit included labs.

## 2024-02-03 NOTE — Assessment & Plan Note (Addendum)
 baseline bone mineral density study  calcium  (1000-1200 mg daily from food and supplements) and vitamin D3 (1000 IU daily) Routine dental care Control and prevent diabetes Weight-bearing exercises (30 minutes per day) Limit alcohol consumption and avoid smoking

## 2024-02-08 ENCOUNTER — Encounter: Payer: Self-pay | Admitting: Genetic Counselor

## 2024-02-08 DIAGNOSIS — Z1379 Encounter for other screening for genetic and chromosomal anomalies: Secondary | ICD-10-CM | POA: Insufficient documentation

## 2024-02-09 ENCOUNTER — Encounter (INDEPENDENT_AMBULATORY_CARE_PROVIDER_SITE_OTHER): Payer: Self-pay | Admitting: Physician Assistant

## 2024-02-09 ENCOUNTER — Other Ambulatory Visit: Payer: Self-pay

## 2024-02-09 ENCOUNTER — Other Ambulatory Visit (HOSPITAL_COMMUNITY): Payer: Self-pay

## 2024-02-09 ENCOUNTER — Ambulatory Visit (INDEPENDENT_AMBULATORY_CARE_PROVIDER_SITE_OTHER): Admitting: Physician Assistant

## 2024-02-09 ENCOUNTER — Telehealth: Payer: Self-pay | Admitting: Genetic Counselor

## 2024-02-09 VITALS — BP 138/76 | HR 73 | Temp 98.6°F | Ht 59.0 in | Wt 217.0 lb

## 2024-02-09 DIAGNOSIS — G4733 Obstructive sleep apnea (adult) (pediatric): Secondary | ICD-10-CM

## 2024-02-09 DIAGNOSIS — C61 Malignant neoplasm of prostate: Secondary | ICD-10-CM

## 2024-02-09 DIAGNOSIS — E559 Vitamin D deficiency, unspecified: Secondary | ICD-10-CM | POA: Diagnosis not present

## 2024-02-09 DIAGNOSIS — Z191 Hormone sensitive malignancy status: Secondary | ICD-10-CM

## 2024-02-09 DIAGNOSIS — R600 Localized edema: Secondary | ICD-10-CM | POA: Diagnosis not present

## 2024-02-09 DIAGNOSIS — E669 Obesity, unspecified: Secondary | ICD-10-CM

## 2024-02-09 DIAGNOSIS — E876 Hypokalemia: Secondary | ICD-10-CM | POA: Diagnosis not present

## 2024-02-09 DIAGNOSIS — R7303 Prediabetes: Secondary | ICD-10-CM | POA: Diagnosis not present

## 2024-02-09 DIAGNOSIS — Z6841 Body Mass Index (BMI) 40.0 and over, adult: Secondary | ICD-10-CM | POA: Diagnosis not present

## 2024-02-09 MED ORDER — HYDROCHLOROTHIAZIDE 25 MG PO TABS
25.0000 mg | ORAL_TABLET | Freq: Every day | ORAL | 3 refills | Status: DC
  Filled 2024-02-09: qty 90, 90d supply, fill #0

## 2024-02-09 MED ORDER — VITAMIN D (ERGOCALCIFEROL) 1.25 MG (50000 UNIT) PO CAPS
50000.0000 [IU] | ORAL_CAPSULE | ORAL | 0 refills | Status: DC
  Filled 2024-02-09: qty 5, fill #0

## 2024-02-09 MED ORDER — POTASSIUM CHLORIDE CRYS ER 20 MEQ PO TBCR
20.0000 meq | EXTENDED_RELEASE_TABLET | Freq: Two times a day (BID) | ORAL | 1 refills | Status: DC
  Filled 2024-02-09: qty 60, 30d supply, fill #0

## 2024-02-09 NOTE — Telephone Encounter (Signed)
 LM on VM that results are back and to please call.  Left CB instructions.

## 2024-02-09 NOTE — Telephone Encounter (Signed)
 LVM message that I was returning patients call.  Left CB instructions.

## 2024-02-09 NOTE — Progress Notes (Signed)
 SUBJECTIVE: Discussed the use of AI scribe software for clinical note transcription with the patient, who gave verbal consent to proceed.  Chief Complaint: Obesity  Interim History: He is up 6 lbs since last visit.   Keyen is here to discuss his progress with his obesity treatment plan. He is on the following a lower carbohydrate, vegetable and lean protein rich diet plan and states he is following his eating plan approximately 98 % of the time. He states he is exercising walking/weights 45 minutes 4-5 times per week.  Preston Thomas "Dorla Gartner" is a 73 year old male who presents for follow-up of his obesity treatment plan. He is currently undergoing treatment for prostate cancer.  He experiences significant fatigue, feeling exhausted upon waking but improving as the day progresses, which he attributes to his ongoing cancer treatment. [place] an airport, his heart rate increased to 155 bpm with profuse sweating after power walking, though he did not experience shortness of breath. It took several days to recover from this exertion.  He continues to exercise for 25 minutes daily ,but at a slower pace due to his current physical limitations. During a previous course of similar medication for cancer treatment, he lost significant muscle mass, but currently, we have not noticed the same degree of loss.  Recent weight gain of six pounds, though includes increased edema/water weight. He is aware of his lower extremity and some upper extremity edema, describing it as 'puffing up,' and notes that he ran out of hydrochlorothiazide , which he was taking daily. He is not currently on a potassium supplement, but now has a low potassium level on last labs.   He is on a low-carb diet and has been consistent with this dietary approach. His wife assists with meal preparation, and he has established a routine that accommodates his dietary needs. He is open to exploring meal prep  services to ease the burden of meal planning.  His prostate cancer treatment is ongoing, and his PSA levels are undetectable.  He is encouraged by recent scans and treatment plans per oncology.   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 Significant fatigue as a side effect. Considering radiation therapy for a specific lymph node, potentially leading to remission and medication cessation.  Fatigue Significant fatigue, particularly in the mornings, improving throughout the day. Likely multifactorial, related to cancer treatment and medication side effects. Advised to pace activities and rest as needed.  Lower extremity edema Significant lower extremity edema with pitting, likely exacerbated by physical exertion. Managed with hydrochlorothiazide . - Increase hydrochlorothiazide  to 25 mg once daily. - Prescribe potassium chloride  supplement, 20 mEq twice daily.  Hypokalemia Potassium level low at 3.3 mEq/L, likely due to hydrochlorothiazide -induced potassium loss. - Prescribe potassium chloride  supplement, 20 mEq twice daily.  Obesity Ongoing management with maintained muscle mass despite weight gain, attributed to cancer treatment. On a low-carb diet and exercising at a slower pace due to fatigue. Considering Zepbound for weight loss post-cancer treatment  phase.  Obstructive sleep apnea Has obstructive sleep apnea. Considering Zepbound, a weight loss drug approved for obstructive sleep apnea, post-cancer treatment phase.  Vitals Temp: 98.6 F (37 C) BP: 138/76 Pulse Rate: 73 SpO2: 97 %   Anthropometric Measurements Height: 4\' 11"  (1.499 m) Weight: 217 lb (98.4 kg) BMI (Calculated): 43.81 Weight at Last Visit: 211 lb Weight Lost Since Last Visit: 0 Weight Gained Since Last Visit: 6 lb Starting Weight: 229 lb Total Weight Loss (lbs): 12 lb (5.443 kg) Peak Weight: 238 lb   Body Composition  Body Fat %: 42.5 % Fat Mass (lbs): 92.4 lbs Muscle Mass (lbs): 119 lbs Visceral Fat Rating : 30   Other Clinical Data Today's Visit #: 25 Starting Date: 06/10/22     ASSESSMENT AND PLAN:  Diet: Radin is currently in the action stage of change. As such, his goal is to continue with weight loss efforts. He has agreed to following a lower carbohydrate, vegetable and lean protein rich diet plan.  Exercise: Cordie has been instructed to continue exercising as is for weight loss and overall health benefits.   Behavior Modification:  We discussed the following Behavioral Modification Strategies today: increasing lean protein intake, decreasing simple carbohydrates, increasing vegetables, increase H2O intake, increase high fiber foods, no skipping meals, meal planning and cooking strategies, avoiding temptations, and planning for success. We discussed various medication options to help Ethelle Herb with his weight loss efforts and we both agreed to continue current treatment plan, continue to work on nutritional and behavioral strategies to promote weight loss.  .  Return in about 6 weeks (around 03/22/2024).Aaron Aas He was informed of the importance of frequent follow up visits to maximize his success with intensive lifestyle modifications for his multiple health conditions.  Attestation Statements:   Reviewed by clinician on day of visit:  allergies, medications, problem list, medical history, surgical history, family history, social history, and previous encounter notes.   Time spent on visit including pre-visit chart review and post-visit care and charting was 34 minutes.    Daleigh Pollinger, PA-C

## 2024-02-10 NOTE — Telephone Encounter (Signed)
 Left message on VM that results are back.  Asked that he please call back.  Left CB instructions.

## 2024-02-11 ENCOUNTER — Ambulatory Visit: Payer: Self-pay | Admitting: Genetic Counselor

## 2024-02-11 DIAGNOSIS — Z1379 Encounter for other screening for genetic and chromosomal anomalies: Secondary | ICD-10-CM

## 2024-02-11 DIAGNOSIS — C61 Malignant neoplasm of prostate: Secondary | ICD-10-CM

## 2024-02-11 NOTE — Telephone Encounter (Signed)
 Returned patient's VM.  Revealed negative genetic testing.  Discussed that we do not know why he has prostate cancer. It could be due to a different gene that we are not testing, or maybe our current technology may not be able to pick something up.  It will be important for him to keep in contact with genetics to keep up with whether additional testing may be needed.  BRIP1 VUS identified.  This will not change medical management.

## 2024-02-11 NOTE — Progress Notes (Signed)
 HPI:  [name] was previously seen in the  Cancer Genetics clinic due to a personal history of cancer and concerns regarding a hereditary predisposition to cancer. Please refer to our prior cancer genetics clinic note for more information regarding our discussion, assessment and recommendations, at the time. [name] recent genetic test results were disclosed to him, as were recommendations warranted by these results. These results and recommendations are discussed in more detail below.  CANCER HISTORY:  Oncology History  Hormone sensitive prostate cancer (HCC)  11/25/2018 Pathology Results   Prostate biopsy diagnosis:  A. BENIGN PROSTATE TISSUE. NO EVIDENCE OF MALIGNANCY.   B.  PROSTATIC ADENOCARCINOMA. GLEASON'S SCORE 6 (GRADES 3 + 3) NOTED IN 1  OUT OF 1 SUBMITTED PROSTATE CORE SEGMENTS. APPROXIMATELY 60% OF SUBMITTED  TISSUE INVOLVED.. (GRADE GROUP 1) PERINEURAL INVASION IS PRESENT.   C.  PROSTATIC ADENOCARCINOMA. GLEASON'S SCORE 6 (GRADES 3 + 3) NOTED IN 1  OUT OF 1 SUBMITTED PROSTATE CORE SEGMENTS. APPROXIMATELY 45% OF SUBMITTED  TISSUE INVOLVED.. (GRADE GROUP 1)   D.  BENIGN CONNECTIVE TISSUE AND SMOOTH MUSCLE. NO PROSTATE TISSUE SEEN.   E.  PROSTATIC ADENOCARCINOMA. GLEASON'S SCORE 8 (GRADES 3 + 5) NOTED IN 1  OUT OF 1 SUBMITTED PROSTATE CORE SEGMENTS. APPROXIMATELY 91% OF SUBMITTED  TISSUE INVOLVED. (GRADE GROUP 4) PERINEURAL INVASION IS PRESENT.   F.  PROSTATIC ADENOCARCINOMA. GLEASON'S SCORE 7 (GRADES 3 + 4) NOTED IN 1  OUT OF 1 SUBMITTED PROSTATE CORE SEGMENTS. APPROXIMATELY 85% OF SUBMITTED  TISSUE INVOLVED.Preston Thomas GLEASON GRADE 4 COMPRISES 30% OF THE TUMOR. (GRADE GROUP  2) PERINEURAL INVASION IS PRESENT.   G.  PROSTATIC ADENOCARCINOMA. GLEASON'S SCORE 6 (GRADES 3 + 3) NOTED IN 1  OUT OF 2 SUBMITTED PROSTATE CORE SEGMENTS. APPROXIMATELY 9% OF SUBMITTED  TISSUE INVOLVED.. (GRADE GROUP 1)   H.  PROSTATIC ADENOCARCINOMA. GLEASON'S SCORE 6 (GRADES 3 + 3) NOTED IN 1   OUT OF 1 SUBMITTED PROSTATE CORE SEGMENTS. APPROXIMATELY 13% OF SUBMITTED  TISSUE INVOLVED.. (GRADE GROUP 1) PERINEURAL INVASION IS PRESENT.   I.  PROSTATIC ADENOCARCINOMA. GLEASON'S SCORE 7 (GRADES 4 + 3) NOTED IN 2  OUT OF 2 SUBMITTED PROSTATE CORE SEGMENTS. APPROXIMATELY 27% OF SUBMITTED  TISSUE INVOLVED.. (GRADE GROUP 3) GLEASON GRADE 4 COMPRISES 70% OF THE  TUMOR.   J. PROSTATIC ADENOCARCINOMA. GLEASON'S SCORE 9 (GRADES 4 + 5) NOTED IN 1  OUT OF 2 SUBMITTED PROSTATE CORE SEGMENTS. APPROXIMATELY 73% OF SUBMITTED  TISSUE INVOLVED.. (GRADE GROUP 5) PERINEURAL INVASION IS PRESENT.   K.  PROSTATIC ADENOCARCINOMA. GLEASON'S SCORE 8 (GRADES 3 + 5) NOTED IN 1  OUT OF 1 SUBMITTED PROSTATE CORE SEGMENTS. APPROXIMATELY 100% OF SUBMITTED  TISSUE INVOLVED.. (GRADE GROUP 4) PERINEURAL INVASION IS PRESENT.   L.  PROSTATIC ADENOCARCINOMA. GLEASON'S SCORE 7 (GRADES 4 + 3) NOTED IN 1  OUT OF 1 SUBMITTED PROSTATE CORE SEGMENTS. APPROXIMATELY 66% OF SUBMITTED  TISSUE INVOLVED.. (GRADE GROUP 3) GLEASON GRADE 4 COMPRISES 60% OF THE  TUMOR.    08/19/2023 PET scan   PSMA PET IMPRESSION: 1. No evidence of recurrence in the prostate gland. 2. Three very small lymph nodes at the level of the aortic bifurcation with intense radiotracer activity most consistent with prostate cancer nodal metastasis. 3. One small node in the LEFT perirectal fat at 5 o'clock location in relation to teh rectum is indeterminate but suspicious. 4. No more distant metastatic nodal disease. No visceral metastasis or skeletal metastasis.   09/07/2023 Initial Diagnosis   Hormone sensitive  prostate cancer (HCC)    Tumor Marker   PSA 08/18/18 11.54 11/11/18 12.7 10/23/19 0.29 02/19/20 0.3 04/22/20 0.15 09/02/20 0.11 04/23/21 0.04 11/28/21 <0.1 04/24/22 0.11 11/30/22 0.4 06/16/23 0.7 08/06/23 0.9   10/12/2023 Cancer Staging   Staging form: Prostate, AJCC 8th Edition - Clinical: Stage IVB (cTX, cN1, cM1a, Grade Group: 5) -  Signed by Lowanda Ruddy, MD on 10/12/2023 Histologic grading system: 5 grade system   02/07/2024 Genetic Testing   Negative genetic testing on the CancerNext-Expanded+RNAinsight panel.  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  The report date is Feb 07, 2024.  The CancerNext-Expanded gene panel offered by [facility] and includes sequencing, rearrangement, and RNA analysis for the following 77 genes: AIP, ALK, APC, ATM, BAP1, BARD1, BMPR1A, BRCA1, BRCA2, BRIP1, CDC73, CDH1, CDK4, CDKN1B, CDKN2A, CEBPA, CHEK2, CTNNA1, DDX41, DICER1, ETV6, FH, FLCN, GATA2, LZTR1, MAX, MBD4, MEN1, MET, MLH1, MSH2, MSH3, MSH6, MUTYH, NF1, NF2, NTHL1, PALB2, PHOX2B, PMS2, POT1, PRKAR1A, PTCH1, PTEN, RAD51C, RAD51D, RB1, RET, RPS20, RUNX1, SDHA, SDHAF2, SDHB, SDHC, SDHD, SMAD4, SMARCA4, SMARCB1, SMARCE1, STK11, SUFU, TMEM127, TP53, TSC1, TSC2, VHL, and WT1 (sequencing and deletion/duplication); AXIN2, CTNNA1, DDX41, EGFR, HOXB13, KIT, MBD4, MITF, MSH3, PDGFRA, POLD1 and POLE (sequencing only); EPCAM and GREM1 (deletion/duplication only). RNA data is routinely analyzed for use in variant interpretation for all genes.     FAMILY HISTORY:  We obtained a detailed, 4-generation family history.  Significant diagnoses are listed below: Family History  Problem Relation Age of Onset   Early death Father    Kidney disease Father 65   Heart failure Sister    Suicidality Maternal Grandfather    Heart failure Paternal Grandfather        The patient has three sons and a daughter who are cancer free. He had a sister who died of congestive heart failure.  Both parents are deceased.   The patient's mother died of old age in her 100's.  There is no reported family history of cancer on the maternal side.   The patient's father died of kidney failure at 33.  There is no reported family history of cancer on the paternal side.   [name] is unaware of previous family history of genetic testing for hereditary cancer risks.There is no  reported Ashkenazi Jewish ancestry. There is no known consanguinity  GENETIC TEST RESULTS: Genetic testing reported out on Feb 07, 2024 through the CancerNext-Expanded+RNAinsight cancer panel found no pathogenic mutations. The CancerNext-Expanded gene panel offered by [facility] and includes sequencing, rearrangement, and RNA analysis for the following 77 genes: AIP, ALK, APC, ATM, BAP1, BARD1, BMPR1A, BRCA1, BRCA2, BRIP1, CDC73, CDH1, CDK4, CDKN1B, CDKN2A, CEBPA, CHEK2, CTNNA1, DDX41, DICER1, ETV6, FH, FLCN, GATA2, LZTR1, MAX, MBD4, MEN1, MET, MLH1, MSH2, MSH3, MSH6, MUTYH, NF1, NF2, NTHL1, PALB2, PHOX2B, PMS2, POT1, PRKAR1A, PTCH1, PTEN, RAD51C, RAD51D, RB1, RET, RPS20, RUNX1, SDHA, SDHAF2, SDHB, SDHC, SDHD, SMAD4, SMARCA4, SMARCB1, SMARCE1, STK11, SUFU, TMEM127, TP53, TSC1, TSC2, VHL, and WT1 (sequencing and deletion/duplication); AXIN2, CTNNA1, DDX41, EGFR, HOXB13, KIT, MBD4, MITF, MSH3, PDGFRA, POLD1 and POLE (sequencing only); EPCAM and GREM1 (deletion/duplication only). RNA data is routinely analyzed for use in variant interpretation for all genes. The test report has been scanned into EPIC and is located under the Molecular Pathology section of the Results Review tab.  A portion of the result report is included below for reference.     We discussed with [name] that because current genetic testing is not perfect, it is possible there may be a gene  mutation in one of these genes that current testing cannot detect, but that chance is small.  We also discussed, that there could be another gene that has not yet been discovered, or that we have not yet tested, that is responsible for the cancer diagnoses in the family. It is also possible there is a hereditary cause for the cancer in the family that [name] did not inherit and therefore was not identified in his testing.  Therefore, it is important to remain in touch with cancer genetics in the future so that we can continue to offer [name] the most  up to date genetic testing.   Genetic testing did identify a variant of uncertain significance (VUS) was identified in the BRIP1 gene called p.F531V ([id number]).  At this time, it is unknown if this variant is associated with increased cancer risk or if this is a normal finding, but most variants such as this get reclassified to being inconsequential. It should not be used to make medical management decisions. With time, we suspect the lab will determine the significance of this variant, if any. If we do learn more about it, we will try to contact [name] to discuss it further. However, it is important to stay in touch with us  periodically and keep the address and phone number up to date.  ADDITIONAL GENETIC TESTING: We discussed with [name] that his genetic testing was fairly extensive.  If there are genes identified to increase cancer risk that can be analyzed in the future, we would be happy to discuss and coordinate this testing at that time.    CANCER SCREENING RECOMMENDATIONS: [name] test result is considered negative (normal).  This means that we have not identified a hereditary cause for his personal history of cancer at this time. Most cancers happen by chance and this negative test suggests that his personal history of prostate cancer may fall into this category.    Possible reasons for [name]'s negative genetic test include:  1. There may be a gene mutation in one of these genes that current testing methods cannot detect but that chance is small.  2. There could be another gene that has not yet been discovered, or that we have not yet tested, that is responsible for the cancer diagnoses in the family.  3.  There may be no hereditary risk for cancer in the family. The cancers in [name] and/or his family may be sporadic/familial or due to other genetic and environmental factors. 4. It is also possible there is a hereditary cause for the cancer in the family that [name] did not  inherit.  Therefore, it is recommended he continue to follow the cancer management and screening guidelines provided by his oncology and primary healthcare provider. An individual's cancer risk and medical management are not determined by genetic test results alone. Overall cancer risk assessment incorporates additional factors, including personal medical history, family history, and any available genetic information that may result in a personalized plan for cancer prevention and surveillance  RECOMMENDATIONS FOR FAMILY MEMBERS:   Since he did not inherit a identifiable mutation in a cancer predisposition gene included on this panel, his children could not have inherited a known mutation from his in one of these genes. Individuals in this family might be at some increased risk of developing cancer, over the general population risk, simply due to the family history of cancer.  We recommended women in this family have a yearly mammogram beginning at  age 18, or 10 years younger than the earliest onset of cancer, an annual clinical breast exam, and perform monthly breast self-exams. Women in this family should also have a gynecological exam as recommended by their primary provider. All family members should be referred for colonoscopy starting at age 48, or 79 years younger than the earliest onset of cancer.  FOLLOW-UP: Lastly, we discussed with [name] that cancer genetics is a rapidly advancing field and it is possible that new genetic tests will be appropriate for him and/or his family members in the future. We encouraged him to remain in contact with cancer genetics on an annual basis so we can update his personal and family histories and let him know of advances in cancer genetics that may benefit this family.   Our contact number was provided. [name] questions were answered to his satisfaction, and he knows he is welcome to call us  at anytime with additional questions or concerns.   Marijo Shove,  MS, [facility] Licensed, Certified Genetic Counselor [email or web address]

## 2024-02-15 ENCOUNTER — Other Ambulatory Visit: Payer: Self-pay

## 2024-02-15 DIAGNOSIS — C61 Malignant neoplasm of prostate: Secondary | ICD-10-CM

## 2024-02-15 NOTE — Progress Notes (Signed)
 GU Location of Tumor / Histology: Hormone Sensitive Prostate Ca  PSA <0.1 on 01/21/2024  Preston Thomas presented as referral from Dr. Janeann Mean [facility] Oncology) metastatic directed therapy.  Biopsies  Past/Anticipated interventions by urology, if any: NA  Past/Anticipated interventions by medical oncology, if any:  Dr. Alita Irwin   Weight changes, if any:  No  IPSS:  1 SHIM:  6  ADT makes erection impossible, no libido.  Bowel/Bladder complaints, if any:  No  Nausea/Vomiting, if any: No  Pain issues, if any:  0/10  SAFETY ISSUES: Prior radiation? Yes, prostate Brachytherapy 05/2019. Pacemaker/ICD? No Possible current pregnancy? Male Is the patient on methotrexate? No  Current Complaints / other details:

## 2024-02-15 NOTE — Progress Notes (Signed)
 Spoke to patient. Referral to radiation oncology placed. Evaluation for metastatic directed therapy.

## 2024-02-17 ENCOUNTER — Other Ambulatory Visit: Payer: Self-pay

## 2024-02-18 ENCOUNTER — Other Ambulatory Visit: Payer: Self-pay

## 2024-02-18 ENCOUNTER — Ambulatory Visit
Admission: RE | Admit: 2024-02-18 | Discharge: 2024-02-18 | Disposition: A | Discharge status: Home / Self Care | Source: Ambulatory Visit | Attending: Radiation Oncology | Admitting: Radiation Oncology

## 2024-02-18 DIAGNOSIS — C61 Malignant neoplasm of prostate: Secondary | ICD-10-CM | POA: Insufficient documentation

## 2024-02-18 DIAGNOSIS — Z191 Hormone sensitive malignancy status: Secondary | ICD-10-CM | POA: Diagnosis not present

## 2024-02-18 DIAGNOSIS — C775 Secondary and unspecified malignant neoplasm of intrapelvic lymph nodes: Secondary | ICD-10-CM | POA: Insufficient documentation

## 2024-02-18 NOTE — Progress Notes (Signed)
 Radiation Oncology         [phone] ________________________________  Initial outpatient Consultation- Conducted via MyChart video visit due to current COVID-19 concerns for limiting patient exposure  Name: Preston Thomas MRN: [id number]  Date of Service: 02/18/2024 DOB: 07-11-1951  JY:NWGNF, Murlean Armour, NP  Lowanda Ruddy, MD   REFERRING PHYSICIAN: Lowanda Ruddy, MD  DIAGNOSIS: 73 y/o man with oligometastatic hormone sensitive prostate cancer involving pelvic lymph nodes s/p definitive radiotherapy in 2020 for Gleason 4+5 prostate cancer and pre-treatment PSA of 12.7.    ICD-10-CM   1. Hormone sensitive prostate cancer (HCC)  C61    Z19.1     2. Malignant neoplasm of prostate metastatic to intrapelvic lymph node [facility])  C61    C77.5       HISTORY OF PRESENT ILLNESS: Preston Thomas is a 73 y.o. male seen at the request of Dr. Alita Irwin. He has a history of prostate cancer, treated with definitive radiotherapy, concurrent with LT-ADT in 2020. In summary, he was initially referred to Dr. Cherylene Corrente, in urology, for an elevated PSA of 12.7. He was subsequently diagnosed with Gleason 4+5 prostate cancer involving 10 of 12 cores on biopsy 11/25/18 under the care of Dr. Cherylene Corrente. He was referred to Dr. Jacalyn Martin in radiation oncology, who recommended LT-ADT concurrent with EBRT followed by an I-125 brachytherapy boost. He received EBRT from 02/22/19 through 03/30/19 and I-125 seed implant on 06/27/19. His last dose of ADT was given on 02/10/21.  His PSA responded nicely, decreased to 0.3 in 01/2020 and became undetectable in 11/2021. Following this, his PSA began to rise again, reaching 0.9 in 07/2023. This prompted restaging PSMA PET scan on 08/19/23 that showed intense radiotracer activity in three very small lymph nodes at the level of the aortic bifurcation and an indeterminate node in the left perirectal fat. No evidence of local recurrence in the prostate, distant nodal  metastatic disease, visceral metastasis, or skeletal metastasis. He was referred to Dr. Alita Irwin in 08/2023, and he was subsequently started on abiraterone /prednisone  on 09/15/23 and resumed ADT with 3 month Eligard  injections on 10/08/23. His PSA has become undetectable on most recent level from 01/21/24.   He recently underwent restaging PSMA PET scan on 01/24/24 showing a positive response to therapy with less tracer avidity of the tiny nodes anterior to aortic bifurcation and in the left perirectal space and no new or progressive disease.  He has reviewed these results with his medical oncologist, Dr. Alita Irwin, and has been kindly referred to us  today to discuss the potential role for metastasis directed therapy for the residual oligometastatic disease in the pelvic lymph nodes.  PREVIOUS RADIATION THERAPY: Yes   06/27/19: I-125 brachytherapy boost to prostate; 110 Gy  02/22/19 - 03/30/19: The prostate and pelvic nodes were treated to 50 Gy in 25 fractions of 2 Gy each  PAST MEDICAL HISTORY:  Past Medical History:  Diagnosis Date   Acute conjunctivitis of both eyes 02/20/2022   Bilateral lower extremity edema    Cancer (HCC)    COLONIC POLYPS, HX OF 08/31/2007   Qualifier: Diagnosis of  By: Joelle Musca MD, Oddis Bench    DIVERTICULITIS, HX OF 08/31/2007   Qualifier: Diagnosis of  By: Comer Decamp CMA (AAMA), Natasha     DIVERTICULOSIS, COLON 08/31/2007   Qualifier: Diagnosis of  By: Joelle Musca MD, Oddis Bench    Dyspnea    Related to Covid   Gallbladder problem    GERD (gastroesophageal reflux disease)  History of kidney stones    Hx of radiation therapy 04/11/2019   Pneumonia due to COVID-19 virus 04/19/2019   Prostate cancer (HCC)    RENAL CALCULUS, HX OF 08/31/2007   Qualifier: Diagnosis of  By: Comer Decamp CMA (AAMA), Natasha     Shortness of breath 02/21/2020   Sleep apnea    Swallowing difficulty    Syncope, near 03/27/2022      PAST SURGICAL HISTORY: Past Surgical History:  Procedure  Laterality Date   APPENDECTOMY  1987   CHOLECYSTECTOMY  1987   RADIOACTIVE SEED IMPLANT N/A 06/27/2019   Procedure: RADIOACTIVE SEED IMPLANT/BRACHYTHERAPY IMPLANT;  Surgeon: Geraline Knapp, MD;  Location: [facility] ORS;  Service: Urology;  Laterality: N/A;    FAMILY HISTORY:  Family History  Problem Relation Age of Onset   Early death Father    Kidney disease Father 68   Heart failure Sister    Suicidality Maternal Grandfather    Heart failure Paternal Grandfather     SOCIAL HISTORY:  Social History   Socioeconomic History   Marital status: Married    Spouse name: Not on file   Number of children: Not on file   Years of education: Not on file   Highest education level: Not on file  Occupational History   Not on file  Tobacco Use   Smoking status: Former    Current packs/day: 0.00    Average packs/day: 1.5 packs/day for 10.0 years (15.0 ttl pk-yrs)    Types: Cigarettes    Start date: 09/28/1974    Quit date: 09/28/1984    Years since quitting: 39.4   Smokeless tobacco: Never  Vaping Use   Vaping status: Never Used  Substance and Sexual Activity   Alcohol use: Not Currently   Drug use: Never   Sexual activity: Yes  Other Topics Concern   Not on file  Social History Narrative   Not on file   Social Drivers of Health   Financial Resource Strain: Low Risk  (09/16/2023)   Overall Financial Resource Strain (CARDIA)    Difficulty of Paying Living Expenses: Not very hard  Food Insecurity: No Food Insecurity (02/18/2024)   Hunger Vital Sign    Worried About Running Out of Food in the Last Year: Never true    Ran Out of Food in the Last Year: Never true  Transportation Needs: No Transportation Needs (02/18/2024)   PRAPARE - Administrator, Civil Service (Medical): No    Lack of Transportation (Non-Medical): No  Physical Activity: Sufficiently Active (09/16/2023)   Exercise Vital Sign    Days of Exercise per Week: 5 days    Minutes of Exercise per Session: 50 min   Stress: No Stress Concern Present (09/16/2023)   [employer] of Occupational Health - Occupational Stress Questionnaire    Feeling of Stress : Not at all  Social Connections: Socially Integrated (09/16/2023)   Social Connection and Isolation Panel [NHANES]    Frequency of Communication with Friends and Family: More than three times a week    Frequency of Social Gatherings with Friends and Family: More than three times a week    Attends Religious Services: More than 4 times per year    Active Member of [employer] or Organizations: Yes    Attends Banker Meetings: More than 4 times per year    Marital Status: Married  Catering manager Violence: Not At Risk (09/16/2023)   Humiliation, Afraid, Rape, and Kick questionnaire    Fear of  Current or Ex-Partner: No    Emotionally Abused: No    Physically Abused: No    Sexually Abused: No    ALLERGIES: Penicillins  MEDICATIONS:  Current Outpatient Medications  Medication Sig Dispense Refill   abiraterone  acetate (ZYTIGA ) 250 MG tablet Take 4 tablets (1,000 mg total) by mouth daily. Take on an empty stomach 1 hour before or 2 hours after a meal 120 tablet 11   atorvastatin  (LIPITOR) 20 MG tablet Take 1 tablet (20 mg total) by mouth daily. for cholesterol 90 tablet 0   hydrochlorothiazide  (HYDRODIURIL ) 25 MG tablet Take 1 tablet (25 mg total) by mouth daily. 90 tablet 3   metFORMIN  (GLUCOPHAGE ) 500 MG tablet Take 1 tablet (500 mg total) by mouth 2 (two) times daily with a meal. 180 tablet 0   omeprazole  (PRILOSEC) 20 MG capsule Take 1 capsule (20 mg total) by mouth 2 (two) times daily before a meal. for heartburn. 180 capsule 0   potassium chloride  SA (KLOR-CON  M) 20 MEQ tablet Take 1 tablet (20 mEq total) by mouth 2 (two) times daily. 60 tablet 1   predniSONE  (DELTASONE ) 5 MG tablet Take 1 tablet (5 mg total) by mouth daily with breakfast. 30 tablet 11   SUMAtriptan  (IMITREX ) 100 MG tablet TAKE 1 TABLET BY MOUTH AT MIGRAINE  ONSET,MAY REPEAT IN 2 HOURS IF HEADACHE PERSISTS/RECURS *DO NOT EXCEED 100MG  IN 24 HOURS** 10 tablet 0   tamsulosin  (FLOMAX ) 0.4 MG CAPS capsule Take 1 capsule (0.4 mg total) by mouth daily. 90 capsule 2   vitamin B-12 (CYANOCOBALAMIN ) 500 MCG tablet Take 500 mcg by mouth daily.     vitamin C  (ASCORBIC ACID ) 250 MG tablet Take 250 mg by mouth daily.     Vitamin D , Ergocalciferol , (DRISDOL ) 1.25 MG (50000 UNIT) CAPS capsule Take 1 capsule (50,000 Units total) by mouth every other week. 5 capsule 0   Zinc 100 MG TABS Take by mouth.     No current facility-administered medications for this encounter.    REVIEW OF SYSTEMS:  On review of systems, the patient reports that he is doing well overall. He denies any chest pain, shortness of breath, cough, fevers, chills, night sweats, unintended weight changes. He denies any bowel or bladder disturbances, and denies abdominal pain, nausea or vomiting. He denies any new musculoskeletal or joint aches or pains. A complete review of systems is obtained and is otherwise negative.    PHYSICAL EXAM:  Wt Readings from Last 3 Encounters:  02/09/24 217 lb (98.4 kg)  02/03/24 213 lb (96.6 kg)  01/21/24 218 lb (98.9 kg)   Temp Readings from Last 3 Encounters:  02/09/24 98.6 F (37 C)  02/03/24 (!) 97.2 F (36.2 C) (Temporal)  01/21/24 97.6 F (36.4 C) (Temporal)   BP Readings from Last 3 Encounters:  02/09/24 138/76  02/03/24 126/74  01/21/24 126/72   Pulse Readings from Last 3 Encounters:  02/09/24 73  02/03/24 88  01/21/24 78    /10  In general this is a well appearing Caucasian man in no acute distress. He's alert and oriented x4 and appropriate throughout the examination. Cardiopulmonary assessment is negative for acute distress and he exhibits normal effort.   KPS = 100  100 - Normal; no complaints; no evidence of disease. 90   - Able to carry on normal activity; minor signs or symptoms of disease. 80   - Normal activity with effort; some  signs or symptoms of disease. 29   - Cares for self; unable  to carry on normal activity or to do active work. 60   - Requires occasional assistance, but is able to care for most of his personal needs. 50   - Requires considerable assistance and frequent medical care. 40   - Disabled; requires special care and assistance. 30   - Severely disabled; hospital admission is indicated although death not imminent. 20   - Very sick; hospital admission necessary; active supportive treatment necessary. 10   - Moribund; fatal processes progressing rapidly. 0     - Dead  Karnofsky DA, Abelmann WH, Craver LS and Burchenal JH [phone]) The use of the nitrogen mustards in the palliative treatment of carcinoma: with particular reference to bronchogenic carcinoma Cancer 1 634-56  LABORATORY DATA:  Lab Results  Component Value Date   WBC 9.1 01/21/2024   HGB 13.5 01/21/2024   HCT 39.1 01/21/2024   MCV 89.1 01/21/2024   PLT 293 01/21/2024   Lab Results  Component Value Date   NA 145 01/21/2024   K 3.3 (L) 01/21/2024   CL 109 01/21/2024   CO2 30 01/21/2024   Lab Results  Component Value Date   ALT 25 01/21/2024   AST 17 01/21/2024   ALKPHOS 50 01/21/2024   BILITOT 0.4 01/21/2024     RADIOGRAPHY: NM PET (PSMA) SKULL TO MID THIGH Result Date: 01/27/2024 CLINICAL DATA:  Evaluate metastatic prostate cancer. Radiation seeds in 2020. ADT in January. EXAM: NUCLEAR MEDICINE PET SKULL BASE TO THIGH TECHNIQUE: 8.1 mCi Flotufolastat (Posluma ) was injected intravenously. Full-ring PET imaging was performed from the skull base to thigh after the radiotracer. CT data was obtained and used for attenuation correction and anatomic localization. COMPARISON:  08/19/2023 FINDINGS: NECK No radiotracer activity in neck lymph nodes. Incidental CT finding: Left carotid atherosclerosis. CHEST No radiotracer accumulation within mediastinal or hilar lymph nodes. No suspicious pulmonary nodules on the CT scan. Incidental CT  finding: Mild cardiomegaly. Lad coronary artery calcification. ABDOMEN/PELVIS Prostate: No focal activity in the prostate bed. Lymph nodes: The diminutive nodes anterior to the aortic bifurcation are less distinct today and measure maximally a S.U.V. max of 2.2. A compare a S.U.V. max of 7.9 on the prior exam. The tiny left perirectal node measures a S.U.V. max of 2.4 on 158/4 today versus a S.U.V. max of 3.8 on the prior. Liver: No evidence of liver metastasis. Incidental CT finding: Cholecystectomy. Normal adrenal glands. Lower pole right renal 3 mm collecting system calculus. Celiac ectasia likely due to poststenotic dilatation including on 101/4, similar. Abdominal aortic atherosclerosis. Scattered colonic diverticula. Radiation seeds in the prostate. SKELETON No focal activity to suggest skeletal metastasis. Mild bilateral gynecomastia. IMPRESSION: 1. Response to therapy of tiny nodes anterior to the aortic bifurcation and in the left perirectal space. 2. No new or progressive disease. 3. Incidental findings, including: Coronary artery atherosclerosis. Aortic Atherosclerosis (ICD10-I70.0). Right nephrolithiasis. Similar celiac ectasia. Electronically Signed   By: Lore Rode M.D.   On: 01/27/2024 12:59      IMPRESSION/PLAN: 1. 73 y.o. man with oligometastatic hormone sensitive prostate cancer involving pelvic lymph nodes s/p definitive radiotherapy in 2020 for Gleason 4+5 prostate cancer and pre-treatment PSA of 12.7.  Today, we talked to the patient about the findings and workup thus far. We discussed the natural history of prostate cancer and general treatment, highlighting the role of metastasis directed radiotherapy in the management of oligometastatic disease. We discussed the available radiation techniques, and focused on the details and logistics of delivery. The recommendation is for a  10 fraction course of ultra hypo-fractionated radiotherapy (UHRT) to the PET positive pelvic lymph nodes and  nearby nodal echelons. We reviewed the anticipated acute and late sequelae associated with radiation in this setting. The patient was encouraged to ask questions that were answered to his stated satisfaction and he is in agreement to proceed. He is tentatively scheduled for CT SIM at 3pm on Tuesday 02/22/24, in anticipation of beginning his treatments in the near future. We will share our discussion with Dr. Alita Irwin and look forward to continuing to participate in his care.   Given current concerns for patient exposure during the COVID-19 pandemic, this encounter was conducted via {video-enabled WebEx visit.} {telephone. The patient was notified in advance and was offered a WebEX meeting to allow for face to face communication but unfortunately reported that he did not have the appropriate resources/technology to support such a visit and instead preferred to proceed with telephone consult.} The patient has given verbal consent for this type of encounter. The attendants for this meeting include Kenith Payer MD, Debby Clyne PA-C, and patient, Preston Thomas. During the encounter, Kenith Payer MD and Keitha Pata PA-C were located at [facility] Radiation Oncology Department.  Patient, Preston Thomas  was located at home.  We personally spent 60 minutes in this encounter including chart review, reviewing radiological studies, meeting face-to-face with the patient, entering orders, coordinating care and completing documentation.    Arta Bihari, PA-C    Kenith Payer, MD  [facility]  Radiation Oncology Direct Dial: [phone]  Fax: [phone] [email or web address]  Skype  LinkedIn   This document serves as a record of services personally performed by Kenith Payer, MD and Keitha Pata, PA-C. It was created on their behalf by Florance Hun, a trained medical scribe. The creation of this record is based on the scribe's personal observations  and the provider's statements to them. This document has been checked and approved by the attending provider.

## 2024-02-22 ENCOUNTER — Ambulatory Visit
Admission: RE | Admit: 2024-02-22 | Discharge: 2024-02-22 | Disposition: A | Discharge status: Home / Self Care | Source: Ambulatory Visit | Attending: Radiation Oncology | Admitting: Radiation Oncology

## 2024-02-22 DIAGNOSIS — C61 Malignant neoplasm of prostate: Secondary | ICD-10-CM | POA: Insufficient documentation

## 2024-02-22 DIAGNOSIS — C775 Secondary and unspecified malignant neoplasm of intrapelvic lymph nodes: Secondary | ICD-10-CM | POA: Insufficient documentation

## 2024-02-22 DIAGNOSIS — Z191 Hormone sensitive malignancy status: Secondary | ICD-10-CM | POA: Diagnosis not present

## 2024-02-22 NOTE — Progress Notes (Signed)
  Radiation Oncology         [phone] ________________________________  Name: Preston Thomas MRN: [id number]  Date: 02/22/2024  DOB: 11-21-1950  ULTRAHYPOFRACTIONATED RADIOTHERAPY  SIMULATION AND TREATMENT PLANNING NOTE    ICD-10-CM   1. Malignant neoplasm of prostate metastatic to intrapelvic lymph node (HCC)  C61    C77.5       DIAGNOSIS:  73 y/o man with oligometastatic hormone sensitive prostate cancer involving pelvic lymph nodes s/p definitive radiotherapy in 2020 for Gleason 4+5 prostate cancer and pre-treatment PSA of 12.7.  NARRATIVE:  The patient was brought to the CT Simulation planning suite.  Identity was confirmed.  All relevant records and images related to the planned course of therapy were reviewed.  The patient freely provided informed written consent to proceed with treatment after reviewing the details related to the planned course of therapy. The consent form was witnessed and verified by the simulation staff.  Then, the patient was set-up in a stable reproducible  supine position for radiation therapy.  A BodyFix immobilization pillow was fabricated for reproducible positioning.  Surface markings were placed.  The CT images were loaded into the planning software.  The gross target volumes (GTV) and planning target volumes (PTV) were delinieated, and avoidance structures were contoured.  Treatment planning then occurred.  The radiation prescription was entered and confirmed.  A total of two complex treatment devices were fabricated in the form of the BodyFix immobilization pillow and a neck accuform cushion.  I have requested : 3D Simulation  I have requested a DVH of the following structures: targets and all normal structures near the target including bowel, bladder, rectum as noted on the radiation plan to maintain doses in adherence with established limits  SPECIAL TREATMENT PROCEDURE:  The planned course of therapy using radiation constitutes a special  treatment procedure. Special care is required in the management of this patient for the following reasons. High dose per fraction requiring special monitoring for increased toxicities of treatment including daily imaging..  The special nature of the planned course of radiotherapy will require increased physician supervision and oversight to ensure patient's safety with optimal treatment outcomes.    This requires extended time and effort.    PLAN:  The patient will receive 50 Gy in 10 fractions to PET avid nodes with 30 Gy in 10 fractions to involved echelon.  ________________________________  Trilby Fujisawa Lorri Rota, M.D.

## 2024-02-23 ENCOUNTER — Other Ambulatory Visit: Payer: Self-pay

## 2024-02-23 NOTE — Progress Notes (Signed)
 Specialty Pharmacy Refill Coordination Note  Preston Thomas is a 73 y.o. male contacted today regarding refills of specialty medication(s) Abiraterone  Acetate (ZYTIGA )   Patient requested Delivery   Delivery date: 02/29/24   Verified address: [address]  [place], [place]  [address]   Medication will be filled on 02/28/24.

## 2024-02-23 NOTE — Progress Notes (Signed)
 Specialty Pharmacy Ongoing Clinical Assessment Note  Preston Thomas is a 73 y.o. male who is being followed by the specialty pharmacy service for RxSp Oncology   Patient's specialty medication(s) reviewed today: Abiraterone  Acetate (ZYTIGA )   Missed doses in the last 4 weeks: 0   Patient/Caregiver did not have any additional questions or concerns.   Therapeutic benefit summary: Patient is achieving benefit   Adverse events/side effects summary: No adverse events/side effects   Patient's therapy is appropriate to: Continue    Goals Addressed             This Visit's Progress    Maintain optimal adherence to therapy       Patient is on track. Patient will maintain adherence          Follow up: 3 months  Jisselle Poth M Xaivier Malay Specialty Pharmacist

## 2024-02-28 DIAGNOSIS — Z191 Hormone sensitive malignancy status: Secondary | ICD-10-CM | POA: Diagnosis not present

## 2024-02-28 DIAGNOSIS — Z510 Encounter for antineoplastic radiation therapy: Secondary | ICD-10-CM | POA: Insufficient documentation

## 2024-02-28 DIAGNOSIS — C775 Secondary and unspecified malignant neoplasm of intrapelvic lymph nodes: Secondary | ICD-10-CM | POA: Diagnosis not present

## 2024-02-28 DIAGNOSIS — C61 Malignant neoplasm of prostate: Secondary | ICD-10-CM | POA: Insufficient documentation

## 2024-03-06 ENCOUNTER — Ambulatory Visit
Admission: RE | Admit: 2024-03-06 | Discharge: 2024-03-06 | Disposition: A | Discharge status: Home / Self Care | Source: Ambulatory Visit | Attending: Radiation Oncology | Admitting: Radiation Oncology

## 2024-03-06 ENCOUNTER — Other Ambulatory Visit: Payer: Self-pay

## 2024-03-06 DIAGNOSIS — Z510 Encounter for antineoplastic radiation therapy: Secondary | ICD-10-CM | POA: Diagnosis not present

## 2024-03-06 DIAGNOSIS — C775 Secondary and unspecified malignant neoplasm of intrapelvic lymph nodes: Secondary | ICD-10-CM

## 2024-03-06 LAB — RAD ONC ARIA SESSION SUMMARY
Course Elapsed Days: 0
Plan Fractions Treated to Date: 1
Plan Prescribed Dose Per Fraction: 5 Gy
Plan Total Fractions Prescribed: 10
Plan Total Prescribed Dose: 50 Gy
Reference Point Dosage Given to Date: 5 Gy
Reference Point Session Dosage Given: 5 Gy
Session Number: 1

## 2024-03-07 ENCOUNTER — Other Ambulatory Visit: Payer: Self-pay

## 2024-03-07 ENCOUNTER — Ambulatory Visit
Admission: RE | Admit: 2024-03-07 | Discharge: 2024-03-07 | Disposition: A | Discharge status: Home / Self Care | Source: Ambulatory Visit | Attending: Radiation Oncology | Admitting: Radiation Oncology

## 2024-03-07 DIAGNOSIS — Z510 Encounter for antineoplastic radiation therapy: Secondary | ICD-10-CM | POA: Diagnosis not present

## 2024-03-07 DIAGNOSIS — C61 Malignant neoplasm of prostate: Secondary | ICD-10-CM | POA: Diagnosis not present

## 2024-03-07 DIAGNOSIS — Z191 Hormone sensitive malignancy status: Secondary | ICD-10-CM | POA: Diagnosis not present

## 2024-03-07 LAB — RAD ONC ARIA SESSION SUMMARY
Course Elapsed Days: 1
Plan Fractions Treated to Date: 2
Plan Prescribed Dose Per Fraction: 5 Gy
Plan Total Fractions Prescribed: 10
Plan Total Prescribed Dose: 50 Gy
Reference Point Dosage Given to Date: 10 Gy
Reference Point Session Dosage Given: 5 Gy
Session Number: 2

## 2024-03-08 ENCOUNTER — Ambulatory Visit
Admission: RE | Admit: 2024-03-08 | Discharge: 2024-03-08 | Disposition: A | Discharge status: Home / Self Care | Source: Ambulatory Visit | Attending: Radiation Oncology | Admitting: Radiation Oncology

## 2024-03-08 ENCOUNTER — Other Ambulatory Visit: Payer: Self-pay

## 2024-03-08 DIAGNOSIS — Z191 Hormone sensitive malignancy status: Secondary | ICD-10-CM | POA: Diagnosis not present

## 2024-03-08 DIAGNOSIS — Z510 Encounter for antineoplastic radiation therapy: Secondary | ICD-10-CM | POA: Diagnosis not present

## 2024-03-08 DIAGNOSIS — C61 Malignant neoplasm of prostate: Secondary | ICD-10-CM | POA: Diagnosis not present

## 2024-03-08 LAB — RAD ONC ARIA SESSION SUMMARY
Course Elapsed Days: 2
Plan Fractions Treated to Date: 3
Plan Prescribed Dose Per Fraction: 5 Gy
Plan Total Fractions Prescribed: 10
Plan Total Prescribed Dose: 50 Gy
Reference Point Dosage Given to Date: 15 Gy
Reference Point Session Dosage Given: 5 Gy
Session Number: 3

## 2024-03-09 ENCOUNTER — Ambulatory Visit
Admission: RE | Admit: 2024-03-09 | Discharge: 2024-03-09 | Disposition: A | Discharge status: Home / Self Care | Source: Ambulatory Visit | Attending: Radiation Oncology | Admitting: Radiation Oncology

## 2024-03-09 ENCOUNTER — Other Ambulatory Visit: Payer: Self-pay

## 2024-03-09 DIAGNOSIS — C61 Malignant neoplasm of prostate: Secondary | ICD-10-CM | POA: Diagnosis not present

## 2024-03-09 DIAGNOSIS — Z191 Hormone sensitive malignancy status: Secondary | ICD-10-CM | POA: Diagnosis not present

## 2024-03-09 DIAGNOSIS — Z510 Encounter for antineoplastic radiation therapy: Secondary | ICD-10-CM | POA: Diagnosis not present

## 2024-03-09 LAB — RAD ONC ARIA SESSION SUMMARY
Course Elapsed Days: 3
Plan Fractions Treated to Date: 4
Plan Prescribed Dose Per Fraction: 5 Gy
Plan Total Fractions Prescribed: 10
Plan Total Prescribed Dose: 50 Gy
Reference Point Dosage Given to Date: 20 Gy
Reference Point Session Dosage Given: 5 Gy
Session Number: 4

## 2024-03-10 ENCOUNTER — Ambulatory Visit
Admission: RE | Admit: 2024-03-10 | Discharge: 2024-03-10 | Disposition: A | Discharge status: Home / Self Care | Source: Ambulatory Visit | Attending: Radiation Oncology

## 2024-03-10 ENCOUNTER — Other Ambulatory Visit: Payer: Self-pay

## 2024-03-10 ENCOUNTER — Ambulatory Visit
Admission: RE | Admit: 2024-03-10 | Discharge: 2024-03-10 | Disposition: A | Discharge status: Home / Self Care | Source: Ambulatory Visit | Attending: Radiation Oncology | Admitting: Radiation Oncology

## 2024-03-10 DIAGNOSIS — Z510 Encounter for antineoplastic radiation therapy: Secondary | ICD-10-CM | POA: Diagnosis not present

## 2024-03-10 DIAGNOSIS — Z191 Hormone sensitive malignancy status: Secondary | ICD-10-CM | POA: Diagnosis not present

## 2024-03-10 DIAGNOSIS — C61 Malignant neoplasm of prostate: Secondary | ICD-10-CM | POA: Diagnosis not present

## 2024-03-10 LAB — RAD ONC ARIA SESSION SUMMARY
Course Elapsed Days: 4
Plan Fractions Treated to Date: 5
Plan Prescribed Dose Per Fraction: 5 Gy
Plan Total Fractions Prescribed: 10
Plan Total Prescribed Dose: 50 Gy
Reference Point Dosage Given to Date: 25 Gy
Reference Point Session Dosage Given: 5 Gy
Session Number: 5

## 2024-03-13 ENCOUNTER — Other Ambulatory Visit: Payer: Self-pay

## 2024-03-13 ENCOUNTER — Ambulatory Visit
Admission: RE | Admit: 2024-03-13 | Discharge: 2024-03-13 | Disposition: A | Discharge status: Home / Self Care | Source: Ambulatory Visit | Attending: Radiation Oncology | Admitting: Radiation Oncology

## 2024-03-13 DIAGNOSIS — Z510 Encounter for antineoplastic radiation therapy: Secondary | ICD-10-CM | POA: Diagnosis not present

## 2024-03-13 DIAGNOSIS — C61 Malignant neoplasm of prostate: Secondary | ICD-10-CM | POA: Diagnosis not present

## 2024-03-13 DIAGNOSIS — Z191 Hormone sensitive malignancy status: Secondary | ICD-10-CM | POA: Diagnosis not present

## 2024-03-13 LAB — RAD ONC ARIA SESSION SUMMARY
Course Elapsed Days: 7
Plan Fractions Treated to Date: 6
Plan Prescribed Dose Per Fraction: 5 Gy
Plan Total Fractions Prescribed: 10
Plan Total Prescribed Dose: 50 Gy
Reference Point Dosage Given to Date: 30 Gy
Reference Point Session Dosage Given: 5 Gy
Session Number: 6

## 2024-03-14 ENCOUNTER — Other Ambulatory Visit: Payer: Self-pay

## 2024-03-14 ENCOUNTER — Ambulatory Visit
Admission: RE | Admit: 2024-03-14 | Discharge: 2024-03-14 | Disposition: A | Discharge status: Home / Self Care | Source: Ambulatory Visit | Attending: Radiation Oncology | Admitting: Radiation Oncology

## 2024-03-14 DIAGNOSIS — Z191 Hormone sensitive malignancy status: Secondary | ICD-10-CM | POA: Diagnosis not present

## 2024-03-14 DIAGNOSIS — Z510 Encounter for antineoplastic radiation therapy: Secondary | ICD-10-CM | POA: Diagnosis not present

## 2024-03-14 DIAGNOSIS — C61 Malignant neoplasm of prostate: Secondary | ICD-10-CM | POA: Diagnosis not present

## 2024-03-14 LAB — RAD ONC ARIA SESSION SUMMARY
Course Elapsed Days: 8
Plan Fractions Treated to Date: 7
Plan Prescribed Dose Per Fraction: 5 Gy
Plan Total Fractions Prescribed: 10
Plan Total Prescribed Dose: 50 Gy
Reference Point Dosage Given to Date: 35 Gy
Reference Point Session Dosage Given: 5 Gy
Session Number: 7

## 2024-03-15 ENCOUNTER — Ambulatory Visit
Admission: RE | Admit: 2024-03-15 | Discharge: 2024-03-15 | Disposition: A | Discharge status: Home / Self Care | Source: Ambulatory Visit | Attending: Radiation Oncology | Admitting: Radiation Oncology

## 2024-03-15 ENCOUNTER — Other Ambulatory Visit: Payer: Self-pay

## 2024-03-15 ENCOUNTER — Other Ambulatory Visit: Payer: Self-pay | Admitting: Urology

## 2024-03-15 ENCOUNTER — Other Ambulatory Visit (HOSPITAL_COMMUNITY): Payer: Self-pay

## 2024-03-15 DIAGNOSIS — Z510 Encounter for antineoplastic radiation therapy: Secondary | ICD-10-CM | POA: Diagnosis not present

## 2024-03-15 DIAGNOSIS — C61 Malignant neoplasm of prostate: Secondary | ICD-10-CM | POA: Diagnosis not present

## 2024-03-15 DIAGNOSIS — Z191 Hormone sensitive malignancy status: Secondary | ICD-10-CM | POA: Diagnosis not present

## 2024-03-15 LAB — RAD ONC ARIA SESSION SUMMARY
Course Elapsed Days: 9
Plan Fractions Treated to Date: 8
Plan Prescribed Dose Per Fraction: 5 Gy
Plan Total Fractions Prescribed: 10
Plan Total Prescribed Dose: 50 Gy
Reference Point Dosage Given to Date: 40 Gy
Reference Point Session Dosage Given: 5 Gy
Session Number: 8

## 2024-03-15 MED ORDER — ONDANSETRON 4 MG PO TBDP
4.0000 mg | ORAL_TABLET | Freq: Three times a day (TID) | ORAL | 0 refills | Status: DC | PRN
  Filled 2024-03-15: qty 30, 10d supply, fill #0

## 2024-03-16 ENCOUNTER — Other Ambulatory Visit: Payer: Self-pay

## 2024-03-16 ENCOUNTER — Ambulatory Visit
Admission: RE | Admit: 2024-03-16 | Discharge: 2024-03-16 | Disposition: A | Discharge status: Home / Self Care | Source: Ambulatory Visit | Attending: Radiation Oncology | Admitting: Radiation Oncology

## 2024-03-16 DIAGNOSIS — C61 Malignant neoplasm of prostate: Secondary | ICD-10-CM | POA: Diagnosis not present

## 2024-03-16 DIAGNOSIS — Z191 Hormone sensitive malignancy status: Secondary | ICD-10-CM | POA: Diagnosis not present

## 2024-03-16 DIAGNOSIS — Z510 Encounter for antineoplastic radiation therapy: Secondary | ICD-10-CM | POA: Diagnosis not present

## 2024-03-16 LAB — RAD ONC ARIA SESSION SUMMARY
Course Elapsed Days: 10
Plan Fractions Treated to Date: 9
Plan Prescribed Dose Per Fraction: 5 Gy
Plan Total Fractions Prescribed: 10
Plan Total Prescribed Dose: 50 Gy
Reference Point Dosage Given to Date: 45 Gy
Reference Point Session Dosage Given: 5 Gy
Session Number: 9

## 2024-03-17 ENCOUNTER — Other Ambulatory Visit: Payer: Self-pay

## 2024-03-17 ENCOUNTER — Ambulatory Visit
Admission: RE | Admit: 2024-03-17 | Discharge: 2024-03-17 | Disposition: A | Discharge status: Home / Self Care | Source: Ambulatory Visit | Attending: Radiation Oncology | Admitting: Radiation Oncology

## 2024-03-17 DIAGNOSIS — C61 Malignant neoplasm of prostate: Secondary | ICD-10-CM | POA: Diagnosis not present

## 2024-03-17 DIAGNOSIS — Z510 Encounter for antineoplastic radiation therapy: Secondary | ICD-10-CM | POA: Diagnosis not present

## 2024-03-17 DIAGNOSIS — Z191 Hormone sensitive malignancy status: Secondary | ICD-10-CM | POA: Diagnosis not present

## 2024-03-17 LAB — RAD ONC ARIA SESSION SUMMARY
Course Elapsed Days: 11
Plan Fractions Treated to Date: 10
Plan Prescribed Dose Per Fraction: 5 Gy
Plan Total Fractions Prescribed: 10
Plan Total Prescribed Dose: 50 Gy
Reference Point Dosage Given to Date: 50 Gy
Reference Point Session Dosage Given: 5 Gy
Session Number: 10

## 2024-03-20 ENCOUNTER — Encounter (INDEPENDENT_AMBULATORY_CARE_PROVIDER_SITE_OTHER): Payer: Self-pay

## 2024-03-20 ENCOUNTER — Ambulatory Visit (INDEPENDENT_AMBULATORY_CARE_PROVIDER_SITE_OTHER): Admitting: Physician Assistant

## 2024-03-20 NOTE — Progress Notes (Deleted)
   SUBJECTIVE: Discussed the use of AI scribe software for clinical note transcription with the patient, who gave verbal consent to proceed.  Chief Complaint: Obesity  Interim History: ***  Preston Thomas is here to discuss his progress with his obesity treatment plan. He is on the {HWW Weight Loss Plan:210964005} and states he {CHL AMB IS/IS NOT:210130109} following his eating plan approximately *** % of the time. He states he {CHL AMB IS/IS NOT:210130109} exercising *** minutes *** times per week.   OBJECTIVE: Visit Diagnoses: Problem List Items Addressed This Visit     OSA (obstructive sleep apnea)   Prediabetes - Primary   Vitamin D  deficiency   Obesity (HCC)- start BMI 44.92   Lower leg edema   Hormone sensitive prostate cancer (HCC)    No data recorded No data recorded No data recorded No data recorded   ASSESSMENT AND PLAN:  Diet: Preston Thomas {CHL AMB IS/IS NOT:210130109} currently in the action stage of change. As such, his goal is to {HWW Weight Loss Efforts:210964006}. He {HAS HAS WNU:81165} agreed to {HWW Weight Loss Plan:210964005}.  Exercise: Preston Thomas has been instructed {HWW Exercise:210964007} for weight loss and overall health benefits.   Behavior Modification:  We discussed the following Behavioral Modification Strategies today: {HWW Behavior Modification:210964008}. We discussed various medication options to help Preston Thomas with his weight loss efforts and we both agreed to ***.  No follow-ups on file.SABRA He was informed of the importance of frequent follow up visits to maximize his success with intensive lifestyle modifications for his multiple health conditions.  Attestation Statements:   Reviewed by clinician on day of visit: allergies, medications, problem list, medical history, surgical history, family history, social history, and previous encounter notes.   Time spent on visit including pre-visit chart review and post-visit care and charting was *** minutes.     Deaira Leckey, PA-C

## 2024-03-20 NOTE — Radiation Completion Notes (Signed)
 Patient Name: [name], [name] MRN: [id number] Date of Birth: Feb 19, 1951 Referring Physician: PAULETTA CHIHUAHUA, M.D. Date of Service: 2024-03-20 Radiation Oncologist: Adina Barge, M.D. [facility]                             RADIATION ONCOLOGY END OF TREATMENT NOTE     Diagnosis: C61 Malignant neoplasm of prostate Staging on 2023-10-12: Hormone sensitive prostate cancer (HCC) T=cTX, N=cN1, M=cM1a Intent: Curative     ==========DELIVERED PLANS==========  First Treatment Date: 2024-03-06 Last Treatment Date: 2024-03-17   Plan Name: Pelvis_UHRT Site: Pelvis Technique: IMRT Mode: Photon Dose Per Fraction: 5 Gy Prescribed Dose (Delivered / Prescribed): 50 Gy / 50 Gy Prescribed Fxs (Delivered / Prescribed): 10 / 10     ==========ON TREATMENT VISIT DATES========== 2024-03-10, 2024-03-17     ==========UPCOMING VISITS==========       ==========APPENDIX - ON TREATMENT VISIT NOTES==========   See weekly On Treatment Notes in Epic for details in the Media tab (listed as Progress notes on the On Treatment Visit Dates listed above).

## 2024-03-21 DIAGNOSIS — G4733 Obstructive sleep apnea (adult) (pediatric): Secondary | ICD-10-CM | POA: Diagnosis not present

## 2024-03-23 ENCOUNTER — Other Ambulatory Visit: Payer: Self-pay

## 2024-03-23 NOTE — Progress Notes (Signed)
 Specialty Pharmacy Refill Coordination Note  Preston Thomas is a 73 y.o. male contacted today regarding refills of specialty medication(s) Abiraterone  Acetate (ZYTIGA )   Patient requested Delivery   Delivery date: 03/28/24   Verified address: [address]  [place], [place]  [address]   Medication will be filled on 06.30.25.

## 2024-03-25 ENCOUNTER — Other Ambulatory Visit (HOSPITAL_COMMUNITY): Payer: Self-pay

## 2024-03-27 ENCOUNTER — Other Ambulatory Visit (HOSPITAL_COMMUNITY): Payer: Self-pay

## 2024-03-27 ENCOUNTER — Other Ambulatory Visit: Payer: Self-pay

## 2024-04-02 ENCOUNTER — Other Ambulatory Visit: Payer: Self-pay | Admitting: Primary Care

## 2024-04-02 DIAGNOSIS — E785 Hyperlipidemia, unspecified: Secondary | ICD-10-CM

## 2024-04-02 MED ORDER — ATORVASTATIN CALCIUM 20 MG PO TABS
20.0000 mg | ORAL_TABLET | Freq: Every day | ORAL | 2 refills | Status: AC
  Filled 2024-04-02: qty 90, 90d supply, fill #0
  Filled 2024-07-01: qty 90, 90d supply, fill #1
  Filled 2024-09-29: qty 90, 90d supply, fill #2

## 2024-04-03 ENCOUNTER — Other Ambulatory Visit (HOSPITAL_COMMUNITY): Payer: Self-pay

## 2024-04-03 ENCOUNTER — Other Ambulatory Visit: Payer: Self-pay

## 2024-04-03 NOTE — Telephone Encounter (Signed)
 Preston Thomas says he is still doing a liquid diet because when he tries to eat solid, he has a lot of cramping and has to go to the BR a lot. He has been drinking chicken broth and protein drinks. Has diarrhea as soon as he eats. Has had gall bladder removed so he has dumping syndrome

## 2024-04-04 ENCOUNTER — Encounter: Payer: Self-pay | Admitting: Nutrition

## 2024-04-04 ENCOUNTER — Ambulatory Visit: Admitting: Nutrition

## 2024-04-04 NOTE — Progress Notes (Signed)
 I was contacted by nursing to contact patient for diet advancement secondary to diarrhea.  Patient is a 73 year old male diagnosed with prostate cancer who completed radiation therapy to his pelvis on June 20.  He is a patient of Dr. Tina and Dr. Patrcia.  Patient reports ongoing diarrhea.  Currently tolerating broth, rice, chopped chicken and protein shakes.  He tried coleslaw and other vegetables and it tore him up .  He is ready to try some solid food.  Educated patient on low fiber diet.  Reviewed foods allowed and foods to avoid.  Encourage patient to advance diet slowly.  Encouraged food journal to document intolerances.  Lactose intolerance could be contributing to diarrhea. Educated on determining if dairy exacerbates diarrhea.  Reviewed low lactose food options.  Emailed low fiber diet and diarrhea fact sheet to patient at [email or web address].  Provided contact information for further questions.

## 2024-04-11 ENCOUNTER — Telehealth: Payer: Self-pay | Admitting: Medical Oncology

## 2024-04-11 ENCOUNTER — Other Ambulatory Visit (HOSPITAL_COMMUNITY): Payer: Self-pay

## 2024-04-11 ENCOUNTER — Inpatient Hospital Stay

## 2024-04-11 DIAGNOSIS — Z87891 Personal history of nicotine dependence: Secondary | ICD-10-CM | POA: Diagnosis not present

## 2024-04-11 DIAGNOSIS — E876 Hypokalemia: Secondary | ICD-10-CM | POA: Diagnosis not present

## 2024-04-11 DIAGNOSIS — E1159 Type 2 diabetes mellitus with other circulatory complications: Secondary | ICD-10-CM | POA: Diagnosis not present

## 2024-04-11 DIAGNOSIS — Z79899 Other long term (current) drug therapy: Secondary | ICD-10-CM | POA: Insufficient documentation

## 2024-04-11 DIAGNOSIS — Z5111 Encounter for antineoplastic chemotherapy: Secondary | ICD-10-CM | POA: Insufficient documentation

## 2024-04-11 DIAGNOSIS — C61 Malignant neoplasm of prostate: Secondary | ICD-10-CM | POA: Insufficient documentation

## 2024-04-11 DIAGNOSIS — I10 Essential (primary) hypertension: Secondary | ICD-10-CM | POA: Insufficient documentation

## 2024-04-11 DIAGNOSIS — C779 Secondary and unspecified malignant neoplasm of lymph node, unspecified: Secondary | ICD-10-CM | POA: Diagnosis not present

## 2024-04-11 DIAGNOSIS — Z191 Hormone sensitive malignancy status: Secondary | ICD-10-CM

## 2024-04-11 LAB — CBC WITH DIFFERENTIAL (CANCER CENTER ONLY)
Abs Immature Granulocytes: 0.07 K/uL (ref 0.00–0.07)
Basophils Absolute: 0.1 K/uL (ref 0.0–0.1)
Basophils Relative: 1 %
Eosinophils Absolute: 0.2 K/uL (ref 0.0–0.5)
Eosinophils Relative: 2 %
HCT: 40.1 % (ref 39.0–52.0)
Hemoglobin: 14.4 g/dL (ref 13.0–17.0)
Immature Granulocytes: 1 %
Lymphocytes Relative: 9 %
Lymphs Abs: 0.8 K/uL (ref 0.7–4.0)
MCH: 30.9 pg (ref 26.0–34.0)
MCHC: 35.9 g/dL (ref 30.0–36.0)
MCV: 86.1 fL (ref 80.0–100.0)
Monocytes Absolute: 1.1 K/uL — ABNORMAL HIGH (ref 0.1–1.0)
Monocytes Relative: 12 %
Neutro Abs: 6.9 K/uL (ref 1.7–7.7)
Neutrophils Relative %: 75 %
Platelet Count: 399 K/uL (ref 150–400)
RBC: 4.66 MIL/uL (ref 4.22–5.81)
RDW: 13.6 % (ref 11.5–15.5)
WBC Count: 9.2 K/uL (ref 4.0–10.5)
nRBC: 0 % (ref 0.0–0.2)

## 2024-04-11 LAB — CMP (CANCER CENTER ONLY)
ALT: 28 U/L (ref 0–44)
AST: 17 U/L (ref 15–41)
Albumin: 3.8 g/dL (ref 3.5–5.0)
Alkaline Phosphatase: 66 U/L (ref 38–126)
Anion gap: 9 (ref 5–15)
BUN: 18 mg/dL (ref 8–23)
CO2: 38 mmol/L — ABNORMAL HIGH (ref 22–32)
Calcium: 9.4 mg/dL (ref 8.9–10.3)
Chloride: 97 mmol/L — ABNORMAL LOW (ref 98–111)
Creatinine: 1.04 mg/dL (ref 0.61–1.24)
GFR, Estimated: >60 mL/min (ref >60)
Glucose, Bld: 109 mg/dL — ABNORMAL HIGH (ref 70–99)
Potassium: 2.6 mmol/L — CL (ref 3.5–5.1)
Sodium: 144 mmol/L (ref 135–145)
Total Bilirubin: 0.6 mg/dL (ref 0.0–1.2)
Total Protein: 6.8 g/dL (ref 6.5–8.1)

## 2024-04-11 MED ORDER — POTASSIUM CHLORIDE CRYS ER 20 MEQ PO TBCR
20.0000 meq | EXTENDED_RELEASE_TABLET | Freq: Two times a day (BID) | ORAL | 0 refills | Status: DC
  Filled 2024-04-11: qty 14, 7d supply, fill #0

## 2024-04-11 NOTE — Telephone Encounter (Signed)
 Hypokalemia- Pt notified of low potassium and that Dr. Sherrod prescribed a K+ supplement for 7 days. He voiced understanding.

## 2024-04-11 NOTE — Progress Notes (Signed)
 CRITICAL VALUE STICKER  CRITICAL VALUE: Potassium 2.6  RECEIVER (on-site recipient of call): Rosina   DATE & TIME NOTIFIED: 04/11/24 @ 9:23  MESSENGER (representative from lab): Heather   MD NOTIFIED: Dr. Tina on [facility]- sent to Dr. Sherrod for review   TIME OF NOTIFICATION: 9:40  RESPONSE: Patient has appointment with Dr. Tina on 04/13/24

## 2024-04-12 LAB — PROSTATE-SPECIFIC AG, SERUM (LABCORP): Prostate Specific Ag, Serum: <0.1 ng/mL (ref 0.0–4.0)

## 2024-04-12 LAB — TESTOSTERONE: Testosterone: <3 ng/dL — ABNORMAL LOW (ref 264–916)

## 2024-04-12 NOTE — Progress Notes (Unsigned)
 [facility] OFFICE PROGRESS NOTE  Patient Care Team: Gretta Comer POUR, NP as PCP - General (Internal Medicine) Darliss Rogue, MD as PCP - Cardiology (Cardiology) Vertell Pont, RN as Oncology Nurse Navigator Charmayne Molly, MD as Consulting Physician (Ophthalmology)  Preston Thomas is a 73 y.o.male with history of HTN, HLD, DM, OSA [facility].    Current diagnosis: M1 HSPC Initial diagnosis: cT1cN0M0 PSA 12.7, GG5 (GRADES 4 + 5)  Germline testing: Negative genetic testing on the CancerNext-Expanded+RNAinsight panel. 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    Somatic testing: not yet Treatment: initial treatment: 05/2019 IMRT + brachytherapy + ADT. Received final leuprolide  injection 02/10/2021  Recurrence 08/2023 PET: Three very small lymph nodes at the level of the aortic bifurcation with intense radiotracer activity most consistent with prostate cancer nodal metastasis. One small node in the LEFT perirectal fat at 5 o'clock location in relation to the rectum is indeterminate but suspicious.   09/15/23 started AAP. Pre-AAP PSA 0.9.   Repeat PET showed response to treatment with residual lymph nodes now s/p radiation on 6/20. Assessment & Plan Hormone sensitive prostate cancer (HCC) Continue AAP. Continue max CV risk factor modification Continue Lupron . Last ADT with Lupron  on 01/06/2024  Schedule fro Lupron  for next week  Hypokalemia Secondary to radiation induced diarrhea. Expect recovery over time. Potassium chloride  20 mEq twice daily. Refilled today Repeat lab scheduled on 7/24. Diarrhea, unspecified type From radiation. Improving on bland diet. Using electrolyte fluid  Patient will obtain labs next week.  Will return to see me in mid August with labs and MD visit.  He will call me earlier if new concerning symptoms especially if diarrhea worsening.  Orders Placed This Encounter  Procedures   Magnesium    Standing Status:    Future    Expiration Date:   04/13/2025   Basic Metabolic Panel - Cancer Center Only    Standing Status:   Future    Expiration Date:   04/13/2025     Preston JAYSON Chihuahua, MD  INTERVAL HISTORY: Patient returns for follow-up. Diarrhea improving weekly. Using bland food with help. No bloody stool. He is taking flomax  helps with hesitancy.  No chest pain, palpitation.  Started potassium today.  Oncology History  Hormone sensitive prostate cancer (HCC)  11/25/2018 Pathology Results   Prostate biopsy diagnosis:  A. BENIGN PROSTATE TISSUE. NO EVIDENCE OF MALIGNANCY.   B.  PROSTATIC ADENOCARCINOMA. GLEASON'S SCORE 6 (GRADES 3 + 3) NOTED IN 1  OUT OF 1 SUBMITTED PROSTATE CORE SEGMENTS. APPROXIMATELY 60% OF SUBMITTED  TISSUE INVOLVED.. (GRADE GROUP 1) PERINEURAL INVASION IS PRESENT.   C.  PROSTATIC ADENOCARCINOMA. GLEASON'S SCORE 6 (GRADES 3 + 3) NOTED IN 1  OUT OF 1 SUBMITTED PROSTATE CORE SEGMENTS. APPROXIMATELY 45% OF SUBMITTED  TISSUE INVOLVED.. (GRADE GROUP 1)   D.  BENIGN CONNECTIVE TISSUE AND SMOOTH MUSCLE. NO PROSTATE TISSUE SEEN.   E.  PROSTATIC ADENOCARCINOMA. GLEASON'S SCORE 8 (GRADES 3 + 5) NOTED IN 1  OUT OF 1 SUBMITTED PROSTATE CORE SEGMENTS. APPROXIMATELY 91% OF SUBMITTED  TISSUE INVOLVED. (GRADE GROUP 4) PERINEURAL INVASION IS PRESENT.   F.  PROSTATIC ADENOCARCINOMA. GLEASON'S SCORE 7 (GRADES 3 + 4) NOTED IN 1  OUT OF 1 SUBMITTED PROSTATE CORE SEGMENTS. APPROXIMATELY 85% OF SUBMITTED  TISSUE INVOLVED.SABRA GLEASON GRADE 4 COMPRISES 30% OF THE TUMOR. (GRADE GROUP  2) PERINEURAL INVASION IS PRESENT.   G.  PROSTATIC ADENOCARCINOMA. GLEASON'S SCORE 6 (GRADES 3 + 3) NOTED IN  1  OUT OF 2 SUBMITTED PROSTATE CORE SEGMENTS. APPROXIMATELY 9% OF SUBMITTED  TISSUE INVOLVED.. (GRADE GROUP 1)   H.  PROSTATIC ADENOCARCINOMA. GLEASON'S SCORE 6 (GRADES 3 + 3) NOTED IN 1  OUT OF 1 SUBMITTED PROSTATE CORE SEGMENTS. APPROXIMATELY 13% OF SUBMITTED  TISSUE INVOLVED.. (GRADE GROUP 1) PERINEURAL  INVASION IS PRESENT.   I.  PROSTATIC ADENOCARCINOMA. GLEASON'S SCORE 7 (GRADES 4 + 3) NOTED IN 2  OUT OF 2 SUBMITTED PROSTATE CORE SEGMENTS. APPROXIMATELY 27% OF SUBMITTED  TISSUE INVOLVED.. (GRADE GROUP 3) GLEASON GRADE 4 COMPRISES 70% OF THE  TUMOR.   J. PROSTATIC ADENOCARCINOMA. GLEASON'S SCORE 9 (GRADES 4 + 5) NOTED IN 1  OUT OF 2 SUBMITTED PROSTATE CORE SEGMENTS. APPROXIMATELY 73% OF SUBMITTED  TISSUE INVOLVED.. (GRADE GROUP 5) PERINEURAL INVASION IS PRESENT.   K.  PROSTATIC ADENOCARCINOMA. GLEASON'S SCORE 8 (GRADES 3 + 5) NOTED IN 1  OUT OF 1 SUBMITTED PROSTATE CORE SEGMENTS. APPROXIMATELY 100% OF SUBMITTED  TISSUE INVOLVED.. (GRADE GROUP 4) PERINEURAL INVASION IS PRESENT.   L.  PROSTATIC ADENOCARCINOMA. GLEASON'S SCORE 7 (GRADES 4 + 3) NOTED IN 1  OUT OF 1 SUBMITTED PROSTATE CORE SEGMENTS. APPROXIMATELY 66% OF SUBMITTED  TISSUE INVOLVED.. (GRADE GROUP 3) GLEASON GRADE 4 COMPRISES 60% OF THE  TUMOR.    08/19/2023 PET scan   PSMA PET IMPRESSION: 1. No evidence of recurrence in the prostate gland. 2. Three very small lymph nodes at the level of the aortic bifurcation with intense radiotracer activity most consistent with prostate cancer nodal metastasis. 3. One small node in the LEFT perirectal fat at 5 o'clock location in relation to teh rectum is indeterminate but suspicious. 4. No more distant metastatic nodal disease. No visceral metastasis or skeletal metastasis.   09/07/2023 Initial Diagnosis   Hormone sensitive prostate cancer (HCC)    Tumor Marker   PSA 08/18/18 11.54 11/11/18 12.7 10/23/19 0.29 02/19/20 0.3 04/22/20 0.15 09/02/20 0.11 04/23/21 0.04 11/28/21 <0.1 04/24/22 0.11 11/30/22 0.4 06/16/23 0.7 08/06/23 0.9   10/12/2023 Cancer Staging   Staging form: Prostate, AJCC 8th Edition - Clinical: Stage IVB (cTX, cN1, cM1a, Grade Group: 5) - Signed by Tina Preston BROCKS, MD on 10/12/2023 Histologic grading system: 5 grade system   02/07/2024 Genetic Testing   Negative  genetic testing on the CancerNext-Expanded+RNAinsight panel.  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  The report date is Feb 07, 2024.  The CancerNext-Expanded gene panel offered by [facility] and includes sequencing, rearrangement, and RNA analysis for the following 77 genes: AIP, ALK, APC, ATM, BAP1, BARD1, BMPR1A, BRCA1, BRCA2, BRIP1, CDC73, CDH1, CDK4, CDKN1B, CDKN2A, CEBPA, CHEK2, CTNNA1, DDX41, DICER1, ETV6, FH, FLCN, GATA2, LZTR1, MAX, MBD4, MEN1, MET, MLH1, MSH2, MSH3, MSH6, MUTYH, NF1, NF2, NTHL1, PALB2, PHOX2B, PMS2, POT1, PRKAR1A, PTCH1, PTEN, RAD51C, RAD51D, RB1, RET, RPS20, RUNX1, SDHA, SDHAF2, SDHB, SDHC, SDHD, SMAD4, SMARCA4, SMARCB1, SMARCE1, STK11, SUFU, TMEM127, TP53, TSC1, TSC2, VHL, and WT1 (sequencing and deletion/duplication); AXIN2, CTNNA1, DDX41, EGFR, HOXB13, KIT, MBD4, MITF, MSH3, PDGFRA, POLD1 and POLE (sequencing only); EPCAM and GREM1 (deletion/duplication only). RNA data is routinely analyzed for use in variant interpretation for all genes.    Current Outpatient Medications on File Prior to Visit  Medication Sig Dispense Refill   abiraterone  acetate (ZYTIGA ) 250 MG tablet Take 4 tablets (1,000 mg total) by mouth daily. Take on an empty stomach 1 hour before or 2 hours after a meal 120 tablet 11   atorvastatin  (LIPITOR) 20 MG tablet Take 1 tablet (20 mg total) by mouth daily.  for cholesterol 90 tablet 2   hydrochlorothiazide  (HYDRODIURIL ) 25 MG tablet Take 1 tablet (25 mg total) by mouth daily. 90 tablet 3   metFORMIN  (GLUCOPHAGE ) 500 MG tablet Take 1 tablet (500 mg total) by mouth 2 (two) times daily with a meal. 180 tablet 0   omeprazole  (PRILOSEC) 20 MG capsule Take 1 capsule (20 mg total) by mouth 2 (two) times daily before a meal for heartburn. 180 capsule 0   ondansetron  (ZOFRAN -ODT) 4 MG disintegrating tablet Take 1 tablet (4 mg total) by mouth every 8 (eight) hours as needed for nausea or vomiting. 30 tablet 0   potassium chloride  SA (KLOR-CON  M) 20 MEQ tablet Take 1  tablet (20 mEq total) by mouth 2 (two) times daily. Take for 7 days. 14 tablet 0   predniSONE  (DELTASONE ) 5 MG tablet Take 1 tablet (5 mg total) by mouth daily with breakfast. 30 tablet 11   SUMAtriptan  (IMITREX ) 100 MG tablet TAKE 1 TABLET BY MOUTH AT MIGRAINE ONSET,MAY REPEAT IN 2 HOURS IF HEADACHE PERSISTS/RECURS *DO NOT EXCEED 100MG  IN 24 HOURS** 10 tablet 0   tamsulosin  (FLOMAX ) 0.4 MG CAPS capsule Take 1 capsule (0.4 mg total) by mouth daily. 90 capsule 2   vitamin B-12 (CYANOCOBALAMIN ) 500 MCG tablet Take 500 mcg by mouth daily.     vitamin C  (ASCORBIC ACID ) 250 MG tablet Take 250 mg by mouth daily.     Vitamin D , Ergocalciferol , (DRISDOL ) 1.25 MG (50000 UNIT) CAPS capsule Take 1 capsule (50,000 Units total) by mouth every other week. 5 capsule 0   Zinc 100 MG TABS Take by mouth.     No current facility-administered medications on file prior to visit.     PHYSICAL EXAMINATION: ECOG PERFORMANCE STATUS: 1 - Symptomatic but completely ambulatory  Vitals:   04/13/24 0839  BP: 103/67  Pulse: 94  Resp: 18  Temp: (!) 97.5 F (36.4 C)  SpO2: 95%   Filed Weights   04/13/24 0839  Weight: 197 lb 14.4 oz (89.8 kg)    GENERAL: alert, no distress and comfortable SKIN: skin color normal  LUNGS: clear to auscultation and percussion with normal breathing effort HEART: regular rate & rhythm  ABDOMEN: abdomen soft, non-tender and nondistended. Musculoskeletal: no edema  Relevant data reviewed during this visit included labs.

## 2024-04-13 ENCOUNTER — Inpatient Hospital Stay

## 2024-04-13 ENCOUNTER — Other Ambulatory Visit (HOSPITAL_COMMUNITY): Payer: Self-pay

## 2024-04-13 VITALS — BP 103/67 | HR 94 | Temp 97.5°F | Resp 18 | Wt 197.9 lb

## 2024-04-13 DIAGNOSIS — C61 Malignant neoplasm of prostate: Secondary | ICD-10-CM | POA: Diagnosis not present

## 2024-04-13 DIAGNOSIS — R197 Diarrhea, unspecified: Secondary | ICD-10-CM | POA: Insufficient documentation

## 2024-04-13 DIAGNOSIS — E876 Hypokalemia: Secondary | ICD-10-CM | POA: Insufficient documentation

## 2024-04-13 DIAGNOSIS — Z191 Hormone sensitive malignancy status: Secondary | ICD-10-CM | POA: Diagnosis not present

## 2024-04-13 DIAGNOSIS — Z5111 Encounter for antineoplastic chemotherapy: Secondary | ICD-10-CM | POA: Diagnosis not present

## 2024-04-13 MED ORDER — POTASSIUM CHLORIDE CRYS ER 20 MEQ PO TBCR
20.0000 meq | EXTENDED_RELEASE_TABLET | Freq: Two times a day (BID) | ORAL | 0 refills | Status: DC
  Filled 2024-04-13 – 2024-04-20 (×2): qty 30, 15d supply, fill #0

## 2024-04-13 NOTE — Assessment & Plan Note (Addendum)
 From radiation. Improving on bland diet. Using electrolyte fluid

## 2024-04-13 NOTE — Assessment & Plan Note (Addendum)
 Continue AAP. Continue max CV risk factor modification Continue Lupron . Last ADT with Lupron  on 01/06/2024  Schedule fro Lupron  for next week

## 2024-04-13 NOTE — Assessment & Plan Note (Signed)
 Currently controlled. Continue hydrochlorothiazide  12.5 mg daily.  CMP reviewed from march 2025.

## 2024-04-13 NOTE — Assessment & Plan Note (Addendum)
 Secondary to radiation induced diarrhea. Expect recovery over time. Potassium chloride  20 mEq twice daily. Refilled today Repeat lab scheduled on 7/24.

## 2024-04-17 ENCOUNTER — Other Ambulatory Visit

## 2024-04-18 ENCOUNTER — Encounter (INDEPENDENT_AMBULATORY_CARE_PROVIDER_SITE_OTHER): Payer: Self-pay

## 2024-04-18 ENCOUNTER — Other Ambulatory Visit: Payer: Self-pay

## 2024-04-18 NOTE — Progress Notes (Signed)
 Specialty Pharmacy Refill Coordination Note  Preston Thomas is a 73 y.o. male contacted today regarding refills of specialty medication(s) Abiraterone  Acetate (ZYTIGA )   Patient requested (Patient-Rptd) Delivery   Delivery date: 04/19/24   Verified address: (Patient-Rptd) [address] [place], [place]  [address]   Medication will be filled on 07.22.25.

## 2024-04-20 ENCOUNTER — Other Ambulatory Visit: Payer: Self-pay

## 2024-04-20 ENCOUNTER — Inpatient Hospital Stay

## 2024-04-20 ENCOUNTER — Other Ambulatory Visit

## 2024-04-20 ENCOUNTER — Ambulatory Visit

## 2024-04-20 ENCOUNTER — Ambulatory Visit: Payer: Self-pay

## 2024-04-20 ENCOUNTER — Other Ambulatory Visit (HOSPITAL_COMMUNITY): Payer: Self-pay

## 2024-04-20 VITALS — BP 141/80 | HR 73 | Temp 98.0°F | Resp 18

## 2024-04-20 DIAGNOSIS — C61 Malignant neoplasm of prostate: Secondary | ICD-10-CM

## 2024-04-20 DIAGNOSIS — E876 Hypokalemia: Secondary | ICD-10-CM

## 2024-04-20 DIAGNOSIS — Z5111 Encounter for antineoplastic chemotherapy: Secondary | ICD-10-CM | POA: Diagnosis not present

## 2024-04-20 LAB — BASIC METABOLIC PANEL - CANCER CENTER ONLY
Anion gap: 8 (ref 5–15)
BUN: 21 mg/dL (ref 8–23)
CO2: 34 mmol/L — ABNORMAL HIGH (ref 22–32)
Calcium: 9.2 mg/dL (ref 8.9–10.3)
Chloride: 102 mmol/L (ref 98–111)
Creatinine: 1.04 mg/dL (ref 0.61–1.24)
GFR, Estimated: >60 mL/min (ref >60)
Glucose, Bld: 96 mg/dL (ref 70–99)
Potassium: 3.2 mmol/L — ABNORMAL LOW (ref 3.5–5.1)
Sodium: 144 mmol/L (ref 135–145)

## 2024-04-20 LAB — MAGNESIUM: Magnesium: 2 mg/dL (ref 1.7–2.4)

## 2024-04-20 MED ORDER — LEUPROLIDE ACETATE (3 MONTH) 22.5 MG ~~LOC~~ KIT
22.5000 mg | PACK | Freq: Once | SUBCUTANEOUS | Status: AC
  Administered 2024-04-20: 22.5 mg via SUBCUTANEOUS
  Filled 2024-04-20: qty 22.5

## 2024-04-20 MED ORDER — POTASSIUM CHLORIDE CRYS ER 20 MEQ PO TBCR
20.0000 meq | EXTENDED_RELEASE_TABLET | Freq: Every day | ORAL | 0 refills | Status: DC
  Filled 2024-04-20: qty 30, 30d supply, fill #0

## 2024-04-20 NOTE — Progress Notes (Signed)
 Contacted pt per Dr Tina: to let pt know potassium improved. Prescription for  Potassium 20 mEQ daily sent to pharmacy. Pt to take 1 daily. Pt acknowledged information and verbalized understanding.

## 2024-04-21 ENCOUNTER — Other Ambulatory Visit (HOSPITAL_COMMUNITY): Payer: Self-pay

## 2024-05-01 ENCOUNTER — Encounter (INDEPENDENT_AMBULATORY_CARE_PROVIDER_SITE_OTHER): Payer: Self-pay | Admitting: Physician Assistant

## 2024-05-04 ENCOUNTER — Telehealth: Payer: Self-pay

## 2024-05-04 NOTE — Telephone Encounter (Signed)
 Contacted pt regarding MyChart message and phone message pt left. Discussed pt's issue with constipation. Pt had taken Miralax yesterday but still having issues with constipation and gas. Encouraged pt to try warm drinks and walking if he is able for the gas but also discussed options of Senna and Mag Citrate. Pt acknowledged information and verbalized understanding.

## 2024-05-05 ENCOUNTER — Ambulatory Visit: Payer: Self-pay | Admitting: *Deleted

## 2024-05-05 ENCOUNTER — Other Ambulatory Visit (HOSPITAL_COMMUNITY): Payer: Self-pay

## 2024-05-05 NOTE — Telephone Encounter (Signed)
  FYI Only or Action Required?: FYI only for provider.  Patient was last seen in primary care on 01/21/2024 by Gretta Comer POUR, NP.  Called Nurse Triage reporting Flank Pain.  Symptoms began yesterday.  Interventions attempted: OTC medications: tylenol  no relief.  Symptoms are: gradually worsening.  Triage Disposition: See Physician Within 24 Hours  Patient/caregiver understands and will follow disposition?: Yes                Copied from CRM [id number]. Topic: Clinical - Red Word Triage >> May 05, 2024  2:10 PM Mia F wrote: Red Word that prompted transfer to Nurse Triage: Developed a kidney stone. Pain is about a 5-6. Taken Tylenol  with no releif. Pain is in right flank and lower abdomen. Reason for Disposition  MODERATE pain (e.g., interferes with normal activities or awakens from sleep)  Answer Assessment - Initial Assessment Questions Recommended UC no appt with PCP or other providers today .     1. LOCATION: Where does it hurt? (e.g., left, right)     Right flank pain  2. ONSET: When did the pain start?     Yesterday  3. SEVERITY: How bad is the pain? (e.g., Scale 1-10; mild, moderate, or severe)     6-7 /10 4. PATTERN: Does the pain come and go, or is it constant?      Comes and goes  5. CAUSE: What do you think is causing the pain?     Kidney stones  6. OTHER SYMPTOMS:  Do you have any other symptoms? (e.g., fever, abdomen pain, vomiting, leg weakness, burning with urination, blood in urine)     Lower abdominal pain , right flank pain urine flow starts and stops at times. Kidney stones in the past x 10 7. PREGNANCY:  Is there any chance you are pregnant? When was your last menstrual period?     na  Protocols used: Flank Pain-A-AH

## 2024-05-07 NOTE — Telephone Encounter (Signed)
 Noted

## 2024-05-10 ENCOUNTER — Other Ambulatory Visit: Payer: Self-pay

## 2024-05-10 NOTE — Progress Notes (Signed)
 [facility] OFFICE PROGRESS NOTE  Patient Care Team: Gretta Comer POUR, NP as PCP - General (Internal Medicine) Darliss Rogue, Thomas as PCP - Cardiology (Cardiology) Vertell Pont, RN as Oncology Nurse Navigator Charmayne Molly, Thomas as Consulting Physician (Ophthalmology)  Preston Thomas is a 73 y.o.male with history of HTN, HLD, DM, OSA being [facility] prostate cancer.    Current diagnosis: M1 HSPC Initial diagnosis: cT1cN0M0 PSA 12.7, GG5 (GRADES 4 + 5)  Germline testing: Negative genetic testing on the CancerNext-Expanded+RNAinsight panel. 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    Somatic testing: not yet Treatment: initial treatment: 05/2019 IMRT + brachytherapy + ADT. Received final leuprolide  injection 02/10/2021  Recurrence 08/2023 PET: Three very small lymph nodes at the level of the aortic bifurcation with intense radiotracer activity most consistent with prostate cancer nodal metastasis. One small node in the LEFT perirectal fat at 5 o'clock location in relation to the rectum is indeterminate but suspicious. 09/15/23 started AAP. Pre-AAP PSA 0.9.   Repeat PET showed response to treatment with residual lymph nodes now s/p radiation on 6/20. If PSA remain <0.1 will repeat PET six months from radiation. Assessment & Plan Hormone sensitive prostate cancer (HCC) Continue AAP. Continue max CV risk factor modification Continue Lupron . Last ADT with Lupron  on 7/24  Schedule for Lupron  for Oct  Hypokalemia Will increase potassium chloride  to 20 mEq twice daily x 3 days, then once daily Repeat next week. Secondary hypertension Currently controlled. Continue hydrochlorothiazide  12.5 mg daily.  CMP reviewed from march 2025.  Orders Placed This Encounter  Procedures   Basic Metabolic Panel - Cancer Center Only    Standing Status:   Future    Expiration Date:   05/11/2025     Preston Thomas  INTERVAL HISTORY: Patient returns for follow-up. Going for  bowel movement about once daily for a few weeks now. No watery diarrhea. No stomach pain, nausea, vomiting. No trouble urinating. Had a kidney stone on 06/03/24 and passed on 06/05/24 after drinking a lot of water. He is taking flomax .  Oncology History  Hormone sensitive prostate cancer (HCC)  11/25/2018 Pathology Results   Prostate biopsy diagnosis:  A. BENIGN PROSTATE TISSUE. NO EVIDENCE OF MALIGNANCY.   B.  PROSTATIC ADENOCARCINOMA. GLEASON'S SCORE 6 (GRADES 3 + 3) NOTED IN 1  OUT OF 1 SUBMITTED PROSTATE CORE SEGMENTS. APPROXIMATELY 60% OF SUBMITTED  TISSUE INVOLVED.. (GRADE GROUP 1) PERINEURAL INVASION IS PRESENT.   C.  PROSTATIC ADENOCARCINOMA. GLEASON'S SCORE 6 (GRADES 3 + 3) NOTED IN 1  OUT OF 1 SUBMITTED PROSTATE CORE SEGMENTS. APPROXIMATELY 45% OF SUBMITTED  TISSUE INVOLVED.. (GRADE GROUP 1)   D.  BENIGN CONNECTIVE TISSUE AND SMOOTH MUSCLE. NO PROSTATE TISSUE SEEN.   E.  PROSTATIC ADENOCARCINOMA. GLEASON'S SCORE 8 (GRADES 3 + 5) NOTED IN 1  OUT OF 1 SUBMITTED PROSTATE CORE SEGMENTS. APPROXIMATELY 91% OF SUBMITTED  TISSUE INVOLVED. (GRADE GROUP 4) PERINEURAL INVASION IS PRESENT.   F.  PROSTATIC ADENOCARCINOMA. GLEASON'S SCORE 7 (GRADES 3 + 4) NOTED IN 1  OUT OF 1 SUBMITTED PROSTATE CORE SEGMENTS. APPROXIMATELY 85% OF SUBMITTED  TISSUE INVOLVED.SABRA GLEASON GRADE 4 COMPRISES 30% OF THE TUMOR. (GRADE GROUP  2) PERINEURAL INVASION IS PRESENT.   G.  PROSTATIC ADENOCARCINOMA. GLEASON'S SCORE 6 (GRADES 3 + 3) NOTED IN 1  OUT OF 2 SUBMITTED PROSTATE CORE SEGMENTS. APPROXIMATELY 9% OF SUBMITTED  TISSUE INVOLVED.. (GRADE GROUP 1)   H.  PROSTATIC ADENOCARCINOMA. GLEASON'S SCORE 6 (GRADES 3 +  3) NOTED IN 1  OUT OF 1 SUBMITTED PROSTATE CORE SEGMENTS. APPROXIMATELY 13% OF SUBMITTED  TISSUE INVOLVED.. (GRADE GROUP 1) PERINEURAL INVASION IS PRESENT.   I.  PROSTATIC ADENOCARCINOMA. GLEASON'S SCORE 7 (GRADES 4 + 3) NOTED IN 2  OUT OF 2 SUBMITTED PROSTATE CORE SEGMENTS. APPROXIMATELY 27% OF  SUBMITTED  TISSUE INVOLVED.. (GRADE GROUP 3) GLEASON GRADE 4 COMPRISES 70% OF THE  TUMOR.   J. PROSTATIC ADENOCARCINOMA. GLEASON'S SCORE 9 (GRADES 4 + 5) NOTED IN 1  OUT OF 2 SUBMITTED PROSTATE CORE SEGMENTS. APPROXIMATELY 73% OF SUBMITTED  TISSUE INVOLVED.. (GRADE GROUP 5) PERINEURAL INVASION IS PRESENT.   K.  PROSTATIC ADENOCARCINOMA. GLEASON'S SCORE 8 (GRADES 3 + 5) NOTED IN 1  OUT OF 1 SUBMITTED PROSTATE CORE SEGMENTS. APPROXIMATELY 100% OF SUBMITTED  TISSUE INVOLVED.. (GRADE GROUP 4) PERINEURAL INVASION IS PRESENT.   L.  PROSTATIC ADENOCARCINOMA. GLEASON'S SCORE 7 (GRADES 4 + 3) NOTED IN 1  OUT OF 1 SUBMITTED PROSTATE CORE SEGMENTS. APPROXIMATELY 66% OF SUBMITTED  TISSUE INVOLVED.. (GRADE GROUP 3) GLEASON GRADE 4 COMPRISES 60% OF THE  TUMOR.    08/19/2023 PET scan   PSMA PET IMPRESSION: 1. No evidence of recurrence in the prostate gland. 2. Three very small lymph nodes at the level of the aortic bifurcation with intense radiotracer activity most consistent with prostate cancer nodal metastasis. 3. One small node in the LEFT perirectal fat at 5 o'clock location in relation to teh rectum is indeterminate but suspicious. 4. No more distant metastatic nodal disease. No visceral metastasis or skeletal metastasis.   09/07/2023 Initial Diagnosis   Hormone sensitive prostate cancer (HCC)    Tumor Marker   PSA 08/18/18 11.54 11/11/18 12.7 10/23/19 0.29 02/19/20 0.3 04/22/20 0.15 09/02/20 0.11 04/23/21 0.04 11/28/21 <0.1 04/24/22 0.11 11/30/22 0.4 06/16/23 0.7 08/06/23 0.9   10/12/2023 Cancer Staging   Staging form: Prostate, AJCC 8th Edition - Clinical: Stage IVB (cTX, cN1, cM1a, Grade Group: 5) - Signed by Preston Preston BROCKS, Thomas on 10/12/2023 Histologic grading system: 5 grade system   02/07/2024 Genetic Testing   Negative genetic testing on the CancerNext-Expanded+RNAinsight panel.  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  The report date is Feb 07, 2024.  The CancerNext-Expanded gene  panel offered by [facility] and includes sequencing, rearrangement, and RNA analysis for the following 77 genes: AIP, ALK, APC, ATM, BAP1, BARD1, BMPR1A, BRCA1, BRCA2, BRIP1, CDC73, CDH1, CDK4, CDKN1B, CDKN2A, CEBPA, CHEK2, CTNNA1, DDX41, DICER1, ETV6, FH, FLCN, GATA2, LZTR1, MAX, MBD4, MEN1, MET, MLH1, MSH2, MSH3, MSH6, MUTYH, NF1, NF2, NTHL1, PALB2, PHOX2B, PMS2, POT1, PRKAR1A, PTCH1, PTEN, RAD51C, RAD51D, RB1, RET, RPS20, RUNX1, SDHA, SDHAF2, SDHB, SDHC, SDHD, SMAD4, SMARCA4, SMARCB1, SMARCE1, STK11, SUFU, TMEM127, TP53, TSC1, TSC2, VHL, and WT1 (sequencing and deletion/duplication); AXIN2, CTNNA1, DDX41, EGFR, HOXB13, KIT, MBD4, MITF, MSH3, PDGFRA, POLD1 and POLE (sequencing only); EPCAM and GREM1 (deletion/duplication only). RNA data is routinely analyzed for use in variant interpretation for all genes.      PHYSICAL EXAMINATION: ECOG PERFORMANCE STATUS: 0 - Asymptomatic  Vitals:   05/11/24 1419  BP: 116/67  Pulse: (!) 103  Resp: 20  Temp: (!) 97.3 F (36.3 C)  SpO2: 96%   Filed Weights   05/11/24 1419  Weight: 198 lb 4.8 oz (89.9 kg)   GENERAL: alert, no distress and comfortable SKIN: skin color normal  LUNGS: clear to auscultation and percussion with normal breathing effort HEART: regular rate & rhythm  ABDOMEN: abdomen soft, non-tender and nondistended. Musculoskeletal: no edema  Relevant data reviewed  during this visit included labs. New lab ordered.

## 2024-05-10 NOTE — Progress Notes (Signed)
 Specialty Pharmacy Ongoing Clinical Assessment Note  Preston Thomas is a 73 y.o. male who is being followed by the specialty pharmacy service for RxSp Oncology   Patient's specialty medication(s) reviewed today: Abiraterone  Acetate (ZYTIGA )   Missed doses in the last 4 weeks: 0   Patient/Caregiver did not have any additional questions or concerns.   Therapeutic benefit summary: Patient is achieving benefit   Adverse events/side effects summary: No adverse events/side effects   Patient's therapy is appropriate to: Continue    Goals Addressed             This Visit's Progress    Maintain optimal adherence to therapy   On track    Patient is on track. Patient will maintain adherence          Follow up: 3 months  [facility] Specialty Pharmacist

## 2024-05-10 NOTE — Progress Notes (Signed)
 Specialty Pharmacy Refill Coordination Note  Preston Thomas is a 73 y.o. male contacted today regarding refills of specialty medication(s) Abiraterone  Acetate (ZYTIGA )   Patient requested Delivery   Delivery date: 05/12/24   Verified address: [address] [place] [address]   Medication will be filled on 05/11/24.

## 2024-05-10 NOTE — Assessment & Plan Note (Addendum)
 Continue AAP. Continue max CV risk factor modification Continue Lupron . Last ADT with Lupron  on 7/24  Schedule for Lupron  for Oct

## 2024-05-11 ENCOUNTER — Other Ambulatory Visit: Payer: Self-pay

## 2024-05-11 ENCOUNTER — Inpatient Hospital Stay

## 2024-05-11 VITALS — BP 116/67 | HR 103 | Temp 97.3°F | Resp 20 | Wt 198.3 lb

## 2024-05-11 DIAGNOSIS — C775 Secondary and unspecified malignant neoplasm of intrapelvic lymph nodes: Secondary | ICD-10-CM

## 2024-05-11 DIAGNOSIS — E876 Hypokalemia: Secondary | ICD-10-CM | POA: Insufficient documentation

## 2024-05-11 DIAGNOSIS — C61 Malignant neoplasm of prostate: Secondary | ICD-10-CM | POA: Insufficient documentation

## 2024-05-11 DIAGNOSIS — Z79899 Other long term (current) drug therapy: Secondary | ICD-10-CM | POA: Insufficient documentation

## 2024-05-11 DIAGNOSIS — I159 Secondary hypertension, unspecified: Secondary | ICD-10-CM | POA: Insufficient documentation

## 2024-05-11 DIAGNOSIS — Z191 Hormone sensitive malignancy status: Secondary | ICD-10-CM

## 2024-05-11 DIAGNOSIS — Z87891 Personal history of nicotine dependence: Secondary | ICD-10-CM | POA: Diagnosis not present

## 2024-05-11 DIAGNOSIS — C779 Secondary and unspecified malignant neoplasm of lymph node, unspecified: Secondary | ICD-10-CM | POA: Diagnosis not present

## 2024-05-11 LAB — CMP (CANCER CENTER ONLY)
ALT: 30 U/L (ref 0–44)
AST: 23 U/L (ref 15–41)
Albumin: 4 g/dL (ref 3.5–5.0)
Alkaline Phosphatase: 72 U/L (ref 38–126)
Anion gap: 9 (ref 5–15)
BUN: 21 mg/dL (ref 8–23)
CO2: 36 mmol/L — ABNORMAL HIGH (ref 22–32)
Calcium: 9.3 mg/dL (ref 8.9–10.3)
Chloride: 100 mmol/L (ref 98–111)
Creatinine: 1.03 mg/dL (ref 0.61–1.24)
GFR, Estimated: >60 mL/min (ref >60)
Glucose, Bld: 121 mg/dL — ABNORMAL HIGH (ref 70–99)
Potassium: 2.8 mmol/L — ABNORMAL LOW (ref 3.5–5.1)
Sodium: 145 mmol/L (ref 135–145)
Total Bilirubin: 0.5 mg/dL (ref 0.0–1.2)
Total Protein: 7 g/dL (ref 6.5–8.1)

## 2024-05-11 LAB — CBC WITH DIFFERENTIAL (CANCER CENTER ONLY)
Abs Immature Granulocytes: 0.08 K/uL — ABNORMAL HIGH (ref 0.00–0.07)
Basophils Absolute: 0.1 K/uL (ref 0.0–0.1)
Basophils Relative: 1 %
Eosinophils Absolute: 0.2 K/uL (ref 0.0–0.5)
Eosinophils Relative: 2 %
HCT: 38.3 % — ABNORMAL LOW (ref 39.0–52.0)
Hemoglobin: 13.2 g/dL (ref 13.0–17.0)
Immature Granulocytes: 1 %
Lymphocytes Relative: 10 %
Lymphs Abs: 1.1 K/uL (ref 0.7–4.0)
MCH: 30.9 pg (ref 26.0–34.0)
MCHC: 34.5 g/dL (ref 30.0–36.0)
MCV: 89.7 fL (ref 80.0–100.0)
Monocytes Absolute: 0.7 K/uL (ref 0.1–1.0)
Monocytes Relative: 7 %
Neutro Abs: 8.1 K/uL — ABNORMAL HIGH (ref 1.7–7.7)
Neutrophils Relative %: 79 %
Platelet Count: 370 K/uL (ref 150–400)
RBC: 4.27 MIL/uL (ref 4.22–5.81)
RDW: 14.1 % (ref 11.5–15.5)
WBC Count: 10.3 K/uL (ref 4.0–10.5)
nRBC: 0 % (ref 0.0–0.2)

## 2024-05-11 MED ORDER — POTASSIUM CHLORIDE CRYS ER 10 MEQ PO TBCR
20.0000 meq | EXTENDED_RELEASE_TABLET | Freq: Two times a day (BID) | ORAL | 0 refills | Status: DC
  Filled 2024-05-11: qty 60, 15d supply, fill #0

## 2024-05-11 NOTE — Assessment & Plan Note (Addendum)
 Currently controlled. Continue hydrochlorothiazide  12.5 mg daily.  CMP reviewed from march 2025.

## 2024-05-11 NOTE — Assessment & Plan Note (Signed)
 Will increase potassium chloride  to 20 mEq twice daily x 3 days, then once daily Repeat next week.

## 2024-05-18 ENCOUNTER — Other Ambulatory Visit

## 2024-05-18 DIAGNOSIS — C779 Secondary and unspecified malignant neoplasm of lymph node, unspecified: Secondary | ICD-10-CM | POA: Diagnosis not present

## 2024-05-18 DIAGNOSIS — E876 Hypokalemia: Secondary | ICD-10-CM

## 2024-05-18 LAB — BASIC METABOLIC PANEL - CANCER CENTER ONLY
Anion gap: 6 (ref 5–15)
BUN: 12 mg/dL (ref 8–23)
CO2: 26 mmol/L (ref 22–32)
Calcium: 8.7 mg/dL — ABNORMAL LOW (ref 8.9–10.3)
Chloride: 113 mmol/L — ABNORMAL HIGH (ref 98–111)
Creatinine: 0.82 mg/dL (ref 0.61–1.24)
GFR, Estimated: >60 mL/min
Glucose, Bld: 116 mg/dL — ABNORMAL HIGH (ref 70–99)
Potassium: 4.3 mmol/L (ref 3.5–5.1)
Sodium: 145 mmol/L (ref 135–145)

## 2024-05-19 ENCOUNTER — Ambulatory Visit: Payer: Self-pay

## 2024-05-19 DIAGNOSIS — C61 Malignant neoplasm of prostate: Secondary | ICD-10-CM

## 2024-06-02 ENCOUNTER — Other Ambulatory Visit (HOSPITAL_COMMUNITY): Payer: Self-pay

## 2024-06-07 ENCOUNTER — Ambulatory Visit

## 2024-06-07 DIAGNOSIS — M810 Age-related osteoporosis without current pathological fracture: Secondary | ICD-10-CM | POA: Diagnosis not present

## 2024-06-07 DIAGNOSIS — Z191 Hormone sensitive malignancy status: Secondary | ICD-10-CM

## 2024-06-07 DIAGNOSIS — C61 Malignant neoplasm of prostate: Secondary | ICD-10-CM | POA: Diagnosis not present

## 2024-06-08 ENCOUNTER — Inpatient Hospital Stay (HOSPITAL_BASED_OUTPATIENT_CLINIC_OR_DEPARTMENT_OTHER)

## 2024-06-08 ENCOUNTER — Other Ambulatory Visit (HOSPITAL_COMMUNITY): Payer: Self-pay

## 2024-06-08 ENCOUNTER — Inpatient Hospital Stay

## 2024-06-08 ENCOUNTER — Other Ambulatory Visit: Payer: Self-pay

## 2024-06-08 VITALS — BP 134/66 | HR 79 | Temp 97.3°F | Resp 20 | Wt 208.2 lb

## 2024-06-08 DIAGNOSIS — E876 Hypokalemia: Secondary | ICD-10-CM | POA: Diagnosis not present

## 2024-06-08 DIAGNOSIS — C779 Secondary and unspecified malignant neoplasm of lymph node, unspecified: Secondary | ICD-10-CM | POA: Insufficient documentation

## 2024-06-08 DIAGNOSIS — C61 Malignant neoplasm of prostate: Secondary | ICD-10-CM | POA: Diagnosis not present

## 2024-06-08 DIAGNOSIS — D649 Anemia, unspecified: Secondary | ICD-10-CM | POA: Insufficient documentation

## 2024-06-08 DIAGNOSIS — E291 Testicular hypofunction: Secondary | ICD-10-CM | POA: Diagnosis not present

## 2024-06-08 DIAGNOSIS — M816 Localized osteoporosis [Lequesne]: Secondary | ICD-10-CM | POA: Diagnosis not present

## 2024-06-08 DIAGNOSIS — Z191 Hormone sensitive malignancy status: Secondary | ICD-10-CM

## 2024-06-08 DIAGNOSIS — M810 Age-related osteoporosis without current pathological fracture: Secondary | ICD-10-CM | POA: Insufficient documentation

## 2024-06-08 DIAGNOSIS — Z79899 Other long term (current) drug therapy: Secondary | ICD-10-CM | POA: Diagnosis not present

## 2024-06-08 LAB — CMP (CANCER CENTER ONLY)
ALT: 15 U/L (ref 0–44)
AST: 11 U/L — ABNORMAL LOW (ref 15–41)
Albumin: 3.9 g/dL (ref 3.5–5.0)
Alkaline Phosphatase: 68 U/L (ref 38–126)
Anion gap: 6 (ref 5–15)
BUN: 17 mg/dL (ref 8–23)
CO2: 31 mmol/L (ref 22–32)
Calcium: 8.9 mg/dL (ref 8.9–10.3)
Chloride: 108 mmol/L (ref 98–111)
Creatinine: 1.18 mg/dL (ref 0.61–1.24)
GFR, Estimated: >60 mL/min (ref >60)
Glucose, Bld: 103 mg/dL — ABNORMAL HIGH (ref 70–99)
Potassium: 3.8 mmol/L (ref 3.5–5.1)
Sodium: 145 mmol/L (ref 135–145)
Total Bilirubin: 0.4 mg/dL (ref 0.0–1.2)
Total Protein: 6.7 g/dL (ref 6.5–8.1)

## 2024-06-08 LAB — CBC WITH DIFFERENTIAL (CANCER CENTER ONLY)
Abs Immature Granulocytes: 0.06 K/uL (ref 0.00–0.07)
Basophils Absolute: 0.1 K/uL (ref 0.0–0.1)
Basophils Relative: 1 %
Eosinophils Absolute: 0.3 K/uL (ref 0.0–0.5)
Eosinophils Relative: 3 %
HCT: 37.1 % — ABNORMAL LOW (ref 39.0–52.0)
Hemoglobin: 12.4 g/dL — ABNORMAL LOW (ref 13.0–17.0)
Immature Granulocytes: 1 %
Lymphocytes Relative: 12 %
Lymphs Abs: 1 K/uL (ref 0.7–4.0)
MCH: 30.8 pg (ref 26.0–34.0)
MCHC: 33.4 g/dL (ref 30.0–36.0)
MCV: 92.1 fL (ref 80.0–100.0)
Monocytes Absolute: 0.8 K/uL (ref 0.1–1.0)
Monocytes Relative: 9 %
Neutro Abs: 6.2 K/uL (ref 1.7–7.7)
Neutrophils Relative %: 74 %
Platelet Count: 308 K/uL (ref 150–400)
RBC: 4.03 MIL/uL — ABNORMAL LOW (ref 4.22–5.81)
RDW: 13.2 % (ref 11.5–15.5)
WBC Count: 8.3 K/uL (ref 4.0–10.5)
nRBC: 0 % (ref 0.0–0.2)

## 2024-06-08 MED ORDER — POTASSIUM CHLORIDE CRYS ER 10 MEQ PO TBCR
20.0000 meq | EXTENDED_RELEASE_TABLET | Freq: Every day | ORAL | 2 refills | Status: DC
  Filled 2024-06-08: qty 120, 60d supply, fill #0

## 2024-06-08 MED ORDER — RELUGOLIX 120 MG PO TABS
ORAL_TABLET | ORAL | 0 refills | Status: DC
  Filled 2024-06-08: qty 30, fill #0

## 2024-06-08 NOTE — Assessment & Plan Note (Addendum)
 Continue AAP. Continue max CV risk factor modification Will change Lupron  to Orgovyx . Last ADT with Lupron  on 7/24  Return in end of Oct with labs.

## 2024-06-08 NOTE — Assessment & Plan Note (Addendum)
 Continue potassium chloride  to 20 mEq daily Refilled today.

## 2024-06-08 NOTE — Assessment & Plan Note (Addendum)
 Will add ferritin, folate and B12 at next draw

## 2024-06-08 NOTE — Progress Notes (Signed)
 [facility] OFFICE PROGRESS NOTE  Patient Care Team: Gretta Comer POUR, NP as PCP - General (Internal Medicine) Darliss Rogue, MD as PCP - Cardiology (Cardiology) Vertell Pont, RN as Oncology Nurse Navigator Charmayne Molly, MD as Consulting Physician (Ophthalmology)  Preston Thomas is a 73 y.o.male with history of HTN, HLD, DM, OSA being seen at [facility]cology [facility] for prostate cancer.    Current diagnosis: M1 HSPC Initial diagnosis: cT1cN0M0 PSA 12.7, GG5 (GRADES 4 + 5)  Germline testing: Negative genetic testing on the CancerNext-Expanded+RNAinsight panel. 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    Somatic testing: not yet Treatment: initial treatment: 05/2019 IMRT + brachytherapy + ADT. Received final leuprolide  injection 02/10/2021  Recurrence 08/2023 PET: Three very small lymph nodes at the level of the aortic bifurcation with intense radiotracer activity most consistent with prostate cancer nodal metastasis. One small node in the LEFT perirectal fat at 5 o'clock location in relation to the rectum is indeterminate but suspicious. 09/15/23 started AAP. Pre-AAP PSA 0.9.   03/06/24-03/17/24 radiation to pelvic nodal metastases  Patient has more fatigue after radiation.  Clinically not completely resolved.  He is trying to exercise but still has shortness of breath and dyspnea.  Will try to change ADT to Orgovyx .  His DEXA scan showed osteoporosis at the right femoral neck.  Discussed starting bisphosphonate.  He has upcoming dental appointment next month.  He has no dental pain or upcoming procedures.  Will plan on end of October if there is no dental concerns. Assessment & Plan Hormone sensitive prostate cancer (HCC) Continue AAP. Continue max CV risk factor modification Will change Lupron  to Orgovyx . Last ADT with Lupron  on 7/24  Return in end of Oct with labs. Hypokalemia Continue potassium chloride  to 20 mEq daily Refilled today. Localized osteoporosis without current  pathological fracture Right femoral neck -2.7.   History of ADT and prostate cancer Will start Zometa 4 mg every 6 months after his dental appointment next month about 10/30. Normocytic anemia Will add ferritin, folate and B12 at next draw    Preston JAYSON Chihuahua, MD  INTERVAL HISTORY: Patient returns for follow-up.  Overall he is feeling well.  He is recovering from radiation.  Area has mostly resolved.  Bowel movements normal.  He is taking potassium 20 millequivalents daily.  No active chest pain or shortness of breath.  Still some dyspnea when started to exercise.  Oncology History  Hormone sensitive prostate cancer (HCC)  11/25/2018 Pathology Results   Prostate biopsy diagnosis:  A. BENIGN PROSTATE TISSUE. NO EVIDENCE OF MALIGNANCY.   B.  PROSTATIC ADENOCARCINOMA. GLEASON'S SCORE 6 (GRADES 3 + 3) NOTED IN 1  OUT OF 1 SUBMITTED PROSTATE CORE SEGMENTS. APPROXIMATELY 60% OF SUBMITTED  TISSUE INVOLVED.. (GRADE GROUP 1) PERINEURAL INVASION IS PRESENT.   C.  PROSTATIC ADENOCARCINOMA. GLEASON'S SCORE 6 (GRADES 3 + 3) NOTED IN 1  OUT OF 1 SUBMITTED PROSTATE CORE SEGMENTS. APPROXIMATELY 45% OF SUBMITTED  TISSUE INVOLVED.. (GRADE GROUP 1)   D.  BENIGN CONNECTIVE TISSUE AND SMOOTH MUSCLE. NO PROSTATE TISSUE SEEN.   E.  PROSTATIC ADENOCARCINOMA. GLEASON'S SCORE 8 (GRADES 3 + 5) NOTED IN 1  OUT OF 1 SUBMITTED PROSTATE CORE SEGMENTS. APPROXIMATELY 91% OF SUBMITTED  TISSUE INVOLVED. (GRADE GROUP 4) PERINEURAL INVASION IS PRESENT.   F.  PROSTATIC ADENOCARCINOMA. GLEASON'S SCORE 7 (GRADES 3 + 4) NOTED IN 1  OUT OF 1 SUBMITTED PROSTATE CORE SEGMENTS. APPROXIMATELY 85% OF SUBMITTED  TISSUE INVOLVED.SABRA GLEASON GRADE 4 COMPRISES 30% OF THE TUMOR. (  GRADE GROUP  2) PERINEURAL INVASION IS PRESENT.   G.  PROSTATIC ADENOCARCINOMA. GLEASON'S SCORE 6 (GRADES 3 + 3) NOTED IN 1  OUT OF 2 SUBMITTED PROSTATE CORE SEGMENTS. APPROXIMATELY 9% OF SUBMITTED  TISSUE INVOLVED.. (GRADE GROUP 1)   H.  PROSTATIC  ADENOCARCINOMA. GLEASON'S SCORE 6 (GRADES 3 + 3) NOTED IN 1  OUT OF 1 SUBMITTED PROSTATE CORE SEGMENTS. APPROXIMATELY 13% OF SUBMITTED  TISSUE INVOLVED.. (GRADE GROUP 1) PERINEURAL INVASION IS PRESENT.   I.  PROSTATIC ADENOCARCINOMA. GLEASON'S SCORE 7 (GRADES 4 + 3) NOTED IN 2  OUT OF 2 SUBMITTED PROSTATE CORE SEGMENTS. APPROXIMATELY 27% OF SUBMITTED  TISSUE INVOLVED.. (GRADE GROUP 3) GLEASON GRADE 4 COMPRISES 70% OF THE  TUMOR.   J. PROSTATIC ADENOCARCINOMA. GLEASON'S SCORE 9 (GRADES 4 + 5) NOTED IN 1  OUT OF 2 SUBMITTED PROSTATE CORE SEGMENTS. APPROXIMATELY 73% OF SUBMITTED  TISSUE INVOLVED.. (GRADE GROUP 5) PERINEURAL INVASION IS PRESENT.   K.  PROSTATIC ADENOCARCINOMA. GLEASON'S SCORE 8 (GRADES 3 + 5) NOTED IN 1  OUT OF 1 SUBMITTED PROSTATE CORE SEGMENTS. APPROXIMATELY 100% OF SUBMITTED  TISSUE INVOLVED.. (GRADE GROUP 4) PERINEURAL INVASION IS PRESENT.   L.  PROSTATIC ADENOCARCINOMA. GLEASON'S SCORE 7 (GRADES 4 + 3) NOTED IN 1  OUT OF 1 SUBMITTED PROSTATE CORE SEGMENTS. APPROXIMATELY 66% OF SUBMITTED  TISSUE INVOLVED.. (GRADE GROUP 3) GLEASON GRADE 4 COMPRISES 60% OF THE  TUMOR.    08/19/2023 PET scan   PSMA PET IMPRESSION: 1. No evidence of recurrence in the prostate gland. 2. Three very small lymph nodes at the level of the aortic bifurcation with intense radiotracer activity most consistent with prostate cancer nodal metastasis. 3. One small node in the LEFT perirectal fat at 5 o'clock location in relation to teh rectum is indeterminate but suspicious. 4. No more distant metastatic nodal disease. No visceral metastasis or skeletal metastasis.   09/07/2023 Initial Diagnosis   Hormone sensitive prostate cancer (HCC)    Tumor Marker   PSA 08/18/18 11.54 11/11/18 12.7 10/23/19 0.29 02/19/20 0.3 04/22/20 0.15 09/02/20 0.11 04/23/21 0.04 11/28/21 <0.1 04/24/22 0.11 11/30/22 0.4 06/16/23 0.7 08/06/23 0.9   10/12/2023 Cancer Staging   Staging form: Prostate, AJCC 8th Edition -  Clinical: Stage IVB (cTX, cN1, cM1a, Grade Group: 5) - Signed by Tina Preston BROCKS, MD on 10/12/2023 Histologic grading system: 5 grade system   02/07/2024 Genetic Testing   Negative genetic testing on the CancerNext-Expanded+RNAinsight panel.  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  The report date is Feb 07, 2024.  The CancerNext-Expanded gene panel offered by [facility] and includes sequencing, rearrangement, and RNA analysis for the following 77 genes: AIP, ALK, APC, ATM, BAP1, BARD1, BMPR1A, BRCA1, BRCA2, BRIP1, CDC73, CDH1, CDK4, CDKN1B, CDKN2A, CEBPA, CHEK2, CTNNA1, DDX41, DICER1, ETV6, FH, FLCN, GATA2, LZTR1, MAX, MBD4, MEN1, MET, MLH1, MSH2, MSH3, MSH6, MUTYH, NF1, NF2, NTHL1, PALB2, PHOX2B, PMS2, POT1, PRKAR1A, PTCH1, PTEN, RAD51C, RAD51D, RB1, RET, RPS20, RUNX1, SDHA, SDHAF2, SDHB, SDHC, SDHD, SMAD4, SMARCA4, SMARCB1, SMARCE1, STK11, SUFU, TMEM127, TP53, TSC1, TSC2, VHL, and WT1 (sequencing and deletion/duplication); AXIN2, CTNNA1, DDX41, EGFR, HOXB13, KIT, MBD4, MITF, MSH3, PDGFRA, POLD1 and POLE (sequencing only); EPCAM and GREM1 (deletion/duplication only). RNA data is routinely analyzed for use in variant interpretation for all genes.      PHYSICAL EXAMINATION: ECOG PERFORMANCE STATUS: 1 - Symptomatic but completely ambulatory  Vitals:   06/08/24 1548  BP: 134/66  Pulse: 79  Resp: 20  Temp: (!) 97.3 F (36.3 C)  SpO2: 96%  Filed Weights   06/08/24 1548  Weight: 208 lb 3.2 oz (94.4 kg)    GENERAL: alert, no distress and comfortable SKIN: skin color normal  NECK: No palpable mass LYMPH:  no palpable cervical lymphadenopathy  LUNGS: clear to auscultation and percussion with normal breathing effort HEART: regular rate & rhythm  ABDOMEN: abdomen soft, non-tender and nondistended. Musculoskeletal: Bilateral ankle edema   Relevant data reviewed during this visit included labs.

## 2024-06-08 NOTE — Assessment & Plan Note (Addendum)
 Right femoral neck -2.7.   History of ADT and prostate cancer Will start Zometa 4 mg every 6 months after his dental appointment next month about 10/30.

## 2024-06-09 ENCOUNTER — Other Ambulatory Visit: Payer: Self-pay

## 2024-06-09 ENCOUNTER — Telehealth: Payer: Self-pay

## 2024-06-09 ENCOUNTER — Other Ambulatory Visit (HOSPITAL_COMMUNITY): Payer: Self-pay

## 2024-06-09 ENCOUNTER — Other Ambulatory Visit: Payer: Self-pay | Admitting: Pharmacy Technician

## 2024-06-09 ENCOUNTER — Ambulatory Visit: Payer: Self-pay

## 2024-06-09 LAB — PROSTATE-SPECIFIC AG, SERUM (LABCORP): Prostate Specific Ag, Serum: <0.1 ng/mL (ref 0.0–4.0)

## 2024-06-09 LAB — TESTOSTERONE: Testosterone: <3 ng/dL — ABNORMAL LOW (ref 264–916)

## 2024-06-09 NOTE — Telephone Encounter (Signed)
 Oral Oncology Patient Advocate Encounter  Prior Authorization for Orgovyx  has been approved.    PA# [id number] Effective dates: 06/09/24 through 06/09/25  Patients co-pay is $0.00 with [employer].   Lucie Lamer, CPhT [place]  [facility] Pharmacy Technician Patient Advocate Specialist II [facility] [place] Phone: [phone]  Fax: [phone] [email or web address]

## 2024-06-09 NOTE — Progress Notes (Signed)
 Specialty Pharmacy Refill Coordination Note  Preston Thomas is a 73 y.o. male contacted today regarding refills of specialty medication(s) Abiraterone  Acetate (ZYTIGA )   Patient requested Delivery   Delivery date: 06/14/24   Verified address: [address] [place]   Medication will be filled on 06/13/24.

## 2024-06-09 NOTE — Telephone Encounter (Signed)
 Scheduled appointments per 9/11 los. Talked with the patient and he is aware of the made appointments.

## 2024-06-09 NOTE — Telephone Encounter (Signed)
 Oral Oncology Pharmacist Encounter  Received new prescription for Orgovyx  (relugolix ) for the treatment of metastatic hormone sensitive prostate cancer in conjunction with Zytiga  and prednisone , planned duration until disease progression or unacceptable toxicity.  Labs from 06/08/2024 assessed, no interventions needed. Prescription dose and frequency assessed for appropriateness.   Current medication list in Epic reviewed, no significant/ relevant DDIs with Orgovyx  identified.  Evaluated chart and no patient barriers to medication adherence noted.   Prescription has been e-scribed to the [facility] for benefits analysis and approval.  Oral Oncology Clinic will continue to follow for insurance authorization, copayment issues, initial counseling and start date.  Deaja Rizo, PharmD Hematology/Oncology Clinical Pharmacist [facility] Oral Chemotherapy Navigation [facility] [phone] 06/09/2024 8:20 AM

## 2024-06-09 NOTE — Telephone Encounter (Signed)
 Oral Oncology Patient Advocate Encounter   Received notification that prior authorization for relugolix  (ORGOVYX ) 120 MG tablet  is required.   PA submitted on 06/09/24 Key [id number] Status is pending     Lucie Lamer, CPhT [place]  [facility] Services Pharmacy Technician Patient Advocate Specialist II [facility] [place] Phone: [phone]  Fax: [phone] [email or web address]

## 2024-06-13 ENCOUNTER — Encounter (INDEPENDENT_AMBULATORY_CARE_PROVIDER_SITE_OTHER): Payer: Self-pay | Admitting: Physician Assistant

## 2024-06-13 ENCOUNTER — Other Ambulatory Visit: Payer: Self-pay

## 2024-06-15 ENCOUNTER — Other Ambulatory Visit: Payer: Self-pay

## 2024-06-22 ENCOUNTER — Other Ambulatory Visit: Payer: Self-pay

## 2024-06-22 ENCOUNTER — Encounter: Payer: Self-pay | Admitting: Urology

## 2024-06-22 DIAGNOSIS — C61 Malignant neoplasm of prostate: Secondary | ICD-10-CM

## 2024-07-02 ENCOUNTER — Other Ambulatory Visit (HOSPITAL_COMMUNITY): Payer: Self-pay

## 2024-07-02 ENCOUNTER — Other Ambulatory Visit: Payer: Self-pay | Admitting: Primary Care

## 2024-07-02 DIAGNOSIS — G43909 Migraine, unspecified, not intractable, without status migrainosus: Secondary | ICD-10-CM

## 2024-07-02 MED ORDER — SUMATRIPTAN SUCCINATE 100 MG PO TABS
ORAL_TABLET | ORAL | 0 refills | Status: AC
  Filled 2024-07-02: qty 9, 30d supply, fill #0

## 2024-07-03 ENCOUNTER — Other Ambulatory Visit (HOSPITAL_COMMUNITY): Payer: Self-pay

## 2024-07-03 ENCOUNTER — Other Ambulatory Visit: Payer: Self-pay

## 2024-07-06 ENCOUNTER — Encounter (INDEPENDENT_AMBULATORY_CARE_PROVIDER_SITE_OTHER): Payer: Self-pay

## 2024-07-06 ENCOUNTER — Other Ambulatory Visit: Payer: Self-pay

## 2024-07-07 ENCOUNTER — Other Ambulatory Visit: Payer: Self-pay

## 2024-07-07 NOTE — Progress Notes (Signed)
 Specialty Pharmacy Refill Coordination Note  Preston Thomas is a 73 y.o. male contacted today regarding refills of specialty medication(s) Zytiga   Patient requested (Patient-Rptd) Delivery   Delivery date: 07/11/24 Verified address: (Patient-Rptd) [address] [place], [place] [address]   Medication will be filled on 07/10/24.

## 2024-07-08 DIAGNOSIS — E876 Hypokalemia: Secondary | ICD-10-CM | POA: Diagnosis not present

## 2024-07-08 DIAGNOSIS — J9601 Acute respiratory failure with hypoxia: Secondary | ICD-10-CM | POA: Diagnosis not present

## 2024-07-08 DIAGNOSIS — R000 Tachycardia, unspecified: Secondary | ICD-10-CM | POA: Diagnosis not present

## 2024-07-08 DIAGNOSIS — I509 Heart failure, unspecified: Secondary | ICD-10-CM | POA: Diagnosis not present

## 2024-07-08 DIAGNOSIS — I10 Essential (primary) hypertension: Secondary | ICD-10-CM | POA: Diagnosis not present

## 2024-07-08 DIAGNOSIS — Z8546 Personal history of malignant neoplasm of prostate: Secondary | ICD-10-CM | POA: Diagnosis not present

## 2024-07-08 DIAGNOSIS — I700 Atherosclerosis of aorta: Secondary | ICD-10-CM | POA: Diagnosis not present

## 2024-07-08 DIAGNOSIS — R0600 Dyspnea, unspecified: Secondary | ICD-10-CM | POA: Diagnosis not present

## 2024-07-08 DIAGNOSIS — I5043 Acute on chronic combined systolic (congestive) and diastolic (congestive) heart failure: Secondary | ICD-10-CM | POA: Diagnosis not present

## 2024-07-08 DIAGNOSIS — R7989 Other specified abnormal findings of blood chemistry: Secondary | ICD-10-CM | POA: Diagnosis not present

## 2024-07-08 DIAGNOSIS — G4733 Obstructive sleep apnea (adult) (pediatric): Secondary | ICD-10-CM | POA: Diagnosis not present

## 2024-07-08 DIAGNOSIS — C61 Malignant neoplasm of prostate: Secondary | ICD-10-CM | POA: Diagnosis not present

## 2024-07-08 DIAGNOSIS — I5020 Unspecified systolic (congestive) heart failure: Secondary | ICD-10-CM | POA: Diagnosis not present

## 2024-07-08 DIAGNOSIS — I471 Supraventricular tachycardia, unspecified: Secondary | ICD-10-CM | POA: Diagnosis not present

## 2024-07-08 DIAGNOSIS — J189 Pneumonia, unspecified organism: Secondary | ICD-10-CM | POA: Diagnosis not present

## 2024-07-08 DIAGNOSIS — I110 Hypertensive heart disease with heart failure: Secondary | ICD-10-CM | POA: Diagnosis not present

## 2024-07-08 DIAGNOSIS — E669 Obesity, unspecified: Secondary | ICD-10-CM | POA: Diagnosis not present

## 2024-07-08 DIAGNOSIS — R0902 Hypoxemia: Secondary | ICD-10-CM | POA: Diagnosis not present

## 2024-07-08 DIAGNOSIS — R918 Other nonspecific abnormal finding of lung field: Secondary | ICD-10-CM | POA: Diagnosis not present

## 2024-07-08 DIAGNOSIS — Z923 Personal history of irradiation: Secondary | ICD-10-CM | POA: Diagnosis not present

## 2024-07-08 DIAGNOSIS — J90 Pleural effusion, not elsewhere classified: Secondary | ICD-10-CM | POA: Diagnosis not present

## 2024-07-08 DIAGNOSIS — E119 Type 2 diabetes mellitus without complications: Secondary | ICD-10-CM | POA: Diagnosis not present

## 2024-07-08 DIAGNOSIS — R0602 Shortness of breath: Secondary | ICD-10-CM | POA: Diagnosis not present

## 2024-07-08 DIAGNOSIS — E66812 Obesity, class 2: Secondary | ICD-10-CM | POA: Diagnosis not present

## 2024-07-08 DIAGNOSIS — I5033 Acute on chronic diastolic (congestive) heart failure: Secondary | ICD-10-CM | POA: Diagnosis not present

## 2024-07-08 DIAGNOSIS — Z6841 Body Mass Index (BMI) 40.0 and over, adult: Secondary | ICD-10-CM | POA: Diagnosis not present

## 2024-07-08 DIAGNOSIS — Z87891 Personal history of nicotine dependence: Secondary | ICD-10-CM | POA: Diagnosis not present

## 2024-07-08 DIAGNOSIS — I517 Cardiomegaly: Secondary | ICD-10-CM | POA: Diagnosis not present

## 2024-07-10 ENCOUNTER — Other Ambulatory Visit: Payer: Self-pay

## 2024-07-10 NOTE — Discharge Summary (Signed)
 [facility]  Hospitalist Discharge Summary   Patient: Preston Thomas Medical Record #: [id number] Date of Birth: 1951-02-13 PCP: Doctor Sampson, MD  Date of Admission: 07/08/2024 Date of Discharge: 07/10/2024  Disposition: To Home  Admit Diagnosis: Heart failure (CMS-HCC)  Past Medical History[1] Consults:  SOCIAL WORKER CASE MANAGER SCREENING EDUCATION: DIET BY NUTRITIONIST/DIETITIAN CONSULT - CARDIOLOGY - [facility]  Procedures:  None  Summary of Hospital Course:  Patient admitted on 07/08/2024 with shortness of breath.  He initially presented to outside hospital with SVT and was converted with adenosine.  He was found to have pulmonary edema and required BiPAP briefly however after 1.8 L of diuresis he was able to tolerate nasal cannula at 3 L.  He was transferred to our facility for further evaluation and diuresis.  Echocardiogram was done and showed a normal EF without wall motion abnormality.  He was evaluated by cardiology who recommended beta-blocker and further evaluation by EP.  He was able to be diuresed with 4.1 L removed at our facility.  He will follow-up with his primary care physician outpatient as well as cardiology.  Patient was seen today and tells me that he feels like he is back to his normal.  He is stable for discharge to home. Physical Exam on Day of Discharge: BP 123/74   Pulse 96   Temp 36.4 C (97.5 F)   Resp 18   Ht 1.499 m (4' 11)   Wt 91.1 kg (200 lb 12.8 oz)   SpO2 96%   BMI 40.56 kg/m  Constitutional: Alert; in no acute distress.  Pleasant.  HEENT: Normal cephalic, Atraumatic. Normal nasal and oral mucosa.  Cardiovascular:  RRR. No murmurs, clicks, gallops, rubs, JVD, carotid bruit, chest wall tenderness, peripheral edema Respiratory: Lungs clear to auscultation bilaterally, no accessory muscle use, no subcutaneous emphysema GI: Abdomen soft, non-tender. BS normal. no masses,  no organomegaly, no abdominal bruit  Labs In  Last 12 Hrs: @[email or web address] @  Radiology Tests :  Results for orders placed or performed during the hospital encounter of 07/08/24 (from the past 24 hours)  ECHO   Collection Time: 07/10/24 11:14 AM  Test Value Low-High   EC RVDd 2.1 cm   EC IVSd 1 cm   EC LVIDd 3.5 cm   EC LVIDs 2.5 cm   EC LVPWd 1 cm   EC IVS/LVPW 0.98    EC FS 28.6 %   EC EDV(Teich) 51.9 ml   EC ESV(Teich) 22.8 ml   EC EF(Teich) 56.2 %   EC EF (est.) 57.4 %   EC EDV(cubed) 44 ml   EC ESV(cubed) 16 ml   EC EF(cubed) 63.6 %   EC LV mass(C)d 107.8 grams   EC LV mass(C)dI 58 grams/m\S\2   EC SV(Teich) 29.2 ml   EC SI(Teich) 15.7 ml/m\S\2   EC SV(cubed) 28 ml   EC SI(cubed) 15 ml/m\S\2   EC Ao root diam 3.2 cm   EC Ao root area 8.1 cm\S\2   EC LA dimension 2.7 cm   EC ascAortaDiam 3.4 cm   EC LA/Ao 0.83    EC LVOT diam 2.1 cm   EC LVOT area 3.5 cm\S\2   EC LVOTareatraced 3.5 cm\S\2   EC LVLd ap4 8.7 cm   EC EDV(MOD-sp4) 84.4 ml   EC LVLs ap4 7.8 cm   EC ESV(MOD-sp4) 36.7 ml   EC EF(MOD-sp4) 56.5 %   EC LVLd ap2 8.8 cm   EC EDV(MOD-sp2) 63.1 ml   EC LVLs ap2  7 cm   EC ESV(MOD-sp2) 24.5 ml   EC EF(MOD-sp2) 61.2 %   EC SV(MOD-sp4) 47.7 ml   EC SI(MOD-sp4) 25.7 ml/m\S\2   EC SV(MOD-sp2) 38.6 ml   EC SI(MOD-sp2) 20.8 ml/m\S\2   EC MVEmaxvel 76.4 cm/sec   EC MVAmaxvel 89.1 cm/sec   EC MV E/A 0.86    EC MV dec time 0.2 sec   EC Ao V2 max 142.2 cm/sec   EC Ao max PG (full) 4.4 mmHg   EC AVA(V,A) 2.3 cm\S\2   EC AVA(V,D) 2.3 cm\S\2   EC LVOTmaxgradient 3.7 mmHg   EC LVOTmaxvel 96.5 cm/sec   EC PA V2 max 97.5 cm/sec   EC PA max PG 3.8 mmHg   EC PA acc time 0.04 sec   EC PA pr(Accel) 61.2 mmHg   Narrative   Reason for Exam: Congestive Heart Failure^Congest Date of Service: 07/10/2024 Patient Height  149.85999999999999  cm Patient Weight  92.5344  kg Heart Rate  82  bpm Systolic Pressure  136  mmHg Diastolic Pressure  80  mmHg  StudyLocation: [facility] BSA  1.9  m^2  Procedure: A complete  two-dimensional transthoracic echocardiogram was performed (2D, M-mode, spectral and color flow Doppler). Study Quality: Technically suboptimal. There was technical limitations during this study due to patients body habitas.. A contrast injection of Definity  was performed to improve assessment of LV function.  Left Ventricle: The left ventricular size, thickness and function are normal. Ejection Fraction = 55-60%. There is no thrombus. LV diastolic function can not be accurately assessed.  Left Atrium: The left atrium is mildly dilated. There is no Doppler evidence for an atrial septal defect.  Right Atrium: Right atrial size is normal.  Right Ventricle: The right ventricle is normal in size and function.  Aortic Valve: Structurally normal aortic valve. The aortic valve is trileaflet. There is mild aortic sclerosis.; No hemodynamically significant valvular aortic stenosis. No aortic regurgitation is present.  Mitral Valve: There is mild mitral annular calcification. There is trace mitral regurgitation.  Tricuspid Valve: The tricuspid valve is not well visualized. There is trace tricuspid regurgitation. There was insufficient TR detected to calculate RV systolic pressure.  Pulmonic Valve: The Pulmonic Valve is partially seen. Trace pulmonic valvular regurgitation.  Arteries: The aortic root is normal size. The ascending aorta is normal size.  Venous: The inferior vena cava is normal in size, with a normal collapsibility index.  Pericardium/Pleura: The pericardium appears normal.  MMode 2D Measurements & Calculations RVDd - 2.1 cm IVSd - 1 cm LVIDd - 3.5 cm LVIDs - 2.5 cm LVPWd - 1 cm IVS/LVPW - 0.98  FS - 28.6 % EDV(Teich) - 51.9 ml ESV(Teich) - 22.8 ml EF(Teich) - 56.2 % EF (est.) - 57.4 % EDV(cubed) - 44 ml ESV(cubed) - 16 ml EF(cubed) - 63.6 % LV mass(C)d - 107.8 grams LV mass(C)dI - 58 grams/m^2 SV(Teich) - 29.2 ml SI(Teich) - 15.7  ml/m^2 SV(cubed) - 28 ml SI(cubed) - 15 ml/m^2 Ao root diam - 3.2 cm Ao root area - 8.1 cm^2 LA dimension - 2.7 cm asc Aorta Diam - 3.4 cm LA/Ao - 0.83  LVOT diam - 2.1 cm LVOT area - 3.5 cm^2 LVOT area(traced) - 3.5 cm^2 LVLd ap4 - 8.7 cm EDV(MOD-sp4) - 84.4 ml LVLs ap4 - 7.8 cm ESV(MOD-sp4) - 36.7 ml EF(MOD-sp4) - 56.5 % LVLd ap2 - 8.8 cm EDV(MOD-sp2) - 63.1 ml LVLs ap2 - 7 cm ESV(MOD-sp2) - 24.5 ml EF(MOD-sp2) - 61.2 % SV(MOD-sp4) - 47.7 ml SI(MOD-sp4) -  25.7 ml/m^2 SV(MOD-sp2) - 38.6 ml SI(MOD-sp2) - 20.8 ml/m^2  Doppler Measurements & Calculations MV E max vel - 76.4 cm/sec MV A max vel - 89.1 cm/sec MV E/A - 0.86  MV dec time - 0.2 sec Ao V2 max - 142.2 cm/sec Ao max PG (full) - 4.4 mmHg AVA(V,A) - 2.3 cm^2 AVA(V,D) - 2.3 cm^2 LVOT max gradient - 3.7 mmHg LVOT max vel - 96.5 cm/sec PA V2 max - 97.5 cm/sec PA max PG - 3.8 mmHg PA acc time - 0.04 sec PA pr(Accel) - 61.2 mmHg  Conclusion Ejection Fraction = 55-60%. The left atrium is mildly dilated. There is trace mitral regurgitation. There is trace tricuspid regurgitation. The inferior vena cava is normal in size, with a normal collapsibility index.  InterpretingPhysician:Interpreting Physician:   Norwood Doss, M.D.  electronically signed on 2024-07-10 13:10:42.05   Discharge Condition: good   Discharge Activity:   As Tolerated  Discharge Diet:  Diet Orders (From admission, onward)         Ordered    2gm Sodium Restricted  Diet Effective Now       Question Answer Comment  Nutrition Therapy: Sodium Restricted   Sodium Restriction: 2 gram      07/08/24 1653           Discharge Diagnosis: Heart failure (CMS-HCC) Active Hospital Problems   Diagnosis  . *Heart failure (CMS-HCC)  . Prostate cancer (CMS-HCC)  . Primary hypertension  . New onset of congestive heart failure (CMS-HCC)  . Obstructive sleep apnea    Discharge Medications:    Medication List     START taking these  medications      Instructions  furosemide  40 mg Tab Commonly known as: LASIX   Take 1 Tablet (40 mg total) by mouth in the morning.   lisinopriL 5 mg Tab Commonly known as: PRINIVIL,ZESTRIL Start taking on: July 11, 2024  Take 1 Tablet (5 mg total) by mouth in the morning.   metoprolol tartrate 25 mg Tab Commonly known as: LOPRESSOR  Take 0.5 Tablets (12.5 mg total) by mouth in the morning and 0.5 Tablets (12.5 mg total) before bedtime.       CONTINUE taking these medications      Instructions  abiraterone  250 mg Tab Commonly known as: ZYTIGA   Take 4 Tablets (1,000 mg total) by mouth in the morning.   ascorbic acid  (VITAMIN C ) 250 mg Tab  Take 1 Tablet (250 mg total) by mouth in the morning.   atorvastatin  20 mg Tab Commonly known as: LIPITOR  Take 1 Tablet (20 mg total) by mouth in the morning.   cyanocobalamin  500 mcg Tab Commonly known as: VITAMIN B-12  Take 1 Tablet (500 mcg total) by mouth in the morning.   ergocalciferol  1,250 mcg (50,000 unit) Cap Commonly known as: VITAMIN D2  Take 1 Capsule (50,000 Units total) by mouth every week.   metFORMIN  500 mg Tab Commonly known as: GLUCOPHAGE   Take 1 Tablet (500 mg total) by mouth in the morning and 1 Tablet (500 mg total) in the evening. Take with meals.   ondansetron  ODT 4 mg Tbdl Commonly known as: ZOFRAN -ODT  Take 1 Tablet (4 mg total) by mouth every 8 hours as needed.   potassium chloride  20 mEq Tbtq Commonly known as: KLOR-CON  M20  Take 1 Tablet (20 mEq total) by mouth in the morning and 1 Tablet (20 mEq total) before bedtime.   predniSONE  5 mg Tab Commonly known as: DELTASONE   Take 1 Tablet (  5 mg total) by mouth with breakfast.   relugolix  120 mg Tab Commonly known as: ORGOVYX   Take 1 Tablet (120 mg total) by mouth in the morning.   SUMAtriptan  100 mg Tab Commonly known as: IMITREX   Take 1 Tablet (100 mg total) by mouth as directed.   tamsulosin  0.4 mg Cap Commonly known as: FLOMAX   Take 1  Capsule (0.4 mg total) by mouth.       STOP taking these medications    hydroCHLOROthiazide  25 mg Tab       Discharge Followup:  As noted below.   Discharge Procedure Orders  Discharge Instructions  Order Comments: Follow up with cardiology and primary care. Take your lasix , lisinopril and metoprolol which are all new on discharge. Please weigh yourself daily and if you gain more than 2 lbs in 48 hours please call your doctor to adjust your diuretics. Avoid high salt foods.   Diet Message  Order Comments: Resume Diet as prior to Admission per Outpatient Dietitian Recommendations.   Call Doctor's Office If Chest Pain   Call Doctor's Office If Shortness Of Breath   In The Case Of An Emergency, Call 911   Call Doctor's Office If Change In Mental Status   Rest Frequently     Time spent on D/C home 33 minutes with >50% of time spent in council and coordination of care.  Prentice JONETTA Ruth, DO   07/10/2024       [1] Past Medical History: Diagnosis Date  . Bilateral lower extremity edema   . CHF (congestive heart failure) (CMS-HCC) 07/08/2024  . COVID-19   . Hypertension   . Morbid obesity (CMS-HCC)   . OSA (obstructive sleep apnea) 06/2019   CPAP recommended  . Prediabetes 08/2020   A1c=5.3 Oct-25  . Prostate cancer (CMS-HCC) 07/2018

## 2024-07-10 NOTE — Care Plan (Signed)
  Problem: Adult Inpatient Plan of Care Goal: Plan of Care Review Outcome: Met Goal: Patient-Specific Goal (Individualized) Outcome: Met Goal: Absence of Hospital-Acquired Illness or Injury Outcome: Met Goal: Optimal Comfort and Wellbeing Outcome: Met Goal: Readiness for Transition of Care Outcome: Met   Problem: Heart Failure Goal: Optimal Coping Outcome: Met Goal: Optimal Cardiac Output Outcome: Met Goal: Stable Heart Rate and Rhythm Outcome: Met Goal: Optimal Functional Ability Outcome: Met Goal: Fluid and Electrolyte Balance Outcome: Met Goal: Improved Oral Intake Outcome: Met Goal: Effective Oxygenation and Ventilation Outcome: Met Goal: Effective Breathing Pattern During Sleep Outcome: Met   Problem: Fall Injury Risk Goal: Absence of Fall and Fall-Related Injury Outcome: Met

## 2024-07-13 ENCOUNTER — Telehealth: Payer: Self-pay

## 2024-07-13 NOTE — Telephone Encounter (Signed)
 Called and spoke to patient.  He was admitted after findings of SVT, report heart rate was 200.  He was having edema and needed diuresis inpatient.  He has EP appointment tomorrow.  Clinically, he is feeling better.  Still feeling weak. Discussed holding Relugolix , abiraterone  and prednisone  for now.  Discussed prostate cancer treatment may increase cardiovascular events.  They are small risk of arrhythmia, heart failure, MI moving forward.  Will continue follow-up with PSA and clinically.  We can resume therapy in the future after his cardiac workup.

## 2024-07-14 ENCOUNTER — Encounter: Payer: Self-pay | Admitting: Cardiology

## 2024-07-14 ENCOUNTER — Ambulatory Visit: Attending: Cardiology | Admitting: Cardiology

## 2024-07-14 ENCOUNTER — Ambulatory Visit (INDEPENDENT_AMBULATORY_CARE_PROVIDER_SITE_OTHER)

## 2024-07-14 VITALS — BP 112/60 | HR 81 | Ht 59.0 in | Wt 201.0 lb

## 2024-07-14 DIAGNOSIS — R0602 Shortness of breath: Secondary | ICD-10-CM

## 2024-07-14 DIAGNOSIS — I10 Essential (primary) hypertension: Secondary | ICD-10-CM | POA: Diagnosis not present

## 2024-07-14 DIAGNOSIS — I471 Supraventricular tachycardia, unspecified: Secondary | ICD-10-CM | POA: Diagnosis not present

## 2024-07-14 NOTE — Patient Instructions (Signed)
 Medication Instructions:  Your physician recommends that you continue on your current medications as directed. Please refer to the Current Medication list given to you today.   *If you need a refill on your cardiac medications before your next appointment, please call your pharmacy*  Lab Work: No labs ordered today  If you have labs (blood work) drawn today and your tests are completely normal, you will receive your results only by: MyChart Message (if you have MyChart) OR A paper copy in the mail If you have any lab test that is abnormal or we need to change your treatment, we will call you to review the results.  Testing/Procedures: GEOFFRY HEWS- Long Term Monitor Instructions  Your physician has requested you wear a ZIO patch monitor for 14 days.  This is a single patch monitor. [employer] supplies one patch monitor per enrollment. Additional stickers are not available. Please do not apply patch if you will be having a Nuclear Stress Test, Echocardiogram, Cardiac CT, MRI, or Chest Xray during the period you would be wearing the monitor. The patch cannot be worn during these tests. You cannot remove and re-apply the ZIO XT patch monitor.  Your ZIO patch monitor will be mailed 3 day USPS to your address on file. It may take 3-5 days to receive your monitor after you have been enrolled. Once you have received your monitor, please review the enclosed instructions. Your monitor has already been registered assigning a specific monitor serial number to you.  Billing and Patient Assistance Program Information  We have supplied [employer] with any of your insurance information on file for billing purposes.  [employer] offers a sliding scale Patient Assistance Program for patients that do not have insurance, or whose insurance does not completely cover the cost of the ZIO monitor.  You must apply for the Patient Assistance Program to qualify for this discounted rate.  To apply, please call [employer] at [phone],  select option 4, select option 2, ask to apply for Patient Assistance Program. Meredeth will ask your household income, and how many people are in your household. They will quote your out-of-pocket cost based on that information. [employer] will also be able to set up a 69-month, interest-free payment plan if needed.  Applying the monitor   Shave hair from upper left chest.  Hold abrader disc by orange tab. Rub abrader in 40 strokes over the upper left chest as indicated in your monitor instructions.  Clean area with 4 enclosed alcohol pads. Let dry.  Apply patch as indicated in monitor instructions. Patch will be placed under collarbone on left side of chest with arrow pointing upward.  Rub patch adhesive wings for 2 minutes. Remove white label marked 1. Remove the white label marked 2. Rub patch adhesive wings for 2 additional minutes.  While looking in a mirror, press and release button in center of patch. A small green light will flash 3-4 times. This will be your only indicator that the monitor has been turned on.  Do not shower for the first 24 hours. You may shower after the first 24 hours.  Press the button if you feel a symptom. You will hear a small click. Record Date, Time and Symptom in the Patient Logbook.  When you are ready to remove the patch, follow instructions on the last 2 pages of Patient Logbook.  Stick patch monitor into the tabs at the bottom of the return box.  Place Patient Logbook in the blue and white box. Use locking tab  on box and tape box closed securely. The blue and white box has prepaid postage on it. Please place it in the mailbox as soon as possible. Your physician should have your test results approximately 7-14 days after the monitor has been mailed back to [facility].  Call [facility] Customer Care at [phone] if you have questions regarding your ZIO XT patch monitor.  Call them immediately if you see an orange light blinking on your monitor.  If  your monitor falls off in less than 4 days, contact our Monitor department at [phone].  If your monitor becomes loose or falls off after 4 days call [employer] at [phone] for suggestions on securing your monitor.   Follow-Up: At [facility], you and your health needs are our priority.  As part of our continuing mission to provide you with exceptional heart care, our providers are all part of one team.  This team includes your primary Cardiologist (physician) and Advanced Practice Providers or APPs (Physician Assistants and Nurse Practitioners) who all work together to provide you with the care you need, when you need it.  Your next appointment:   3 month(s)  Provider:   You may see Redell Cave, MD or one of the following Advanced Practice Providers on your designated Care Team:   Lonni Meager, NP Lesley Maffucci, PA-C Bernardino Bring, PA-C Cadence Ord, PA-C Tylene Lunch, NP Barnie Hila, NP    We recommend signing up for the patient portal called MyChart.  Sign up information is provided on this After Visit Summary.  MyChart is used to connect with patients for Virtual Visits (Telemedicine).  Patients are able to view lab/test results, encounter notes, upcoming appointments, etc.  Non-urgent messages can be sent to your provider as well.   To learn more about what you can do with MyChart, go to [email or web address].

## 2024-07-14 NOTE — Progress Notes (Signed)
 Cardiology Office Note:    Date:  07/14/2024   ID:  Preston Thomas, DOB Jun 15, 1951, MRN [id number]  PCP:  Gretta Comer POUR, NP   [facility] Providers Cardiologist:  Redell Cave, MD     Referring MD: Gretta Comer POUR, NP   Chief Complaint  Patient presents with   Follow-up    hosp follow up pt has been doing well with no complaints of chest pain, chest pressure or SOB, medciation reviewed verbally with patient    History of Present Illness:    Preston Thomas is a 73 y.o. male with a hx of hypertension, SVT, GERD, morbid obesity, OSA/CPAP prostate cancer  who presents for hospital follow-up.  Admitted 6 days ago at [facility] due to symptoms of shortness of breath, noted by EMS to be in SVT.  Apparently was given adenosine for SVT with resolution of symptoms.  No strips available on EMR for review.  Evaluated by cardiology while in house, also had leg edema which he attributes to prostate cancer medications.  Managed with IV Lasix .  Low-dose beta-blocker and oral Lasix  was started prior to discharge.  Echo during hospital admission showed EF 55 to 60%, no significant structural abnormalities.  Leg edema is much improved, denies chest pain, denies palpitations.   Prior notes/testing Echo 07/17/2024 EF 55 to 60% Echo 05/2022 showed normal EF 55 to 60%.   Past Medical History:  Diagnosis Date   Acute conjunctivitis of both eyes 02/20/2022   Bilateral lower extremity edema    Cancer (HCC)    COLONIC POLYPS, HX OF 08/31/2007   Qualifier: Diagnosis of  By: Jimmy MD, Charlie Scarlet    DIVERTICULITIS, HX OF 08/31/2007   Qualifier: Diagnosis of  By: Edmond CMA (AAMA), Natasha     DIVERTICULOSIS, COLON 08/31/2007   Qualifier: Diagnosis of  By: Jimmy MD, Charlie Scarlet    Dyspnea    Related to Covid   Gallbladder problem    GERD (gastroesophageal reflux disease)    History of kidney stones    Hx of radiation therapy 04/11/2019   Pneumonia due to  COVID-19 virus 04/19/2019   Prostate cancer (HCC)    RENAL CALCULUS, HX OF 08/31/2007   Qualifier: Diagnosis of  By: Edmond CMA (AAMA), Natasha     Shortness of breath 02/21/2020   Sleep apnea    Swallowing difficulty    Syncope, near 03/27/2022    Past Surgical History:  Procedure Laterality Date   APPENDECTOMY  1987   CHOLECYSTECTOMY  1987   RADIOACTIVE SEED IMPLANT N/A 06/27/2019   Procedure: RADIOACTIVE SEED IMPLANT/BRACHYTHERAPY IMPLANT;  Surgeon: Twylla Glendia BROCKS, MD;  Location: [facility] ORS;  Service: Urology;  Laterality: N/A;    Current Medications: Current Meds  Medication Sig   abiraterone  acetate (ZYTIGA ) 250 MG tablet Take 4 tablets (1,000 mg total) by mouth daily. Take on an empty stomach 1 hour before or 2 hours after a meal   atorvastatin  (LIPITOR) 20 MG tablet Take 1 tablet (20 mg total) by mouth daily. for cholesterol   furosemide  (LASIX ) 40 MG tablet Take 40 mg by mouth daily.   lisinopril (ZESTRIL) 5 MG tablet Take 5 mg by mouth daily.   metoprolol tartrate (LOPRESSOR) 25 MG tablet Take 12.5 mg by mouth 2 (two) times daily.   omeprazole  (PRILOSEC) 20 MG capsule Take 1 capsule (20 mg total) by mouth 2 (two) times daily before a meal for heartburn.   potassium chloride  (KLOR-CON  M) 10 MEQ tablet Take  2 tablets (20 mEq total) by mouth daily.   predniSONE  (DELTASONE ) 5 MG tablet Take 1 tablet (5 mg total) by mouth daily with breakfast.   relugolix  (ORGOVYX ) 120 MG tablet Take 3 tablets (360 mg total) by mouth on day 1, then take 1 tablet (120 mg total) daily thereafter.   SUMAtriptan  (IMITREX ) 100 MG tablet TAKE 1 TABLET BY MOUTH AT MIGRAINE ONSET,MAY REPEAT IN 2 HOURS IF HEADACHE PERSISTS/RECURS   tamsulosin  (FLOMAX ) 0.4 MG CAPS capsule Take 1 capsule (0.4 mg total) by mouth daily.     Allergies:   Penicillins   Social History   Socioeconomic History   Marital status: Married    Spouse name: Not on file   Number of children: Not on file   Years of education: Not  on file   Highest education level: Not on file  Occupational History   Not on file  Tobacco Use   Smoking status: Former    Current packs/day: 0.00    Average packs/day: 1.5 packs/day for 10.0 years (15.0 ttl pk-yrs)    Types: Cigarettes    Start date: 09/28/1974    Quit date: 09/28/1984    Years since quitting: 39.8   Smokeless tobacco: Never  Vaping Use   Vaping status: Never Used  Substance and Sexual Activity   Alcohol use: Not Currently   Drug use: Never   Sexual activity: Yes  Other Topics Concern   Not on file  Social History Narrative   Not on file   Social Drivers of Health   Financial Resource Strain: Low Risk  (09/16/2023)   Overall Financial Resource Strain (CARDIA)    Difficulty of Paying Living Expenses: Not very hard  Food Insecurity: No Food Insecurity (02/18/2024)   Hunger Vital Sign    Worried About Running Out of Food in the Last Year: Never true    Ran Out of Food in the Last Year: Never true  Transportation Needs: No Transportation Needs (02/18/2024)   PRAPARE - Administrator, Civil Service (Medical): No    Lack of Transportation (Non-Medical): No  Physical Activity: Sufficiently Active (09/16/2023)   Exercise Vital Sign    Days of Exercise per Week: 5 days    Minutes of Exercise per Session: 50 min  Stress: No Stress Concern Present (09/16/2023)   [employer] of Occupational Health - Occupational Stress Questionnaire    Feeling of Stress : Not at all  Social Connections: Socially Integrated (09/16/2023)   Social Connection and Isolation Panel    Frequency of Communication with Friends and Family: More than three times a week    Frequency of Social Gatherings with Friends and Family: More than three times a week    Attends Religious Services: More than 4 times per year    Active Member of [employer] or Organizations: Yes    Attends Engineer, structural: More than 4 times per year    Marital Status: Married     Family  History: The patient's family history includes Early death in his father; Heart failure in his paternal grandfather and sister; Kidney disease (age of onset: 56) in his father; Suicidality in his maternal grandfather.  ROS:   Please see the history of present illness.     All other systems reviewed and are negative.  EKGs/Labs/Other Studies Reviewed:    The following studies were reviewed today:  EKG Interpretation Date/Time:  Friday July 14 2024 11:21:13 EDT Ventricular Rate:  81 PR Interval:  192 QRS  Duration:  80 QT Interval:  358 QTC Calculation: 415 R Axis:   -13  Text Interpretation: Normal sinus rhythm Nonspecific T wave abnormality Confirmed by Darliss Rogue ([id number]) on 07/14/2024 11:33:20 AM    Recent Labs: 09/01/2023: TSH 2.840 04/20/2024: Magnesium 2.0 06/08/2024: ALT 15; BUN 17; Creatinine 1.18; Hemoglobin 12.4; Platelet Count 308; Potassium 3.8; Sodium 145  Recent Lipid Panel    Component Value Date/Time   CHOL 132 09/01/2023 0853   TRIG 75 09/01/2023 0853   HDL 36 (L) 09/01/2023 0853   CHOLHDL 4 12/19/2021 1246   VLDL 31.8 12/19/2021 1246   LDLCALC 81 09/01/2023 0853   LDLDIRECT 167.0 05/15/2019 1100     Risk Assessment/Calculations:              Physical Exam:    VS:  BP 112/60 (BP Location: Left Arm, Patient Position: Sitting, Cuff Size: Normal)   Pulse 81   Ht 4' 11 (1.499 m)   Wt 201 lb (91.2 kg)   SpO2 97%   BMI 40.60 kg/m     Wt Readings from Last 3 Encounters:  07/14/24 201 lb (91.2 kg)  06/08/24 208 lb 3.2 oz (94.4 kg)  05/11/24 198 lb 4.8 oz (89.9 kg)     GEN:  Well nourished, well developed in no acute distress HEENT: Normal NECK: No JVD; No carotid bruits CARDIAC: RRR, no murmurs, rubs, gallops RESPIRATORY:  Clear to auscultation without rales, wheezing or rhonchi  ABDOMEN: Soft, non-tender, non-distended MUSCULOSKELETAL:  No edema; No deformity  SKIN: Warm and dry NEUROLOGIC:  Alert and oriented x 3 PSYCHIATRIC:   Normal affect   ASSESSMENT:    1. SVT (supraventricular tachycardia)   2. Primary hypertension   3. Shortness of breath    PLAN:    In order of problems listed above:  SVT, needing adenosine per EMS 6 days ago.  Denies palpitations.  Order cardiac monitor to evaluate arrhythmia recurrence.  Continue Lopressor 12.5 mg twice daily.  Echo with normal EF 55 to 60%.  Referr to EP. Hypertension, BP controlled.  Continue Lopressor 12.5 mg twice daily, lisinopril 5 mg daily. Shortness of breath, denies chest pain.  Echo with normal EF.  Etiology combination of morbid obesity, sleep apnea, deconditioning.  Weight loss, increase activity advised.  Follow-up in 4 months.     Medication Adjustments/Labs and Tests Ordered: Current medicines are reviewed at length with the patient today.  Concerns regarding medicines are outlined above.  Orders Placed This Encounter  Procedures   Ambulatory referral to Cardiac Electrophysiology   LONG TERM MONITOR (3-14 DAYS)   EKG 12-Lead   No orders of the defined types were placed in this encounter.   Patient Instructions  Medication Instructions:  Your physician recommends that you continue on your current medications as directed. Please refer to the Current Medication list given to you today.   *If you need a refill on your cardiac medications before your next appointment, please call your pharmacy*  Lab Work: No labs ordered today  If you have labs (blood work) drawn today and your tests are completely normal, you will receive your results only by: MyChart Message (if you have MyChart) OR A paper copy in the mail If you have any lab test that is abnormal or we need to change your treatment, we will call you to review the results.  Testing/Procedures: GEOFFRY HEWS- Long Term Monitor Instructions  Your physician has requested you wear a ZIO patch monitor for 14 days.  This is  a single patch monitor. [employer] supplies one patch monitor per enrollment.  Additional stickers are not available. Please do not apply patch if you will be having a Nuclear Stress Test, Echocardiogram, Cardiac CT, MRI, or Chest Xray during the period you would be wearing the monitor. The patch cannot be worn during these tests. You cannot remove and re-apply the ZIO XT patch monitor.  Your ZIO patch monitor will be mailed 3 day USPS to your address on file. It may take 3-5 days to receive your monitor after you have been enrolled. Once you have received your monitor, please review the enclosed instructions. Your monitor has already been registered assigning a specific monitor serial number to you.  Billing and Patient Assistance Program Information  We have supplied [employer] with any of your insurance information on file for billing purposes.  [employer] offers a sliding scale Patient Assistance Program for patients that do not have insurance, or whose insurance does not completely cover the cost of the ZIO monitor.  You must apply for the Patient Assistance Program to qualify for this discounted rate.  To apply, please call [employer] at [phone], select option 4, select option 2, ask to apply for Patient Assistance Program. Meredeth will ask your household income, and how many people are in your household. They will quote your out-of-pocket cost based on that information. [employer] will also be able to set up a 13-month, interest-free payment plan if needed.  Applying the monitor   Shave hair from upper left chest.  Hold abrader disc by orange tab. Rub abrader in 40 strokes over the upper left chest as indicated in your monitor instructions.  Clean area with 4 enclosed alcohol pads. Let dry.  Apply patch as indicated in monitor instructions. Patch will be placed under collarbone on left side of chest with arrow pointing upward.  Rub patch adhesive wings for 2 minutes. Remove white label marked 1. Remove the white label marked 2. Rub patch adhesive wings for 2 additional  minutes.  While looking in a mirror, press and release button in center of patch. A small green light will flash 3-4 times. This will be your only indicator that the monitor has been turned on.  Do not shower for the first 24 hours. You may shower after the first 24 hours.  Press the button if you feel a symptom. You will hear a small click. Record Date, Time and Symptom in the Patient Logbook.  When you are ready to remove the patch, follow instructions on the last 2 pages of Patient Logbook.  Stick patch monitor into the tabs at the bottom of the return box.  Place Patient Logbook in the blue and white box. Use locking tab on box and tape box closed securely. The blue and white box has prepaid postage on it. Please place it in the mailbox as soon as possible. Your physician should have your test results approximately 7-14 days after the monitor has been mailed back to [facility].  Call [facility] Customer Care at [phone] if you have questions regarding your ZIO XT patch monitor.  Call them immediately if you see an orange light blinking on your monitor.  If your monitor falls off in less than 4 days, contact our Monitor department at [phone].  If your monitor becomes loose or falls off after 4 days call [employer] at [phone] for suggestions on securing your monitor.   Follow-Up: At [facility], you and your health needs are our priority.  As part  of our continuing mission to provide you with exceptional heart care, our providers are all part of one team.  This team includes your primary Cardiologist (physician) and Advanced Practice Providers or APPs (Physician Assistants and Nurse Practitioners) who all work together to provide you with the care you need, when you need it.  Your next appointment:   3 month(s)  Provider:   You may see Redell Cave, MD or one of the following Advanced Practice Providers on your designated Care Team:   Lonni Meager,  NP Lesley Maffucci, PA-C Bernardino Bring, PA-C Cadence [place], PA-C Tylene Lunch, NP Barnie Hila, NP    We recommend signing up for the patient portal called MyChart.  Sign up information is provided on this After Visit Summary.  MyChart is used to connect with patients for Virtual Visits (Telemedicine).  Patients are able to view lab/test results, encounter notes, upcoming appointments, etc.  Non-urgent messages can be sent to your provider as well.   To learn more about what you can do with MyChart, go to [email or web address].             Signed, Redell Cave, MD  07/14/2024 12:16 PM    [facility]

## 2024-07-17 ENCOUNTER — Encounter: Payer: Self-pay | Admitting: Primary Care

## 2024-07-17 ENCOUNTER — Ambulatory Visit (INDEPENDENT_AMBULATORY_CARE_PROVIDER_SITE_OTHER): Admitting: Primary Care

## 2024-07-17 VITALS — BP 112/66 | HR 90 | Temp 97.5°F | Ht 59.0 in | Wt 203.0 lb

## 2024-07-17 DIAGNOSIS — J811 Chronic pulmonary edema: Secondary | ICD-10-CM | POA: Insufficient documentation

## 2024-07-17 DIAGNOSIS — I10 Essential (primary) hypertension: Secondary | ICD-10-CM | POA: Diagnosis not present

## 2024-07-17 DIAGNOSIS — J810 Acute pulmonary edema: Secondary | ICD-10-CM

## 2024-07-17 DIAGNOSIS — I471 Supraventricular tachycardia, unspecified: Secondary | ICD-10-CM | POA: Diagnosis not present

## 2024-07-17 NOTE — Assessment & Plan Note (Signed)
 With recent hospitalization, hospital notes, labs, imaging reviewed through Care Everywhere.  Continue furosemide  40 mg daily for now. Reviewed cardiology notes from October 2025.

## 2024-07-17 NOTE — Progress Notes (Signed)
 Subjective:    Patient ID: Preston Thomas, male    DOB: Nov 13, 1950, 73 y.o.   MRN: [id number]  Preston Thomas is a very pleasant 73 y.o. male with a history of hypertension, migraines, OSA, prostate cancer, hyperlipidemia who presents today for hospital follow-up.  Admitted to [facility] on 07/08/24 for dyspnea.  He was initially noted to have SVT and was converted with adenosine.  Also with pulmonary edema requiring BiPAP briefly.  During his admission he completed echocardiogram which showed normal LVEF without wall motion abnormality.  BNP was 3,237. Cardiology consulted who recommended beta-blocker therapy and electrophysiology evaluation.  He was also treated with diuretics which improved dyspnea and other symptoms. His hydrochlorothiazide  was discontinued.   He was discharged home on 07/10/2024 with prescriptions for furosemide  40 mg daily, lisinopril 5 mg daily, metoprolol tartrate 12.5 mg twice daily.  Since his hospital discharge he's feeling much better. He is compliant to all new medications. Evaluated by cardiology on 07/14/24. A Holter monitor was ordered and a referral was placed for EP. He denies palpitations, chest pain, shortness of breath.   He is monitoring BP, HR, and weight daily. His weights have been stable. He is aware when to notify for weight gain.  BP Readings from Last 3 Encounters:  07/17/24 112/66  07/14/24 112/60  06/08/24 134/66   Wt Readings from Last 3 Encounters:  07/17/24 203 lb (92.1 kg)  07/14/24 201 lb (91.2 kg)  06/08/24 208 lb 3.2 oz (94.4 kg)      Review of Systems  Constitutional:  Positive for fatigue.  Respiratory:  Negative for cough and shortness of breath.   Cardiovascular:  Negative for chest pain and leg swelling.  Neurological:  Negative for dizziness and headaches.         Past Medical History:  Diagnosis Date   Acute conjunctivitis of both eyes 02/20/2022   Bilateral lower  extremity edema    Cancer (HCC)    COLONIC POLYPS, HX OF 08/31/2007   Qualifier: Diagnosis of  By: Jimmy MD, Charlie Scarlet    DIVERTICULITIS, HX OF 08/31/2007   Qualifier: Diagnosis of  By: Edmond CMA (AAMA), Natasha     DIVERTICULOSIS, COLON 08/31/2007   Qualifier: Diagnosis of  By: Jimmy MD, Charlie Scarlet    Dyspnea    Related to Covid   Gallbladder problem    GERD (gastroesophageal reflux disease)    History of kidney stones    Hx of radiation therapy 04/11/2019   Pneumonia due to COVID-19 virus 04/19/2019   Prostate cancer (HCC)    RENAL CALCULUS, HX OF 08/31/2007   Qualifier: Diagnosis of  By: Edmond CMA (AAMA), Natasha     Shortness of breath 02/21/2020   Sleep apnea    Swallowing difficulty    Syncope, near 03/27/2022    Social History   Socioeconomic History   Marital status: Married    Spouse name: Not on file   Number of children: Not on file   Years of education: Not on file   Highest education level: Not on file  Occupational History   Not on file  Tobacco Use   Smoking status: Former    Current packs/day: 0.00    Average packs/day: 1.5 packs/day for 10.0 years (15.0 ttl pk-yrs)    Types: Cigarettes    Start date: 09/28/1974    Quit date: 09/28/1984    Years since quitting: 39.8   Smokeless tobacco: Never  Vaping Use  Vaping status: Never Used  Substance and Sexual Activity   Alcohol use: Not Currently   Drug use: Never   Sexual activity: Yes  Other Topics Concern   Not on file  Social History Narrative   Not on file   Social Drivers of Health   Financial Resource Strain: Low Risk  (09/16/2023)   Overall Financial Resource Strain (CARDIA)    Difficulty of Paying Living Expenses: Not very hard  Food Insecurity: No Food Insecurity (02/18/2024)   Hunger Vital Sign    Worried About Running Out of Food in the Last Year: Never true    Ran Out of Food in the Last Year: Never true  Transportation Needs: No Transportation Needs (02/18/2024)   PRAPARE -  Administrator, Civil Service (Medical): No    Lack of Transportation (Non-Medical): No  Physical Activity: Sufficiently Active (09/16/2023)   Exercise Vital Sign    Days of Exercise per Week: 5 days    Minutes of Exercise per Session: 50 min  Stress: No Stress Concern Present (09/16/2023)   [employer] of Occupational Health - Occupational Stress Questionnaire    Feeling of Stress : Not at all  Social Connections: Socially Integrated (09/16/2023)   Social Connection and Isolation Panel    Frequency of Communication with Friends and Family: More than three times a week    Frequency of Social Gatherings with Friends and Family: More than three times a week    Attends Religious Services: More than 4 times per year    Active Member of Clubs or Organizations: Yes    Attends Banker Meetings: More than 4 times per year    Marital Status: Married  Catering manager Violence: Not At Risk (09/16/2023)   Humiliation, Afraid, Rape, and Kick questionnaire    Fear of Current or Ex-Partner: No    Emotionally Abused: No    Physically Abused: No    Sexually Abused: No    Past Surgical History:  Procedure Laterality Date   APPENDECTOMY  1987   CHOLECYSTECTOMY  1987   RADIOACTIVE SEED IMPLANT N/A 06/27/2019   Procedure: RADIOACTIVE SEED IMPLANT/BRACHYTHERAPY IMPLANT;  Surgeon: Twylla Glendia BROCKS, MD;  Location: [facility] ORS;  Service: Urology;  Laterality: N/A;    Family History  Problem Relation Age of Onset   Early death Father    Kidney disease Father 21   Heart failure Sister    Suicidality Maternal Grandfather    Heart failure Paternal Grandfather     Allergies  Allergen Reactions   Penicillins     Passed out Did it involve swelling of the face/tongue/throat, SOB, or low BP? Yes Did it involve sudden or severe rash/hives, skin peeling, or any reaction on the inside of your mouth or nose? No Did you need to seek medical attention at a hospital or doctor's  office? No When did it last happen?      30 + years If all above answers are NO, may proceed with cephalosporin use.     Current Outpatient Medications on File Prior to Visit  Medication Sig Dispense Refill   abiraterone  acetate (ZYTIGA ) 250 MG tablet Take 4 tablets (1,000 mg total) by mouth daily. Take on an empty stomach 1 hour before or 2 hours after a meal 120 tablet 11   atorvastatin  (LIPITOR) 20 MG tablet Take 1 tablet (20 mg total) by mouth daily. for cholesterol 90 tablet 2   furosemide  (LASIX ) 40 MG tablet Take 40 mg by mouth daily.  lisinopril (ZESTRIL) 5 MG tablet Take 5 mg by mouth daily.     metoprolol tartrate (LOPRESSOR) 25 MG tablet Take 12.5 mg by mouth 2 (two) times daily.     omeprazole  (PRILOSEC) 20 MG capsule Take 1 capsule (20 mg total) by mouth 2 (two) times daily before a meal for heartburn. 180 capsule 0   potassium chloride  (KLOR-CON  M) 10 MEQ tablet Take 2 tablets (20 mEq total) by mouth daily. 120 tablet 2   relugolix  (ORGOVYX ) 120 MG tablet Take 3 tablets (360 mg total) by mouth on day 1, then take 1 tablet (120 mg total) daily thereafter. 30 tablet 0   SUMAtriptan  (IMITREX ) 100 MG tablet TAKE 1 TABLET BY MOUTH AT MIGRAINE ONSET,MAY REPEAT IN 2 HOURS IF HEADACHE PERSISTS/RECURS 10 tablet 0   tamsulosin  (FLOMAX ) 0.4 MG CAPS capsule Take 1 capsule (0.4 mg total) by mouth daily. 90 capsule 2   hydrochlorothiazide  (HYDRODIURIL ) 25 MG tablet Take 1 tablet (25 mg total) by mouth daily. (Patient not taking: Reported on 07/17/2024) 90 tablet 3   metFORMIN  (GLUCOPHAGE ) 500 MG tablet Take 1 tablet (500 mg total) by mouth 2 (two) times daily with a meal. (Patient not taking: Reported on 07/17/2024) 180 tablet 0   predniSONE  (DELTASONE ) 5 MG tablet Take 1 tablet (5 mg total) by mouth daily with breakfast. (Patient not taking: Reported on 07/17/2024) 30 tablet 11   Zinc 100 MG TABS Take by mouth. (Patient not taking: Reported on 07/17/2024)     No current  facility-administered medications on file prior to visit.    BP 112/66   Pulse 90   Temp (!) 97.5 F (36.4 C) (Temporal)   Ht 4' 11 (1.499 m)   Wt 203 lb (92.1 kg)   SpO2 99%   BMI 41.00 kg/m  Objective:   Physical Exam Cardiovascular:     Rate and Rhythm: Normal rate and regular rhythm.  Pulmonary:     Effort: Pulmonary effort is normal.     Breath sounds: Normal breath sounds.  Musculoskeletal:     Cervical back: Neck supple.  Skin:    General: Skin is warm and dry.  Neurological:     Mental Status: He is alert and oriented to person, place, and time.  Psychiatric:        Mood and Affect: Mood normal.     Physical Exam        Assessment & Plan:  Hypertension, unspecified type Assessment & Plan: Controlled.  Continue lisinopril 5 mg daily, metoprolol tartrate 12.5 mg BID, furosemide  40 mg daily.    Acute pulmonary edema [facility]) Assessment & Plan: With recent hospitalization, hospital notes, labs, imaging reviewed through Care Everywhere.  Continue furosemide  40 mg daily for now. Reviewed cardiology notes from October 2025.   SVT (supraventricular tachycardia) Assessment & Plan: With recent hospitalization. Hospital notes, labs, imaging reviewed.  Continue metoprolol to tartrate 12.5 mg twice daily. Complete Holter monitor as ordered.     Assessment and Plan Assessment & Plan         Comer MARLA Gaskins, NP     History of Present Illness

## 2024-07-17 NOTE — Patient Instructions (Signed)
 Continue taking all of your medications as prescribed.  Complete the Holter monitor once received.  It was a pleasure to see you today!

## 2024-07-17 NOTE — Assessment & Plan Note (Signed)
 With recent hospitalization. Hospital notes, labs, imaging reviewed.  Continue metoprolol to tartrate 12.5 mg twice daily. Complete Holter monitor as ordered.

## 2024-07-17 NOTE — Assessment & Plan Note (Signed)
 Controlled.  Continue lisinopril 5 mg daily, metoprolol tartrate 12.5 mg BID, furosemide  40 mg daily.

## 2024-07-23 ENCOUNTER — Other Ambulatory Visit: Payer: Self-pay | Admitting: Primary Care

## 2024-07-23 DIAGNOSIS — K219 Gastro-esophageal reflux disease without esophagitis: Secondary | ICD-10-CM

## 2024-07-24 ENCOUNTER — Other Ambulatory Visit: Payer: Self-pay

## 2024-07-24 ENCOUNTER — Other Ambulatory Visit (HOSPITAL_COMMUNITY): Payer: Self-pay

## 2024-07-24 DIAGNOSIS — R600 Localized edema: Secondary | ICD-10-CM

## 2024-07-24 MED ORDER — OMEPRAZOLE 20 MG PO CPDR
20.0000 mg | DELAYED_RELEASE_CAPSULE | Freq: Two times a day (BID) | ORAL | 1 refills | Status: AC
  Filled 2024-07-24: qty 180, 90d supply, fill #0
  Filled 2024-10-27: qty 180, 90d supply, fill #1

## 2024-07-25 ENCOUNTER — Other Ambulatory Visit: Payer: Self-pay

## 2024-07-26 ENCOUNTER — Other Ambulatory Visit: Payer: Self-pay

## 2024-07-26 ENCOUNTER — Other Ambulatory Visit (HOSPITAL_BASED_OUTPATIENT_CLINIC_OR_DEPARTMENT_OTHER): Payer: Self-pay

## 2024-07-26 MED ORDER — FUROSEMIDE 40 MG PO TABS
40.0000 mg | ORAL_TABLET | Freq: Two times a day (BID) | ORAL | 0 refills | Status: DC
  Filled 2024-07-26: qty 60, 30d supply, fill #0

## 2024-07-26 NOTE — Progress Notes (Unsigned)
 [facility] OFFICE PROGRESS NOTE  Patient Care Team: Gretta Comer POUR, NP as PCP - General (Internal Medicine) Darliss Rogue, MD as PCP - Cardiology (Cardiology) Vertell Pont, RN as Oncology Nurse Navigator Charmayne Molly, MD as Consulting Physician (Ophthalmology)  Preston Thomas is a 73 y.o.male with history of HTN, HLD, DM, OSA [facility] cancer.    Current diagnosis: M1 HSPC Initial diagnosis: cT1cN0M0 PSA 12.7, GG5 (GRADES 4 + 5)  Germline testing: Negative genetic testing on the CancerNext-Expanded+RNAinsight panel. 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    Somatic testing: not yet Treatment: initial treatment: 05/2019 IMRT + brachytherapy + ADT. Received final leuprolide  injection 02/10/2021  Recurrence 08/2023 PET: Three very small lymph nodes at the level of the aortic bifurcation with intense radiotracer activity most consistent with prostate cancer nodal metastasis. One small node in the LEFT perirectal fat at 5 o'clock location in relation to the rectum is indeterminate but suspicious. 09/15/23 started AAP. Pre-AAP PSA 0.9.    03/06/24-03/17/24 radiation to pelvic nodal metastases   Patient has more fatigue after radiation.  Clinically not completely resolved.  He is trying to exercise but still has shortness of breath and dyspnea.  Will try to change ADT to Orgovyx .   His DEXA scan showed osteoporosis at the right femoral neck.  Discussed starting bisphosphonate.  He has upcoming dental appointment next month.  He has no dental pain or upcoming procedures.  Will plan on end of October if there is no dental issue.  Recent SVT, required adenosine and hospitalization.  Stop AAP.  Will monitor PSA at this time.  If rising PSA, we will start darolutamide.  Discussed potential side effects. Assessment & Plan Hormone sensitive prostate cancer (HCC) Stopped AAP. Continue max CV risk factor modification Will change Lupron  to Orgovyx . Last  ADT with Lupron  on 7/24  Return about 12/12 with labs a few days before visit. Other osteoporosis without current pathological fracture Right femoral neck -2.7.   History of ADT and prostate cancer Report no dental concerns after seeing dentist. Will start Zometa 4 mg tomorrow and then every 6 months At risk for side effect of medication Osteoporosis of right femoral neck  calcium  (1000-1200 mg daily from food and supplements) and vitamin D3 (1000 IU daily) Routine dental care Control and prevent diabetes Weight-bearing exercises (30 minutes per day) Limit alcohol consumption and avoid smoking Hypokalemia Continue potassium chloride  to 20 mEq daily  Orders Placed This Encounter  Procedures   CBC with Differential (Cancer Center Only)    Standing Status:   Future    Expiration Date:   07/27/2025   CMP (Cancer Center only)    Standing Status:   Future    Expiration Date:   07/27/2025   PSA    Standing Status:   Future    Expiration Date:   07/27/2025   Testosterone     Standing Status:   Future    Expiration Date:   07/27/2025     [name], MD  INTERVAL HISTORY: Patient returns for follow-up.  Oncology History  Hormone sensitive prostate cancer (HCC)  11/25/2018 Pathology Results   Prostate biopsy diagnosis:  A. BENIGN PROSTATE TISSUE. NO EVIDENCE OF MALIGNANCY.   B.  PROSTATIC ADENOCARCINOMA. GLEASON'S SCORE 6 (GRADES 3 + 3) NOTED IN 1  OUT OF 1 SUBMITTED PROSTATE CORE SEGMENTS. APPROXIMATELY 60% OF SUBMITTED  TISSUE INVOLVED.. (GRADE GROUP 1) PERINEURAL INVASION IS PRESENT.   C.  PROSTATIC ADENOCARCINOMA. GLEASON'S SCORE  6 (GRADES 3 + 3) NOTED IN 1  OUT OF 1 SUBMITTED PROSTATE CORE SEGMENTS. APPROXIMATELY 45% OF SUBMITTED  TISSUE INVOLVED.. (GRADE GROUP 1)   D.  BENIGN CONNECTIVE TISSUE AND SMOOTH MUSCLE. NO PROSTATE TISSUE SEEN.   E.  PROSTATIC ADENOCARCINOMA. GLEASON'S SCORE 8 (GRADES 3 + 5) NOTED IN 1  OUT OF 1 SUBMITTED PROSTATE CORE SEGMENTS.  APPROXIMATELY 91% OF SUBMITTED  TISSUE INVOLVED. (GRADE GROUP 4) PERINEURAL INVASION IS PRESENT.   F.  PROSTATIC ADENOCARCINOMA. GLEASON'S SCORE 7 (GRADES 3 + 4) NOTED IN 1  OUT OF 1 SUBMITTED PROSTATE CORE SEGMENTS. APPROXIMATELY 85% OF SUBMITTED  TISSUE INVOLVED.SABRA GLEASON GRADE 4 COMPRISES 30% OF THE TUMOR. (GRADE GROUP  2) PERINEURAL INVASION IS PRESENT.   G.  PROSTATIC ADENOCARCINOMA. GLEASON'S SCORE 6 (GRADES 3 + 3) NOTED IN 1  OUT OF 2 SUBMITTED PROSTATE CORE SEGMENTS. APPROXIMATELY 9% OF SUBMITTED  TISSUE INVOLVED.. (GRADE GROUP 1)   H.  PROSTATIC ADENOCARCINOMA. GLEASON'S SCORE 6 (GRADES 3 + 3) NOTED IN 1  OUT OF 1 SUBMITTED PROSTATE CORE SEGMENTS. APPROXIMATELY 13% OF SUBMITTED  TISSUE INVOLVED.. (GRADE GROUP 1) PERINEURAL INVASION IS PRESENT.   I.  PROSTATIC ADENOCARCINOMA. GLEASON'S SCORE 7 (GRADES 4 + 3) NOTED IN 2  OUT OF 2 SUBMITTED PROSTATE CORE SEGMENTS. APPROXIMATELY 27% OF SUBMITTED  TISSUE INVOLVED.. (GRADE GROUP 3) GLEASON GRADE 4 COMPRISES 70% OF THE  TUMOR.   J. PROSTATIC ADENOCARCINOMA. GLEASON'S SCORE 9 (GRADES 4 + 5) NOTED IN 1  OUT OF 2 SUBMITTED PROSTATE CORE SEGMENTS. APPROXIMATELY 73% OF SUBMITTED  TISSUE INVOLVED.. (GRADE GROUP 5) PERINEURAL INVASION IS PRESENT.   K.  PROSTATIC ADENOCARCINOMA. GLEASON'S SCORE 8 (GRADES 3 + 5) NOTED IN 1  OUT OF 1 SUBMITTED PROSTATE CORE SEGMENTS. APPROXIMATELY 100% OF SUBMITTED  TISSUE INVOLVED.. (GRADE GROUP 4) PERINEURAL INVASION IS PRESENT.   L.  PROSTATIC ADENOCARCINOMA. GLEASON'S SCORE 7 (GRADES 4 + 3) NOTED IN 1  OUT OF 1 SUBMITTED PROSTATE CORE SEGMENTS. APPROXIMATELY 66% OF SUBMITTED  TISSUE INVOLVED.. (GRADE GROUP 3) GLEASON GRADE 4 COMPRISES 60% OF THE  TUMOR.    08/19/2023 PET scan   PSMA PET IMPRESSION: 1. No evidence of recurrence in the prostate gland. 2. Three very small lymph nodes at the level of the aortic bifurcation with intense radiotracer activity most consistent with prostate cancer nodal  metastasis. 3. One small node in the LEFT perirectal fat at 5 o'clock location in relation to teh rectum is indeterminate but suspicious. 4. No more distant metastatic nodal disease. No visceral metastasis or skeletal metastasis.   09/07/2023 Initial Diagnosis   Hormone sensitive prostate cancer (HCC)    Tumor Marker   PSA 08/18/18 11.54 11/11/18 12.7 10/23/19 0.29 02/19/20 0.3 04/22/20 0.15 09/02/20 0.11 04/23/21 0.04 11/28/21 <0.1 04/24/22 0.11 11/30/22 0.4 06/16/23 0.7 08/06/23 0.9   10/12/2023 Cancer Staging   Staging form: Prostate, AJCC 8th Edition - Clinical: Stage IVB (cTX, cN1, cM1a, Grade Group: 5) - Signed by Tina Pauletta BROCKS, MD on 10/12/2023 Histologic grading system: 5 grade system   02/07/2024 Genetic Testing   Negative genetic testing on the CancerNext-Expanded+RNAinsight panel.  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  The report date is Feb 07, 2024.  The CancerNext-Expanded gene panel offered by [facility] and includes sequencing, rearrangement, and RNA analysis for the following 77 genes: AIP, ALK, APC, ATM, BAP1, BARD1, BMPR1A, BRCA1, BRCA2, BRIP1, CDC73, CDH1, CDK4, CDKN1B, CDKN2A, CEBPA, CHEK2, CTNNA1, DDX41, DICER1, ETV6, FH, FLCN, GATA2, LZTR1, MAX, MBD4, MEN1, MET, MLH1,  MSH2, MSH3, MSH6, MUTYH, NF1, NF2, NTHL1, PALB2, PHOX2B, PMS2, POT1, PRKAR1A, PTCH1, PTEN, RAD51C, RAD51D, RB1, RET, RPS20, RUNX1, SDHA, SDHAF2, SDHB, SDHC, SDHD, SMAD4, SMARCA4, SMARCB1, SMARCE1, STK11, SUFU, TMEM127, TP53, TSC1, TSC2, VHL, and WT1 (sequencing and deletion/duplication); AXIN2, CTNNA1, DDX41, EGFR, HOXB13, KIT, MBD4, MITF, MSH3, PDGFRA, POLD1 and POLE (sequencing only); EPCAM and GREM1 (deletion/duplication only). RNA data is routinely analyzed for use in variant interpretation for all genes.      PHYSICAL EXAMINATION: ECOG PERFORMANCE STATUS: 1  Vitals:   07/27/24 1545  BP: 100/70  Pulse: 78  Resp: 20  Temp: (!) 97.2 F (36.2 C)  SpO2: 99%   Filed Weights   07/27/24 1545   Weight: 207 lb 3.2 oz (94 kg)    GENERAL: alert, no distress and comfortable SKIN: skin color normal and no jaundice  NECK: No palpable mass LYMPH:  no palpable cervical, axillary lymphadenopathy  LUNGS: clear to auscultation and no wheeze or rales with normal breathing effort HEART: regular rate & rhythm  ABDOMEN: abdomen soft, non-tender and nondistended. Musculoskeletal: no edema   Relevant data reviewed during this visit included labs.  New labs ordered.

## 2024-07-26 NOTE — Assessment & Plan Note (Addendum)
 Stopped AAP. Continue max CV risk factor modification Will change Lupron  to Orgovyx . Last ADT with Lupron  on 7/24  Return about 12/12 with labs a few days before visit.

## 2024-07-27 ENCOUNTER — Inpatient Hospital Stay (HOSPITAL_BASED_OUTPATIENT_CLINIC_OR_DEPARTMENT_OTHER)

## 2024-07-27 ENCOUNTER — Inpatient Hospital Stay

## 2024-07-27 VITALS — BP 100/70 | HR 78 | Temp 97.2°F | Resp 20 | Wt 207.2 lb

## 2024-07-27 DIAGNOSIS — Z79899 Other long term (current) drug therapy: Secondary | ICD-10-CM | POA: Insufficient documentation

## 2024-07-27 DIAGNOSIS — M818 Other osteoporosis without current pathological fracture: Secondary | ICD-10-CM | POA: Insufficient documentation

## 2024-07-27 DIAGNOSIS — E876 Hypokalemia: Secondary | ICD-10-CM

## 2024-07-27 DIAGNOSIS — C61 Malignant neoplasm of prostate: Secondary | ICD-10-CM | POA: Diagnosis not present

## 2024-07-27 DIAGNOSIS — C779 Secondary and unspecified malignant neoplasm of lymph node, unspecified: Secondary | ICD-10-CM | POA: Insufficient documentation

## 2024-07-27 DIAGNOSIS — Z9189 Other specified personal risk factors, not elsewhere classified: Secondary | ICD-10-CM

## 2024-07-27 DIAGNOSIS — E231 Drug-induced hypopituitarism: Secondary | ICD-10-CM | POA: Diagnosis not present

## 2024-07-27 DIAGNOSIS — Z191 Hormone sensitive malignancy status: Secondary | ICD-10-CM | POA: Diagnosis not present

## 2024-07-27 LAB — CBC WITH DIFFERENTIAL (CANCER CENTER ONLY)
Abs Immature Granulocytes: 0.06 K/uL (ref 0.00–0.07)
Basophils Absolute: 0.1 K/uL (ref 0.0–0.1)
Basophils Relative: 1 %
Eosinophils Absolute: 0.3 K/uL (ref 0.0–0.5)
Eosinophils Relative: 4 %
HCT: 39.7 % (ref 39.0–52.0)
Hemoglobin: 13.3 g/dL (ref 13.0–17.0)
Immature Granulocytes: 1 %
Lymphocytes Relative: 14 %
Lymphs Abs: 1.2 K/uL (ref 0.7–4.0)
MCH: 29.8 pg (ref 26.0–34.0)
MCHC: 33.5 g/dL (ref 30.0–36.0)
MCV: 88.8 fL (ref 80.0–100.0)
Monocytes Absolute: 0.8 K/uL (ref 0.1–1.0)
Monocytes Relative: 9 %
Neutro Abs: 6.2 K/uL (ref 1.7–7.7)
Neutrophils Relative %: 71 %
Platelet Count: 299 K/uL (ref 150–400)
RBC: 4.47 MIL/uL (ref 4.22–5.81)
RDW: 13.3 % (ref 11.5–15.5)
WBC Count: 8.7 K/uL (ref 4.0–10.5)
nRBC: 0 % (ref 0.0–0.2)

## 2024-07-27 LAB — CMP (CANCER CENTER ONLY)
ALT: 32 U/L (ref 0–44)
AST: 16 U/L (ref 15–41)
Albumin: 4 g/dL (ref 3.5–5.0)
Alkaline Phosphatase: 61 U/L (ref 38–126)
Anion gap: 7 (ref 5–15)
BUN: 23 mg/dL (ref 8–23)
CO2: 28 mmol/L (ref 22–32)
Calcium: 9.5 mg/dL (ref 8.9–10.3)
Chloride: 111 mmol/L (ref 98–111)
Creatinine: 1.2 mg/dL (ref 0.61–1.24)
GFR, Estimated: >60 mL/min (ref >60)
Glucose, Bld: 108 mg/dL — ABNORMAL HIGH (ref 70–99)
Potassium: 4 mmol/L (ref 3.5–5.1)
Sodium: 146 mmol/L — ABNORMAL HIGH (ref 135–145)
Total Bilirubin: 0.2 mg/dL (ref 0.0–1.2)
Total Protein: 6.9 g/dL (ref 6.5–8.1)

## 2024-07-27 LAB — PSA: Prostatic Specific Antigen: <0.02 ng/mL (ref 0.00–4.00)

## 2024-07-27 NOTE — Assessment & Plan Note (Addendum)
 Right femoral neck -2.7.   History of ADT and prostate cancer Report no dental concerns after seeing dentist. Will start Zometa 4 mg tomorrow and then every 6 months

## 2024-07-27 NOTE — Assessment & Plan Note (Addendum)
Continue potassium chloride to 20 mEq daily.  

## 2024-07-27 NOTE — Assessment & Plan Note (Addendum)
 Osteoporosis of right femoral neck  calcium  (1000-1200 mg daily from food and supplements) and vitamin D3 (1000 IU daily) Routine dental care Control and prevent diabetes Weight-bearing exercises (30 minutes per day) Limit alcohol consumption and avoid smoking

## 2024-07-28 ENCOUNTER — Ambulatory Visit: Payer: Self-pay

## 2024-07-28 ENCOUNTER — Other Ambulatory Visit: Payer: Self-pay

## 2024-07-28 ENCOUNTER — Inpatient Hospital Stay

## 2024-07-28 VITALS — BP 122/74 | HR 73 | Temp 98.7°F | Resp 13

## 2024-07-28 DIAGNOSIS — M816 Localized osteoporosis [Lequesne]: Secondary | ICD-10-CM

## 2024-07-28 DIAGNOSIS — M818 Other osteoporosis without current pathological fracture: Secondary | ICD-10-CM | POA: Diagnosis not present

## 2024-07-28 LAB — TESTOSTERONE: Testosterone: <3 ng/dL — ABNORMAL LOW (ref 264–916)

## 2024-07-28 MED ORDER — SODIUM CHLORIDE 0.9 % IV SOLN: INTRAVENOUS | Status: DC

## 2024-07-28 MED ORDER — ZOLEDRONIC ACID 4 MG/100ML IV SOLN
4.0000 mg | Freq: Once | INTRAVENOUS | Status: AC
  Administered 2024-07-28: 4 mg via INTRAVENOUS
  Filled 2024-07-28: qty 100

## 2024-07-28 NOTE — Progress Notes (Signed)
 Per OV 10/30, stopped abiraterone  due to recent SVT requiring adenosine and hospitalization.

## 2024-07-28 NOTE — Telephone Encounter (Signed)
 Oncology Pharmacist Encounter  Discussed with MD that we will wait to start Orgovyx  until patients next appointment to see how patient is doing prior to starting. Will time-out until patients next appt with MD in December.   Alenah Sarria, PharmD Hematology/Oncology Clinical Pharmacist [facility] [phone]

## 2024-07-28 NOTE — Patient Instructions (Signed)

## 2024-07-31 ENCOUNTER — Telehealth: Payer: Self-pay

## 2024-07-31 NOTE — Telephone Encounter (Signed)
 Scheduled patient for next appointments. Called and left a voicemail with the details.

## 2024-08-02 DIAGNOSIS — I471 Supraventricular tachycardia, unspecified: Secondary | ICD-10-CM | POA: Diagnosis not present

## 2024-08-03 ENCOUNTER — Other Ambulatory Visit (HOSPITAL_COMMUNITY): Payer: Self-pay

## 2024-08-03 ENCOUNTER — Other Ambulatory Visit: Payer: Self-pay

## 2024-08-03 ENCOUNTER — Ambulatory Visit: Payer: Self-pay | Admitting: Cardiology

## 2024-08-03 DIAGNOSIS — I471 Supraventricular tachycardia, unspecified: Secondary | ICD-10-CM | POA: Diagnosis not present

## 2024-08-03 MED ORDER — METOPROLOL TARTRATE 25 MG PO TABS
25.0000 mg | ORAL_TABLET | Freq: Two times a day (BID) | ORAL | 3 refills | Status: AC
  Filled 2024-08-03: qty 90, 45d supply, fill #0
  Filled 2024-09-15: qty 90, 45d supply, fill #1
  Filled 2024-10-27: qty 180, 90d supply, fill #2

## 2024-08-04 DIAGNOSIS — I10 Essential (primary) hypertension: Secondary | ICD-10-CM

## 2024-08-04 MED ORDER — LISINOPRIL 5 MG PO TABS
5.0000 mg | ORAL_TABLET | Freq: Every day | ORAL | 2 refills | Status: AC
  Filled 2024-08-04: qty 90, 90d supply, fill #0
  Filled 2024-10-27: qty 90, 90d supply, fill #1

## 2024-08-05 ENCOUNTER — Other Ambulatory Visit (HOSPITAL_COMMUNITY): Payer: Self-pay

## 2024-08-07 ENCOUNTER — Other Ambulatory Visit (HOSPITAL_COMMUNITY): Payer: Self-pay

## 2024-08-25 ENCOUNTER — Other Ambulatory Visit: Payer: Self-pay

## 2024-08-25 ENCOUNTER — Other Ambulatory Visit (HOSPITAL_COMMUNITY): Payer: Self-pay

## 2024-08-25 ENCOUNTER — Other Ambulatory Visit: Payer: Self-pay | Admitting: Primary Care

## 2024-08-25 DIAGNOSIS — R600 Localized edema: Secondary | ICD-10-CM

## 2024-08-25 MED ORDER — FUROSEMIDE 40 MG PO TABS
40.0000 mg | ORAL_TABLET | Freq: Two times a day (BID) | ORAL | 0 refills | Status: DC
  Filled 2024-08-25: qty 180, 90d supply, fill #0

## 2024-08-26 ENCOUNTER — Other Ambulatory Visit (HOSPITAL_COMMUNITY): Payer: Self-pay

## 2024-09-06 ENCOUNTER — Inpatient Hospital Stay

## 2024-09-06 DIAGNOSIS — C775 Secondary and unspecified malignant neoplasm of intrapelvic lymph nodes: Secondary | ICD-10-CM | POA: Diagnosis present

## 2024-09-06 DIAGNOSIS — Z79899 Other long term (current) drug therapy: Secondary | ICD-10-CM | POA: Insufficient documentation

## 2024-09-06 DIAGNOSIS — C61 Malignant neoplasm of prostate: Secondary | ICD-10-CM | POA: Diagnosis present

## 2024-09-06 DIAGNOSIS — Z79818 Long term (current) use of other agents affecting estrogen receptors and estrogen levels: Secondary | ICD-10-CM | POA: Diagnosis not present

## 2024-09-06 DIAGNOSIS — E876 Hypokalemia: Secondary | ICD-10-CM | POA: Insufficient documentation

## 2024-09-06 LAB — CBC WITH DIFFERENTIAL (CANCER CENTER ONLY)
Abs Immature Granulocytes: 0.07 K/uL (ref 0.00–0.07)
Basophils Absolute: 0.1 K/uL (ref 0.0–0.1)
Basophils Relative: 1 %
Eosinophils Absolute: 0.5 K/uL (ref 0.0–0.5)
Eosinophils Relative: 5 %
HCT: 39.8 % (ref 39.0–52.0)
Hemoglobin: 13.5 g/dL (ref 13.0–17.0)
Immature Granulocytes: 1 %
Lymphocytes Relative: 15 %
Lymphs Abs: 1.3 K/uL (ref 0.7–4.0)
MCH: 30.1 pg (ref 26.0–34.0)
MCHC: 33.9 g/dL (ref 30.0–36.0)
MCV: 88.8 fL (ref 80.0–100.0)
Monocytes Absolute: 0.9 K/uL (ref 0.1–1.0)
Monocytes Relative: 10 %
Neutro Abs: 6.2 K/uL (ref 1.7–7.7)
Neutrophils Relative %: 68 %
Platelet Count: 354 K/uL (ref 150–400)
RBC: 4.48 MIL/uL (ref 4.22–5.81)
RDW: 14.3 % (ref 11.5–15.5)
WBC Count: 9.1 K/uL (ref 4.0–10.5)
nRBC: 0 % (ref 0.0–0.2)

## 2024-09-06 LAB — CMP (CANCER CENTER ONLY)
ALT: 29 U/L (ref 0–44)
AST: 22 U/L (ref 15–41)
Albumin: 4.2 g/dL (ref 3.5–5.0)
Alkaline Phosphatase: 62 U/L (ref 38–126)
Anion gap: 12 (ref 5–15)
BUN: 21 mg/dL (ref 8–23)
CO2: 28 mmol/L (ref 22–32)
Calcium: 8.8 mg/dL — ABNORMAL LOW (ref 8.9–10.3)
Chloride: 106 mmol/L (ref 98–111)
Creatinine: 1.1 mg/dL (ref 0.61–1.24)
GFR, Estimated: >60 mL/min (ref >60)
Glucose, Bld: 101 mg/dL — ABNORMAL HIGH (ref 70–99)
Potassium: 3.9 mmol/L (ref 3.5–5.1)
Sodium: 146 mmol/L — ABNORMAL HIGH (ref 135–145)
Total Bilirubin: 0.3 mg/dL (ref 0.0–1.2)
Total Protein: 7.1 g/dL (ref 6.5–8.1)

## 2024-09-06 LAB — PSA: Prostatic Specific Antigen: <0.02 ng/mL (ref 0.00–4.00)

## 2024-09-07 ENCOUNTER — Ambulatory Visit: Payer: Self-pay

## 2024-09-07 LAB — TESTOSTERONE: Testosterone: <3 ng/dL — ABNORMAL LOW (ref 264–916)

## 2024-09-07 NOTE — Progress Notes (Unsigned)
 [facility] OFFICE PROGRESS NOTE  Patient Care Team: Gretta Comer POUR, NP as PCP - General (Internal Medicine) Darliss Rogue, MD as PCP - Cardiology (Cardiology) Kennyth Chew, MD as PCP - Electrophysiology (Cardiology) Vertell Pont, RN as Oncology Nurse Navigator Charmayne Molly, MD as Consulting Physician (Ophthalmology)  Preston Thomas is a 73 y.o.male with history of HTN, HLD, DM, OSA being seen at [facility] for prostate cancer.    Current diagnosis: M1 HSPC Initial diagnosis: cT1cN0M0 PSA 12.7, GG5 (GRADES 4 + 5)  Germline testing: Negative genetic testing on the CancerNext-Expanded+RNAinsight panel. 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    Somatic testing: not yet Treatment: initial treatment: 05/2019 IMRT + brachytherapy + ADT. Received final leuprolide  injection 02/10/2021  Recurrence 08/2023 PET: Three very small lymph nodes at the level of the aortic bifurcation with intense radiotracer activity most consistent with prostate cancer nodal metastasis. One small node in the LEFT perirectal fat at 5 o'clock location in relation to the rectum is indeterminate but suspicious. 09/15/23 started AAP. Pre-AAP PSA 0.9.   03/06/24-03/17/24 radiation to pelvic nodal metastases  Clinically feeling better. No signs of concerns.  Tolerating zometa .  Assessment & Plan Hormone sensitive prostate cancer (HCC) Continue monitor PSA Continue max CV risk factor modification Will hold treatment for now  Return on 2/12 with labs and see me on 2/13 Hypokalemia Will resume potassium chloride  at 20 mEq twice daily given he is taking Lasix  40 mg twice daily He will inquire cardiology if dose need to be adjusted At risk for side effect of medication Osteoporosis of right femoral neck  calcium  (1000-1200 mg daily from food and supplements) and vitamin D3 (1000 IU daily) Routine dental care Zometa  on 10/31. Will need it at the end of April Control and prevent  diabetes Weight-bearing exercises (30 minutes per day) Limit alcohol consumption and avoid smoking  Orders Placed This Encounter  Procedures   CBC with Differential (Cancer Center Only)    Standing Status:   Future    Expiration Date:   09/08/2025   CMP (Cancer Center only)    Standing Status:   Future    Expiration Date:   09/08/2025   Testosterone     Standing Status:   Future    Expiration Date:   09/08/2025   PSA    Standing Status:   Future    Expiration Date:   09/08/2025     Preston JAYSON Chihuahua, MD  INTERVAL HISTORY: Patient returns for follow-up. He feels well. Less fatigue. He is still on Lasix . No trouble urinating. No palpitation or chest pain. He is able to exercise. No coughing or short of breath.  Oncology History  Hormone sensitive prostate cancer (HCC)  11/25/2018 Pathology Results   Prostate biopsy diagnosis:  A. BENIGN PROSTATE TISSUE. NO EVIDENCE OF MALIGNANCY.   B.  PROSTATIC ADENOCARCINOMA. GLEASON'S SCORE 6 (GRADES 3 + 3) NOTED IN 1  OUT OF 1 SUBMITTED PROSTATE CORE SEGMENTS. APPROXIMATELY 60% OF SUBMITTED  TISSUE INVOLVED.. (GRADE GROUP 1) PERINEURAL INVASION IS PRESENT.   C.  PROSTATIC ADENOCARCINOMA. GLEASON'S SCORE 6 (GRADES 3 + 3) NOTED IN 1  OUT OF 1 SUBMITTED PROSTATE CORE SEGMENTS. APPROXIMATELY 45% OF SUBMITTED  TISSUE INVOLVED.. (GRADE GROUP 1)   D.  BENIGN CONNECTIVE TISSUE AND SMOOTH MUSCLE. NO PROSTATE TISSUE SEEN.   E.  PROSTATIC ADENOCARCINOMA. GLEASON'S SCORE 8 (GRADES 3 + 5) NOTED IN 1  OUT OF 1 SUBMITTED PROSTATE CORE SEGMENTS. APPROXIMATELY 91% OF SUBMITTED  TISSUE INVOLVED. (GRADE  GROUP 4) PERINEURAL INVASION IS PRESENT.   F.  PROSTATIC ADENOCARCINOMA. GLEASON'S SCORE 7 (GRADES 3 + 4) NOTED IN 1  OUT OF 1 SUBMITTED PROSTATE CORE SEGMENTS. APPROXIMATELY 85% OF SUBMITTED  TISSUE INVOLVED.SABRA GLEASON GRADE 4 COMPRISES 30% OF THE TUMOR. (GRADE GROUP  2) PERINEURAL INVASION IS PRESENT.   G.  PROSTATIC ADENOCARCINOMA. GLEASON'S SCORE 6  (GRADES 3 + 3) NOTED IN 1  OUT OF 2 SUBMITTED PROSTATE CORE SEGMENTS. APPROXIMATELY 9% OF SUBMITTED  TISSUE INVOLVED.. (GRADE GROUP 1)   H.  PROSTATIC ADENOCARCINOMA. GLEASON'S SCORE 6 (GRADES 3 + 3) NOTED IN 1  OUT OF 1 SUBMITTED PROSTATE CORE SEGMENTS. APPROXIMATELY 13% OF SUBMITTED  TISSUE INVOLVED.. (GRADE GROUP 1) PERINEURAL INVASION IS PRESENT.   I.  PROSTATIC ADENOCARCINOMA. GLEASON'S SCORE 7 (GRADES 4 + 3) NOTED IN 2  OUT OF 2 SUBMITTED PROSTATE CORE SEGMENTS. APPROXIMATELY 27% OF SUBMITTED  TISSUE INVOLVED.. (GRADE GROUP 3) GLEASON GRADE 4 COMPRISES 70% OF THE  TUMOR.   J. PROSTATIC ADENOCARCINOMA. GLEASON'S SCORE 9 (GRADES 4 + 5) NOTED IN 1  OUT OF 2 SUBMITTED PROSTATE CORE SEGMENTS. APPROXIMATELY 73% OF SUBMITTED  TISSUE INVOLVED.. (GRADE GROUP 5) PERINEURAL INVASION IS PRESENT.   K.  PROSTATIC ADENOCARCINOMA. GLEASON'S SCORE 8 (GRADES 3 + 5) NOTED IN 1  OUT OF 1 SUBMITTED PROSTATE CORE SEGMENTS. APPROXIMATELY 100% OF SUBMITTED  TISSUE INVOLVED.. (GRADE GROUP 4) PERINEURAL INVASION IS PRESENT.   L.  PROSTATIC ADENOCARCINOMA. GLEASON'S SCORE 7 (GRADES 4 + 3) NOTED IN 1  OUT OF 1 SUBMITTED PROSTATE CORE SEGMENTS. APPROXIMATELY 66% OF SUBMITTED  TISSUE INVOLVED.. (GRADE GROUP 3) GLEASON GRADE 4 COMPRISES 60% OF THE  TUMOR.    08/19/2023 PET scan   PSMA PET IMPRESSION: 1. No evidence of recurrence in the prostate gland. 2. Three very small lymph nodes at the level of the aortic bifurcation with intense radiotracer activity most consistent with prostate cancer nodal metastasis. 3. One small node in the LEFT perirectal fat at 5 o'clock location in relation to teh rectum is indeterminate but suspicious. 4. No more distant metastatic nodal disease. No visceral metastasis or skeletal metastasis.   09/07/2023 Initial Diagnosis   Hormone sensitive prostate cancer (HCC)    Tumor Marker   PSA 08/18/18 11.54 11/11/18 12.7 10/23/19 0.29 02/19/20 0.3 04/22/20 0.15 09/02/20  0.11 04/23/21 0.04 11/28/21 <0.1 04/24/22 0.11 11/30/22 0.4 06/16/23 0.7 08/06/23 0.9   10/12/2023 Cancer Staging   Staging form: Prostate, AJCC 8th Edition - Clinical: Stage IVB (cTX, cN1, cM1a, Grade Group: 5) - Signed by Tina Preston BROCKS, MD on 10/12/2023 Histologic grading system: 5 grade system   02/07/2024 Genetic Testing   Negative genetic testing on the CancerNext-Expanded+RNAinsight panel.  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  The report date is Feb 07, 2024.  The CancerNext-Expanded gene panel offered by [facility] and includes sequencing, rearrangement, and RNA analysis for the following 77 genes: AIP, ALK, APC, ATM, BAP1, BARD1, BMPR1A, BRCA1, BRCA2, BRIP1, CDC73, CDH1, CDK4, CDKN1B, CDKN2A, CEBPA, CHEK2, CTNNA1, DDX41, DICER1, ETV6, FH, FLCN, GATA2, LZTR1, MAX, MBD4, MEN1, MET, MLH1, MSH2, MSH3, MSH6, MUTYH, NF1, NF2, NTHL1, PALB2, PHOX2B, PMS2, POT1, PRKAR1A, PTCH1, PTEN, RAD51C, RAD51D, RB1, RET, RPS20, RUNX1, SDHA, SDHAF2, SDHB, SDHC, SDHD, SMAD4, SMARCA4, SMARCB1, SMARCE1, STK11, SUFU, TMEM127, TP53, TSC1, TSC2, VHL, and WT1 (sequencing and deletion/duplication); AXIN2, CTNNA1, DDX41, EGFR, HOXB13, KIT, MBD4, MITF, MSH3, PDGFRA, POLD1 and POLE (sequencing only); EPCAM and GREM1 (deletion/duplication only). RNA data is routinely analyzed for use in variant interpretation for  all genes.      PHYSICAL EXAMINATION: ECOG PERFORMANCE STATUS: 1  Vitals:   09/08/24 1430  BP: (!) 109/54  Pulse: 76  Resp: 17  Temp: (!) 97.2 F (36.2 C)  SpO2: 96%   Filed Weights   09/08/24 1430  Weight: 214 lb (97.1 kg)    GENERAL: alert, no distress and comfortable SKIN: skin color normal and no jaundice or bruising or petechiae on exposed skin EYES: normal, sclera clear LUNGS: clear to auscultation and no wheeze or rales with normal breathing effort HEART: regular rate & rhythm  ABDOMEN: abdomen soft, non-tender and nondistended. Musculoskeletal: no edema  Relevant data reviewed during  this visit included labs.  New labs ordered.

## 2024-09-08 ENCOUNTER — Other Ambulatory Visit (HOSPITAL_COMMUNITY): Payer: Self-pay

## 2024-09-08 ENCOUNTER — Other Ambulatory Visit: Payer: Self-pay

## 2024-09-08 ENCOUNTER — Inpatient Hospital Stay

## 2024-09-08 VITALS — BP 109/54 | HR 76 | Temp 97.2°F | Resp 17 | Ht 59.0 in | Wt 214.0 lb

## 2024-09-08 DIAGNOSIS — E876 Hypokalemia: Secondary | ICD-10-CM

## 2024-09-08 DIAGNOSIS — C775 Secondary and unspecified malignant neoplasm of intrapelvic lymph nodes: Secondary | ICD-10-CM | POA: Diagnosis not present

## 2024-09-08 DIAGNOSIS — Z9189 Other specified personal risk factors, not elsewhere classified: Secondary | ICD-10-CM

## 2024-09-08 DIAGNOSIS — Z191 Hormone sensitive malignancy status: Secondary | ICD-10-CM | POA: Diagnosis not present

## 2024-09-08 DIAGNOSIS — C61 Malignant neoplasm of prostate: Secondary | ICD-10-CM

## 2024-09-08 MED ORDER — POTASSIUM CHLORIDE CRYS ER 10 MEQ PO TBCR
20.0000 meq | EXTENDED_RELEASE_TABLET | Freq: Two times a day (BID) | ORAL | 2 refills | Status: AC
  Filled 2024-09-08: qty 120, 30d supply, fill #0

## 2024-09-08 NOTE — Assessment & Plan Note (Addendum)
 Osteoporosis of right femoral neck  calcium  (1000-1200 mg daily from food and supplements) and vitamin D3 (1000 IU daily) Routine dental care Zometa  on 10/31. Will need it at the end of April Control and prevent diabetes Weight-bearing exercises (30 minutes per day) Limit alcohol consumption and avoid smoking

## 2024-09-08 NOTE — Assessment & Plan Note (Addendum)
 Continue monitor PSA Continue max CV risk factor modification Will hold treatment for now  Return on 2/12 with labs and see me on 2/13

## 2024-09-08 NOTE — Assessment & Plan Note (Signed)
 Will resume potassium chloride  at 20 mEq twice daily given he is taking Lasix  40 mg twice daily He will inquire cardiology if dose need to be adjusted

## 2024-09-11 NOTE — Telephone Encounter (Signed)
 Oncology Pharmacist Encounter  Discussed with MD and since patient will need to be re-assessed in Feb 2026 at patients next appointment to determine if Orgovyx  can be started, MD is okay with us  removing medication from patients med list. MD will enter new rx if he decides patient should start therapy and at that point oncology pharmacy will be re-consulted.   Marili Vader, PharmD Hematology/Oncology Clinical Pharmacist [facility] [phone]

## 2024-09-15 ENCOUNTER — Other Ambulatory Visit (HOSPITAL_COMMUNITY): Payer: Self-pay

## 2024-09-18 ENCOUNTER — Ambulatory Visit: Payer: PPO

## 2024-09-18 VITALS — BP 116/63 | Ht 59.0 in | Wt 207.0 lb

## 2024-09-18 DIAGNOSIS — Z0000 Encounter for general adult medical examination without abnormal findings: Secondary | ICD-10-CM

## 2024-09-18 NOTE — Patient Instructions (Signed)
 Preston Thomas,  Thank you for taking the time for your Medicare Wellness Visit. I appreciate your continued commitment to your health goals. Please review the care plan we discussed, and feel free to reach out if I can assist you further.  Please note that Annual Wellness Visits do not include a physical exam. Some assessments may be limited, especially if the visit was conducted virtually. If needed, we may recommend an in-person follow-up with your provider.  Ongoing Care Seeing your primary care provider every 3 to 6 months helps us  monitor your health and provide consistent, personalized care.   Referrals If a referral was made during today's visit and you haven't received any updates within two weeks, please contact the referred provider directly to check on the status.  Recommended Screenings:  Health Maintenance  Topic Date Due   Zoster (Shingles) Vaccine (1 of 2) Never done   DTaP/Tdap/Td vaccine (2 - Tdap) 04/25/2013   Medicare Annual Wellness Visit  09/15/2024   Flu Shot  12/26/2024*   Colon Cancer Screening  09/28/2026   Pneumococcal Vaccine for age over 21  Completed   Hepatitis C Screening  Completed   Meningitis B Vaccine  Aged Out   COVID-19 Vaccine  Discontinued  *Topic was postponed. The date shown is not the original due date.       09/15/2024    9:40 AM  Advanced Directives  Does Patient Have a Medical Advance Directive? Yes  Type of Estate Agent of [place];Living will;Out of facility DNR (pink MOST or yellow form)  Does patient want to make changes to medical advance directive? No - Patient declined  Copy of Healthcare Power of Attorney in Chart? No - copy requested    Vision: Annual vision screenings are recommended for early detection of glaucoma, cataracts, and diabetic retinopathy. These exams can also reveal signs of chronic conditions such as diabetes and high blood pressure.  Dental: Annual dental screenings help detect early signs  of oral cancer, gum disease, and other conditions linked to overall health, including heart disease and diabetes.  Please see the attached documents for additional preventive care recommendations.

## 2024-09-18 NOTE — Progress Notes (Signed)
 " I connected with  Preston Thomas on 09/18/2024 by a video and audio enabled telemedicine application and verified that I am speaking with the correct person using two identifiers.  Patient Location: Home  Provider Location: Home Office  Persons Participating in Visit: Patient.  I discussed the limitations of evaluation and management by telemedicine. The patient expressed understanding and agreed to proceed.  Vital Signs: Because this visit was a virtual/telehealth visit, some criteria may be missing or patient reported. Any vitals not documented were not able to be obtained and vitals that have been documented are patient reported.   Chief Complaint  Patient presents with   Medicare Wellness     Subjective:   Preston Thomas is a 73 y.o. male who presents for a Medicare Annual Wellness Visit.  Visit info / Clinical Intake: Medicare Wellness Visit Type:: Subsequent Annual Wellness Visit Persons participating in visit and providing information:: patient Medicare Wellness Visit Mode:: Video Since this visit was completed virtually, some vitals may be partially provided or unavailable. Missing vitals are due to the limitations of the virtual format.: Documented vitals are patient reported If Telephone or Video please confirm:: I connected with patient using audio/video enable telemedicine. I verified patient identity with two identifiers, discussed telehealth limitations, and patient agreed to proceed. Patient Location:: home Provider Location:: home office Interpreter Needed?: No Pre-visit prep was completed: yes AWV questionnaire completed by patient prior to visit?: yes Date:: 09/15/24 Living arrangements:: (Patient-Rptd) lives with spouse/significant other Patient's Overall Health Status Rating: (Patient-Rptd) good Typical amount of pain: (Patient-Rptd) none Does pain affect daily life?: (Patient-Rptd) no Are you currently prescribed opioids?: no  Dietary  Habits and Nutritional Risks How many meals a day?: (Patient-Rptd) 3 Eats fruit and vegetables daily?: (Patient-Rptd) yes Most meals are obtained by: (Patient-Rptd) having others provide food In the last 2 weeks, have you had any of the following?: none Diabetic:: no  Functional Status Activities of Daily Living (to include ambulation/medication): (Patient-Rptd) Independent Ambulation: (Patient-Rptd) Independent Medication Administration: (Patient-Rptd) Independent Home Management (perform basic housework or laundry): (Patient-Rptd) Independent Manage your own finances?: (Patient-Rptd) yes Primary transportation is: (Patient-Rptd) driving Concerns about vision?: no *vision screening is required for WTM* Concerns about hearing?: no  Fall Screening Falls in the past year?: (Patient-Rptd) 0 Number of falls in past year: 0 Was there an injury with Fall?: 0 Fall Risk Category Calculator: 0 Patient Fall Risk Level: Low Fall Risk  Fall Risk Patient at Risk for Falls Due to: No Fall Risks Fall risk Follow up: Falls evaluation completed; Falls prevention discussed  Home and Transportation Safety: All rugs have non-skid backing?: (Patient-Rptd) yes All stairs or steps have railings?: (Patient-Rptd) N/A, no stairs Grab bars in the bathtub or shower?: (Patient-Rptd) yes Have non-skid surface in bathtub or shower?: (Patient-Rptd) yes Good home lighting?: (Patient-Rptd) yes Regular seat belt use?: (Patient-Rptd) yes Hospital stays in the last year:: (!) (Patient-Rptd) yes How many hospital stays:: (Patient-Rptd) 1  Cognitive Assessment Difficulty concentrating, remembering, or making decisions? : (Patient-Rptd) no Will 6CIT or Mini Cog be Completed: yes What year is it?: 0 points What month is it?: 0 points Give patient an address phrase to remember (5 components): [address] [place] About what time is it?: 0 points Count backwards from 20 to 1: 0 points Say the months of the  year in reverse: 0 points Repeat the address phrase from earlier: 0 points 6 CIT Score: 0 points  Advance Directives (For Healthcare) Does Patient  Have a Medical Advance Directive?: Yes Does patient want to make changes to medical advance directive?: No - Patient declined Type of Advance Directive: Healthcare Power of [place]; Living will; Out of facility DNR (pink MOST or yellow form) Copy of Healthcare Power of Attorney in Chart?: No - copy requested Copy of Living Will in Chart?: No - copy requested Out of facility DNR (pink MOST or yellow form) in Chart? (Ambulatory ONLY): No - copy requested  Reviewed/Updated  Reviewed/Updated: Reviewed All (Medical, Surgical, Family, Medications, Allergies, Care Teams, Patient Goals)    Allergies (verified) Penicillins   Current Medications (verified) Outpatient Encounter Medications as of 09/18/2024  Medication Sig   atorvastatin  (LIPITOR) 20 MG tablet Take 1 tablet (20 mg total) by mouth daily. for cholesterol   furosemide  (LASIX ) 40 MG tablet Take 1 tablet (40 mg total) by mouth 2 (two) times daily. For fluid build up   lisinopril  (ZESTRIL ) 5 MG tablet Take 1 tablet (5 mg total) by mouth daily. for blood pressure.   metoprolol  tartrate (LOPRESSOR ) 25 MG tablet Take 1 tablet (25 mg total) by mouth 2 (two) times daily.   omeprazole  (PRILOSEC) 20 MG capsule Take 1 capsule (20 mg total) by mouth 2 (two) times daily before a meal for heartburn.   potassium chloride  (KLOR-CON  M) 10 MEQ tablet Take 2 tablets (20 mEq total) by mouth 2 (two) times daily.   SUMAtriptan  (IMITREX ) 100 MG tablet TAKE 1 TABLET BY MOUTH AT MIGRAINE ONSET,MAY REPEAT IN 2 HOURS IF HEADACHE PERSISTS/RECURS   tamsulosin  (FLOMAX ) 0.4 MG CAPS capsule Take 1 capsule (0.4 mg total) by mouth daily.   No facility-administered encounter medications on file as of 09/18/2024.    History: Past Medical History:  Diagnosis Date   Acute conjunctivitis of both eyes 02/20/2022    Bilateral lower extremity edema    Cancer (HCC)    COLONIC POLYPS, HX OF 08/31/2007   Qualifier: Diagnosis of  By: Jimmy MD, Charlie Scarlet    DIVERTICULITIS, HX OF 08/31/2007   Qualifier: Diagnosis of  By: Edmond CMA (AAMA), Natasha     DIVERTICULOSIS, COLON 08/31/2007   Qualifier: Diagnosis of  By: Jimmy MD, Charlie Scarlet    Dyspnea    Related to Covid   Gallbladder problem    GERD (gastroesophageal reflux disease)    History of kidney stones    Hx of radiation therapy 04/11/2019   Pneumonia due to COVID-19 virus 04/19/2019   Prostate cancer (HCC)    RENAL CALCULUS, HX OF 08/31/2007   Qualifier: Diagnosis of  By: Edmond CMA (AAMA), Natasha     Shortness of breath 02/21/2020   Sleep apnea    Swallowing difficulty    Syncope, near 03/27/2022   Past Surgical History:  Procedure Laterality Date   APPENDECTOMY  1987   CHOLECYSTECTOMY  1987   RADIOACTIVE SEED IMPLANT N/A 06/27/2019   Procedure: RADIOACTIVE SEED IMPLANT/BRACHYTHERAPY IMPLANT;  Surgeon: Twylla Glendia BROCKS, MD;  Location: [facility] ORS;  Service: Urology;  Laterality: N/A;   Family History  Problem Relation Age of Onset   Early death Father    Kidney disease Father 69   Heart failure Sister    Suicidality Maternal Grandfather    Heart failure Paternal Grandfather    Social History   Occupational History   Not on file  Tobacco Use   Smoking status: Former    Current packs/day: 0.00    Average packs/day: 1.5 packs/day for 10.0 years (15.0 ttl pk-yrs)    Types: Cigarettes  Start date: 09/28/1974    Quit date: 09/28/1984    Years since quitting: 40.0   Smokeless tobacco: Never  Vaping Use   Vaping status: Never Used  Substance and Sexual Activity   Alcohol use: Not Currently   Drug use: Never   Sexual activity: Yes   Tobacco Counseling Counseling given: Not Answered  SDOH Screenings   Food Insecurity: No Food Insecurity (09/15/2024)  Housing: Low Risk (09/15/2024)  Transportation Needs: No Transportation  Needs (09/15/2024)  Utilities: Not At Risk (09/18/2024)  Alcohol Screen: Low Risk (09/15/2024)  Depression (PHQ2-9): Low Risk (09/18/2024)  Financial Resource Strain: Low Risk (09/15/2024)  Physical Activity: Sufficiently Active (09/15/2024)  Social Connections: Socially Integrated (09/15/2024)  Stress: No Stress Concern Present (09/15/2024)  Tobacco Use: Medium Risk (09/18/2024)  Health Literacy: Adequate Health Literacy (09/18/2024)   See flowsheets for full screening details  Depression Screen PHQ 2 & 9 Depression Scale- Over the past 2 weeks, how often have you been bothered by any of the following problems? Little interest or pleasure in doing things: 0 Feeling down, depressed, or hopeless (PHQ Adolescent also includes...irritable): 0 PHQ-2 Total Score: 0 Trouble falling or staying asleep, or sleeping too much: 0 Feeling tired or having little energy: 0 Poor appetite or overeating (PHQ Adolescent also includes...weight loss): 0 Feeling bad about yourself - or that you are a failure or have let yourself or your family down: 0 Trouble concentrating on things, such as reading the newspaper or watching television (PHQ Adolescent also includes...like school work): 0 Moving or speaking so slowly that other people could have noticed. Or the opposite - being so fidgety or restless that you have been moving around a lot more than usual: 0 Thoughts that you would be better off dead, or of hurting yourself in some way: 0 PHQ-9 Total Score: 0 If you checked off any problems, how difficult have these problems made it for you to do your work, take care of things at home, or get along with other people?: Not difficult at all  Depression Treatment Depression Interventions/Treatment : EYV7-0 Score <4 Follow-up Not Indicated     Goals Addressed               This Visit's Progress     Patient Stated (pt-stated)        Patient would like to lose some weight              Objective:     Today's Vitals   09/18/24 0819  BP: 116/63  Weight: 207 lb (93.9 kg)  Height: 4' 11 (1.499 m)   Body mass index is 41.81 kg/m.  Hearing/Vision screen Hearing Screening - Comments:: No difficulties  Vision Screening - Comments:: Patient states he has no difficulties. Patient will call back with the doctor name  Immunizations and Health Maintenance Health Maintenance  Topic Date Due   Zoster Vaccines- Shingrix (1 of 2) Never done   DTaP/Tdap/Td (2 - Tdap) 04/25/2013   Medicare Annual Wellness (AWV)  09/15/2024   Influenza Vaccine  12/26/2024 (Originally 04/28/2024)   Colonoscopy  09/28/2026   Pneumococcal Vaccine: 50+ Years  Completed   Hepatitis C Screening  Completed   Meningococcal B Vaccine  Aged Out   COVID-19 Vaccine  Discontinued        Assessment/Plan:  This is a routine wellness examination for [place].  Patient Care Team: Gretta Comer POUR, NP as PCP - General (Internal Medicine) Darliss Rogue, MD as PCP - Cardiology (Cardiology) Kennyth Chew, MD  as PCP - Electrophysiology (Cardiology) Vertell Pont, RN as Oncology Nurse Navigator Charmayne Molly, MD as Consulting Physician (Ophthalmology) Tina Pauletta BROCKS, MD as Consulting Physician (Oncology) Twylla Glendia BROCKS, MD (Urology)  I have personally reviewed and noted the following in the patients chart:   Medical and social history Use of alcohol, tobacco or illicit drugs  Current medications and supplements including opioid prescriptions. Functional ability and status Nutritional status Physical activity Advanced directives List of other physicians Hospitalizations, surgeries, and ER visits in previous 12 months Vitals Screenings to include cognitive, depression, and falls Referrals and appointments  No orders of the defined types were placed in this encounter.  In addition, I have reviewed and discussed with patient certain preventive protocols, quality metrics, and best practice recommendations. A  written personalized care plan for preventive services as well as general preventive health recommendations were provided to patient.   Lyle MARLA Right, [place]   09/18/2024   No follow-ups on file.  After Visit Summary: (MyChart) Due to this being a telephonic visit, the after visit summary with patients personalized plan was offered to patient via MyChart   Nurse Notes: voiced no concerns , patient declined vaccines  "

## 2024-09-27 ENCOUNTER — Ambulatory Visit: Payer: Self-pay

## 2024-09-27 NOTE — Telephone Encounter (Signed)
 FYI Only or Action Required?: FYI only for provider: appointment scheduled on 10/02/2024 at 11 AM.  Patient was last seen in primary care on 07/17/2024 by Gretta Comer POUR, NP.  Called Nurse Triage reporting Cough.  Symptoms began several weeks ago.  Interventions attempted: Rest, hydration, or home remedies.  Symptoms are: unchanged.  Triage Disposition: See PCP When Office is Open (Within 3 Days)  Patient/caregiver understands and will follow disposition?: Yes  Copied from CRM #[id number]. Topic: Clinical - Red Word Triage >> Sep 27, 2024  8:56 AM Rea ORN wrote: Red Word that prompted transfer to Nurse Triage: productive cough, congestion Reason for Disposition  [1] Nasal discharge AND [2] present > 10 days  Answer Assessment - Initial Assessment Questions Two weeks of a productive cough. Provider sent patient a Mychart message with OTC options to help with cough. Patient was scheduled to see a provider on Monday 10/02/2024  1. ONSET: When did the cough begin?      Started two weeks ago 2. SEVERITY: How bad is the cough today?      Mild to moderate currently 3. SPUTUM: Describe the color of your sputum (e.g., none, dry cough; clear, white, yellow, green)     yellow 4. HEMOPTYSIS: Are you coughing up any blood? If Yes, ask: How much? (e.g., flecks, streaks, tablespoons, etc.)     no 5. DIFFICULTY BREATHING: Are you having difficulty breathing? If Yes, ask: How bad is it? (e.g., mild, moderate, severe)      no 6. FEVER: Do you have a fever? If Yes, ask: What is your temperature, how was it measured, and when did it start?     no 7. CARDIAC HISTORY: Do you have any history of heart disease? (e.g., heart attack, congestive heart failure)      yes 8. LUNG HISTORY: Do you have any history of lung disease?  (e.g., pulmonary embolus, asthma, emphysema)     no 9. PE RISK FACTORS: Do you have a history of blood clots? (or: recent major surgery, recent prolonged  travel, bedridden)     no 10. OTHER SYMPTOMS: Do you have any other symptoms? (e.g., runny nose, wheezing, chest pain)       congestion 11. TRAVEL: Have you traveled out of the country in the last month? (e.g., travel history, exposures)       no  Protocols used: Cough - Acute Productive-A-AH

## 2024-09-27 NOTE — Telephone Encounter (Signed)
 Noted. Agree with nursing triage decision. Appreciate Dr Gutierrez's evaluation.

## 2024-09-29 ENCOUNTER — Other Ambulatory Visit: Payer: Self-pay

## 2024-10-02 ENCOUNTER — Other Ambulatory Visit (HOSPITAL_COMMUNITY): Payer: Self-pay

## 2024-10-02 ENCOUNTER — Encounter: Payer: Self-pay | Admitting: Family Medicine

## 2024-10-02 ENCOUNTER — Ambulatory Visit (INDEPENDENT_AMBULATORY_CARE_PROVIDER_SITE_OTHER): Admitting: Family Medicine

## 2024-10-02 ENCOUNTER — Other Ambulatory Visit: Payer: Self-pay

## 2024-10-02 VITALS — BP 120/86 | HR 78 | Temp 98.0°F | Ht 59.0 in | Wt 213.2 lb

## 2024-10-02 DIAGNOSIS — I4729 Other ventricular tachycardia: Secondary | ICD-10-CM | POA: Diagnosis not present

## 2024-10-02 DIAGNOSIS — R052 Subacute cough: Secondary | ICD-10-CM | POA: Diagnosis not present

## 2024-10-02 DIAGNOSIS — G4733 Obstructive sleep apnea (adult) (pediatric): Secondary | ICD-10-CM

## 2024-10-02 DIAGNOSIS — R059 Cough, unspecified: Secondary | ICD-10-CM | POA: Insufficient documentation

## 2024-10-02 MED ORDER — DOXYCYCLINE HYCLATE 100 MG PO TABS
100.0000 mg | ORAL_TABLET | Freq: Two times a day (BID) | ORAL | 0 refills | Status: DC
  Filled 2024-10-02: qty 20, 10d supply, fill #0

## 2024-10-02 MED ORDER — BENZONATATE 100 MG PO CAPS
100.0000 mg | ORAL_CAPSULE | Freq: Three times a day (TID) | ORAL | 0 refills | Status: DC | PRN
  Filled 2024-10-02: qty 30, 10d supply, fill #0

## 2024-10-02 NOTE — Patient Instructions (Addendum)
 I recommend treating for bacterial sinusitis with doxycycline  course  Push fluids and plenty of rest.  Start flonase  nasal steroid for sinus inflammation.  If antibiotic course doesn't resolve cough, we should consider switching from lisinopril  to medicine such as losartan.  Nice to see you today!

## 2024-10-02 NOTE — Assessment & Plan Note (Signed)
 Now on metoprolol  25mg  bid with EP f/u planned next week

## 2024-10-02 NOTE — Assessment & Plan Note (Signed)
 Continues nightly auto-CPAP however has had more difficulty recently due to above

## 2024-10-02 NOTE — Assessment & Plan Note (Signed)
 Ongoing 3 wks with head congestion and production of colored mucous suspicious for possible acute bacterial sinusitis - therefore will treat with 10d doxycycline  course with photosensitivity precautions. Rx tessalon  perls as well PRN cough. He may continue OTC cough syrup.  Update if not improving, to then consider ACEI- induced cough as contributor.  He will touch base with EP about this.

## 2024-10-02 NOTE — Progress Notes (Signed)
 " Ph: [phone] Fax: [phone]   Patient ID: Carlin Gerome Loreli DOUGLAS, male    DOB: 1950/12/21, 74 y.o.   MRN: [id number]  This visit was conducted in person.  BP 120/86   Pulse 78   Temp 98 F (36.7 C) (Oral)   Ht 4' 11 (1.499 m)   Wt 213 lb 3.2 oz (96.7 kg)   SpO2 94%   BMI 43.06 kg/m    CC: cough x3 wks  Subjective:   HPI: Adetokunbo Mccadden III is a 74 y.o. male presenting on 10/02/2024 for Acute Visit (Productive cough, yellow sputum, nasal discharge, congestion, SOB, onset 3 weeks//Pt has sleep apnea, this has made sleeping and breathing more difficult at night)    3 wk h/o productive cough of yellow sputum, nasal discharge and drainage, head congestion, dyspnea. Coughing fits with tickle to throat. R ear stopped up   No fever, body aches, no chest congestion, abd pain, nausea, tooth pain or ST  Has been treating with delsym , mucinex  with temporary improvement. He is on lisinopril  5mg  daily as well as metoprolol  25mg  bid - started early November for nonsustained SVT needing adenosine by EMS 06/2024, nonsustained VT, occ PVCs - planned EP f/u next week.   Trouble using autoCPAP due to these symptoms.  H/o dyspnea after COVID pneumonia 2020.      Relevant past medical, surgical, family and social history reviewed and updated as indicated. Interim medical history since our last visit reviewed. Allergies and medications reviewed and updated. Outpatient Medications Prior to Visit  Medication Sig Dispense Refill   atorvastatin  (LIPITOR) 20 MG tablet Take 1 tablet (20 mg total) by mouth daily. for cholesterol 90 tablet 2   furosemide  (LASIX ) 40 MG tablet Take 1 tablet (40 mg total) by mouth 2 (two) times daily. For fluid build up 180 tablet 0   lisinopril  (ZESTRIL ) 5 MG tablet Take 1 tablet (5 mg total) by mouth daily. for blood pressure. 90 tablet 2   metoprolol  tartrate (LOPRESSOR ) 25 MG tablet Take 1 tablet (25 mg total) by mouth 2 (two) times daily. 90 tablet 3    omeprazole  (PRILOSEC) 20 MG capsule Take 1 capsule (20 mg total) by mouth 2 (two) times daily before a meal for heartburn. 180 capsule 1   potassium chloride  (KLOR-CON  M) 10 MEQ tablet Take 2 tablets (20 mEq total) by mouth 2 (two) times daily. 120 tablet 2   SUMAtriptan  (IMITREX ) 100 MG tablet TAKE 1 TABLET BY MOUTH AT MIGRAINE ONSET,MAY REPEAT IN 2 HOURS IF HEADACHE PERSISTS/RECURS 10 tablet 0   tamsulosin  (FLOMAX ) 0.4 MG CAPS capsule Take 1 capsule (0.4 mg total) by mouth daily. 90 capsule 2   No facility-administered medications prior to visit.     Per HPI unless specifically indicated in ROS section below Review of Systems  Objective:  BP 120/86   Pulse 78   Temp 98 F (36.7 C) (Oral)   Ht 4' 11 (1.499 m)   Wt 213 lb 3.2 oz (96.7 kg)   SpO2 94%   BMI 43.06 kg/m   Wt Readings from Last 3 Encounters:  10/02/24 213 lb 3.2 oz (96.7 kg)  09/18/24 207 lb (93.9 kg)  09/08/24 214 lb (97.1 kg)      Physical Exam Vitals and nursing note reviewed.  Constitutional:      Appearance: Normal appearance. He is not ill-appearing.  HENT:     Head: Normocephalic and atraumatic.     Right Ear: Hearing, tympanic membrane, ear  canal and external ear normal. There is no impacted cerumen. Tympanic membrane is not erythematous.     Left Ear: Hearing, ear canal and external ear normal. There is no impacted cerumen. Tympanic membrane is retracted. Tympanic membrane is not erythematous.     Nose: Mucosal edema (and erythema) and congestion present. No rhinorrhea.     Right Turbinates: Not enlarged or swollen.     Left Turbinates: Not enlarged or swollen.     Right Sinus: No maxillary sinus tenderness or frontal sinus tenderness.     Left Sinus: No maxillary sinus tenderness or frontal sinus tenderness.     Mouth/Throat:     Mouth: Mucous membranes are moist.     Pharynx: Oropharynx is clear. No oropharyngeal exudate or posterior oropharyngeal erythema.  Eyes:     Extraocular Movements:  Extraocular movements intact.     Conjunctiva/sclera: Conjunctivae normal.     Pupils: Pupils are equal, round, and reactive to light.  Cardiovascular:     Rate and Rhythm: Normal rate and regular rhythm.     Pulses: Normal pulses.     Heart sounds: Normal heart sounds. No murmur heard. Pulmonary:     Effort: Pulmonary effort is normal. No respiratory distress.     Breath sounds: Normal breath sounds. No wheezing, rhonchi or rales.     Comments: Lungs clear  Musculoskeletal:     Cervical back: Normal range of motion and neck supple. No rigidity.     Right lower leg: No edema.     Left lower leg: No edema.  Lymphadenopathy:     Cervical: No cervical adenopathy.  Skin:    General: Skin is warm and dry.     Findings: No rash.  Neurological:     Mental Status: He is alert.  Psychiatric:        Mood and Affect: Mood normal.        Behavior: Behavior normal.        Assessment & Plan:   Problem List Items Addressed This Visit     OSA (obstructive sleep apnea)   Continues nightly auto-CPAP however has had more difficulty recently due to above      Cough - Primary   Ongoing 3 wks with head congestion and production of colored mucous suspicious for possible acute bacterial sinusitis - therefore will treat with 10d doxycycline  course with photosensitivity precautions. Rx tessalon  perls as well PRN cough. He may continue OTC cough syrup.  Update if not improving, to then consider ACEI- induced cough as contributor.  He will touch base with EP about this.       NSVT (nonsustained ventricular tachycardia) (HCC)   Now on metoprolol  25mg  bid with EP f/u planned next week        Meds ordered this encounter  Medications   doxycycline  (VIBRA -TABS) 100 MG tablet    Sig: Take 1 tablet (100 mg total) by mouth 2 (two) times daily.    Dispense:  20 tablet    Refill:  0   benzonatate  (TESSALON ) 100 MG capsule    Sig: Take 1 capsule (100 mg total) by mouth 3 (three) times daily as  needed for cough.    Dispense:  30 capsule    Refill:  0    No orders of the defined types were placed in this encounter.   Patient Instructions  I recommend treating for bacterial sinusitis with doxycycline  course  Push fluids and plenty of rest.  Start flonase  nasal steroid for sinus inflammation.  If antibiotic course doesn't resolve cough, we should consider switching from lisinopril  to medicine such as losartan.  Nice to see you today!   Follow up plan: Return if symptoms worsen or fail to improve.  Anton Blas, MD   "

## 2024-10-09 NOTE — Progress Notes (Unsigned)
 " Electrophysiology Office Note:   Date:  10/11/2024  ID:  Preston Thomas, DOB [birth year], MRN [id number]  Primary Cardiologist: Redell Cave, MD Electrophysiologist: Fonda Kitty, MD      History of Present Illness:   Preston Thomas is a 74 y.o. male with h/o prostate cancer (currently on immunotherapy, last IM administration in July), HTN, HLD, OSA on CPAP, remote history of hypoxemic respiratory failure due to COVID (2020, required supplemental O2 for a brief period thereafter but not currently on chronic O2)  who is being seen today for SVT management.  Per triage notation from OSH ED note: 911 called out for shortness of breath, when they arrived pt was 78 % on room air and wheezy . Pt was in SVT per ems and was given 6mg  of adenosine and converted this was given at 0030.   Discussed the use of AI scribe software for clinical note transcription with the patient, who gave verbal consent to proceed.  History of Present Illness Preston Thomas is a 74 year old male who presents with an episode of supraventricular tachycardia (SVT). He was referred by Redell for evaluation of his recent SVT episode.  He experienced an episode of supraventricular tachycardia (SVT) while [place] the beach. A few days prior, he noticed easy exertion, which he initially attributed to fatigue from his cancer medication. The episode occurred while sitting and watching television, during which he developed labored breathing that progressively worsened. He did not initially notice his heart rate but was aware of the difficulty in breathing. Emergency services arrived promptly, and his oxygen  saturation was noted to be 77%. After oxygen  administration, his saturation improved. In the ambulance, his heart rate was recorded at approximately 200 beats per minute, and he was administered adenosine, which immediately reduced his heart rate. He was subsequently hospitalized, treated with  Lasix , and later discharged on blood pressure medications and Lasix .  Since the episode, he has been on metoprolol  and has not experienced any further prolonged episodes of SVT. He wore a monitor that recorded 42 short episodes over 14 days, none lasting more than a few seconds, and he has not felt any symptoms during these short episodes.  His oncologist had previously noted that his cancer medication could have cardiac side effects, and he was experiencing fluid retention, evidenced by puffy ankles. He was taken off the cancer medication in October, and his cancer readings have been stable since then. He recalls a cardiologist questioning his sodium intake during the episode, as he had consumed a meal with potentially high sodium content, which could have contributed to fluid retention.    Review of systems complete and found to be negative unless listed in HPI.   EP Information / Studies Reviewed:    EKG is not ordered today. EKG from 07/14/24 reviewed which showed sinus rhythm, PR and QRS 80ms.      Echo 07/10/24:  Conclusion  Ejection Fraction = 55-60%.  The left atrium is mildly dilated.  There is trace mitral regurgitation.  There is trace tricuspid regurgitation.  The inferior vena cava is normal in size, with a normal collapsibility index.       Physical Exam:   VS:  BP 126/60 (BP Location: Left Arm, Patient Position: Sitting, Cuff Size: Normal)   Pulse 76   Ht 5' (1.524 m)   Wt 215 lb 9.6 oz (97.8 kg)   SpO2 96%   BMI 42.11 kg/m    Wt Readings  from Last 3 Encounters:  10/10/24 215 lb 9.6 oz (97.8 kg)  10/02/24 213 lb 3.2 oz (96.7 kg)  09/18/24 207 lb (93.9 kg)     General: Well developed, in no acute distress.  Neck: No JVD.  Cardiac: Normal rate, regular rhythm.  Resp: Normal work of breathing.  Ext: No edema.  Neuro: No gross focal deficits.  Psych: Normal affect.   ASSESSMENT AND PLAN:    #SVT: Tracings not available but documented in ED note from  ECU. Reportedly lasted 15 minutes with rates of 200bpm. Terminated with adenosine. No sustained episodes on Zio while on metoprolol . #Palpitations - Doing well on metoprolol  without know recurrence of sustained SVT. Will continue metoprolol  tartrate 25mg  BID for now. Discussed vagal maneuvers.    #Hypertension -At goal today.  Recommend checking blood pressures 1-2 times per week at home and recording the values.  Recommend bringing these recordings to the primary care physician.   Follow up with EP Team in 6 months. If doing well on metoprolol  with no recurrence then EP as needed moving forward.  Signed, Fonda Kitty, MD  "

## 2024-10-10 ENCOUNTER — Ambulatory Visit: Payer: Self-pay | Attending: Cardiology | Admitting: Cardiology

## 2024-10-10 ENCOUNTER — Ambulatory Visit: Payer: Self-pay | Admitting: Cardiology

## 2024-10-10 ENCOUNTER — Encounter: Payer: Self-pay | Admitting: Cardiology

## 2024-10-10 VITALS — BP 126/60 | HR 76 | Ht 60.0 in | Wt 215.6 lb

## 2024-10-10 DIAGNOSIS — I10 Essential (primary) hypertension: Secondary | ICD-10-CM

## 2024-10-10 DIAGNOSIS — I471 Supraventricular tachycardia, unspecified: Secondary | ICD-10-CM

## 2024-10-10 DIAGNOSIS — R002 Palpitations: Secondary | ICD-10-CM

## 2024-10-10 NOTE — Patient Instructions (Signed)
 Medication Instructions:  Your physician recommends that you continue on your current medications as directed. Please refer to the Current Medication list given to you today.  *If you need a refill on your cardiac medications before your next appointment, please call your pharmacy*  Follow-Up: At [facility], you and your health needs are our priority.  As part of our continuing mission to provide you with exceptional heart care, our providers are all part of one team.  This team includes your primary Cardiologist (physician) and Advanced Practice Providers or APPs (Physician Assistants and Nurse Practitioners) who all work together to provide you with the care you need, when you need it.  Your next appointment:   6 months  Provider:   Fonda Kitty, MD or Suzann Riddle, NP

## 2024-10-12 ENCOUNTER — Encounter (INDEPENDENT_AMBULATORY_CARE_PROVIDER_SITE_OTHER): Payer: Self-pay | Admitting: Physician Assistant

## 2024-10-13 ENCOUNTER — Ambulatory Visit: Payer: Self-pay | Attending: Cardiology | Admitting: Cardiology

## 2024-10-13 ENCOUNTER — Encounter: Payer: Self-pay | Admitting: Cardiology

## 2024-10-13 VITALS — BP 110/66 | HR 77 | Ht 59.0 in | Wt 216.2 lb

## 2024-10-13 DIAGNOSIS — I10 Essential (primary) hypertension: Secondary | ICD-10-CM | POA: Diagnosis not present

## 2024-10-13 DIAGNOSIS — I471 Supraventricular tachycardia, unspecified: Secondary | ICD-10-CM | POA: Diagnosis not present

## 2024-10-13 NOTE — Patient Instructions (Signed)

## 2024-10-13 NOTE — Progress Notes (Signed)
 " Cardiology Office Note:    Date:  10/13/2024   ID:  Preston Thomas, DOB 06/29/1951, MRN [id number]  PCP:  Gretta Comer POUR, NP   [facility] Providers Cardiologist:  Redell Cave, MD Electrophysiologist:  Fonda Kitty, MD     Referring MD: Gretta Comer POUR, NP   Chief Complaint  Patient presents with   Follow-up    3 month follow up visit. Patient is doing well on today. Meds reviewed.     History of Present Illness:    Preston Thomas is a 74 y.o. male with a hx of hypertension, SVT requiring adenosine 10/25, GERD, morbid obesity, OSA/CPAP prostate cancer  who presents for hospital follow-up.  Being seen due to history of SVT requiring adenosine.  Lopressor  12.5 mg twice daily was started.  Doing okay since last meeting, denies palpitations, dizziness.  Cardiac monitor was placed showed nonsustained SVT longest lasting 18 beats.  Lopressor  was increased to 25 mg twice daily.  Patient feels well, has no concerns at this time.  Prior notes/testing Echo 07/17/2024 EF 55 to 60% Echo 05/2022 showed normal EF 55 to 60%. Admitted 10/25 at [facility] due to shortness of breath, diagnosed with SVT requiring adenosine 6 mg.  Past Medical History:  Diagnosis Date   Acute conjunctivitis of both eyes 02/20/2022   Bilateral lower extremity edema    Cancer (HCC)    COLONIC POLYPS, HX OF 08/31/2007   Qualifier: Diagnosis of  By: Jimmy MD, Charlie Scarlet    DIVERTICULITIS, HX OF 08/31/2007   Qualifier: Diagnosis of  By: Edmond CMA (AAMA), Natasha     DIVERTICULOSIS, COLON 08/31/2007   Qualifier: Diagnosis of  By: Jimmy MD, Charlie Scarlet    Dyspnea    Related to Covid   Gallbladder problem    GERD (gastroesophageal reflux disease)    History of kidney stones    Hx of radiation therapy 04/11/2019   Pneumonia due to COVID-19 virus 04/19/2019   Prostate cancer (HCC)    RENAL CALCULUS, HX OF 08/31/2007   Qualifier: Diagnosis of  By: Edmond CMA (AAMA),  Natasha     Shortness of breath 02/21/2020   Sleep apnea    Swallowing difficulty    Syncope, near 03/27/2022    Past Surgical History:  Procedure Laterality Date   APPENDECTOMY  1987   CHOLECYSTECTOMY  1987   RADIOACTIVE SEED IMPLANT N/A 06/27/2019   Procedure: RADIOACTIVE SEED IMPLANT/BRACHYTHERAPY IMPLANT;  Surgeon: Twylla Glendia BROCKS, MD;  Location: [facility] ORS;  Service: Urology;  Laterality: N/A;    Current Medications: Current Meds  Medication Sig   atorvastatin  (LIPITOR) 20 MG tablet Take 1 tablet (20 mg total) by mouth daily. for cholesterol   benzonatate  (TESSALON ) 100 MG capsule Take 1 capsule (100 mg total) by mouth 3 (three) times daily as needed for cough.   doxycycline  (VIBRA -TABS) 100 MG tablet Take 1 tablet (100 mg total) by mouth 2 (two) times daily.   furosemide  (LASIX ) 40 MG tablet Take 1 tablet (40 mg total) by mouth 2 (two) times daily. For fluid build up   lisinopril  (ZESTRIL ) 5 MG tablet Take 1 tablet (5 mg total) by mouth daily. for blood pressure.   metoprolol  tartrate (LOPRESSOR ) 25 MG tablet Take 1 tablet (25 mg total) by mouth 2 (two) times daily.   omeprazole  (PRILOSEC) 20 MG capsule Take 1 capsule (20 mg total) by mouth 2 (two) times daily before a meal for heartburn.   potassium chloride  (KLOR-CON  M) 10  MEQ tablet Take 2 tablets (20 mEq total) by mouth 2 (two) times daily.   SUMAtriptan  (IMITREX ) 100 MG tablet TAKE 1 TABLET BY MOUTH AT MIGRAINE ONSET,MAY REPEAT IN 2 HOURS IF HEADACHE PERSISTS/RECURS   tamsulosin  (FLOMAX ) 0.4 MG CAPS capsule Take 1 capsule (0.4 mg total) by mouth daily.     Allergies:   Penicillins   Social History   Socioeconomic History   Marital status: Married    Spouse name: Not on file   Number of children: Not on file   Years of education: Not on file   Highest education level: Bachelor's degree (e.g., BA, AB, BS)  Occupational History   Not on file  Tobacco Use   Smoking status: Former    Current packs/day: 0.00    Average  packs/day: 1.5 packs/day for 10.0 years (15.0 ttl pk-yrs)    Types: Cigarettes    Start date: 09/28/1974    Quit date: 09/28/1984    Years since quitting: 40.0   Smokeless tobacco: Never  Vaping Use   Vaping status: Never Used  Substance and Sexual Activity   Alcohol use: Not Currently   Drug use: Never   Sexual activity: Yes  Other Topics Concern   Not on file  Social History Narrative   Not on file   Social Drivers of Health   Tobacco Use: Medium Risk (10/13/2024)   Patient History    Smoking Tobacco Use: Former    Smokeless Tobacco Use: Never    Passive Exposure: Not on Actuary Strain: Low Risk (09/15/2024)   Overall Financial Resource Strain (CARDIA)    Difficulty of Paying Living Expenses: Not hard at all  Food Insecurity: No Food Insecurity (09/15/2024)   Epic    Worried About Radiation Protection Practitioner of Food in the Last Year: Never true    Ran Out of Food in the Last Year: Never true  Transportation Needs: No Transportation Needs (09/15/2024)   Epic    Lack of Transportation (Medical): No    Lack of Transportation (Non-Medical): No  Physical Activity: Sufficiently Active (09/15/2024)   Exercise Vital Sign    Days of Exercise per Week: 4 days    Minutes of Exercise per Session: 60 min  Stress: No Stress Concern Present (09/15/2024)   [employer] of Occupational Health - Occupational Stress Questionnaire    Feeling of Stress: Not at all  Social Connections: Socially Integrated (09/15/2024)   Social Connection and Isolation Panel    Frequency of Communication with Friends and Family: More than three times a week    Frequency of Social Gatherings with Friends and Family: Twice a week    Attends Religious Services: 1 to 4 times per year    Active Member of Clubs or Organizations: Yes    Attends Banker Meetings: 1 to 4 times per year    Marital Status: Married  Depression (PHQ2-9): Low Risk (10/02/2024)   Depression (PHQ2-9)    PHQ-2 Score: 0   Alcohol Screen: Low Risk (09/15/2024)   Alcohol Screen    Last Alcohol Screening Score (AUDIT): 1  Housing: Low Risk (09/15/2024)   Epic    Unable to Pay for Housing in the Last Year: No    Number of Times Moved in the Last Year: 0    Homeless in the Last Year: No  Utilities: Not At Risk (09/18/2024)   Epic    Threatened with loss of utilities: No  Health Literacy: Adequate Health Literacy (09/18/2024)   B1300  Health Literacy    Frequency of need for help with medical instructions: Never     Family History: The patient's family history includes Early death in his father; Heart failure in his paternal grandfather and sister; Kidney disease (age of onset: 54) in his father; Suicidality in his maternal grandfather.  ROS:   Please see the history of present illness.     All other systems reviewed and are negative.  EKGs/Labs/Other Studies Reviewed:    The following studies were reviewed today:       Recent Labs: 04/20/2024: Magnesium 2.0 09/06/2024: ALT 29; BUN 21; Creatinine 1.10; Hemoglobin 13.5; Platelet Count 354; Potassium 3.9; Sodium 146  Recent Lipid Panel    Component Value Date/Time   CHOL 132 09/01/2023 0853   TRIG 75 09/01/2023 0853   HDL 36 (L) 09/01/2023 0853   CHOLHDL 4 12/19/2021 1246   VLDL 31.8 12/19/2021 1246   LDLCALC 81 09/01/2023 0853   LDLDIRECT 167.0 05/15/2019 1100     Risk Assessment/Calculations:              Physical Exam:    VS:  BP 110/66 (BP Location: Left Arm, Patient Position: Sitting, Cuff Size: Large)   Pulse 77   Ht 4' 11 (1.499 m)   Wt 216 lb 3.2 oz (98.1 kg)   SpO2 97%   BMI 43.67 kg/m     Wt Readings from Last 3 Encounters:  10/13/24 216 lb 3.2 oz (98.1 kg)  10/10/24 215 lb 9.6 oz (97.8 kg)  10/02/24 213 lb 3.2 oz (96.7 kg)     GEN:  Well nourished, well developed in no acute distress HEENT: Normal NECK: No JVD; No carotid bruits CARDIAC: RRR, no murmurs, rubs, gallops RESPIRATORY:  Clear to auscultation  without rales, wheezing or rhonchi  ABDOMEN: Soft, non-tender, non-distended MUSCULOSKELETAL:  No edema; No deformity  SKIN: Warm and dry NEUROLOGIC:  Alert and oriented x 3 PSYCHIATRIC:  Normal affect   ASSESSMENT:    1. SVT (supraventricular tachycardia)   2. Primary hypertension    PLAN:    In order of problems listed above:  SVT, needing adenosine per EMS 10/25.  Follow-up cardiac monitor showed nonsustained SVT up to 18 beats.  No sustained arrhythmias.  Lopressor  dose increased.  Continue Lopressor  25 mg twice daily.   Hypertension, BP controlled.  Continue Lopressor  25 mg twice daily, lisinopril  5 mg daily.  Follow-up in 6 months.     Medication Adjustments/Labs and Tests Ordered: Current medicines are reviewed at length with the patient today.  Concerns regarding medicines are outlined above.  No orders of the defined types were placed in this encounter.  No orders of the defined types were placed in this encounter.   Patient Instructions  Medication Instructions:  Your physician recommends that you continue on your current medications as directed. Please refer to the Current Medication list given to you today.   *If you need a refill on your cardiac medications before your next appointment, please call your pharmacy*  Lab Work: No labs ordered today  If you have labs (blood work) drawn today and your tests are completely normal, you will receive your results only by: MyChart Message (if you have MyChart) OR A paper copy in the mail If you have any lab test that is abnormal or we need to change your treatment, we will call you to review the results.  Testing/Procedures: No test ordered today   Follow-Up: At [facility], you and your health needs  are our priority.  As part of our continuing mission to provide you with exceptional heart care, our providers are all part of one team.  This team includes your primary Cardiologist (physician) and Advanced  Practice Providers or APPs (Physician Assistants and Nurse Practitioners) who all work together to provide you with the care you need, when you need it.  Your next appointment:   6 month(s)  Provider:   You may see Redell Cave, MD or one of the following Advanced Practice Providers on your designated Care Team:   Lonni Meager, NP Lesley Maffucci, PA-C Bernardino Bring, PA-C Cadence [place], PA-C Tylene Lunch, NP Barnie Hila, NP    We recommend signing up for the patient portal called MyChart.  Sign up information is provided on this After Visit Summary.  MyChart is used to connect with patients for Virtual Visits (Telemedicine).  Patients are able to view lab/test results, encounter notes, upcoming appointments, etc.  Non-urgent messages can be sent to your provider as well.   To learn more about what you can do with MyChart, go to [email or web address].              Signed, Redell Cave, MD  10/13/2024 12:27 PM    [facility] "

## 2024-10-15 ENCOUNTER — Encounter: Payer: Self-pay | Admitting: Family Medicine

## 2024-10-16 ENCOUNTER — Encounter (INDEPENDENT_AMBULATORY_CARE_PROVIDER_SITE_OTHER): Payer: Self-pay | Admitting: Physician Assistant

## 2024-10-18 MED ORDER — BENZONATATE 100 MG PO CAPS
100.0000 mg | ORAL_CAPSULE | Freq: Three times a day (TID) | ORAL | 0 refills | Status: AC | PRN
  Filled 2024-10-18: qty 30, 10d supply, fill #0

## 2024-10-19 ENCOUNTER — Other Ambulatory Visit: Payer: Self-pay

## 2024-10-19 NOTE — Telephone Encounter (Signed)
Noted, thanks for seeing him

## 2024-10-20 ENCOUNTER — Ambulatory Visit: Admitting: Cardiology

## 2024-10-27 ENCOUNTER — Other Ambulatory Visit: Payer: Self-pay | Admitting: Primary Care

## 2024-10-27 ENCOUNTER — Other Ambulatory Visit: Payer: Self-pay

## 2024-10-27 DIAGNOSIS — R600 Localized edema: Secondary | ICD-10-CM

## 2024-10-27 MED ORDER — FUROSEMIDE 40 MG PO TABS
40.0000 mg | ORAL_TABLET | Freq: Two times a day (BID) | ORAL | 0 refills | Status: AC
  Filled 2024-10-27: qty 180, 90d supply, fill #0

## 2024-10-27 NOTE — Telephone Encounter (Signed)
 Patient is due for CPE/follow up, this will be required prior to any further refills.  Please schedule, thank you!

## 2024-11-02 NOTE — Progress Notes (Signed)
 Preston Thomas                                          MRN: [id number]   11/02/2024   The [facility] Quality Team Specialist reviewed this patient medical record for the purposes of chart review for care gap closure. The following were reviewed: chart review for care gap closure-kidney health evaluation for diabetes:eGFR  and uACR.    [facility]BCI Quality Team

## 2024-11-09 ENCOUNTER — Inpatient Hospital Stay: Payer: Self-pay

## 2024-11-10 ENCOUNTER — Inpatient Hospital Stay: Payer: Self-pay

## 2024-12-12 ENCOUNTER — Ambulatory Visit: Admitting: Urology

## 2024-12-14 ENCOUNTER — Ambulatory Visit: Admitting: Urology
